# Patient Record
Sex: Male | Born: 1940 | Race: White | Hispanic: No | Smoking: Former smoker
Health system: Southern US, Community
[De-identification: ages and names within clinical notes are randomized; demographics above are authoritative.]

## PROBLEM LIST (undated history)

## (undated) ENCOUNTER — Emergency Department (HOSPITAL_COMMUNITY)

## (undated) DIAGNOSIS — E119 Type 2 diabetes mellitus without complications: Secondary | ICD-10-CM

## (undated) DIAGNOSIS — I219 Acute myocardial infarction, unspecified: Secondary | ICD-10-CM

## (undated) DIAGNOSIS — I214 Non-ST elevation (NSTEMI) myocardial infarction: Secondary | ICD-10-CM

## (undated) DIAGNOSIS — E7800 Pure hypercholesterolemia, unspecified: Secondary | ICD-10-CM

## (undated) DIAGNOSIS — I2510 Atherosclerotic heart disease of native coronary artery without angina pectoris: Secondary | ICD-10-CM

## (undated) DIAGNOSIS — Z950 Presence of cardiac pacemaker: Secondary | ICD-10-CM

## (undated) DIAGNOSIS — I10 Essential (primary) hypertension: Secondary | ICD-10-CM

## (undated) HISTORY — DX: Atherosclerotic heart disease of native coronary artery without angina pectoris: I25.10

## (undated) HISTORY — PX: COLONOSCOPY: SHX174

## (undated) HISTORY — DX: Non-ST elevation (NSTEMI) myocardial infarction: I21.4

## (undated) HISTORY — PX: CORONARY ANGIOPLASTY: SHX604

## (undated) HISTORY — PX: CORONARY ANGIOPLASTY WITH STENT PLACEMENT: SHX49

## (undated) HISTORY — PX: INSERT / REPLACE / REMOVE PACEMAKER: SUR710

---

## 2005-07-26 HISTORY — PX: CORONARY ARTERY BYPASS GRAFT: SHX141

## 2006-02-02 DIAGNOSIS — Z951 Presence of aortocoronary bypass graft: Secondary | ICD-10-CM | POA: Insufficient documentation

## 2009-11-20 DIAGNOSIS — Z950 Presence of cardiac pacemaker: Secondary | ICD-10-CM | POA: Insufficient documentation

## 2015-02-03 ENCOUNTER — Encounter (HOSPITAL_COMMUNITY): Payer: Self-pay

## 2015-02-03 ENCOUNTER — Inpatient Hospital Stay (HOSPITAL_COMMUNITY)
Admission: EM | Admit: 2015-02-03 | Discharge: 2015-02-06 | DRG: 251 | Disposition: A | Discharge status: Home / Self Care | Payer: Medicare Other | Attending: Internal Medicine | Admitting: Internal Medicine

## 2015-02-03 ENCOUNTER — Emergency Department (HOSPITAL_COMMUNITY): Payer: Medicare Other

## 2015-02-03 DIAGNOSIS — Z8679 Personal history of other diseases of the circulatory system: Secondary | ICD-10-CM

## 2015-02-03 DIAGNOSIS — E119 Type 2 diabetes mellitus without complications: Secondary | ICD-10-CM | POA: Diagnosis present

## 2015-02-03 DIAGNOSIS — I10 Essential (primary) hypertension: Secondary | ICD-10-CM | POA: Insufficient documentation

## 2015-02-03 DIAGNOSIS — Z955 Presence of coronary angioplasty implant and graft: Secondary | ICD-10-CM | POA: Diagnosis not present

## 2015-02-03 DIAGNOSIS — I480 Paroxysmal atrial fibrillation: Secondary | ICD-10-CM | POA: Diagnosis present

## 2015-02-03 DIAGNOSIS — Z9861 Coronary angioplasty status: Secondary | ICD-10-CM | POA: Diagnosis not present

## 2015-02-03 DIAGNOSIS — I495 Sick sinus syndrome: Secondary | ICD-10-CM

## 2015-02-03 DIAGNOSIS — I25810 Atherosclerosis of coronary artery bypass graft(s) without angina pectoris: Secondary | ICD-10-CM | POA: Diagnosis present

## 2015-02-03 DIAGNOSIS — I209 Angina pectoris, unspecified: Secondary | ICD-10-CM

## 2015-02-03 DIAGNOSIS — I2510 Atherosclerotic heart disease of native coronary artery without angina pectoris: Secondary | ICD-10-CM | POA: Diagnosis present

## 2015-02-03 DIAGNOSIS — E785 Hyperlipidemia, unspecified: Secondary | ICD-10-CM | POA: Diagnosis present

## 2015-02-03 DIAGNOSIS — Z950 Presence of cardiac pacemaker: Secondary | ICD-10-CM | POA: Diagnosis not present

## 2015-02-03 DIAGNOSIS — Z7902 Long term (current) use of antithrombotics/antiplatelets: Secondary | ICD-10-CM

## 2015-02-03 DIAGNOSIS — I214 Non-ST elevation (NSTEMI) myocardial infarction: Secondary | ICD-10-CM

## 2015-02-03 DIAGNOSIS — E782 Mixed hyperlipidemia: Secondary | ICD-10-CM | POA: Insufficient documentation

## 2015-02-03 DIAGNOSIS — Z87891 Personal history of nicotine dependence: Secondary | ICD-10-CM | POA: Diagnosis not present

## 2015-02-03 DIAGNOSIS — R079 Chest pain, unspecified: Secondary | ICD-10-CM | POA: Diagnosis present

## 2015-02-03 DIAGNOSIS — I2582 Chronic total occlusion of coronary artery: Secondary | ICD-10-CM | POA: Diagnosis present

## 2015-02-03 DIAGNOSIS — E118 Type 2 diabetes mellitus with unspecified complications: Secondary | ICD-10-CM | POA: Insufficient documentation

## 2015-02-03 DIAGNOSIS — Z7982 Long term (current) use of aspirin: Secondary | ICD-10-CM | POA: Diagnosis not present

## 2015-02-03 DIAGNOSIS — Z79899 Other long term (current) drug therapy: Secondary | ICD-10-CM

## 2015-02-03 HISTORY — DX: Essential (primary) hypertension: I10

## 2015-02-03 HISTORY — DX: Atherosclerotic heart disease of native coronary artery without angina pectoris: I25.10

## 2015-02-03 HISTORY — DX: Angina pectoris, unspecified: I20.9

## 2015-02-03 HISTORY — DX: Non-ST elevation (NSTEMI) myocardial infarction: I21.4

## 2015-02-03 LAB — GLUCOSE, CAPILLARY: Glucose-Capillary: 167 mg/dL — ABNORMAL HIGH (ref 65–99)

## 2015-02-03 LAB — I-STAT TROPONIN, ED: Troponin i, poc: 0.01 ng/mL (ref 0.00–0.08)

## 2015-02-03 LAB — BASIC METABOLIC PANEL
Anion gap: 12 (ref 5–15)
BUN: 21 mg/dL — ABNORMAL HIGH (ref 6–20)
CO2: 24 mmol/L (ref 22–32)
Calcium: 9.3 mg/dL (ref 8.9–10.3)
Chloride: 101 mmol/L (ref 101–111)
Creatinine, Ser: 1.14 mg/dL (ref 0.61–1.24)
GFR calc Af Amer: >60 mL/min (ref >60)
GFR calc non Af Amer: >60 mL/min (ref >60)
Glucose, Bld: 214 mg/dL — ABNORMAL HIGH (ref 65–99)
Potassium: 4.4 mmol/L (ref 3.5–5.1)
Sodium: 137 mmol/L (ref 135–145)

## 2015-02-03 LAB — CBC
HCT: 43.4 % (ref 39.0–52.0)
Hemoglobin: 15.3 g/dL (ref 13.0–17.0)
MCH: 32.8 pg (ref 26.0–34.0)
MCHC: 35.3 g/dL (ref 30.0–36.0)
MCV: 93.1 fL (ref 78.0–100.0)
Platelets: 123 10*3/uL — ABNORMAL LOW (ref 150–400)
RBC: 4.66 MIL/uL (ref 4.22–5.81)
RDW: 12.4 % (ref 11.5–15.5)
WBC: 5.3 10*3/uL (ref 4.0–10.5)

## 2015-02-03 LAB — PLATELET INHIBITION P2Y12: Platelet Function  P2Y12: 219 [PRU] (ref 194–418)

## 2015-02-03 LAB — MRSA PCR SCREENING: MRSA BY PCR: NEGATIVE

## 2015-02-03 LAB — TROPONIN I: Troponin I: 0.81 ng/mL (ref <0.031)

## 2015-02-03 MED ORDER — SODIUM CHLORIDE 0.9 % IV SOLN: 250.0000 mL | INTRAVENOUS | Status: DC | PRN

## 2015-02-03 MED ORDER — MORPHINE SULFATE 2 MG/ML IJ SOLN
1.0000 mg | INTRAMUSCULAR | Status: DC | PRN
Start: 2015-02-03 — End: 2015-02-06
  Administered 2015-02-03 – 2015-02-04 (×2): 2 mg via INTRAVENOUS
  Filled 2015-02-03 (×3): qty 1

## 2015-02-03 MED ORDER — CLOPIDOGREL BISULFATE 75 MG PO TABS
75.0000 mg | ORAL_TABLET | Freq: Every day | ORAL | Status: DC
  Administered 2015-02-03: 75 mg via ORAL
  Filled 2015-02-03 (×3): qty 1

## 2015-02-03 MED ORDER — ENALAPRIL MALEATE 2.5 MG PO TABS
2.5000 mg | ORAL_TABLET | Freq: Every day | ORAL | Status: DC
  Administered 2015-02-03 – 2015-02-05 (×2): 2.5 mg via ORAL
  Filled 2015-02-03 (×5): qty 1

## 2015-02-03 MED ORDER — SODIUM CHLORIDE 0.9 % IJ SOLN
3.0000 mL | Freq: Two times a day (BID) | INTRAMUSCULAR | Status: DC
  Administered 2015-02-03 – 2015-02-04 (×2): 3 mL via INTRAVENOUS

## 2015-02-03 MED ORDER — ENOXAPARIN SODIUM 40 MG/0.4ML ~~LOC~~ SOLN
40.0000 mg | SUBCUTANEOUS | Status: DC
  Filled 2015-02-03 (×2): qty 0.4

## 2015-02-03 MED ORDER — MORPHINE SULFATE 4 MG/ML IJ SOLN
4.0000 mg | Freq: Once | INTRAMUSCULAR | Status: AC
Start: 2015-02-03 — End: 2015-02-03
  Administered 2015-02-03: 4 mg via INTRAVENOUS
  Filled 2015-02-03: qty 1

## 2015-02-03 MED ORDER — ASPIRIN EC 325 MG PO TBEC
325.0000 mg | DELAYED_RELEASE_TABLET | Freq: Every day | ORAL | Status: DC
  Administered 2015-02-04: 325 mg via ORAL
  Filled 2015-02-03: qty 1

## 2015-02-03 MED ORDER — MORPHINE SULFATE 4 MG/ML IJ SOLN
4.0000 mg | Freq: Once | INTRAMUSCULAR | Status: AC
  Administered 2015-02-03: 4 mg via INTRAVENOUS
  Filled 2015-02-03: qty 1

## 2015-02-03 MED ORDER — INSULIN ASPART 100 UNIT/ML ~~LOC~~ SOLN
0.0000 [IU] | Freq: Three times a day (TID) | SUBCUTANEOUS | Status: DC
  Administered 2015-02-04: 3 [IU] via SUBCUTANEOUS
  Administered 2015-02-05 (×2): 2 [IU] via SUBCUTANEOUS

## 2015-02-03 MED ORDER — METOPROLOL TARTRATE 25 MG PO TABS
25.0000 mg | ORAL_TABLET | Freq: Three times a day (TID) | ORAL | Status: DC
  Administered 2015-02-03 – 2015-02-04 (×3): 25 mg via ORAL
  Filled 2015-02-03 (×5): qty 1

## 2015-02-03 MED ORDER — ASPIRIN 81 MG PO CHEW
324.0000 mg | CHEWABLE_TABLET | Freq: Once | ORAL | Status: AC
  Administered 2015-02-03: 324 mg via ORAL
  Filled 2015-02-03: qty 4

## 2015-02-03 MED ORDER — ENOXAPARIN SODIUM 100 MG/ML ~~LOC~~ SOLN
1.0000 mg/kg | Freq: Two times a day (BID) | SUBCUTANEOUS | Status: DC
  Administered 2015-02-03 – 2015-02-04 (×2): 90 mg via SUBCUTANEOUS
  Filled 2015-02-03 (×3): qty 1

## 2015-02-03 MED ORDER — SODIUM CHLORIDE 0.9 % WEIGHT BASED INFUSION: 1.0000 mL/kg/h | INTRAVENOUS | Status: DC

## 2015-02-03 MED ORDER — NITROGLYCERIN 0.4 MG SL SUBL
0.4000 mg | SUBLINGUAL_TABLET | SUBLINGUAL | Status: DC | PRN
  Administered 2015-02-03: 0.4 mg via SUBLINGUAL
  Filled 2015-02-03 (×2): qty 1

## 2015-02-03 MED ORDER — SODIUM CHLORIDE 0.9 % WEIGHT BASED INFUSION
3.0000 mL/kg/h | INTRAVENOUS | Status: DC
  Administered 2015-02-04: 3 mL/kg/h via INTRAVENOUS

## 2015-02-03 MED ORDER — SODIUM CHLORIDE 0.9 % IJ SOLN
3.0000 mL | INTRAMUSCULAR | Status: DC | PRN
Start: 2015-02-03 — End: 2015-02-04

## 2015-02-03 NOTE — H&P (Signed)
[facility] History and Physical  Nizar Cutler JOI:[id number] DOB: 1941/06/24 DOA: 02/03/2015  Referring physician: EDP PCP: No primary care provider on file.   Chief Complaint: chest pain  HPI: Jesse [name] is a 74 y.o. 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  HIS initial troponin negative and he was referred to medical service for admission.  Review of Systems:  Constitutional:  No weight loss, night sweats, Fevers, chills, fatigue.  HEENT:  No headaches, Difficulty swallowing,Tooth/dental problems,Sore throat,  No sneezing, itching, ear ache, nasal congestion, post nasal drip,  Cardio-vascular:  Chest pain earlier today , resolved spontaneously.  GI:  No heartburn, indigestion, abdominal pain, nausea, vomiting, diarrhea, change in bowel habits, loss of appetite  Resp:  No shortness of breath with exertion or at rest. No excess mucus, no productive cough, No non-productive cough, No coughing up of blood.No change in color of mucus.No wheezing.No chest wall deformity  Skin:  no rash or lesions.  GU:  no dysuria, change in color of urine, no urgency or frequency. No flank pain.  Musculoskeletal:  No joint pain or swelling. No decreased range of motion. No back pain.  Psych:  No change in mood or affect. No depression or anxiety. No memory loss.   Past Medical History  Diagnosis Date  . Hypertension   . Diabetes mellitus without complication    Past Surgical History  Procedure Laterality Date  . Triple bypass     . Coronary stent placement    . Pacemaker insertion     Social History:  reports that he has quit smoking. He does not have any smokeless tobacco history on file. He reports that he does not drink alcohol or use illicit drugs.  No Known Allergies  No family history on file.  No known family history.   Prior to  Admission medications   Medication Sig Start Date End Date Taking? Authorizing Provider  aspirin 325 MG EC tablet Take 325 mg by mouth daily.   Yes Historical Provider, MD  clopidogrel (PLAVIX) 75 MG tablet Take 75 mg by mouth at bedtime.   Yes Historical Provider, MD  enalapril (VASOTEC) 2.5 MG tablet Take 2.5 mg by mouth at bedtime.   Yes Historical Provider, MD  glimepiride (AMARYL) 2 MG tablet Take 2 mg by mouth 2 (two) times daily.   Yes Historical Provider, MD  metFORMIN (GLUCOPHAGE) 500 MG tablet Take 500 mg by mouth daily with breakfast.   Yes Historical Provider, MD  metoprolol tartrate (LOPRESSOR) 25 MG tablet Take 25 mg by mouth 3 (three) times daily.   Yes Historical Provider, MD  vitamin B-12 (CYANOCOBALAMIN) 1000 MCG tablet Take 1,000 mcg by mouth daily.   Yes Historical Provider, MD   Physical Exam: Filed Vitals:   02/03/15 1130 02/03/15 1230 02/03/15 1429 02/03/15 1458  BP:  108/64 102/68 122/70  Pulse:  64 64 68  Temp:   98.4 F (36.9 C) 97.8 F (36.6 C)  TempSrc:   Oral Oral  Resp:  21  18  Height: 6\' 2"  (1.88 m)     Weight: 90.719 kg (200 lb)     SpO2:  95% 100% 97%    Wt Readings from Last 3 Encounters:  02/03/15 90.719 kg (200 lb)    General:  Appears calm and comfortable Eyes: PERRL, normal lids, irises & conjunctiva ENT: grossly normal  hearing, lips & tongue Neck: no LAD, masses or thyromegaly Cardiovascular: RRR, no m/r/g. No LE edema. Telemetry: SR, no arrhythmias  Respiratory: CTA bilaterally, no w/r/r. Normal respiratory effort. Abdomen: soft, ntnd Skin: no rash or induration seen on limited exam Musculoskeletal: grossly normal tone BUE/BLE Psychiatric: grossly normal mood and affect, speech fluent and appropriate Neurologic: grossly non-focal.          Labs on Admission:  Basic Metabolic Panel:  Recent Labs Lab 02/03/15 1037  NA 137  K 4.4  CL 101  CO2 24  GLUCOSE 214*  BUN 21*  CREATININE 1.14  CALCIUM 9.3   Liver Function  Tests: No results for input(s): AST, ALT, ALKPHOS, BILITOT, PROT, ALBUMIN in the last 168 hours. No results for input(s): LIPASE, AMYLASE in the last 168 hours. No results for input(s): AMMONIA in the last 168 hours. CBC:  Recent Labs Lab 02/03/15 1037  WBC 5.3  HGB 15.3  HCT 43.4  MCV 93.1  PLT 123*   Cardiac Enzymes:  Recent Labs Lab 02/03/15 1615  TROPONINI 0.81*    BNP (last 3 results) No results for input(s): BNP in the last 8760 hours.  ProBNP (last 3 results) No results for input(s): PROBNP in the last 8760 hours.  CBG: No results for input(s): GLUCAP in the last 168 hours.  Radiological Exams on Admission: Dg Chest 2 View  02/03/2015   CLINICAL DATA:  Left-sided chest pain extending to the arm, shortness of breath, dizziness  EXAM: CHEST  2 VIEW  COMPARISON:  None.  FINDINGS: There is no focal parenchymal opacity. There is no pleural effusion or pneumothorax. The heart and mediastinal contours are unremarkable. There is evidence of prior CABG. There is a dual lead cardiac pacer.  The osseous structures are unremarkable.  IMPRESSION: No active cardiopulmonary disease.   Electronically Signed   By: Kathreen Devoid   On: 02/03/2015 11:23    EKG: reviewed , sinus with non specific t wave changes.   Assessment/Plan Active Problems:   Chest pain   NSTEMI: Transfer patient to [facility] stepdown.  Serial troponins, EKG in am, echocardiogram, . Cardiology consulted for possible cath in am.  Further recommendations as per cardiology.    Diabetes Mellitus: Start SSI.  HOLDING metformin and oral hypoglycemic meds.   Paroxysmal atrial fib, S/P PPM: Sinus.    Code Status: presumed full code.  DVT Prophylaxis: Family Communication: discussed with family at bedside.  Disposition Plan: transfer to [place] down.   Time spent: 55 min  [facility] Pager [phone]

## 2015-02-03 NOTE — ED Provider Notes (Signed)
CSN: [id number]     Arrival date & time 02/03/15  1002 History   First MD Initiated Contact with Patient 02/03/15 1039     Chief Complaint  Patient presents with  . Chest Pain     (Consider location/radiation/quality/duration/timing/severity/associated sxs/prior Treatment) HPI   74yM with CP. Onset this morning shortly after waking up. Pressure in the center of his chest and into L axilla. Associated with mild SOB. Hx of CAD. S/p CABG and stenting. Prior cardiac care has been in [place]. Has home there and in [place]. He reports he most recently had a stent placed just a few weeks ago. "One of my arteries was 99% blocked." Reports has been doing fairly will since this hospitalization up until these symptoms this morning. Currently still having some pain, but improved since onset.   Past Medical History  Diagnosis Date  . Hypertension   . Diabetes mellitus without complication    Past Surgical History  Procedure Laterality Date  . Triple bypass     . Coronary stent placement    . Pacemaker insertion     No family history on file. History  Substance Use Topics  . Smoking status: Former Research scientist (life sciences)  . Smokeless tobacco: Not on file  . Alcohol Use: No    Review of Systems  All systems reviewed and negative, other than as noted in HPI.   Allergies  Review of patient's allergies indicates no known allergies.  Home Medications   Prior to Admission medications   Medication Sig Start Date End Date Taking? Authorizing Provider  aspirin 325 MG EC tablet Take 325 mg by mouth daily.   Yes Historical Provider, MD  clopidogrel (PLAVIX) 75 MG tablet Take 75 mg by mouth at bedtime.   Yes Historical Provider, MD  enalapril (VASOTEC) 2.5 MG tablet Take 2.5 mg by mouth at bedtime.   Yes Historical Provider, MD  glimepiride (AMARYL) 2 MG tablet Take 2 mg by mouth 2 (two) times daily.   Yes Historical Provider, MD  metFORMIN (GLUCOPHAGE) 500 MG tablet Take 500 mg by mouth daily with breakfast.    Yes Historical Provider, MD  metoprolol tartrate (LOPRESSOR) 25 MG tablet Take 25 mg by mouth 3 (three) times daily.   Yes Historical Provider, MD  vitamin B-12 (CYANOCOBALAMIN) 1000 MCG tablet Take 1,000 mcg by mouth daily.   Yes Historical Provider, MD   BP 127/73 mmHg  Pulse 67  Temp(Src) 97.6 F (36.4 C) (Oral)  Resp 15  SpO2 99% Physical Exam  Constitutional: He appears well-developed and well-nourished. No distress.  HENT:  Head: Normocephalic and atraumatic.  Eyes: Conjunctivae are normal. Right eye exhibits no discharge. Left eye exhibits no discharge.  Neck: Neck supple.  Cardiovascular: Normal rate, regular rhythm and normal heart sounds.  Exam reveals no gallop and no friction rub.   No murmur heard. Pulmonary/Chest: Effort normal and breath sounds normal. No respiratory distress. He exhibits no tenderness.  Abdominal: Soft. He exhibits no distension. There is no tenderness.  Musculoskeletal: He exhibits no edema or tenderness.  Neurological: He is alert.  Skin: Skin is warm and dry.  Psychiatric: He has a normal mood and affect. His behavior is normal. Thought content normal.  Nursing note and vitals reviewed.   ED Course  Procedures (including critical care time) Labs Review Labs Reviewed  BASIC METABOLIC PANEL - Abnormal; Notable for the following:    Glucose, Bld 214 (*)    BUN 21 (*)    All other components within  normal limits  CBC - Abnormal; Notable for the following:    Platelets 123 (*)    All other components within normal limits  I-STAT TROPOININ, ED    Imaging Review Dg Chest 2 View  02/03/2015   CLINICAL DATA:  Left-sided chest pain extending to the arm, shortness of breath, dizziness  EXAM: CHEST  2 VIEW  COMPARISON:  None.  FINDINGS: There is no focal parenchymal opacity. There is no pleural effusion or pneumothorax. The heart and mediastinal contours are unremarkable. There is evidence of prior CABG. There is a dual lead cardiac pacer.  The  osseous structures are unremarkable.  IMPRESSION: No active cardiopulmonary disease.   Electronically Signed   By: Kathreen Devoid   On: 02/03/2015 11:23     EKG Interpretation   Date/Time:  Monday February 03 2015 10:20:22 EDT Ventricular Rate:  71 PR Interval:  182 QRS Duration: 152 QT Interval:  452 QTC Calculation: 491 R Axis:   -46 Text Interpretation:  Sinus rhythm Premature ventricular complexes No old  tracing to compare Confirmed by Daisia Slomski  MD, Karigan Cloninger ([id number]) on 02/03/2015  10:41:07 AM      MDM   Final diagnoses:  Chest pain, unspecified chest pain type    74yM with chest pain. Known CAD with several previous interventions. Pain resolved after a dose of nitro and morphine. Need to get records from [facility] in [place], [place]. Initial ED work-up fairly unremarkable, but with stent placement a few weeks ago and some typical symptoms, will admit for r/o.     Virgel Manifold, MD 02/08/15 [phone]

## 2015-02-03 NOTE — ED Notes (Signed)
Pt presents with c/o left side chest pain that radiates to his left arm. Pt reports that the pain started this morning, feels like pressure in nature. Pt has a cardiac hx of stent placement. Pt reporting some shortness of breath and dizziness with the pain as well.

## 2015-02-03 NOTE — Progress Notes (Addendum)
39 Return call from Attending who states she will enter order for admission status change [phone] CM paged Attending MD

## 2015-02-03 NOTE — Consult Note (Signed)
CARDIOLOGY CONSULT NOTE  Patient ID: Jesse [name] MRN: [id number] DOB/AGE: 1940/10/30 74 y.o.  Admit date: 02/03/2015 Referring Physician  Hosie Poisson, MD Primary Physician:  No primary care provider on file. Reason for Consultation  Chest pain  HPI: Jesse [name]  is a 74 y.o. male  With history of CAD and history of CABG in 2007, 3 and history of  PTCA post CABG, a total of 6 stents appears to have stent 4 to SVG to RCA, 2 stents in native vessels and latest stent was on 01/10/2015, [place] and underwent angioplasty to the right coronary artery vein graft due to ACS. Patient also states that he has 2 other stents in his native vessels. He states that all his grafts are patent including the stents placed previously. He is unable to give me exact details of the stent.  Patient also has history of sick sinus syndrome and has Medtronic permanent transvenous pacemaker implantation. He has well-controlled diabetes mellitus, hypertension and hyperlipidemia.  He presented with chest discomfort in the form of tightness this morning with radiation to the left axilla, waxing and waning, associated with diaphoresis. He also has noticed worsening shortness of breath and dyspnea on exertion over the past 6 weeks. Since being in the hospital, he continues to have mild chest discomfort but states that at this point it is completely relieved. His family members are at the bedside.  He denies symptoms of claudication, no history of TIA in the past, no recent weight changes. He is tolerating all his medications well and denies any symptoms of GI bleed.    Past Medical History  Diagnosis Date  . Hypertension   . Diabetes mellitus without complication      Past Surgical History  Procedure Laterality Date  . Triple bypass     . Coronary stent placement    . Pacemaker insertion       No family history on file.   Social History: History   Social History  . Marital Status: Married    Spouse  Name: N/A  . Number of Children: N/A  . Years of Education: N/A   Occupational History  . Not on file.   Social History Main Topics  . Smoking status: Former Research scientist (life sciences)  . Smokeless tobacco: Not on file  . Alcohol Use: No  . Drug Use: No  . Sexual Activity: Not on file   Other Topics Concern  . Not on file   Social History Narrative  . No narrative on file     Prescriptions prior to admission  Medication Sig Dispense Refill Last Dose  . aspirin 325 MG EC tablet Take 325 mg by mouth daily.   02/03/2015 at Unknown time  . clopidogrel (PLAVIX) 75 MG tablet Take 75 mg by mouth at bedtime.   02/02/2015 at Unknown time  . enalapril (VASOTEC) 2.5 MG tablet Take 2.5 mg by mouth at bedtime.   02/02/2015 at Unknown time  . glimepiride (AMARYL) 2 MG tablet Take 2 mg by mouth 2 (two) times daily.   02/03/2015 at 0700  . metFORMIN (GLUCOPHAGE) 500 MG tablet Take 500 mg by mouth daily with breakfast.   02/03/2015 at Unknown time  . metoprolol tartrate (LOPRESSOR) 25 MG tablet Take 25 mg by mouth 3 (three) times daily.   02/03/2015 at 0700  . vitamin B-12 (CYANOCOBALAMIN) 1000 MCG tablet Take 1,000 mcg by mouth daily.   02/03/2015 at Unknown time     ROS: General: no fevers/chills/night sweats Eyes: no blurry  vision, diplopia, or amaurosis ENT: no sore throat or hearing loss Resp: c/o dyspnea on exertion.  no cough, wheezing, or hemoptysis CV: no edema or palpitations GI: no abdominal pain, nausea, vomiting, diarrhea, or constipation GU: no dysuria, frequency, or hematuria Skin: no rash Neuro: no headache, numbness, tingling, or weakness of extremities Musculoskeletal: no joint pain or swelling Heme: no bleeding, DVT, or easy bruising Endo: no polydipsia or polyuria    Physical Exam: Blood pressure 122/70, pulse 68, temperature 97.8 F (36.6 C), temperature source Oral, resp. rate 18, height 6\' 2"  (1.88 m), weight 90.719 kg (200 lb), SpO2 97 %.   General appearance: alert, cooperative,  appears stated age and no distress Lungs: clear to auscultation bilaterally Chest wall: no tenderness  Cardiac exam: Distant heart sounds. There is a 2/6 long systolic murmur at the apex conducted to the axilla. Abdominal exam: There is no organomegaly, soft, bowel sounds heard in all 4 quadrants, no tenderness. Neurologic: Grossly normal. Alert and oriented 3. Extremity: Full range of movements, no edema. Vascular exam: Soft left carotid bruit. To 3+ femoral pulses without bruit, popliteal pulse 2+ bilateral, pedal +2+ bilateral.   Labs:   Lab Results  Component Value Date   WBC 5.3 02/03/2015   HGB 15.3 02/03/2015   HCT 43.4 02/03/2015   MCV 93.1 02/03/2015   PLT 123* 02/03/2015    Recent Labs Lab 02/03/15 1037  NA 137  K 4.4  CL 101  CO2 24  BUN 21*  CREATININE 1.14  CALCIUM 9.3  GLUCOSE 214*    Recent Labs  02/03/15 1615  TROPONINI 0.81*    Lab Results  Component Value Date   TROPONINI 0.81* 02/03/2015     TSH No results for input(s): TSH in the last 8760 hours.  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 PVC.  Echo: Pending   Radiology: Dg Chest 2 View  02/03/2015   CLINICAL DATA:  Left-sided chest pain extending to the arm, shortness of breath, dizziness  EXAM: CHEST  2 VIEW  COMPARISON:  None.  FINDINGS: There is no focal parenchymal opacity. There is no pleural effusion or pneumothorax. The heart and mediastinal contours are unremarkable. There is evidence of prior CABG. There is a dual lead cardiac pacer.  The osseous structures are unremarkable.  IMPRESSION: No active cardiopulmonary disease.   Electronically Signed   By: Kathreen Devoid   On: 02/03/2015 11:23    Scheduled Meds: . Derrill Memo ON 02/04/2015] aspirin  325 mg Oral Daily  . clopidogrel  75 mg Oral QHS  . enalapril  2.5 mg Oral QHS  . enoxaparin (LOVENOX) injection  40 mg Subcutaneous Q24H  . metoprolol tartrate  25 mg Oral  TID   Continuous Infusions:  PRN Meds:.morphine injection, nitroGLYCERIN  ASSESSMENT AND PLAN:  1. NSTEMI, elevated serum troponin by serial marker, chest pain symptoms very suggestive of unstable angina pectoris. 2.  CAD and history of CABG in 2007, 3 and history of  PTCA post CABG, a total of 6 stents appears to have stent 4 to SVG to RCA, 2 stents in native vessels and latest stent was on 01/10/2015, [place], [place]  3. Diabetes mellitus type 2 controlled 4. Hypertension 5. Hyperlipidemia 6. 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 7. 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  Recommendation: Patient's symptoms are very classic for recurrence of unstable angina. He will need repeat coronary angiography. I will  transfer the [facility] under stepdown unit, patient has on and off chest discomfort. We will continue to trend his cardiac markers. I'll obtain lipid profile testing in the morning. Follow-up on echocardiogram. Discussed risks, benefits and alternatives of angiogram including but not limited to <1% risk of death, stroke, MI, need for urgent surgical revascularization, renal failure, but not limited to thest. patient is willing to proceed. Start full dose Lovenox, check P2Y12 receptor response as patient is on chronic Plavix.  Adrian Prows, MD 02/03/2015, 7:11 PM [facility]. [place] Pager: [phone] Office: [phone] If no answer Cell [phone]

## 2015-02-03 NOTE — Progress Notes (Signed)
CRITICAL VALUE ALERT  Critical value received:  Troponin 0.81  Date of notification:  02/03/2015  Time of notification:  1700  Critical value read back:Yes.    Nurse who received alert: Alvester Chou RN  MD notified (1st page):  Yes   Time of first page:  1703  MD notified (2nd page):  Time of second page:  Responding MD:  Dr. Karleen Hampshire  Time MD responded:  [phone]

## 2015-02-03 NOTE — Progress Notes (Signed)
ANTICOAGULATION CONSULT NOTE - Initial Consult  Pharmacy Consult for lovenox Indication: chest pain/ACS  No Known Allergies  Patient Measurements: Height: 6\' 2"  (188 cm) Weight: 200 lb (90.719 kg) IBW/kg (Calculated) : 82.2 Heparin Dosing Weight: 90.7 kg  Vital Signs: Temp: 97.7 F (36.5 C) (07/11 2015) Temp Source: Oral (07/11 2015) BP: 114/62 mmHg (07/11 2015) Pulse Rate: 63 (07/11 2015)  Labs:  Recent Labs  02/03/15 1037 02/03/15 1615  HGB 15.3  --   HCT 43.4  --   PLT 123*  --   CREATININE 1.14  --   TROPONINI  --  0.81*    Estimated Creatinine Clearance: 65.1 mL/min (by C-G formula based on Cr of 1.14).   Medical History: Past Medical History  Diagnosis Date  . Hypertension   . Diabetes mellitus without complication     Medications:  Prescriptions prior to admission  Medication Sig Dispense Refill Last Dose  . aspirin 325 MG EC tablet Take 325 mg by mouth daily.   02/03/2015 at Unknown time  . clopidogrel (PLAVIX) 75 MG tablet Take 75 mg by mouth at bedtime.   02/02/2015 at Unknown time  . enalapril (VASOTEC) 2.5 MG tablet Take 2.5 mg by mouth at bedtime.   02/02/2015 at Unknown time  . glimepiride (AMARYL) 2 MG tablet Take 2 mg by mouth 2 (two) times daily.   02/03/2015 at 0700  . metFORMIN (GLUCOPHAGE) 500 MG tablet Take 500 mg by mouth daily with breakfast.   02/03/2015 at Unknown time  . metoprolol tartrate (LOPRESSOR) 25 MG tablet Take 25 mg by mouth 3 (three) times daily.   02/03/2015 at 0700  . vitamin B-12 (CYANOCOBALAMIN) 1000 MCG tablet Take 1,000 mcg by mouth daily.   02/03/2015 at Unknown time    Assessment: 74 yo man to start lovenox for ACS.  His baseline Kg is 15.3, PTLC 123.  Trop is 0.81, CrCl ~65 ml/min Goal of Therapy:  Anti-Xa level 0.6-1 units/ml 4hrs after LMWH dose given Monitor platelets by anticoagulation protocol: Yes   Plan:  Lovenox 90 mg sq q12 hours Check CBC q 3 days while on lovenox   Alexis Reber Poteet 02/03/2015,9:05  PM

## 2015-02-04 ENCOUNTER — Inpatient Hospital Stay (HOSPITAL_COMMUNITY): Payer: Medicare Other

## 2015-02-04 ENCOUNTER — Encounter (HOSPITAL_COMMUNITY)
Admission: EM | Disposition: A | Discharge status: Home / Self Care | Payer: Medicare Other | Source: Home / Self Care | Attending: Internal Medicine

## 2015-02-04 DIAGNOSIS — I214 Non-ST elevation (NSTEMI) myocardial infarction: Principal | ICD-10-CM

## 2015-02-04 HISTORY — PX: CARDIAC CATHETERIZATION: SHX172

## 2015-02-04 LAB — CBC WITH DIFFERENTIAL/PLATELET
BASOS ABS: 0 10*3/uL (ref 0.0–0.1)
BASOS PCT: 0 % (ref 0–1)
EOS ABS: 0 10*3/uL (ref 0.0–0.7)
EOS PCT: 0 % (ref 0–5)
HCT: 39.2 % (ref 39.0–52.0)
HEMOGLOBIN: 13.6 g/dL (ref 13.0–17.0)
LYMPHS PCT: 18 % (ref 12–46)
Lymphs Abs: 1.2 10*3/uL (ref 0.7–4.0)
MCH: 32.5 pg (ref 26.0–34.0)
MCHC: 34.7 g/dL (ref 30.0–36.0)
MCV: 93.6 fL (ref 78.0–100.0)
Monocytes Absolute: 1 10*3/uL (ref 0.1–1.0)
Monocytes Relative: 16 % — ABNORMAL HIGH (ref 3–12)
NEUTROS ABS: 4.1 10*3/uL (ref 1.7–7.7)
Neutrophils Relative %: 65 % (ref 43–77)
PLATELETS: 112 10*3/uL — AB (ref 150–400)
RBC: 4.19 MIL/uL — ABNORMAL LOW (ref 4.22–5.81)
RDW: 12.3 % (ref 11.5–15.5)
WBC: 6.2 10*3/uL (ref 4.0–10.5)

## 2015-02-04 LAB — TROPONIN I
TROPONIN I: 5.03 ng/mL — AB (ref <0.031)
TROPONIN I: 8.93 ng/mL — AB (ref <0.031)
Troponin I: 4.53 ng/mL (ref <0.031)

## 2015-02-04 LAB — COMPREHENSIVE METABOLIC PANEL
ALBUMIN: 3.5 g/dL (ref 3.5–5.0)
ALT: 28 U/L (ref 17–63)
AST: 50 U/L — AB (ref 15–41)
Alkaline Phosphatase: 54 U/L (ref 38–126)
Anion gap: 8 (ref 5–15)
BUN: 21 mg/dL — ABNORMAL HIGH (ref 6–20)
CALCIUM: 9 mg/dL (ref 8.9–10.3)
CHLORIDE: 106 mmol/L (ref 101–111)
CO2: 23 mmol/L (ref 22–32)
Creatinine, Ser: 1.02 mg/dL (ref 0.61–1.24)
GFR calc Af Amer: >60 mL/min (ref >60)
GFR calc non Af Amer: >60 mL/min (ref >60)
GLUCOSE: 169 mg/dL — AB (ref 65–99)
Potassium: 4.4 mmol/L (ref 3.5–5.1)
SODIUM: 137 mmol/L (ref 135–145)
TOTAL PROTEIN: 6.1 g/dL — AB (ref 6.5–8.1)
Total Bilirubin: 0.6 mg/dL (ref 0.3–1.2)

## 2015-02-04 LAB — PROTIME-INR
INR: 1.26 (ref 0.00–1.49)
PROTHROMBIN TIME: 16 s — AB (ref 11.6–15.2)

## 2015-02-04 LAB — MAGNESIUM: Magnesium: 2.3 mg/dL (ref 1.7–2.4)

## 2015-02-04 LAB — GLUCOSE, CAPILLARY
GLUCOSE-CAPILLARY: 163 mg/dL — AB (ref 65–99)
GLUCOSE-CAPILLARY: 115 mg/dL — AB (ref 65–99)
GLUCOSE-CAPILLARY: 205 mg/dL — AB (ref 65–99)
Glucose-Capillary: 131 mg/dL — ABNORMAL HIGH (ref 65–99)

## 2015-02-04 LAB — POCT ACTIVATED CLOTTING TIME: Activated Clotting Time: 564 seconds

## 2015-02-04 SURGERY — LEFT HEART CATH AND CORS/GRAFTS ANGIOGRAPHY

## 2015-02-04 MED ORDER — BIVALIRUDIN BOLUS VIA INFUSION - CUPID
INTRAVENOUS | Status: DC | PRN
  Administered 2015-02-04: 68.025 mg via INTRAVENOUS

## 2015-02-04 MED ORDER — FENTANYL CITRATE (PF) 100 MCG/2ML IJ SOLN
INTRAMUSCULAR | Status: AC
  Filled 2015-02-04: qty 2

## 2015-02-04 MED ORDER — HEPARIN (PORCINE) IN NACL 2-0.9 UNIT/ML-% IJ SOLN
INTRAMUSCULAR | Status: AC
  Filled 2015-02-04: qty 1000

## 2015-02-04 MED ORDER — SODIUM CHLORIDE 0.9 % IJ SOLN
3.0000 mL | Freq: Two times a day (BID) | INTRAMUSCULAR | Status: DC
  Administered 2015-02-04 – 2015-02-05 (×2): 3 mL via INTRAVENOUS

## 2015-02-04 MED ORDER — NITROGLYCERIN 1 MG/10 ML FOR IR/CATH LAB
INTRA_ARTERIAL | Status: DC | PRN
  Administered 2015-02-04: 16:00:00

## 2015-02-04 MED ORDER — SODIUM CHLORIDE 0.9 % IJ SOLN: 3.0000 mL | INTRAMUSCULAR | Status: DC | PRN

## 2015-02-04 MED ORDER — LIDOCAINE HCL (PF) 1 % IJ SOLN
INTRAMUSCULAR | Status: AC
  Filled 2015-02-04: qty 30

## 2015-02-04 MED ORDER — SODIUM CHLORIDE 0.9 % IV SOLN
INTRAVENOUS | Status: DC | PRN
  Administered 2015-02-04: 500 mL via INTRAVENOUS

## 2015-02-04 MED ORDER — IOHEXOL 350 MG/ML SOLN
INTRAVENOUS | Status: DC | PRN
  Administered 2015-02-04: 125 mL via INTRAVENOUS
  Administered 2015-02-04: 100 mL via INTRAVENOUS

## 2015-02-04 MED ORDER — ACETAMINOPHEN 325 MG PO TABS: 650.0000 mg | ORAL_TABLET | ORAL | Status: DC | PRN

## 2015-02-04 MED ORDER — BIVALIRUDIN 250 MG IV SOLR
INTRAVENOUS | Status: AC
  Filled 2015-02-04: qty 250

## 2015-02-04 MED ORDER — ENOXAPARIN SODIUM 40 MG/0.4ML ~~LOC~~ SOLN
40.0000 mg | SUBCUTANEOUS | Status: DC
  Administered 2015-02-05 – 2015-02-06 (×2): 40 mg via SUBCUTANEOUS
  Filled 2015-02-04 (×3): qty 0.4

## 2015-02-04 MED ORDER — SODIUM CHLORIDE 0.9 % IV SOLN
250.0000 mg | INTRAVENOUS | Status: DC | PRN
  Administered 2015-02-04: 1.75 mg/kg/h via INTRAVENOUS

## 2015-02-04 MED ORDER — RADIAL COCKTAIL (HEPARIN/VERAPAMIL/LIDOCAINE/NITRO)
Status: DC | PRN
  Administered 2015-02-04: 1 via INTRA_ARTERIAL

## 2015-02-04 MED ORDER — SODIUM CHLORIDE 0.9 % IV SOLN: 250.0000 mL | INTRAVENOUS | Status: DC | PRN

## 2015-02-04 MED ORDER — MIDAZOLAM HCL 2 MG/2ML IJ SOLN
INTRAMUSCULAR | Status: AC
  Filled 2015-02-04: qty 2

## 2015-02-04 MED ORDER — MIDAZOLAM HCL 2 MG/2ML IJ SOLN
INTRAMUSCULAR | Status: DC | PRN
  Administered 2015-02-04: 2 mg via INTRAVENOUS

## 2015-02-04 MED ORDER — SODIUM CHLORIDE 0.9 % WEIGHT BASED INFUSION
3.0000 mL/kg/h | INTRAVENOUS | Status: AC
  Administered 2015-02-04: 3 mL/kg/h via INTRAVENOUS

## 2015-02-04 MED ORDER — PERFLUTREN LIPID MICROSPHERE
1.0000 mL | INTRAVENOUS | Status: AC | PRN
  Administered 2015-02-04: 2 mL via INTRAVENOUS
  Filled 2015-02-04: qty 10

## 2015-02-04 MED ORDER — ALPRAZOLAM 0.5 MG PO TABS
1.0000 mg | ORAL_TABLET | Freq: Two times a day (BID) | ORAL | Status: DC | PRN
  Administered 2015-02-05 (×2): 1 mg via ORAL
  Filled 2015-02-04 (×2): qty 2

## 2015-02-04 MED ORDER — ONDANSETRON HCL 4 MG/2ML IJ SOLN: 4.0000 mg | Freq: Four times a day (QID) | INTRAMUSCULAR | Status: DC | PRN

## 2015-02-04 MED ORDER — ASPIRIN 81 MG PO CHEW
81.0000 mg | CHEWABLE_TABLET | Freq: Every day | ORAL | Status: DC
  Administered 2015-02-05 – 2015-02-06 (×2): 81 mg via ORAL
  Filled 2015-02-04 (×2): qty 1

## 2015-02-04 MED ORDER — SODIUM CHLORIDE 0.9 % IV SOLN
0.2500 mg/kg/h | INTRAVENOUS | Status: DC
  Filled 2015-02-04: qty 250

## 2015-02-04 MED ORDER — TICAGRELOR 90 MG PO TABS
ORAL_TABLET | ORAL | Status: AC
  Filled 2015-02-04: qty 2

## 2015-02-04 MED ORDER — METOPROLOL TARTRATE 12.5 MG HALF TABLET
12.5000 mg | ORAL_TABLET | Freq: Three times a day (TID) | ORAL | Status: DC
Start: 2015-02-04 — End: 2015-02-06
  Administered 2015-02-04 – 2015-02-06 (×3): 12.5 mg via ORAL
  Filled 2015-02-04 (×7): qty 1

## 2015-02-04 MED ORDER — VERAPAMIL HCL 2.5 MG/ML IV SOLN
INTRAVENOUS | Status: AC
  Filled 2015-02-04: qty 2

## 2015-02-04 MED ORDER — TICAGRELOR 90 MG PO TABS
90.0000 mg | ORAL_TABLET | Freq: Two times a day (BID) | ORAL | Status: DC
  Administered 2015-02-04 – 2015-02-06 (×4): 90 mg via ORAL
  Filled 2015-02-04 (×5): qty 1

## 2015-02-04 MED ORDER — NITROGLYCERIN 1 MG/10 ML FOR IR/CATH LAB
INTRA_ARTERIAL | Status: AC
  Filled 2015-02-04: qty 10

## 2015-02-04 MED ORDER — LIDOCAINE HCL (PF) 1 % IJ SOLN
INTRAMUSCULAR | Status: DC | PRN
  Administered 2015-02-04: 2 mL

## 2015-02-04 MED ORDER — BIVALIRUDIN BOLUS VIA INFUSION
0.1000 mg/kg | Freq: Once | INTRAVENOUS | Status: DC
  Filled 2015-02-04: qty 10

## 2015-02-04 MED ORDER — ATORVASTATIN CALCIUM 80 MG PO TABS
80.0000 mg | ORAL_TABLET | Freq: Every day | ORAL | Status: DC
  Administered 2015-02-04 – 2015-02-05 (×2): 80 mg via ORAL
  Filled 2015-02-04 (×3): qty 1

## 2015-02-04 MED ORDER — HEPARIN SODIUM (PORCINE) 1000 UNIT/ML IJ SOLN
INTRAMUSCULAR | Status: AC
  Filled 2015-02-04: qty 1

## 2015-02-04 MED ORDER — FENTANYL CITRATE (PF) 100 MCG/2ML IJ SOLN
INTRAMUSCULAR | Status: DC | PRN
  Administered 2015-02-04: 25 ug via INTRAVENOUS

## 2015-02-04 MED ORDER — TICAGRELOR 90 MG PO TABS
ORAL_TABLET | ORAL | Status: DC | PRN
  Administered 2015-02-04: 180 mg via ORAL

## 2015-02-04 SURGICAL SUPPLY — 21 items
BALLN EUPHORA RX 4.0X25 (BALLOONS) ×2
BALLN TREK RX 2.5X15 (BALLOONS) ×2
BALLOON EUPHORA RX 4.0X25 (BALLOONS) ×1 IMPLANT
BALLOON TREK RX 2.5X15 (BALLOONS) ×1 IMPLANT
CATH EXTRAC PRONTO LP 6F RND (CATHETERS) ×2 IMPLANT
CATH INFINITI 5 FR AR2 MOD (CATHETERS) ×2 IMPLANT
CATH INFINITI 5 FR RCB (CATHETERS) ×2 IMPLANT
CATH INFINITI 5FR AL1 (CATHETERS) ×2 IMPLANT
CATH OPTITORQUE TIG 4.0 5F (CATHETERS) ×2 IMPLANT
CATH SITESEER 5F MULTI A 2 (CATHETERS) ×2 IMPLANT
DEVICE RAD COMP TR BAND LRG (VASCULAR PRODUCTS) ×2 IMPLANT
GLIDESHEATH SLEND A-KIT 6F 20G (SHEATH) ×2 IMPLANT
GUIDE CATH RUNWAY 6FR HS (CATHETERS) ×2 IMPLANT
KIT ENCORE 26 ADVANTAGE (KITS) ×2 IMPLANT
KIT HEART LEFT (KITS) ×2 IMPLANT
PACK CARDIAC CATHETERIZATION (CUSTOM PROCEDURE TRAY) ×2 IMPLANT
TRANSDUCER W/STOPCOCK (MISCELLANEOUS) ×2 IMPLANT
TUBING CIL FLEX 10 FLL-RA (TUBING) ×2 IMPLANT
WIRE COUGAR XT STRL 190CM (WIRE) ×2 IMPLANT
WIRE HI TORQ VERSACORE J 260CM (WIRE) ×2 IMPLANT
WIRE SAFE-T 1.5MM-J .035X260CM (WIRE) ×2 IMPLANT

## 2015-02-04 NOTE — Interval H&P Note (Signed)
History and Physical Interval Note:  02/04/2015 2:26 PM  Jesse [name]  has presented today for surgery, with the diagnosis of CP  The various methods of treatment have been discussed with the patient and family. After consideration of risks, benefits and other options for treatment, the patient has consented to  Procedure(s): Left Heart Cath and Cors/Grafts Angiography (N/A) and possible PCI as a surgical intervention .  The patient's history has been reviewed, patient examined, no change in status, stable for surgery.  I have reviewed the patient's chart and labs.  Questions were answered to the patient's satisfaction.   Cath Lab Visit (complete for each Cath Lab visit)  Clinical Evaluation Leading to the Procedure:   ACS: Yes.    Non-ACS:    Anginal Classification: CCS IV  Anti-ischemic medical therapy: Maximal Therapy (2 or more classes of medications)  Non-Invasive Test Results: No non-invasive testing performed  Prior CABG: Previous CABG        Adrian Prows

## 2015-02-04 NOTE — Progress Notes (Signed)
  Echocardiogram 2D Echocardiogram has been performed.  Darlina Sicilian M 02/04/2015, 8:42 AM

## 2015-02-04 NOTE — Progress Notes (Signed)
[name] HBZ:[id number] DOB: [birth year] DOA: 02/03/2015 PCP: No primary care provider on file.   Subj: Jesse [name] is a 74 y.o. 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 HIS initial troponin negative and he was referred to medical service for admission.    Obj: 7/12 patient was taken to the cardiac catheterization lab unavailable for evaluation. Objective: VITAL SIGNS: Temp: 98.5 F (36.9 C) (07/12 1930) Temp Source: Oral (07/12 1930) BP: 95/53 mmHg (07/12 2000) Pulse Rate: 64 (07/12 2000) SPO2; FIO2:   Intake/Output Summary (Last 24 hours) at 02/04/15 2117 Last data filed at 02/04/15 2100  Gross per 24 hour  Intake 1813.7 ml  Output    900 ml  Net  913.7 ml     Exam: General: Not Evaluated Lungs: Not Evaluated Cardiovascular: Not Evaluated Abdomen:Not Evaluated Extremities: Not Evaluated   Procedure/Significant Events:    Culture NA  Antibiotics: NA   A/P ACS; patient was taken this morning to catheterization lab unavailable for evaluation. No charge         Care during the described time interval was provided by me .  I have reviewed this patient's available data, including medical history, events of note, physical examination, and all test results as part of my evaluation. I have personally reviewed and interpreted all radiology studies.

## 2015-02-04 NOTE — Progress Notes (Signed)
Utilization Review Completed.Jesse [name] T7/06/2015  

## 2015-02-04 NOTE — H&P (View-Only) (Signed)
CARDIOLOGY CONSULT NOTE  Patient ID: Jesse [name] MRN: [id number] DOB/AGE: 01-16-1941 74 y.o.  Admit date: 02/03/2015 Referring Physician  Hosie Poisson, MD Primary Physician:  No primary care provider on file. Reason for Consultation  Chest pain  HPI: Jesse [name]  is a 74 y.o. male  With history of CAD and history of CABG in 2007, 3 and history of  PTCA post CABG, a total of 6 stents appears to have stent 4 to SVG to RCA, 2 stents in native vessels and latest stent was on 01/10/2015, [place] and underwent angioplasty to the right coronary artery vein graft due to ACS. Patient also states that he has 2 other stents in his native vessels. He states that all his grafts are patent including the stents placed previously. He is unable to give me exact details of the stent.  Patient also has history of sick sinus syndrome and has Medtronic permanent transvenous pacemaker implantation. He has well-controlled diabetes mellitus, hypertension and hyperlipidemia.  He presented with chest discomfort in the form of tightness this morning with radiation to the left axilla, waxing and waning, associated with diaphoresis. He also has noticed worsening shortness of breath and dyspnea on exertion over the past 6 weeks. Since being in the hospital, he continues to have mild chest discomfort but states that at this point it is completely relieved. His family members are at the bedside.  He denies symptoms of claudication, no history of TIA in the past, no recent weight changes. He is tolerating all his medications well and denies any symptoms of GI bleed.    Past Medical History  Diagnosis Date  . Hypertension   . Diabetes mellitus without complication      Past Surgical History  Procedure Laterality Date  . Triple bypass     . Coronary stent placement    . Pacemaker insertion       No family history on file.   Social History: History   Social History  . Marital Status: Married    Spouse  Name: N/A  . Number of Children: N/A  . Years of Education: N/A   Occupational History  . Not on file.   Social History Main Topics  . Smoking status: Former Research scientist (life sciences)  . Smokeless tobacco: Not on file  . Alcohol Use: No  . Drug Use: No  . Sexual Activity: Not on file   Other Topics Concern  . Not on file   Social History Narrative  . No narrative on file     Prescriptions prior to admission  Medication Sig Dispense Refill Last Dose  . aspirin 325 MG EC tablet Take 325 mg by mouth daily.   02/03/2015 at Unknown time  . clopidogrel (PLAVIX) 75 MG tablet Take 75 mg by mouth at bedtime.   02/02/2015 at Unknown time  . enalapril (VASOTEC) 2.5 MG tablet Take 2.5 mg by mouth at bedtime.   02/02/2015 at Unknown time  . glimepiride (AMARYL) 2 MG tablet Take 2 mg by mouth 2 (two) times daily.   02/03/2015 at 0700  . metFORMIN (GLUCOPHAGE) 500 MG tablet Take 500 mg by mouth daily with breakfast.   02/03/2015 at Unknown time  . metoprolol tartrate (LOPRESSOR) 25 MG tablet Take 25 mg by mouth 3 (three) times daily.   02/03/2015 at 0700  . vitamin B-12 (CYANOCOBALAMIN) 1000 MCG tablet Take 1,000 mcg by mouth daily.   02/03/2015 at Unknown time     ROS: General: no fevers/chills/night sweats Eyes: no blurry  vision, diplopia, or amaurosis ENT: no sore throat or hearing loss Resp: c/o dyspnea on exertion.  no cough, wheezing, or hemoptysis CV: no edema or palpitations GI: no abdominal pain, nausea, vomiting, diarrhea, or constipation GU: no dysuria, frequency, or hematuria Skin: no rash Neuro: no headache, numbness, tingling, or weakness of extremities Musculoskeletal: no joint pain or swelling Heme: no bleeding, DVT, or easy bruising Endo: no polydipsia or polyuria    Physical Exam: Blood pressure 122/70, pulse 68, temperature 97.8 F (36.6 C), temperature source Oral, resp. rate 18, height 6\' 2"  (1.88 m), weight 90.719 kg (200 lb), SpO2 97 %.   General appearance: alert, cooperative,  appears stated age and no distress Lungs: clear to auscultation bilaterally Chest wall: no tenderness  Cardiac exam: Distant heart sounds. There is a 2/6 long systolic murmur at the apex conducted to the axilla. Abdominal exam: There is no organomegaly, soft, bowel sounds heard in all 4 quadrants, no tenderness. Neurologic: Grossly normal. Alert and oriented 3. Extremity: Full range of movements, no edema. Vascular exam: Soft left carotid bruit. To 3+ femoral pulses without bruit, popliteal pulse 2+ bilateral, pedal +2+ bilateral.   Labs:   Lab Results  Component Value Date   WBC 5.3 02/03/2015   HGB 15.3 02/03/2015   HCT 43.4 02/03/2015   MCV 93.1 02/03/2015   PLT 123* 02/03/2015    Recent Labs Lab 02/03/15 1037  NA 137  K 4.4  CL 101  CO2 24  BUN 21*  CREATININE 1.14  CALCIUM 9.3  GLUCOSE 214*    Recent Labs  02/03/15 1615  TROPONINI 0.81*    Lab Results  Component Value Date   TROPONINI 0.81* 02/03/2015     TSH No results for input(s): TSH in the last 8760 hours.  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 PVC.  Echo: Pending   Radiology: Dg Chest 2 View  02/03/2015   CLINICAL DATA:  Left-sided chest pain extending to the arm, shortness of breath, dizziness  EXAM: CHEST  2 VIEW  COMPARISON:  None.  FINDINGS: There is no focal parenchymal opacity. There is no pleural effusion or pneumothorax. The heart and mediastinal contours are unremarkable. There is evidence of prior CABG. There is a dual lead cardiac pacer.  The osseous structures are unremarkable.  IMPRESSION: No active cardiopulmonary disease.   Electronically Signed   By: Kathreen Devoid   On: 02/03/2015 11:23    Scheduled Meds: . Derrill Memo ON 02/04/2015] aspirin  325 mg Oral Daily  . clopidogrel  75 mg Oral QHS  . enalapril  2.5 mg Oral QHS  . enoxaparin (LOVENOX) injection  40 mg Subcutaneous Q24H  . metoprolol tartrate  25 mg Oral  TID   Continuous Infusions:  PRN Meds:.morphine injection, nitroGLYCERIN  ASSESSMENT AND PLAN:  1. NSTEMI, elevated serum troponin by serial marker, chest pain symptoms very suggestive of unstable angina pectoris. 2.  CAD and history of CABG in 2007, 3 and history of  PTCA post CABG, a total of 6 stents appears to have stent 4 to SVG to RCA, 2 stents in native vessels and latest stent was on 01/10/2015, [place], [place]  3. Diabetes mellitus type 2 controlled 4. Hypertension 5. Hyperlipidemia 6. 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 7. 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  Recommendation: Patient's symptoms are very classic for recurrence of unstable angina. He will need repeat coronary angiography. I will  transfer the patient to [facility] under stepdown unit, patient has on and off chest discomfort. We will continue to trend his cardiac markers. I'll obtain lipid profile testing in the morning. Follow-up on echocardiogram. Discussed risks, benefits and alternatives of angiogram including but not limited to <1% risk of death, stroke, MI, need for urgent surgical revascularization, renal failure, but not limited to thest. patient is willing to proceed. Start full dose Lovenox, check P2Y12 receptor response as patient is on chronic Plavix.  Adrian Prows, MD 02/03/2015, 7:11 PM [facility] [place] Pager: [phone] Office: [phone] If no answer Cell [phone]

## 2015-02-04 NOTE — Progress Notes (Signed)
Removed TR band at this time. No active bleeding. Dressed with a gauze and tegaderm per protocol.

## 2015-02-05 ENCOUNTER — Encounter (HOSPITAL_COMMUNITY): Payer: Self-pay | Admitting: Cardiology

## 2015-02-05 LAB — GLUCOSE, CAPILLARY
Glucose-Capillary: 137 mg/dL — ABNORMAL HIGH (ref 65–99)
Glucose-Capillary: 140 mg/dL — ABNORMAL HIGH (ref 65–99)
Glucose-Capillary: 121 mg/dL — ABNORMAL HIGH (ref 65–99)
Glucose-Capillary: 182 mg/dL — ABNORMAL HIGH (ref 65–99)

## 2015-02-05 LAB — CBC
HCT: 34.1 % — ABNORMAL LOW (ref 39.0–52.0)
Hemoglobin: 12.1 g/dL — ABNORMAL LOW (ref 13.0–17.0)
MCH: 33.1 pg (ref 26.0–34.0)
MCHC: 35.5 g/dL (ref 30.0–36.0)
MCV: 93.2 fL (ref 78.0–100.0)
Platelets: 114 10*3/uL — ABNORMAL LOW (ref 150–400)
RBC: 3.66 MIL/uL — ABNORMAL LOW (ref 4.22–5.81)
RDW: 12.3 % (ref 11.5–15.5)
WBC: 4.5 10*3/uL (ref 4.0–10.5)

## 2015-02-05 LAB — LIPID PANEL
CHOLESTEROL: 108 mg/dL (ref 0–200)
HDL: 21 mg/dL — ABNORMAL LOW (ref >40)
LDL Cholesterol: 42 mg/dL (ref 0–99)
TRIGLYCERIDES: 226 mg/dL — AB (ref <150)
Total CHOL/HDL Ratio: 5.1 RATIO
VLDL: 45 mg/dL — ABNORMAL HIGH (ref 0–40)

## 2015-02-05 LAB — BASIC METABOLIC PANEL
ANION GAP: 7 (ref 5–15)
BUN: 15 mg/dL (ref 6–20)
CALCIUM: 8.3 mg/dL — AB (ref 8.9–10.3)
CO2: 21 mmol/L — ABNORMAL LOW (ref 22–32)
CREATININE: 0.95 mg/dL (ref 0.61–1.24)
Chloride: 111 mmol/L (ref 101–111)
GFR calc Af Amer: >60 mL/min (ref >60)
GFR calc non Af Amer: >60 mL/min (ref >60)
GLUCOSE: 130 mg/dL — AB (ref 65–99)
Potassium: 3.7 mmol/L (ref 3.5–5.1)
Sodium: 139 mmol/L (ref 135–145)

## 2015-02-05 LAB — TROPONIN I: Troponin I: 4.79 ng/mL (ref <0.031)

## 2015-02-05 MED ORDER — INSULIN ASPART 100 UNIT/ML ~~LOC~~ SOLN
0.0000 [IU] | Freq: Three times a day (TID) | SUBCUTANEOUS | Status: DC
  Administered 2015-02-06: 1 [IU] via SUBCUTANEOUS

## 2015-02-05 MED ORDER — INSULIN ASPART 100 UNIT/ML ~~LOC~~ SOLN: 0.0000 [IU] | Freq: Every day | SUBCUTANEOUS | Status: DC

## 2015-02-05 MED FILL — Verapamil HCl IV Soln 2.5 MG/ML: INTRAVENOUS | Qty: 2 | Status: AC

## 2015-02-05 MED FILL — Heparin Sodium (Porcine) 2 Unit/ML in Sodium Chloride 0.9%: INTRAMUSCULAR | Qty: 1000 | Status: CN

## 2015-02-05 MED FILL — Heparin Sodium (Porcine) Inj 1000 Unit/ML: INTRAMUSCULAR | Qty: 10 | Status: AC

## 2015-02-05 NOTE — Progress Notes (Signed)
Subjective:  No further chest pain. Feels well. Had dyspnea with Brilinta.  Objective:  Vital Signs in the last 24 hours: Temp:  [97.5 F (36.4 C)-98.6 F (37 C)] 97.6 F (36.4 C) (07/13 0758) Pulse Rate:  [0-182] 67 (07/13 1021) Resp:  [0-25] 18 (07/13 0758) BP: (77-124)/(42-75) 88/42 mmHg (07/13 1021) SpO2:  [0 %-100 %] 99 % (07/13 0758)  Intake/Output from previous day: 07/12 0701 - 07/13 0700 In: 1723 [I.V.:1723] Out: 1250 [Urine:1250]  Physical Exam:  General appearance: alert, cooperative, appears stated age and no distress Lungs: clear to auscultation bilaterally Chest wall: no tenderness  Cardiac exam: Distant heart sounds. There is a 2/6 long systolic murmur at the apex conducted to the axilla. Abdominal exam: There is no organomegaly, soft, bowel sounds heard in all 4 quadrants, no tenderness. Neurologic: Grossly normal. Alert and oriented 3. Extremity: Full range of movements, no edema. Vascular exam: Soft left carotid bruit. To 3+ femoral pulses without bruit, popliteal pulse 2+ bilateral, pedal +2+ bilateral.  Left radial site has healed well.  Lab Results: BMP  Recent Labs  02/03/15 1037 02/04/15 0824 02/05/15 0228  NA 137 137 139  K 4.4 4.4 3.7  CL 101 106 111  CO2 24 23 21*  GLUCOSE 214* 169* 130*  BUN 21* 21* 15  CREATININE 1.14 1.02 0.95  CALCIUM 9.3 9.0 8.3*  GFRNONAA >60 >60 >60  GFRAA >60 >60 >60    CBC  Recent Labs Lab 02/04/15 0824 02/05/15 0228  WBC 6.2 4.5  RBC 4.19* 3.66*  HGB 13.6 12.1*  HCT 39.2 34.1*  PLT 112* 114*  MCV 93.6 93.2  MCH 32.5 33.1  MCHC 34.7 35.5  RDW 12.3 12.3  LYMPHSABS 1.2  --   MONOABS 1.0  --   EOSABS 0.0  --   BASOSABS 0.0  --     HEMOGLOBIN A1C No results found for: HGBA1C, MPG  Cardiac Panel (last 3 results)  Recent Labs  02/04/15 0824 02/04/15 1231 02/04/15 1919  TROPONINI 4.53* 5.03* 8.93*    BNP (last 3 results) No results for input(s): PROBNP in the last 8760  hours.  TSH No results for input(s): TSH in the last 8760 hours.   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 No ischemia. No significant change from 02/04/2015. RBBB previously noted is now more suggestive of posterior infarct. Echocardiogram 02/04/2015: Mild decrease in LV systolic function, ejection fraction 45%. Inferior and posterior severe hypokinesis to akinesis. Mild to moderate tricuspid regurgitation, mild pulmonary hypertension.  Scheduled Meds: . aspirin  81 mg Oral Daily  . atorvastatin  80 mg Oral q1800  . enalapril  2.5 mg Oral QHS  . enoxaparin (LOVENOX) injection  40 mg Subcutaneous Q24H  . insulin aspart  0-15 Units Subcutaneous TID WC  . metoprolol tartrate  12.5 mg Oral TID  . sodium chloride  3 mL Intravenous Q12H  . ticagrelor  90 mg Oral BID   Continuous Infusions:  PRN Meds:.sodium chloride, acetaminophen, ALPRAZolam, morphine injection, nitroGLYCERIN, ondansetron (ZOFRAN) IV, sodium chloride   Assessment/Plan:  1. NSTEMI, Inferior. 2. CAD S/P CABG 2007: 3 and history of PTCA post CABG, a total of 6 stents appears to have stent 4 to SVG to RCA, 2 stents in native vessels and  latest stent was on 01/10/2015, [place], [place] Now occluded  By angiogram 02/04/2015. Successful thrombectomy and balloon PTCA. SVG to OM occluded appears acute, but brisk flow through native circumflex. Ost Cx to Mid Cx lesion, 90% stenosed. Patient may need intervention to a very large circumflex coronary artery which has a high-grade proximal stenosis. 3. Hypertension, BP borderline low.  4. Hyperlipidemia 5. 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 6. Diabetes mellitus type 2 controlled  Rec: Continue present medical therapy, cardiac  rehabilitation both inpatient and outpatient, transferred to telemetry and discharge in the morning if he remains stable. By history, patient appears to have had ACS 5-6 days after stent implantation in which he had developed recurrence of chest pain after a dog fight, this was in [place]. Due to low blood pressure, we will hold off on the ACE inhibitors for now. Restart metformin tomorrow upon discharge.  Adrian Prows, M.D. 02/05/2015, 11:02 AM [facility] Pager: [phone] Office: [phone] If no answer: [phone]

## 2015-02-05 NOTE — Progress Notes (Signed)
[place] TEAM 1 - Stepdown/ICU TEAM Progress Note  Jesse [name] WER:[id number] DOB: [birth year] DOA: 02/03/2015 PCP: No primary care provider on file.  Admit HPI / Brief Narrative: 74 y.o. male with h/o CAD s/p CABG x3 2007 and PCI w/ 6 total stents since CABG the last of which was to the RCA ON 01/10/15, DM, SSS, and parox afib who presented with L sided chest pain radiating to the left arm pit, associated with nausea and palpitations. His initial troponin negative.   HPI/Subjective: Pt is resting comfortably.  He states his chest pain has resolved.  He denies n/v, abdom pain, sob, or HA.    Assessment/Plan:  Inferior NSTEMI  Successful thrombectomy and balloon PTCA 02/04/15 - other areas of CAD noted - ongoing care per Cardiology - cont medical tx as prescribed by Cards  HTN Not an active problem at this time, w/ BP marginal - follow trend w/o adjusting medications at this time   DM A1c pending - follow CBGs - on oral tx alone at home   HLD LDL 42 - cont usual tx   SSS S/p Pacer  Care per Cardiology   Code Status: FULL Family Communication: spoke w/ pt and wife at bedside Disposition Plan: expected d/c home in AM, therefore will not transfer pt (many empty beds in [place] at present)  Consultants: Cardiology - [place]  Procedures: 7/12 - Cardiac cath w/ PTCI  Antibiotics: none  DVT prophylaxis: SCDs  Objective: Blood pressure 95/64, pulse 64, temperature 98 F (36.7 C), temperature source Oral, resp. rate 18, height 6\' 2"  (1.88 m), weight 90.719 kg (200 lb), SpO2 99 %.  Intake/Output Summary (Last 24 hours) at 02/05/15 1514 Last data filed at 02/05/15 1300  Gross per 24 hour  Intake 1568.1 ml  Output   1250 ml  Net  318.1 ml   Exam: General: No acute respiratory distress Lungs: Clear to auscultation bilaterally without wheezes or crackles Cardiovascular: Regular rate and rhythm without murmur gallop or rub normal S1 and S2 Abdomen: Nontender, nondistended, soft,  bowel sounds positive, no rebound, no ascites, no appreciable mass Extremities: No significant cyanosis, clubbing, or edema bilateral lower extremities  Data Reviewed: Basic Metabolic Panel:  Recent Labs Lab 02/03/15 1037 02/04/15 0824 02/05/15 0228  NA 137 137 139  K 4.4 4.4 3.7  CL 101 106 111  CO2 24 23 21*  GLUCOSE 214* 169* 130*  BUN 21* 21* 15  CREATININE 1.14 1.02 0.95  CALCIUM 9.3 9.0 8.3*  MG  --  2.3  --     CBC:  Recent Labs Lab 02/03/15 1037 02/04/15 0824 02/05/15 0228  WBC 5.3 6.2 4.5  NEUTROABS  --  4.1  --   HGB 15.3 13.6 12.1*  HCT 43.4 39.2 34.1*  MCV 93.1 93.6 93.2  PLT 123* 112* 114*    Liver Function Tests:  Recent Labs Lab 02/04/15 0824  AST 50*  ALT 28  ALKPHOS 54  BILITOT 0.6  PROT 6.1*  ALBUMIN 3.5   Coags:  Recent Labs Lab 02/04/15 0230  INR 1.26   Cardiac Enzymes:  Recent Labs Lab 02/03/15 1615 02/04/15 0824 02/04/15 1231 02/04/15 1919 02/05/15 1119  TROPONINI 0.81* 4.53* 5.03* 8.93* 4.79*    CBG:  Recent Labs Lab 02/04/15 1147 02/04/15 1754 02/04/15 2112 02/05/15 0806 02/05/15 1322  GLUCAP 131* 115* 205* 137* 140*    Recent Results (from the past 240 hour(s))  MRSA PCR Screening     Status: None   Collection Time:  02/03/15  9:13 PM  Result Value Ref Range Status   MRSA by PCR NEGATIVE NEGATIVE Final    Comment:        The GeneXpert MRSA Assay (FDA approved for NASAL specimens only), is one component of a comprehensive MRSA colonization surveillance program. It is not intended to diagnose MRSA infection nor to guide or monitor treatment for MRSA infections.      Studies:   Recent x-ray studies have been reviewed in detail by the Attending Physician  Scheduled Meds:  Scheduled Meds: . aspirin  81 mg Oral Daily  . atorvastatin  80 mg Oral q1800  . enalapril  2.5 mg Oral QHS  . enoxaparin (LOVENOX) injection  40 mg Subcutaneous Q24H  . insulin aspart  0-15 Units Subcutaneous TID WC  .  metoprolol tartrate  12.5 mg Oral TID  . sodium chloride  3 mL Intravenous Q12H  . ticagrelor  90 mg Oral BID    Time spent on care of this patient: 25 mins   Tulip Meharg T , MD   [facility] Office  [phone] Pager - Text Page per Shea Evans as per below:  On-Call/Text Page:      [email or web address]      password [email or web address]  If 7PM-7AM, please contact night-coverage [email or web address] Password [email or web address] 02/05/2015, 3:14 PM   LOS: 2 days

## 2015-02-05 NOTE — Progress Notes (Signed)
CARDIAC REHAB PHASE I   PRE:  Rate/Rhythm: 65 SR    BP: sitting 102/60    SaO2:   MODE:  Ambulation: 350 ft   POST:  Rate/Rhythm: 103 pacing    BP: sitting 147/78     SaO2:   Tolerated well, no c/o. Feels good. Ed completed with pt and wife. Voiced understanding and gave Brilinta book. Interested in [place] but will need to pursue in [place] as he plans to return there to sell his house. Gave [facility] brochure in case he can do our program in future.  [phone]   Josephina Shih [place] CES, ACSM 02/05/2015 2:17 PM

## 2015-02-05 NOTE — Care Management Important Message (Signed)
Important Message  Patient Details  Name: Jesse [name] MRN: [id number] Date of Birth: 1940/08/07   Medicare Important Message Given:  Yes-second notification given    Nathen May 02/05/2015, 2:17 [place] Message  Patient Details  Name: Jesse [name] MRN: [id number] Date of Birth: 30-May-1941   Medicare Important Message Given:  Yes-second notification given    Nathen May 02/05/2015, 2:16 PM

## 2015-02-06 ENCOUNTER — Encounter (HOSPITAL_COMMUNITY): Payer: Self-pay | Admitting: Neurology

## 2015-02-06 DIAGNOSIS — E781 Pure hyperglyceridemia: Secondary | ICD-10-CM

## 2015-02-06 DIAGNOSIS — Z9861 Coronary angioplasty status: Secondary | ICD-10-CM

## 2015-02-06 DIAGNOSIS — E782 Mixed hyperlipidemia: Secondary | ICD-10-CM | POA: Insufficient documentation

## 2015-02-06 DIAGNOSIS — I10 Essential (primary) hypertension: Secondary | ICD-10-CM | POA: Insufficient documentation

## 2015-02-06 DIAGNOSIS — E785 Hyperlipidemia, unspecified: Secondary | ICD-10-CM

## 2015-02-06 DIAGNOSIS — E118 Type 2 diabetes mellitus with unspecified complications: Secondary | ICD-10-CM

## 2015-02-06 LAB — BASIC METABOLIC PANEL
Anion gap: 7 (ref 5–15)
BUN: 17 mg/dL (ref 6–20)
CALCIUM: 8.3 mg/dL — AB (ref 8.9–10.3)
CO2: 25 mmol/L (ref 22–32)
CREATININE: 1.04 mg/dL (ref 0.61–1.24)
Chloride: 104 mmol/L (ref 101–111)
GFR calc non Af Amer: >60 mL/min (ref >60)
GLUCOSE: 169 mg/dL — AB (ref 65–99)
Potassium: 4.1 mmol/L (ref 3.5–5.1)
SODIUM: 136 mmol/L (ref 135–145)

## 2015-02-06 LAB — GLUCOSE, CAPILLARY: GLUCOSE-CAPILLARY: 140 mg/dL — AB (ref 65–99)

## 2015-02-06 LAB — CBC
HCT: 37.1 % — ABNORMAL LOW (ref 39.0–52.0)
Hemoglobin: 13.2 g/dL (ref 13.0–17.0)
MCH: 33 pg (ref 26.0–34.0)
MCHC: 35.6 g/dL (ref 30.0–36.0)
MCV: 92.8 fL (ref 78.0–100.0)
Platelets: 131 10*3/uL — ABNORMAL LOW (ref 150–400)
RBC: 4 MIL/uL — ABNORMAL LOW (ref 4.22–5.81)
RDW: 12.2 % (ref 11.5–15.5)
WBC: 4.3 10*3/uL (ref 4.0–10.5)

## 2015-02-06 MED ORDER — ATORVASTATIN CALCIUM 80 MG PO TABS: 80.0000 mg | ORAL_TABLET | Freq: Every day | ORAL | Status: DC

## 2015-02-06 MED ORDER — TICAGRELOR 90 MG PO TABS
90.0000 mg | ORAL_TABLET | Freq: Two times a day (BID) | ORAL | Status: DC
Start: 2015-02-06 — End: 2017-08-05

## 2015-02-06 MED ORDER — ASPIRIN 81 MG PO CHEW: 81.0000 mg | CHEWABLE_TABLET | Freq: Every day | ORAL | Status: DC

## 2015-02-06 MED ORDER — METOPROLOL TARTRATE 25 MG PO TABS: 12.5000 mg | ORAL_TABLET | Freq: Three times a day (TID) | ORAL | Status: DC

## 2015-02-06 NOTE — Progress Notes (Signed)
Gave pt 30day free brilinta card. Pt states has medicare d plan to cover meds. Hopes for dc today.

## 2015-02-06 NOTE — Discharge Summary (Signed)
Physician Discharge Summary  Nussen Pullin FIE:[id number] DOB: 1940-11-04 DOA: 02/03/2015  PCP: No primary care provider on file.  Admit date: 02/03/2015 Discharge date: 02/06/2015  Time spent: 40 minutes  Recommendations for Outpatient Follow-up:  Inferior NSTEMI  -Successful thrombectomy and balloon PTCA 02/04/15 - other areas of CAD noted -Per Dr.Jay [facility] (cardiology), they will set up outpatient cardiac rehabilitation. Patient to follow-up in 10 days with Dr. Einar Gip.   Essential HTN -Continue enalapril 2.5 mg daily -Continue metoprolol 12.5 mg TID -Continue Brilinta 90 mg BID    DM type 2 with complication -Restart glyburide 2 mg  BID -Restart metformin 500 mg QAm  -Follow-up with PCP and obtain hemoglobin A1c -Patient to establish care with [facility], Within 7 days.     HLD/Hypertriglyceridemia -LDL 42  -cont Lipitor 80 mg daily    SSS S/p Pacer  -Care per Cardiology     Discharge Diagnoses:  Principal Problem:   NSTEMI (non-ST elevated myocardial infarction) Active Problems:   Chest pain   Hx of sick sinus syndrome   Post PTCA   Diabetes mellitus   Discharge Condition: Stable  Diet recommendation: Heart healthy/diabetic  Filed Weights   02/03/15 1130  Weight: 90.719 kg (200 lb)    History of present illness:  74 y.o. male with h/o CAD s/p CABG x3 2007 and PCI w/ 6 total stents since CABG the last of which was to the RCA ON 01/10/15, DM, SSS, and parox afib who presented with L sided chest pain radiating to the left arm pit, associated with nausea and palpitations. His initial troponin negative.     Procedures: 7/12 - Cardiac cath w/ PTCI; Balloon PTCA. SVG to OM occluded - Ost Cx to Mid Cx lesion, 90% stenosed. Patient may need intervention to a very large circumflex coronary artery which has a high-grade proximal stenosis.   Consultations: Dr.Jay [facility] (cardiology)   Antibiotics  NA   Discharge  Exam: Filed Vitals:   02/06/15 0000 02/06/15 0311 02/06/15 0751 02/06/15 0920  BP: 95/47 129/73 107/66 100/56  Pulse: 60 77 60 62  Temp: 97.4 F (36.3 C) 98 F (36.7 C) 97.3 F (36.3 C)   TempSrc: Oral Oral Oral   Resp: 16 18 18    Height:      Weight:      SpO2: 99% 99% 98%     General: A/O 4, NAD, Cardiovascular: Regular rhythm and rate, negative murmurs rubs or gallops, normal S1/S2 (distant heart sounds) Respiratory: Clear to auscultation bilateral  Discharge Instructions     Medication List    ASK your doctor about these medications        aspirin 325 MG EC tablet  Take 325 mg by mouth daily.     clopidogrel 75 MG tablet  Commonly known as:  PLAVIX  Take 75 mg by mouth at bedtime.     enalapril 2.5 MG tablet  Commonly known as:  VASOTEC  Take 2.5 mg by mouth at bedtime.     glimepiride 2 MG tablet  Commonly known as:  AMARYL  Take 2 mg by mouth 2 (two) times daily.     metFORMIN 500 MG tablet  Commonly known as:  GLUCOPHAGE  Take 500 mg by mouth daily with breakfast.     metoprolol tartrate 25 MG tablet  Commonly known as:  LOPRESSOR  Take 25 mg by mouth 3 (three) times daily.     vitamin B-12 1000 MCG tablet  Commonly known as:  CYANOCOBALAMIN  Take 1,000 mcg by mouth daily.       No Known Allergies    The results of significant diagnostics from this hospitalization (including imaging, microbiology, ancillary and laboratory) are listed below for reference.    Significant Diagnostic Studies: Dg Chest 2 View  02/03/2015   CLINICAL DATA:  Left-sided chest pain extending to the arm, shortness of breath, dizziness  EXAM: CHEST  2 VIEW  COMPARISON:  None.  FINDINGS: There is no focal parenchymal opacity. There is no pleural effusion or pneumothorax. The heart and mediastinal contours are unremarkable. There is evidence of prior CABG. There is a dual lead cardiac pacer.  The osseous structures are unremarkable.  IMPRESSION: No active cardiopulmonary  disease.   Electronically Signed   By: Kathreen Devoid   On: 02/03/2015 11:23    Microbiology: Recent Results (from the past 240 hour(s))  MRSA PCR Screening     Status: None   Collection Time: 02/03/15  9:13 PM  Result Value Ref Range Status   MRSA by PCR NEGATIVE NEGATIVE Final    Comment:        The GeneXpert MRSA Assay (FDA approved for NASAL specimens only), is one component of a comprehensive MRSA colonization surveillance program. It is not intended to diagnose MRSA infection nor to guide or monitor treatment for MRSA infections.      Labs: Basic Metabolic Panel:  Recent Labs Lab 02/03/15 1037 02/04/15 0824 02/05/15 0228 02/06/15 0250  NA 137 137 139 136  K 4.4 4.4 3.7 4.1  CL 101 106 111 104  CO2 24 23 21* 25  GLUCOSE 214* 169* 130* 169*  BUN 21* 21* 15 17  CREATININE 1.14 1.02 0.95 1.04  CALCIUM 9.3 9.0 8.3* 8.3*  MG  --  2.3  --   --    Liver Function Tests:  Recent Labs Lab 02/04/15 0824  AST 50*  ALT 28  ALKPHOS 54  BILITOT 0.6  PROT 6.1*  ALBUMIN 3.5   No results for input(s): LIPASE, AMYLASE in the last 168 hours. No results for input(s): AMMONIA in the last 168 hours. CBC:  Recent Labs Lab 02/03/15 1037 02/04/15 0824 02/05/15 0228 02/06/15 0739  WBC 5.3 6.2 4.5 PENDING  NEUTROABS  --  4.1  --   --   HGB 15.3 13.6 12.1* 13.2  HCT 43.4 39.2 34.1* 37.1*  MCV 93.1 93.6 93.2 92.8  PLT 123* 112* 114* 131*   Cardiac Enzymes:  Recent Labs Lab 02/03/15 1615 02/04/15 0824 02/04/15 1231 02/04/15 1919 02/05/15 1119  TROPONINI 0.81* 4.53* 5.03* 8.93* 4.79*   BNP: BNP (last 3 results) No results for input(s): BNP in the last 8760 hours.  ProBNP (last 3 results) No results for input(s): PROBNP in the last 8760 hours.  CBG:  Recent Labs Lab 02/05/15 0806 02/05/15 1322 02/05/15 1634 02/05/15 2119 02/06/15 0750  GLUCAP 137* 140* 121* 182* 140*       Signed:  Dia Crawford, MD [facility] [phone]  pager

## 2015-02-06 NOTE — Progress Notes (Signed)
Subjective:  No further chest pain. Feels well.  Patient ambulated in the hallway with cardiac rehabilitation without any limitations. Doing well this morning. Objective:  Vital Signs in the last 24 hours: Temp:  [97.3 F (36.3 C)-98 F (36.7 C)] 97.3 F (36.3 C) (07/14 0751) Pulse Rate:  [60-83] 62 (07/14 0920) Resp:  [16-18] 18 (07/14 0751) BP: (95-131)/(47-73) 100/56 mmHg (07/14 0920) SpO2:  [98 %-99 %] 98 % (07/14 0751)  Intake/Output from previous day: 07/13 0701 - 07/14 0700 In: 720 [P.O.:720] Out: 300 [Urine:300]  Physical Exam:  General appearance: alert, cooperative, appears stated age and no distress Lungs: clear to auscultation bilaterally Chest wall: no tenderness  Cardiac exam: Distant heart sounds. There is a 2/6 long systolic murmur at the apex conducted to the axilla. Abdominal exam: There is no organomegaly, soft, bowel sounds heard in all 4 quadrants, no tenderness. Neurologic: Grossly normal. Alert and oriented 3. Extremity: Full range of movements, no edema. Vascular exam: Soft left carotid bruit. To 3+ femoral pulses without bruit, popliteal pulse 2+ bilateral, pedal +2+ bilateral.  Left radial site has healed well.  Lab Results: BMP  Recent Labs  02/04/15 0824 02/05/15 0228 02/06/15 0250  NA 137 139 136  K 4.4 3.7 4.1  CL 106 111 104  CO2 23 21* 25  GLUCOSE 169* 130* 169*  BUN 21* 15 17  CREATININE 1.02 0.95 1.04  CALCIUM 9.0 8.3* 8.3*  GFRNONAA >60 >60 >60  GFRAA >60 >60 >60    CBC  Recent Labs Lab 02/04/15 0824  02/06/15 0739  WBC 6.2  < > 4.3  RBC 4.19*  < > 4.00*  HGB 13.6  < > 13.2  HCT 39.2  < > 37.1*  PLT 112*  < > 131*  MCV 93.6  < > 92.8  MCH 32.5  < > 33.0  MCHC 34.7  < > 35.6  RDW 12.3  < > 12.2  LYMPHSABS 1.2  --   --   MONOABS 1.0  --   --   EOSABS 0.0  --   --   BASOSABS 0.0  --   --   < > = values in this interval not displayed.  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 No ischemia. No significant change from 02/04/2015. RBBB previously noted is now more suggestive of posterior infarct. Echocardiogram 02/04/2015: Mild decrease in LV systolic function, ejection fraction 45%. Inferior and posterior severe hypokinesis to akinesis. Mild to moderate tricuspid regurgitation, mild pulmonary hypertension.  Scheduled Meds: . aspirin  81 mg Oral Daily  . atorvastatin  80 mg Oral q1800  . enalapril  2.5 mg Oral QHS  . enoxaparin (LOVENOX) injection  40 mg Subcutaneous Q24H  . insulin aspart  0-5 Units Subcutaneous QHS  . insulin aspart  0-9 Units Subcutaneous TID WC  . metoprolol tartrate  12.5 mg Oral TID  . ticagrelor  90 mg Oral BID   Continuous Infusions:  PRN Meds:.acetaminophen, ALPRAZolam, morphine injection, nitroGLYCERIN, ondansetron (ZOFRAN) IV   Assessment/Plan:  1. NSTEMI, Inferior. 2. CAD S/P CABG 2007: 3 and history of PTCA post CABG, a total of 6 stents appears to have stent 4 to SVG to RCA, 2 stents in native vessels and latest stent was on 01/10/2015, [place], [place] Now occluded  By angiogram 02/04/2015. Successful thrombectomy and balloon PTCA. SVG to OM occluded appears acute, but brisk flow through native circumflex. Ost Cx to Mid Cx lesion, 90% stenosed. Patient may need intervention to a very large circumflex coronary artery which has a high-grade proximal stenosis. 3. Hypertension, BP borderline low, but stable 4. Hyperlipidemia, mixed 5.  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 6. Diabetes mellitus type 2 controlled  Rec: Continue present medical therapy, cardiac rehabilitation  will be set up in the outpatient basis,  can be discharged home from cardiac standpoint. By history, patient appears to have had ACS 5-6 days after stent implantation in which he had developed recurrence of chest pain after a dog fight, this was in [place]. Due to low blood pressure, we will hold off on the ACE inhibitors for now. Restart metformin tomorrow upon discharge. Please note change in the statin medications and also antiplatelets regimen.  Adrian Prows, M.D. 02/06/2015, 11:23 AM [facility] Pager: [phone] Office: [phone] If no answer: [phone]

## 2015-09-22 DIAGNOSIS — Z125 Encounter for screening for malignant neoplasm of prostate: Secondary | ICD-10-CM | POA: Diagnosis not present

## 2015-09-22 DIAGNOSIS — I10 Essential (primary) hypertension: Secondary | ICD-10-CM | POA: Diagnosis not present

## 2015-09-22 DIAGNOSIS — I2510 Atherosclerotic heart disease of native coronary artery without angina pectoris: Secondary | ICD-10-CM | POA: Diagnosis not present

## 2015-09-22 DIAGNOSIS — E119 Type 2 diabetes mellitus without complications: Secondary | ICD-10-CM | POA: Diagnosis not present

## 2015-10-16 DIAGNOSIS — Z950 Presence of cardiac pacemaker: Secondary | ICD-10-CM | POA: Diagnosis not present

## 2015-10-16 DIAGNOSIS — I495 Sick sinus syndrome: Secondary | ICD-10-CM | POA: Diagnosis not present

## 2015-11-20 DIAGNOSIS — E782 Mixed hyperlipidemia: Secondary | ICD-10-CM | POA: Diagnosis not present

## 2015-11-20 DIAGNOSIS — E1169 Type 2 diabetes mellitus with other specified complication: Secondary | ICD-10-CM | POA: Diagnosis not present

## 2015-11-20 DIAGNOSIS — E089 Diabetes mellitus due to underlying condition without complications: Secondary | ICD-10-CM | POA: Diagnosis not present

## 2015-11-20 DIAGNOSIS — I25119 Atherosclerotic heart disease of native coronary artery with unspecified angina pectoris: Secondary | ICD-10-CM | POA: Diagnosis not present

## 2016-01-09 DIAGNOSIS — Z23 Encounter for immunization: Secondary | ICD-10-CM | POA: Diagnosis not present

## 2016-04-09 DIAGNOSIS — Z23 Encounter for immunization: Secondary | ICD-10-CM | POA: Diagnosis not present

## 2016-04-09 DIAGNOSIS — I2510 Atherosclerotic heart disease of native coronary artery without angina pectoris: Secondary | ICD-10-CM | POA: Diagnosis not present

## 2016-04-09 DIAGNOSIS — I10 Essential (primary) hypertension: Secondary | ICD-10-CM | POA: Diagnosis not present

## 2016-04-09 DIAGNOSIS — E119 Type 2 diabetes mellitus without complications: Secondary | ICD-10-CM | POA: Diagnosis not present

## 2016-04-09 DIAGNOSIS — N400 Enlarged prostate without lower urinary tract symptoms: Secondary | ICD-10-CM | POA: Diagnosis not present

## 2016-04-16 DIAGNOSIS — I495 Sick sinus syndrome: Secondary | ICD-10-CM | POA: Diagnosis not present

## 2016-04-16 DIAGNOSIS — Z950 Presence of cardiac pacemaker: Secondary | ICD-10-CM | POA: Diagnosis not present

## 2016-07-14 DIAGNOSIS — I495 Sick sinus syndrome: Secondary | ICD-10-CM | POA: Diagnosis not present

## 2016-07-14 DIAGNOSIS — Z950 Presence of cardiac pacemaker: Secondary | ICD-10-CM | POA: Diagnosis not present

## 2016-07-30 DIAGNOSIS — E1169 Type 2 diabetes mellitus with other specified complication: Secondary | ICD-10-CM | POA: Diagnosis not present

## 2016-07-30 DIAGNOSIS — E782 Mixed hyperlipidemia: Secondary | ICD-10-CM | POA: Diagnosis not present

## 2016-07-30 DIAGNOSIS — E089 Diabetes mellitus due to underlying condition without complications: Secondary | ICD-10-CM | POA: Diagnosis not present

## 2016-07-30 DIAGNOSIS — I25119 Atherosclerotic heart disease of native coronary artery with unspecified angina pectoris: Secondary | ICD-10-CM | POA: Diagnosis not present

## 2016-10-01 DIAGNOSIS — I10 Essential (primary) hypertension: Secondary | ICD-10-CM | POA: Diagnosis not present

## 2016-10-01 DIAGNOSIS — Z955 Presence of coronary angioplasty implant and graft: Secondary | ICD-10-CM | POA: Diagnosis not present

## 2016-10-01 DIAGNOSIS — Z951 Presence of aortocoronary bypass graft: Secondary | ICD-10-CM | POA: Diagnosis not present

## 2016-10-01 DIAGNOSIS — E0800 Diabetes mellitus due to underlying condition with hyperosmolarity without nonketotic hyperglycemic-hyperosmolar coma (NKHHC): Secondary | ICD-10-CM | POA: Diagnosis not present

## 2016-10-08 DIAGNOSIS — I495 Sick sinus syndrome: Secondary | ICD-10-CM | POA: Diagnosis not present

## 2016-10-08 DIAGNOSIS — Z950 Presence of cardiac pacemaker: Secondary | ICD-10-CM | POA: Diagnosis not present

## 2016-11-17 DIAGNOSIS — Z125 Encounter for screening for malignant neoplasm of prostate: Secondary | ICD-10-CM | POA: Diagnosis not present

## 2016-11-17 DIAGNOSIS — I252 Old myocardial infarction: Secondary | ICD-10-CM | POA: Diagnosis not present

## 2016-11-17 DIAGNOSIS — E782 Mixed hyperlipidemia: Secondary | ICD-10-CM | POA: Diagnosis not present

## 2016-11-17 DIAGNOSIS — E118 Type 2 diabetes mellitus with unspecified complications: Secondary | ICD-10-CM | POA: Diagnosis not present

## 2016-11-17 DIAGNOSIS — Z955 Presence of coronary angioplasty implant and graft: Secondary | ICD-10-CM | POA: Diagnosis not present

## 2016-11-30 DIAGNOSIS — H2513 Age-related nuclear cataract, bilateral: Secondary | ICD-10-CM | POA: Diagnosis not present

## 2016-11-30 DIAGNOSIS — H04123 Dry eye syndrome of bilateral lacrimal glands: Secondary | ICD-10-CM | POA: Diagnosis not present

## 2016-11-30 DIAGNOSIS — E119 Type 2 diabetes mellitus without complications: Secondary | ICD-10-CM | POA: Diagnosis not present

## 2016-11-30 DIAGNOSIS — H40013 Open angle with borderline findings, low risk, bilateral: Secondary | ICD-10-CM | POA: Diagnosis not present

## 2017-01-14 DIAGNOSIS — I495 Sick sinus syndrome: Secondary | ICD-10-CM | POA: Diagnosis not present

## 2017-01-14 DIAGNOSIS — Z950 Presence of cardiac pacemaker: Secondary | ICD-10-CM | POA: Diagnosis not present

## 2017-01-31 DIAGNOSIS — E1169 Type 2 diabetes mellitus with other specified complication: Secondary | ICD-10-CM | POA: Diagnosis not present

## 2017-01-31 DIAGNOSIS — I25119 Atherosclerotic heart disease of native coronary artery with unspecified angina pectoris: Secondary | ICD-10-CM | POA: Diagnosis not present

## 2017-02-11 DIAGNOSIS — E1169 Type 2 diabetes mellitus with other specified complication: Secondary | ICD-10-CM | POA: Diagnosis not present

## 2017-02-11 DIAGNOSIS — R0602 Shortness of breath: Secondary | ICD-10-CM | POA: Diagnosis not present

## 2017-02-11 DIAGNOSIS — I25119 Atherosclerotic heart disease of native coronary artery with unspecified angina pectoris: Secondary | ICD-10-CM | POA: Diagnosis not present

## 2017-02-11 DIAGNOSIS — E119 Type 2 diabetes mellitus without complications: Secondary | ICD-10-CM | POA: Diagnosis not present

## 2017-02-14 DIAGNOSIS — R0602 Shortness of breath: Secondary | ICD-10-CM | POA: Diagnosis not present

## 2017-02-14 DIAGNOSIS — E119 Type 2 diabetes mellitus without complications: Secondary | ICD-10-CM | POA: Diagnosis not present

## 2017-02-14 DIAGNOSIS — I10 Essential (primary) hypertension: Secondary | ICD-10-CM | POA: Diagnosis not present

## 2017-02-14 DIAGNOSIS — I2510 Atherosclerotic heart disease of native coronary artery without angina pectoris: Secondary | ICD-10-CM | POA: Diagnosis not present

## 2017-02-18 DIAGNOSIS — H04123 Dry eye syndrome of bilateral lacrimal glands: Secondary | ICD-10-CM | POA: Diagnosis not present

## 2017-02-18 DIAGNOSIS — H2513 Age-related nuclear cataract, bilateral: Secondary | ICD-10-CM | POA: Diagnosis not present

## 2017-02-18 DIAGNOSIS — H401132 Primary open-angle glaucoma, bilateral, moderate stage: Secondary | ICD-10-CM | POA: Diagnosis not present

## 2017-03-08 DIAGNOSIS — I25119 Atherosclerotic heart disease of native coronary artery with unspecified angina pectoris: Secondary | ICD-10-CM | POA: Diagnosis not present

## 2017-03-08 DIAGNOSIS — I10 Essential (primary) hypertension: Secondary | ICD-10-CM | POA: Diagnosis not present

## 2017-03-08 DIAGNOSIS — R0602 Shortness of breath: Secondary | ICD-10-CM | POA: Diagnosis not present

## 2017-03-21 DIAGNOSIS — B0229 Other postherpetic nervous system involvement: Secondary | ICD-10-CM | POA: Diagnosis not present

## 2017-03-21 DIAGNOSIS — Z955 Presence of coronary angioplasty implant and graft: Secondary | ICD-10-CM | POA: Diagnosis not present

## 2017-03-21 DIAGNOSIS — E118 Type 2 diabetes mellitus with unspecified complications: Secondary | ICD-10-CM | POA: Diagnosis not present

## 2017-03-25 DIAGNOSIS — Z950 Presence of cardiac pacemaker: Secondary | ICD-10-CM | POA: Diagnosis not present

## 2017-03-25 DIAGNOSIS — I25119 Atherosclerotic heart disease of native coronary artery with unspecified angina pectoris: Secondary | ICD-10-CM | POA: Diagnosis not present

## 2017-03-25 DIAGNOSIS — R0602 Shortness of breath: Secondary | ICD-10-CM | POA: Diagnosis not present

## 2017-04-15 DIAGNOSIS — H04123 Dry eye syndrome of bilateral lacrimal glands: Secondary | ICD-10-CM | POA: Diagnosis not present

## 2017-04-15 DIAGNOSIS — H2513 Age-related nuclear cataract, bilateral: Secondary | ICD-10-CM | POA: Diagnosis not present

## 2017-04-15 DIAGNOSIS — H401132 Primary open-angle glaucoma, bilateral, moderate stage: Secondary | ICD-10-CM | POA: Diagnosis not present

## 2017-05-02 DIAGNOSIS — Z23 Encounter for immunization: Secondary | ICD-10-CM | POA: Diagnosis not present

## 2017-05-27 DIAGNOSIS — H401132 Primary open-angle glaucoma, bilateral, moderate stage: Secondary | ICD-10-CM | POA: Diagnosis not present

## 2017-05-27 DIAGNOSIS — H2513 Age-related nuclear cataract, bilateral: Secondary | ICD-10-CM | POA: Diagnosis not present

## 2017-05-27 DIAGNOSIS — H04123 Dry eye syndrome of bilateral lacrimal glands: Secondary | ICD-10-CM | POA: Diagnosis not present

## 2017-07-27 DIAGNOSIS — R0602 Shortness of breath: Secondary | ICD-10-CM | POA: Diagnosis not present

## 2017-07-27 DIAGNOSIS — I495 Sick sinus syndrome: Secondary | ICD-10-CM | POA: Diagnosis not present

## 2017-07-27 DIAGNOSIS — I25119 Atherosclerotic heart disease of native coronary artery with unspecified angina pectoris: Secondary | ICD-10-CM | POA: Diagnosis not present

## 2017-07-27 DIAGNOSIS — Z950 Presence of cardiac pacemaker: Secondary | ICD-10-CM | POA: Diagnosis not present

## 2017-07-29 DIAGNOSIS — Z950 Presence of cardiac pacemaker: Secondary | ICD-10-CM | POA: Diagnosis not present

## 2017-07-29 DIAGNOSIS — I25119 Atherosclerotic heart disease of native coronary artery with unspecified angina pectoris: Secondary | ICD-10-CM | POA: Diagnosis not present

## 2017-07-29 DIAGNOSIS — I495 Sick sinus syndrome: Secondary | ICD-10-CM | POA: Diagnosis not present

## 2017-07-29 DIAGNOSIS — Z45018 Encounter for adjustment and management of other part of cardiac pacemaker: Secondary | ICD-10-CM | POA: Diagnosis not present

## 2017-08-04 NOTE — H&P (Signed)
OFFICE VISIT NOTES COPIED TO EPIC FOR DOCUMENTATION  . History of Present Illness Gwinda Maine FNP-C; 07/27/2017 3:48 PM) Patient words: Last OV 03/25/2017; Acute visit for sob and chest pain on exertion;.  The patient is a 77 year old male who presents for a Follow-up for Dyspnea.  Additional reasons for visit:  Follow-up for Coronary artery disease is described as the following: He has history of known coronary artery disease and CABG in 2007 in [place] with LIMA to LAD, SVG to OM which is occluded by angiography on 02/04/2015, thrombotic SVG to RCA with high-grade stenosis and recurrent restenosis in the veign graft for which she underwent balloon angioplasty after aspiration, felt that the vessel probably would occlude and has very small diffusely diseased RCA. Native large circumflex coronary artery has ostial what appears to be a high-grade stenosis. Due to non-culprit vessel disease, it was deferred for possible relook at a later date. But during f/u, he felt well and hence no further evaluation was performed. However, he now presents today for acute visit for exertional chest pain and dyspnea. He reports that 1 month ago he began having chest pain and dyspnea with walking that he previously tolerated well. He has been using nitroglycerin frequently that does resolve his chest pain. He has also noted elevated heart rate with exertion.  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 No PND or orthopnea. No leg edema.   Chest pain    Problem List/Past Medical Frances Furbish Wynetta Emery; 07/27/2017 3:13 PM) Controlled type 2 diabetes mellitus without complication, without long-term current use of insulin (E11.9)  History of coronary artery bypass graft x 3 (Z95.1)  CAD S/P CABG 2007: 3 and history of PTCA post CABG (h/o stenting to SVG to RCA x 4 and x2 to native vessel, D1 and RI 40% ISR). Coronary angiogram 02/04/2015. SVG to  OM occluded. Ost Cx to Mid Cx lesion, 90% stenosed. SVG to RCA occluded S/P Thrombectomy and balloon PTCA. Patient may need intervention to a very large circumflex coronary artery which has a high-grade proximal stenosis. Patent LIMA to LAD. 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 Inferior and posterior severe hypokinesis to akinesis. Mild to moderate tricuspid regurgitation, mild pulmonary hypertension. GERD without esophagitis (K21.9)  Shortness of breath on exertion (R06.02)  Echocardiogram 03/08/2017: 1. Left ventricle cavity is normal in size. Concentric hypertrophy of the left ventricle. Abnormal septal wall motion due to post-operative coronary artery bypass graft. Doppler evidence of grade I (impaired) diastolic dysfunction. Calculated EF 55%. 2. Trileaflet aortic valve with mild (Grade I) regurgitation. Moderate aortic valve leaflet calcification. 3. Structurally normal tricuspid valve with mild to moderate regurgitation. Pulmonary artery systolic pressure is estimated at 20-25 mm Hg. Labwork  02/01/2017: Cholesterol 127, triglycerides 127, HDL 33, LDL 69. TSH 1.89. CBC normal. Glucose 140, creatinine 1.19, potassium 4.4, CMP normal. 04/09/2016: Creatinine 1.1, Potassium 4.7, CMP normal. Cholesterol 136, HDL 33, LDL 63, Triglycerides 201. HgbA1c 6.6. H/H normal, macrocytic indicies, CBC normal. 09/21/2015: Potassium 4.9, Creatinine 1.00, CMP normal. CHolesterol 126, HDL 31, LDL 67, Triglycerides 140. TSH 2.130. CBC normal. 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 Coronary angiogram 02/04/2015. SVG to OM occluded. Ost Cx to Mid Cx lesion, 90% stenosed. SVG to RCA occluded S/P Thrombectomy and balloon PTCA. Severe diffuse disease native RCA and small. Patent LIMA to LAD. Cx amenable for PCI. Lexiscan myoview stress test 02/14/2017 1. The resting electrocardiogram demonstrated normal sinus rhythm and incomplete RBBB. Inferior and posterior infarct, old. Stress EKG is non-diagnostic for ischemia as it a pharmacologic stress using Lexiscan. Stress symptoms included dyspnea. Patient unable to complete stress test due to fatigue and switched to [place]. 2. Myocardial perfusion imaging is abnormal. There is a moderate area of infarction in the basal inferior, mid inferior and apical inferior myocardial wall(s). Gated SPECT imaging demonstrates akinesis of the basal inferior, mid inferior and apical inferior myocardial wall(s). The left ventricular ejection fraction was calculated or visually estimated to be 38%. This is an intermediate risk study, clinical correlation recommended. Cardiac pacemaker in situ (Z95.0) [2011]: sick sinus syndrome Medtronic pacemaker implantation in 2011 for bradycardia. [facility] in [place].  Allergies Frances Furbish Johnson; 2017/07/31 3:13 PM) Brilinta *HEMATOLOGICAL AGENTS - MISC.*  Difficulty breathing.  Family History Frances Furbish Johnson; 2017-07-31 3:13 PM) Mother  Deceased. at age 32, from [place]. Known MI in her 50's. Father  Deceased. at age 14, from Stroke. No prior Heart Issues Known Brother 1  Deceased  Social History Cheri Kearns; 07-31-2017 3:13 PM) Current tobacco use  Former smoker. Quit at age 81 Non Drinker/No Alcohol Use  Marital status  Widowed. Lives with Partner Living Situation  Lives with Partner Number of Children  0.  Past Surgical History Frances Furbish Wynetta Emery; 07/31/17 3:13 PM) Coronary Artery  Bypass, Three  CAD S/P CABG 2007: 3 and history of PTCA post CABG (h/o stenting to SVG to RCA x 4 and x2 to native vessel, D1 and RI 40% ISR). Coronary angiogram 02/04/2015. SVG to OM occluded. SVG to RCA occluded S/P Thrombectomy and balloon PTCA. Patent LIMA to LAD. Surgery to repair injury to right hand [1961]: Cardiac Pacemaker Insertion [10/2009]: Medtronic Pacemaker Implanted in [place]  Medication History Frances Furbish Johnson; 07-31-2017 3:17 PM) Nitrostat (0.4MG Tab Sublingual, place 1 tab sublingual Tab Sub Sublingual every 5 minutes as needed for chest pain., Taken starting 03/25/2017) Active. Losartan Potassium (25MG Tablet, 1 (one) Tablet Tablet Tablet Oral daily, Taken starting 08/02/2016) Active. Omeprazole (40MG Capsule DR, 1 (one) Capsule Capsule Capsul Oral once daily 30 minutes before meal as needed, Taken starting 07/30/2016) Active. Simvastatin (40MG Tablet, 1 (one) Tablet Tablet Tablet T Oral every evening after dinner, Taken starting 05/14/2015) Active. Metoprolol Tartrate (25MG Tablet, 1 Tablet Tablet Oral two times daily, Taken starting 05/14/2015) Active. Aspirin (81MG Tablet Chewable, 1 Oral daily) Active. Vitamin B12 (1 Oral daily) Specific strength unknown - Active. MetFORMIN HCl (500MG Tablet, 1 Oral daily) Active. Glimepiride (2MG Tablet, 1 Oral two times daily) Active. Clopidogrel Bisulfate (75MG Tablet, 1 Oral as needed) Active. Biotin (1000MCG Tablet, 1 Oral daily) Active. Fish Oil (1 Oral daily) Specific strength unknown - Active. Medications Reconciled (verbally with pt; no list or medication present)  Diagnostic Studies History Cheri Kearns; 07-31-17 3:20 PM) Coronary Angiogram [02/04/2015]: CAD S/P CABG 2007: 3 and history of PTCA post CABG (h/o stenting to SVG to RCA x 4 and x2 to native vessel, D1 and RI 40% ISR). Coronary angiogram  02/04/2015. SVG to OM occluded. Ost Cx to Mid Cx lesion, 90% stenosed. SVG to RCA occluded S/P  Thrombectomy and balloon PTCA. Patient may need intervention to a very large circumflex coronary artery which has a high-grade proximal stenosis. Patent LIMA to LAD. Treadmill stress test [2014]: Echocardiogram [03/08/2017]: 1. Left ventricle cavity is normal in size. Concentric hypertrophy of the left ventricle. Abnormal septal wall motion due to post-operative coronary artery bypass graft. Doppler evidence of grade I (impaired) diastolic dysfunction. Calculated EF 55%. 2. Trileaflet aortic valve with mild (Grade I) regurgitation. Moderate aortic valve leaflet calcification. 3. Structurally normal tricuspid valve with mild to moderate regurgitation. Pulmonary artery systolic pressure is estimated at 20-25 mm Hg. Colonoscopy [2015]: Within Normal Limits. Nuclear stress test [02/14/2017]: 1. The resting electrocardiogram demonstrated normal sinus rhythm and incomplete RBBB. Inferior and posterior infarct, old. Stress EKG is non-diagnostic for ischemia as it a pharmacologic stress using Lexiscan. Stress symptoms included dyspnea. Patient unable to complete stress test due to fatigue and switched to [place]. 2. Myocardial perfusion imaging is abnormal. There is a moderate area of infarction in the basal inferior, mid inferior and apical inferior myocardial wall(s). Gated SPECT imaging demonstrates akinesis of the basal inferior, mid inferior and apical inferior myocardial wall(s). The left ventricular ejection fraction was calculated or visually estimated to be 38%. This is an intermediate risk study, clinical correlation recommended.    Review of Systems Gwinda Maine FNP-C; 07/27/2017 3:55 PM) General Present- Feeling well. Not Present- Fatigue, Fever and Night Sweats. Skin Not Present- Itching and Rash. HEENT Not Present- Headache. Neck Not Present- Neck Pain. Respiratory Present- Difficulty Breathing on Exertion. Not Present- Wakes up from Sleep Wheezing or Short of Breath and  Wheezing. Cardiovascular Present- Chest Pain and Difficulty Breathing On Exertion. Not Present- Claudications, Fainting, Orthopnea and Swelling of Extremities. Gastrointestinal Not Present- Abdominal Pain, Constipation, Diarrhea, Nausea and Vomiting. 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 Not Present- Joint Swelling. Neurological Not Present- Headaches. Hematology Not Present- Blood Clots, Easy Bruising and Nose Bleed. All other systems negative  Vitals Frances Furbish Johnson; 07/27/2017 3:17 PM) 07/27/2017 3:14 PM Weight: 204.38 lb Height: 74in Body Surface Area: 2.19 m Body Mass Index: 26.24 kg/m  Pulse: 88 (Regular)  P.OX: 98% (Room air) BP: 138/82 (Sitting, Left Arm, Standard)       Physical Exam Gwinda Maine, FNP-C; 07/27/2017 3:48 PM) General Mental Status-Alert. General Appearance-Cooperative, Appears stated age, Not in acute distress. Orientation-Oriented X3. Build & Nutrition-Well built and Well nourished.  Head and Neck Thyroid Gland Characteristics - no palpable nodules, no palpable enlargement.  Chest and Lung Exam Chest and lung exam reveals -quiet, even and easy respiratory effort with no use of accessory muscles. Palpation Tender - No chest wall tenderness.  Cardiovascular Cardiovascular examination reveals -normal heart sounds, regular rate and rhythm with no murmurs and carotid auscultation reveals no bruits. Inspection Jugular vein - Right - No Distention.  Abdomen Palpation/Percussion Normal exam - Non Tender and No hepatosplenomegaly. Auscultation Normal exam - Bowel sounds normal.  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 Palpation - Edema - Bilateral - No edema. Femoral pulse - Bilateral - Normal. Popliteal pulse - Bilateral - Normal. Dorsalis pedis pulse - Bilateral - Normal. Posterior tibial pulse - Bilateral -  Feeble. Carotid arteries - Bilateral-No Carotid bruit.  Neurologic Motor-Grossly intact without any focal deficits.  Musculoskeletal Global Assessment Left Lower Extremity - normal range of motion without pain. Right Lower  Extremity - normal range of motion without pain.    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  Coronary angiogram 02/04/2015. SVG to OM occluded. Ost Cx to Mid Cx lesion, 90% stenosed. SVG to RCA occluded S/P Thrombectomy and balloon PTCA. Severe diffuse disease native RCA and small. Patent LIMA to LAD. Cx amenable for PCI.  Lexiscan myoview stress test 02/14/2017 1. The resting electrocardiogram demonstrated normal sinus rhythm and incomplete RBBB. Inferior and posterior infarct, old. Stress EKG is non-diagnostic for ischemia as it a pharmacologic stress using Lexiscan. Stress symptoms included dyspnea. Patient unable to complete stress test due to fatigue and switched to [place]. 2. Myocardial perfusion imaging is abnormal. There is a moderate area of infarction in the basal inferior, mid inferior and apical inferior myocardial wall(s). Gated SPECT imaging demonstrates akinesis of the basal inferior, mid inferior and apical inferior myocardial wall(s). The left ventricular ejection fraction was calculated or visually estimated to be 38%. This is an intermediate risk study, clinical correlation recommended. Impression: EKG 07/27/2016: Sinus rhythm at rate of 85 beats/min, left atrial enlargement, inferior infarct old. Right bundle branch block. LVH with repolarization abnormality. No significant change from EKG 07/30/2016.  EKG 02/11/2017: AV paced rhythm at rate of 60 bpm. No further analysis due to paced rhythm. Current  Plans Continued Nitrostat 0.4MG, place 1 tab sublingual Tab Sublingual every 5 minutes as needed for chest pain., #25, 25 days starting 07/27/2017, Ref. x3. Complete electrocardiogram (93000) Started Isosorbide Mononitrate ER 60MG, 1 (one) Tablet daily, #90, 90 days starting 07/27/2017, Ref. x3. Shortness of breath on exertion (R06.02) Story: Echocardiogram 03/08/2017: 1. Left ventricle cavity is normal in size. Concentric hypertrophy of the left ventricle. Abnormal septal wall motion due to post-operative coronary artery bypass graft. Doppler evidence of grade I (impaired) diastolic dysfunction. Calculated EF 55%. 2. Trileaflet aortic valve with mild (Grade I) regurgitation. Moderate aortic valve leaflet calcification. 3. Structurally normal tricuspid valve with mild to moderate regurgitation. Pulmonary artery systolic pressure is estimated at 20-25 mm Hg. Cardiac pacemaker in situ (Z95.0) Story: sick sinus syndrome Medtronic pacemaker implantation in 2011 for sick sinus syndrome. Impression: In Person Pacemaker Check 03/25/2017: AP>40%, Occasional PVC. No mode switches. Life 3.5 years. Normal function. Sick sinus syndrome (I49.5) Labwork Story: 02/01/2017: Cholesterol 127, triglycerides 127, HDL 33, LDL 69. TSH 1.89. CBC normal. Glucose 140, creatinine 1.19, potassium 4.4, CMP normal.  04/09/2016: Creatinine 1.1, Potassium 4.7, CMP normal. Cholesterol 136, HDL 33, LDL 63, Triglycerides 201. HgbA1c 6.6. H/H normal, macrocytic indicies, CBC normal.  09/21/2015: Potassium 4.9, Creatinine 1.00, CMP normal. CHolesterol 126, HDL 31, LDL 67, Triglycerides 140. TSH 2.130. CBC normal.  02/05/2015: Total cholesterol 108, triglycerides 226, HDL 21, LDL 42, serum glucose 169, creatinine 1.04, BMP otherwise normal, PLT 131, CBC otherwise normal  Note:- Recommendations:  Patient presents for acute visit today for decreased physical activity due to elevated heart rate and chest pain that is resolved with  nitroglycerin use. Angina symptoms appear to have progressed, would recommend proceeding with coronary angiogram for further evaluation. I've refilled nitroglycerin as well as started isosorbide mononitrate daily. Schedule for cardiac catheterization, and possible angioplasty. We discussed regarding risks, benefits, alternatives to this including stress testing, CTA and continued medical therapy. Patient wants to  proceed. Understands <1-2% risk of death, stroke, MI, urgent CABG, bleeding, infection, renal failure but not limited to these. We'll see him back after the procedure for further evaluation.  *I have discussed this case with Dr. Einar Gip and he personally examined the patient and participated in formulating the plan.*  CC: Dr. Deland Pretty. Addendum Note(Jagadeesh Carlynn Herald MD; 07/30/2017 6:31 AM) Labs 07/29/2016: Serum glucose 120 mg, BUN 24, creatinine 1.18, eGFR 59 mL, potassium 5.0. HB 10.8/HCT 40.3, platelets 161. Pro time normal.  Signed by Gwinda Maine, FNP-C (07/27/2017 3:56 PM)

## 2017-08-05 ENCOUNTER — Ambulatory Visit (HOSPITAL_COMMUNITY)
Admission: RE | Admit: 2017-08-05 | Discharge: 2017-08-05 | Disposition: A | Discharge status: Home / Self Care | Payer: Medicare Other | Source: Ambulatory Visit | Attending: Cardiology | Admitting: Cardiology

## 2017-08-05 ENCOUNTER — Ambulatory Visit (HOSPITAL_COMMUNITY)
Admission: RE | Disposition: A | Discharge status: Home / Self Care | Payer: Self-pay | Source: Ambulatory Visit | Attending: Cardiology

## 2017-08-05 ENCOUNTER — Encounter (HOSPITAL_COMMUNITY): Payer: Self-pay | Admitting: Cardiology

## 2017-08-05 DIAGNOSIS — I071 Rheumatic tricuspid insufficiency: Secondary | ICD-10-CM | POA: Diagnosis not present

## 2017-08-05 DIAGNOSIS — I25118 Atherosclerotic heart disease of native coronary artery with other forms of angina pectoris: Secondary | ICD-10-CM | POA: Insufficient documentation

## 2017-08-05 DIAGNOSIS — Z98811 Dental restoration status: Secondary | ICD-10-CM | POA: Diagnosis not present

## 2017-08-05 DIAGNOSIS — K219 Gastro-esophageal reflux disease without esophagitis: Secondary | ICD-10-CM | POA: Insufficient documentation

## 2017-08-05 DIAGNOSIS — I25110 Atherosclerotic heart disease of native coronary artery with unstable angina pectoris: Secondary | ICD-10-CM | POA: Diagnosis not present

## 2017-08-05 DIAGNOSIS — I10 Essential (primary) hypertension: Secondary | ICD-10-CM | POA: Diagnosis not present

## 2017-08-05 DIAGNOSIS — I495 Sick sinus syndrome: Secondary | ICD-10-CM | POA: Insufficient documentation

## 2017-08-05 DIAGNOSIS — Z950 Presence of cardiac pacemaker: Secondary | ICD-10-CM | POA: Insufficient documentation

## 2017-08-05 DIAGNOSIS — I2584 Coronary atherosclerosis due to calcified coronary lesion: Secondary | ICD-10-CM | POA: Diagnosis not present

## 2017-08-05 DIAGNOSIS — I252 Old myocardial infarction: Secondary | ICD-10-CM | POA: Diagnosis not present

## 2017-08-05 DIAGNOSIS — I272 Pulmonary hypertension, unspecified: Secondary | ICD-10-CM | POA: Insufficient documentation

## 2017-08-05 DIAGNOSIS — I25708 Atherosclerosis of coronary artery bypass graft(s), unspecified, with other forms of angina pectoris: Secondary | ICD-10-CM | POA: Diagnosis not present

## 2017-08-05 DIAGNOSIS — Z8249 Family history of ischemic heart disease and other diseases of the circulatory system: Secondary | ICD-10-CM | POA: Insufficient documentation

## 2017-08-05 DIAGNOSIS — Z7982 Long term (current) use of aspirin: Secondary | ICD-10-CM | POA: Diagnosis not present

## 2017-08-05 DIAGNOSIS — Z955 Presence of coronary angioplasty implant and graft: Secondary | ICD-10-CM | POA: Insufficient documentation

## 2017-08-05 DIAGNOSIS — E782 Mixed hyperlipidemia: Secondary | ICD-10-CM | POA: Diagnosis not present

## 2017-08-05 DIAGNOSIS — Z79899 Other long term (current) drug therapy: Secondary | ICD-10-CM | POA: Insufficient documentation

## 2017-08-05 DIAGNOSIS — E1169 Type 2 diabetes mellitus with other specified complication: Secondary | ICD-10-CM | POA: Diagnosis not present

## 2017-08-05 DIAGNOSIS — I2582 Chronic total occlusion of coronary artery: Secondary | ICD-10-CM | POA: Insufficient documentation

## 2017-08-05 DIAGNOSIS — I209 Angina pectoris, unspecified: Secondary | ICD-10-CM | POA: Diagnosis present

## 2017-08-05 DIAGNOSIS — E785 Hyperlipidemia, unspecified: Secondary | ICD-10-CM | POA: Diagnosis not present

## 2017-08-05 DIAGNOSIS — Z7984 Long term (current) use of oral hypoglycemic drugs: Secondary | ICD-10-CM | POA: Diagnosis not present

## 2017-08-05 DIAGNOSIS — Z87891 Personal history of nicotine dependence: Secondary | ICD-10-CM | POA: Insufficient documentation

## 2017-08-05 HISTORY — PX: LEFT HEART CATH AND CORS/GRAFTS ANGIOGRAPHY: CATH118250

## 2017-08-05 LAB — GLUCOSE, CAPILLARY
GLUCOSE-CAPILLARY: 104 mg/dL — AB (ref 65–99)
Glucose-Capillary: 112 mg/dL — ABNORMAL HIGH (ref 65–99)

## 2017-08-05 SURGERY — LEFT HEART CATH AND CORS/GRAFTS ANGIOGRAPHY
Anesthesia: LOCAL

## 2017-08-05 MED ORDER — SODIUM CHLORIDE 0.9% FLUSH: 3.0000 mL | Freq: Two times a day (BID) | INTRAVENOUS | Status: DC

## 2017-08-05 MED ORDER — METFORMIN HCL ER 500 MG PO TB24: 500.0000 mg | ORAL_TABLET | Freq: Every day | ORAL | 1 refills | Status: DC

## 2017-08-05 MED ORDER — SODIUM CHLORIDE 0.9 % WEIGHT BASED INFUSION
3.0000 mL/kg/h | INTRAVENOUS | Status: DC
  Administered 2017-08-05: 3 mL/kg/h via INTRAVENOUS

## 2017-08-05 MED ORDER — FENTANYL CITRATE (PF) 100 MCG/2ML IJ SOLN
INTRAMUSCULAR | Status: DC | PRN
  Administered 2017-08-05: 25 ug via INTRAVENOUS

## 2017-08-05 MED ORDER — SODIUM CHLORIDE 0.9 % WEIGHT BASED INFUSION: 1.0000 mL/kg/h | INTRAVENOUS | Status: DC

## 2017-08-05 MED ORDER — ASPIRIN 81 MG PO CHEW: 81.0000 mg | CHEWABLE_TABLET | ORAL | Status: DC

## 2017-08-05 MED ORDER — SODIUM CHLORIDE 0.9% FLUSH: 3.0000 mL | INTRAVENOUS | Status: DC | PRN

## 2017-08-05 MED ORDER — IOPAMIDOL (ISOVUE-370) INJECTION 76%
INTRAVENOUS | Status: DC | PRN
  Administered 2017-08-05: 70 mL

## 2017-08-05 MED ORDER — IOPAMIDOL (ISOVUE-370) INJECTION 76%
INTRAVENOUS | Status: AC
  Filled 2017-08-05: qty 100

## 2017-08-05 MED ORDER — LIDOCAINE HCL (PF) 1 % IJ SOLN
INTRAMUSCULAR | Status: DC | PRN
  Administered 2017-08-05: 15 mL

## 2017-08-05 MED ORDER — HEPARIN (PORCINE) IN NACL 2-0.9 UNIT/ML-% IJ SOLN
INTRAMUSCULAR | Status: DC | PRN
Start: 2017-08-05 — End: 2017-08-05
  Administered 2017-08-05: 08:00:00

## 2017-08-05 MED ORDER — MIDAZOLAM HCL 2 MG/2ML IJ SOLN
INTRAMUSCULAR | Status: DC | PRN
  Administered 2017-08-05: 1 mg via INTRAVENOUS

## 2017-08-05 MED ORDER — SODIUM CHLORIDE 0.9 % IV SOLN: 250.0000 mL | INTRAVENOUS | Status: DC | PRN

## 2017-08-05 MED ORDER — HEPARIN (PORCINE) IN NACL 2-0.9 UNIT/ML-% IJ SOLN
INTRAMUSCULAR | Status: AC
  Filled 2017-08-05: qty 1000

## 2017-08-05 MED ORDER — FENTANYL CITRATE (PF) 100 MCG/2ML IJ SOLN
INTRAMUSCULAR | Status: AC
  Filled 2017-08-05: qty 2

## 2017-08-05 MED ORDER — VERAPAMIL HCL 2.5 MG/ML IV SOLN
INTRAVENOUS | Status: AC
Start: 2017-08-05 — End: ?
  Filled 2017-08-05: qty 2

## 2017-08-05 MED ORDER — LIDOCAINE HCL (PF) 1 % IJ SOLN
INTRAMUSCULAR | Status: AC
Start: 2017-08-05 — End: ?
  Filled 2017-08-05: qty 30

## 2017-08-05 MED ORDER — HEPARIN SODIUM (PORCINE) 1000 UNIT/ML IJ SOLN
INTRAMUSCULAR | Status: AC
Start: 2017-08-05 — End: ?
  Filled 2017-08-05: qty 1

## 2017-08-05 MED ORDER — SODIUM CHLORIDE 0.9 % IV SOLN
250.0000 mL | INTRAVENOUS | Status: DC | PRN
Start: 2017-08-05 — End: 2017-08-05
  Administered 2017-08-05: 250 mL via INTRAVENOUS

## 2017-08-05 MED ORDER — MIDAZOLAM HCL 2 MG/2ML IJ SOLN
INTRAMUSCULAR | Status: AC
  Filled 2017-08-05: qty 2

## 2017-08-05 MED ORDER — NITROGLYCERIN 1 MG/10 ML FOR IR/CATH LAB
INTRA_ARTERIAL | Status: AC
  Filled 2017-08-05: qty 10

## 2017-08-05 SURGICAL SUPPLY — 11 items
CATH INFINITI 5FR JL4 (CATHETERS) ×2 IMPLANT
CATH INFINITI 5FR MPB2 (CATHETERS) ×2 IMPLANT
GLIDESHEATH SLEND A-KIT 6F 20G (SHEATH) ×2 IMPLANT
KIT HEART LEFT (KITS) ×2 IMPLANT
KIT MICROINTRODUCER STIFF 5F (SHEATH) ×2 IMPLANT
PACK CARDIAC CATHETERIZATION (CUSTOM PROCEDURE TRAY) ×2 IMPLANT
SHEATH PINNACLE 5F 10CM (SHEATH) ×4 IMPLANT
TRANSDUCER W/STOPCOCK (MISCELLANEOUS) ×2 IMPLANT
TUBING CIL FLEX 10 FLL-RA (TUBING) ×2 IMPLANT
WIRE EMERALD 3MM-J .035X150CM (WIRE) ×2 IMPLANT
WIRE HI TORQ VERSACORE-J 145CM (WIRE) ×2 IMPLANT

## 2017-08-05 NOTE — Progress Notes (Addendum)
Site area: RFA Site Prior to Removal:  Level 0 Pressure Applied For: 20 min Manual:   yes Patient Status During Pull:  stable Post Pull Site:  Level  Post Pull Instructions Given: yes  Post Pull Pulses Present: palpable Dressing Applied:  tegaderm Bedrest begins @ 0900 till 1300 Comments:

## 2017-08-05 NOTE — Interval H&P Note (Signed)
History and Physical Interval Note:  08/05/2017 7:42 AM  Jesse [name]  has presented today for surgery, with the diagnosis of angina, cad  The various methods of treatment have been discussed with the patient and family. After consideration of risks, benefits and other options for treatment, the patient has consented to  Procedure(s): LEFT HEART CATH AND CORS/GRAFTS ANGIOGRAPHY (N/A) and possible angioplasty as a surgical intervention .  The patient's history has been reviewed, patient examined, no change in status, stable for surgery.  I have reviewed the patient's chart and labs.  Questions were answered to the patient's satisfaction.   Symptom Status: Ischemic Symptoms Non-invasive Testing: Not done If no or indeterminate stress test, FFR/iFR results in all diseased vessels: Not done Diabetes Mellitus: Yes S/P CABG: Yes Antianginal therapy (number of long-acting drugs): >=2 Patient undergoing renal transplant: No Patient undergoing percutaneous valve procedure: No  LIMA-LAD patent and without significant stenoses Stenosis supplying 1 territory (bypass graft or native artery) other than anterior  PCI: Not rated  CABG: Not rated Stenoses supplying 2 territories (bypass graft or native artery, either 2 separate vessels or sequential graft supplying 2 territories) not including anterior territory  PCI: Not rated  CABG: Not rated  LIMA-LAD not patent Stenosis supplying 1 territory (bypass graft or native artery)-anterior (LAD) territory  PCI: Not rated  CABG: Not rated Stenoses supplying 2 territories (bypass graft or native artery, either 2 separate vessels or sequential graft supplying 2 territories)-LAD plus other territory  PCI: Not rated  CABG: Not rated Stenoses supplying 3 territories (bypass graft or native arteries, separate vessels, sequential grafts, or combination thereof)-LAD plus 2 other territories  PCI: Not rated  CABG: Not rated  Notes:  A indicates appropriate. M  indicates may be appropriate. R indicates rarely appropriate. Number in parentheses is median score for that indication. Reclassify indicates number of functionally diseased vessels should be decreased given negative FFR/iFR. Re-evaluate the scenario interpreting any FFR/iFR negative vessel as being not significantly stenosed.  Journal of the [employer] of Cardiology Mar 2017, 23391; DOI: 10.1016/j.[email or web address] [email or web address].full-text.pdf This App  2018 by the Society for Cardiovascular Angiography and Interventions    Adrian Prows

## 2017-08-05 NOTE — Discharge Instructions (Signed)

## 2017-08-08 MED FILL — Nitroglycerin IV Soln 100 MCG/ML in D5W: INTRA_ARTERIAL | Qty: 10 | Status: AC

## 2017-08-08 MED FILL — Verapamil HCl IV Soln 2.5 MG/ML: INTRAVENOUS | Qty: 2 | Status: AC

## 2017-08-15 DIAGNOSIS — Z950 Presence of cardiac pacemaker: Secondary | ICD-10-CM | POA: Diagnosis not present

## 2017-08-15 DIAGNOSIS — I495 Sick sinus syndrome: Secondary | ICD-10-CM | POA: Diagnosis not present

## 2017-08-15 DIAGNOSIS — R0602 Shortness of breath: Secondary | ICD-10-CM | POA: Diagnosis not present

## 2017-08-15 DIAGNOSIS — I25119 Atherosclerotic heart disease of native coronary artery with unspecified angina pectoris: Secondary | ICD-10-CM | POA: Diagnosis not present

## 2017-08-24 DIAGNOSIS — I25119 Atherosclerotic heart disease of native coronary artery with unspecified angina pectoris: Secondary | ICD-10-CM | POA: Diagnosis not present

## 2017-08-29 NOTE — H&P (Signed)
08/24/2017:  Glucose 218, creatinine 1.28, EGFR 54/62, potassium 4.8, BMP otherwise normal.  CBC normal.  INR 1.1, prothrombin time 11.2.

## 2017-08-29 NOTE — H&P (Signed)
Jesse [name] 08/15/2017 3:30 PM Location: [facility] Patient #: [id number] DOB: 05/14/1941 Single / Language: Jesse [name] / Race: White Male   History of Present Illness Jesse Page MD; 08/22/2017 8:51 PM) Patient words: Last OV 07/27/2017; 7-10 day f/u cath.  The patient is a 77 year old male who presents today for evaluation of dyspnea.  Additional reasons for visit:  Follow-up for Coronary artery disease is described as the following: He has history of known coronary artery disease and CABG in 2007 in [place] with LIMA to LAD, SVG to OM which is occluded by angiography on 02/04/2015, occluded SVG to RCA. Due to class 3-4 symptoms of angina, underwent coronary angiography on 08/06/17 and was found to have a short segment occluded large circumflex coronary artery. Discussed heavily calcified with high-grade stenosis with acute angulation followed by short segment CTO. Lesion felt to be extremely high risk and hence planned on elective revascularization. I wanted to see him in the office to further discuss this. His wife is present. He has been using nitroglycerin frequently that does resolve his chest pain. He has also noted elevated heart rate with exertion. He is requesting that I proceed with PCI as his symptoms are very much lifestyle limiting.  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 No PND or orthopnea. No leg edema.    Problem List/Past Medical [name]; 08/15/2017 3:29 PM) Controlled type 2 diabetes mellitus without complication, without long-term current use of insulin (E11.9)  History of coronary artery bypass graft x 3 (Z95.1)  CAD S/P CABG 2007: 3 and history of PTCA post CABG (h/o stenting to SVG to RCA x 4 and x2 to native vessel, D1 and RI 40% ISR). Coronary angiogram 02/04/2015. SVG to OM occluded. Ost Cx to Mid Cx lesion, 90% stenosed. SVG to RCA occluded S/P Thrombectomy  and balloon PTCA. Patient may need intervention to a very large circumflex coronary artery which has a high-grade proximal stenosis. Patent LIMA to LAD. 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 Inferior and posterior severe hypokinesis to akinesis. Mild to moderate tricuspid regurgitation, mild pulmonary hypertension. GERD without esophagitis (K21.9)  Shortness of breath on exertion (R06.02)  Echocardiogram 03/08/2017: 1. Left ventricle cavity is normal in size. Concentric hypertrophy of the left ventricle. Abnormal septal wall motion due to post-operative coronary artery bypass graft. Doppler evidence of grade I (impaired) diastolic dysfunction. Calculated EF 55%. 2. Trileaflet aortic valve with mild (Grade I) regurgitation. Moderate aortic valve leaflet calcification. 3. Structurally normal tricuspid valve with mild to moderate regurgitation. Pulmonary artery systolic pressure is estimated at 20-25 mm Hg. Labwork  Labs 07/29/2016: Serum glucose 120 mg, BUN 24, creatinine 1.18, eGFR 59 mL, potassium 5.0. HB 10.8/HCT 40.3, platelets 161. Pro time normal. 02/01/2017: Cholesterol 127, triglycerides 127, HDL 33, LDL 69. TSH 1.89. CBC normal. Glucose 140, creatinine 1.19, potassium 4.4, CMP normal. 04/09/2016: Creatinine 1.1, Potassium 4.7, CMP normal. Cholesterol 136, HDL 33, LDL 63, Triglycerides 201. HgbA1c 6.6. H/H normal, macrocytic indicies, CBC normal. 09/21/2015: Potassium 4.9, Creatinine 1.00, CMP normal. CHolesterol 126, HDL 31, LDL 67, Triglycerides 140. TSH 2.130. CBC normal. 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 Coronary angiogram 08/04/2017: SVG to OM occluded. Ost Cx to Mid Cx lesion, 90% stenosed followed by short CTO. SVG to RCA occluded. Severe diffuse disease native RCA and small and occluded in mid segment. Patent LIMA to LAD. Moderate diffuse coronary calcification. Lexiscan myoview stress test 02/14/2017 1. The resting electrocardiogram demonstrated normal sinus rhythm and incomplete RBBB. Inferior and posterior infarct, old. Stress EKG is non-diagnostic for ischemia as it a pharmacologic stress using Lexiscan. Stress symptoms included dyspnea. Patient unable to complete stress test due to fatigue and switched to [name]. 2. Myocardial perfusion imaging is abnormal. There is a moderate area of infarction in the basal inferior, mid inferior and apical inferior myocardial wall(s). Gated SPECT imaging demonstrates akinesis of the basal inferior, mid inferior and apical inferior myocardial wall(s). The left ventricular ejection fraction was calculated or visually estimated to be 38%. This is an intermediate risk study, clinical correlation recommended. Cardiac pacemaker in situ (Z95.0) [2011]: sick sinus syndrome Medtronic pacemaker implantation in 2011 for sick sinus syndrome.  Allergies [name]; 09-14-2017 3:29 PM) Brilinta *HEMATOLOGICAL AGENTS - MISC.*  Difficulty breathing.  Family History [name]; 09/14/2017 3:29 PM) Mother  Deceased. at age 57, from [place]. Known MI in her 29's. Father  Deceased. at age 39, from Stroke. No prior Heart Issues Known Brother 1  Deceased  Social History [name]; 09/14/17 3:29 PM) Current tobacco use  Former smoker. Quit at age 53 Non Drinker/No Alcohol Use  Marital status  Widowed. Lives  with Partner Living Situation  Lives with Partner Number of Children  0.  Past Surgical History [name]; 09-14-17 3:29 PM) Coronary Artery Bypass, Three  CAD S/P CABG 2007: 3 and history of PTCA post CABG (h/o stenting to SVG to RCA x 4 and x2 to native vessel, D1 and RI 40% ISR). Coronary angiogram 02/04/2015. SVG to OM occluded. SVG to RCA occluded S/P Thrombectomy and balloon PTCA. Patent LIMA to LAD. Surgery to repair injury to right hand [1961]: Cardiac Pacemaker Insertion [10/2009]: Medtronic Pacemaker Implanted in [place], [place]  Medication History Jesse Page, MD; 08/22/2017 8:53 PM) Nitrostat (0.4MG Tab Sublingual, place 1 tab sublingual Tab Sub Sublingual every 5 minutes as needed for chest pain., Taken starting 07/27/2017) Active. Isosorbide Mononitrate ER (60MG Tablet ER 24HR, 1 (one) Tablet Oral daily, Taken starting 07/27/2017) Active. Losartan Potassium (25MG Tablet, 1 (one) Tablet Tablet Tablet Oral daily, Taken starting 08/02/2016) Active. Omeprazole (40MG Capsule DR, 1 (one) Capsule Capsule Capsul Oral once daily 30 minutes before meal as needed, Taken starting 07/30/2016) Active. Simvastatin (40MG Tablet, 1 (one) Tablet Tablet Tablet T Oral every evening after dinner, Taken starting 05/14/2015) Active. Metoprolol Tartrate (25MG Tablet, 1 Tablet Tablet Tablet Oral two times daily, Taken starting 05/14/2015) Active. Aspirin (81MG Tablet Chewable, 1 Oral daily) Active. Vitamin B12 (1 Oral daily) Specific strength unknown - Active. MetFORMIN HCl (500MG Tablet, 1 Oral daily) Active. Glimepiride (2MG Tablet, 1 Oral two times daily) Active. Clopidogrel Bisulfate (75MG Tablet, 1 Oral daily) Active. Biotin (1000MCG Tablet, 1 Oral daily) Active. Fish Oil (1 Oral daily) Specific strength unknown - Active. Medications Reconciled (verbally with pt; no list or medication present)  Diagnostic Studies History Cheri Kearns; Sep 14, 2017 12:29  PM) Coronary Angiogram [08/04/2017]: SVG to OM occluded. Ost Cx to Mid Cx lesion, 90% stenosed followed by short CTO. SVG to RCA occluded. Severe diffuse disease native RCA and small and occluded in mid segment. Patent LIMA to LAD. Moderate diffuse coronary calcification. Lexiscan myoview stress test 02/14/2017 1. The resting electrocardiogram demonstrated normal sinus rhythm and incomplete RBBB. Inferior and posterior infarct, old. Stress EKG is non-diagnostic for ischemia as it a pharmacologic stress using Lexiscan. Stress symptoms included dyspnea. Patient unable to complete stress test due to fatigue and switched to [place]. 2. Myocardial perfusion imaging is abnormal. There is a moderate area of infarction in the basal inferior, mid inferior and apical inferior myocardial wall(s). Gated SPECT imaging demonstrates akinesis of the basal inferior, mid inferior and apical inferior myocardial wall(s). The left ventricular ejection fraction was calculated or visually estimated to be 38%. This is an intermediate risk study, clinical correlation recommended.    Review of Systems Jesse Page, MD; 08/22/2017 8:50 PM) General Present- Feeling well. Not Present- Fatigue, Fever and Night Sweats. Skin Not Present- Itching and Rash. HEENT Not Present- Headache. Neck Not Present- Neck Pain. Respiratory Present- Difficulty Breathing on Exertion. Not Present- Wakes up from Sleep Wheezing or Short of Breath and Wheezing. Cardiovascular Present- Chest Pain and Difficulty Breathing On Exertion. Not Present- Claudications, Fainting, Orthopnea and Swelling of Extremities. Gastrointestinal Not Present- Abdominal Pain, Constipation, Diarrhea, Nausea and Vomiting. 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 Not Present- Joint Swelling. Neurological Not Present- Headaches. Hematology Not Present- Blood Clots, Easy Bruising and Nose Bleed. All other systems negative  Vitals  [name]; 08/15/2017 3:34 PM) 08/15/2017 3:32 PM Weight: 200 lb Height: 74in Body Surface Area: 2.17 m Body Mass Index: 25.68 kg/m  Pulse: 84 (Regular)  P.OX: 100% (Room air) BP: 148/78 (Sitting, Left Arm, Standard)       Physical Exam Jesse Page, MD; 08/22/2017 8:50 PM) General Mental Status-Alert. General Appearance-Cooperative, Appears stated age, Not in acute distress. Orientation-Oriented X3. Build & Nutrition-Well built and Well nourished.  Head and Neck Thyroid Gland Characteristics - no palpable nodules, no palpable enlargement.  Chest and Lung Exam Chest and lung exam reveals -quiet, even and easy respiratory effort with no use of accessory muscles. Palpation Tender - No chest wall tenderness.  Cardiovascular Cardiovascular examination reveals -normal heart sounds, regular rate and rhythm with no murmurs and carotid auscultation reveals no bruits. Inspection Jugular vein - Right - No Distention.  Abdomen Palpation/Percussion Normal exam - Non Tender and No hepatosplenomegaly. Auscultation Normal exam - Bowel sounds normal.  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 Palpation - Edema - Bilateral - No edema. Femoral pulse - Bilateral - Normal. Popliteal pulse - Bilateral - Normal. Dorsalis pedis pulse - Bilateral - Normal. Posterior tibial pulse - Bilateral - Feeble. Carotid arteries - Bilateral-No Carotid bruit.  Neurologic Motor-Grossly intact without any focal deficits.  Musculoskeletal Global Assessment Left Lower Extremity - normal range of motion without pain. Right Lower Extremity - normal range of motion without pain.    Assessment & Plan Jesse Page MD; 08/22/2017 8:53 PM) Atherosclerosis of native coronary artery of native heart with angina pectoris (I25.119) Story: CAD S/P CABG 2007: 3 and history of PTCA post CABG (h/o  stenting to SVG to RCA x 4 and x2 to native vessel, D1 and RI 40% ISR).  Coronary angiogram 08/04/2017: SVG to OM occluded. Ost Cx to Mid Cx lesion, 90% stenosed followed  by short CTO. SVG to RCA occluded. Severe diffuse disease native RCA and small and occluded in mid segment. Patent LIMA to LAD. Moderate diffuse coronary calcification. Lexiscan myoview stress test 02/14/2017 1. The resting electrocardiogram demonstrated normal sinus rhythm and incomplete RBBB. Inferior and posterior infarct, old. Stress EKG is non-diagnostic for ischemia as it a pharmacologic stress using Lexiscan. Stress symptoms included dyspnea. Patient unable to complete stress test due to fatigue and switched to [place]. 2. Myocardial perfusion imaging is abnormal. There is a moderate area of infarction in the basal inferior, mid inferior and apical inferior myocardial wall(s). Gated SPECT imaging demonstrates akinesis of the basal inferior, mid inferior and apical inferior myocardial wall(s). The left ventricular ejection fraction was calculated or visually estimated to be 38%. This is an intermediate risk study, clinical correlation recommended. Impression: EKG 07/27/2016: Sinus rhythm at rate of 85 beats/min, left atrial enlargement, inferior infarct old. Right bundle branch block. LVH with repolarization abnormality. No significant change from EKG 07/30/2016.  EKG 02/11/2017: AV paced rhythm at rate of 60 bpm. No further analysis due to paced rhythm. Current Plans Started Ranexa 1000MG, 1 (one) Tablet two times daily, #60, 30 days starting 08/15/2017, Ref. x2. METABOLIC PANEL, BASIC (47829) CBC & PLATELETS (AUTO) (56213) PT (PROTHROMBIN TIME) (08657) Future Plans 08/24/2017: Echocardiography, transthoracic, real-time with image documentation (2D), includes M-mode recording, when performed, complete, with spectral Doppler echocardiography, and with color flow Doppler echocardiography (84696) - one time Shortness of breath on  exertion (R06.02) Story: Echocardiogram 03/08/2017: 1. Left ventricle cavity is normal in size. Concentric hypertrophy of the left ventricle. Abnormal septal wall motion due to post-operative coronary artery bypass graft. Doppler evidence of grade I (impaired) diastolic dysfunction. Calculated EF 55%. 2. Trileaflet aortic valve with mild (Grade I) regurgitation. Moderate aortic valve leaflet calcification. 3. Structurally normal tricuspid valve with mild to moderate regurgitation. Pulmonary artery systolic pressure is estimated at 20-25 mm Hg. Cardiac pacemaker in situ (Z95.0) Story: sick sinus syndrome Medtronic pacemaker implantation in 2011 for sick sinus syndrome. Impression: Scheduled Remote Pacemaker transmission 01/042019: Longevity > 3years. AP >30%, VP <1%. No mode switches. Normal pacemaker function.  Scheduled In Person Pacemaker Check 03/25/2017: AP>40%, Occasional PVC. No mode switches. Life 3.5 years. Normal function. Sick sinus syndrome (I49.5) Labwork Story: Labs 07/29/2016: Serum glucose 120 mg, BUN 24, creatinine 1.18, eGFR 59 mL, potassium 5.0. HB 10.8/HCT 40.3, platelets 161. Pro time normal.  02/01/2017: Cholesterol 127, triglycerides 127, HDL 33, LDL 69. TSH 1.89. CBC normal. Glucose 140, creatinine 1.19, potassium 4.4, CMP normal.  04/09/2016: Creatinine 1.1, Potassium 4.7, CMP normal. Cholesterol 136, HDL 33, LDL 63, Triglycerides 201. HgbA1c 6.6. H/H normal, macrocytic indicies, CBC normal.  09/21/2015: Potassium 4.9, Creatinine 1.00, CMP normal. CHolesterol 126, HDL 31, LDL 67, Triglycerides 140. TSH 2.130. CBC normal.  02/05/2015: Total cholesterol 108, triglycerides 226, HDL 21, LDL 42, serum glucose 169, creatinine 1.04, BMP otherwise normal, PLT 131, CBC otherwise normal  Note:- Recommendations:  I have reviewed the results of the recently performed cardiac catheterization again with the patient, his wife was present at the bedside. I discussed the coronary  anatomy of acute angulation and severe coronary calcification. Patient states that he has severe lifestyle limitation, and would want to attempt angioplasty. They're aware of the risks associated with this including risk of perforation, need for emergent CABG, bleeding and infection but not limited to this including less than 1% risk of death. I'll set him up for cardiac catheterization and see him back after the  procedure. Although has noticed exertional dyspnea, no clinical evidence of congestive heart failure. I would like to repeat echocardiogram prior to angiography to reevaluate his LV systolic function. His pacemaker is functioning appropriately. On appropriate and maximal medical therpy.  CC: Dr. Deland Pretty.    Signed by Jesse Page, MD (08/22/2017 8:54 PM)

## 2017-08-30 ENCOUNTER — Encounter (HOSPITAL_COMMUNITY): Payer: Self-pay | Admitting: General Practice

## 2017-08-30 ENCOUNTER — Ambulatory Visit (HOSPITAL_COMMUNITY)
Admission: RE | Admit: 2017-08-30 | Discharge: 2017-08-31 | Disposition: A | Discharge status: Home / Self Care | Payer: Medicare Other | Source: Ambulatory Visit | Attending: Cardiology | Admitting: Cardiology

## 2017-08-30 ENCOUNTER — Ambulatory Visit (HOSPITAL_COMMUNITY)
Admission: RE | Disposition: A | Discharge status: Home / Self Care | Payer: Self-pay | Source: Ambulatory Visit | Attending: Cardiology

## 2017-08-30 ENCOUNTER — Other Ambulatory Visit: Payer: Self-pay

## 2017-08-30 DIAGNOSIS — Z9861 Coronary angioplasty status: Secondary | ICD-10-CM | POA: Diagnosis present

## 2017-08-30 DIAGNOSIS — I209 Angina pectoris, unspecified: Secondary | ICD-10-CM | POA: Diagnosis present

## 2017-08-30 DIAGNOSIS — I2582 Chronic total occlusion of coronary artery: Secondary | ICD-10-CM | POA: Insufficient documentation

## 2017-08-30 DIAGNOSIS — E782 Mixed hyperlipidemia: Secondary | ICD-10-CM | POA: Diagnosis not present

## 2017-08-30 DIAGNOSIS — I10 Essential (primary) hypertension: Secondary | ICD-10-CM | POA: Insufficient documentation

## 2017-08-30 DIAGNOSIS — E119 Type 2 diabetes mellitus without complications: Secondary | ICD-10-CM | POA: Diagnosis not present

## 2017-08-30 DIAGNOSIS — Z950 Presence of cardiac pacemaker: Secondary | ICD-10-CM | POA: Insufficient documentation

## 2017-08-30 DIAGNOSIS — Z7902 Long term (current) use of antithrombotics/antiplatelets: Secondary | ICD-10-CM | POA: Diagnosis not present

## 2017-08-30 DIAGNOSIS — Z7984 Long term (current) use of oral hypoglycemic drugs: Secondary | ICD-10-CM | POA: Diagnosis not present

## 2017-08-30 DIAGNOSIS — Z87891 Personal history of nicotine dependence: Secondary | ICD-10-CM | POA: Diagnosis not present

## 2017-08-30 DIAGNOSIS — I25118 Atherosclerotic heart disease of native coronary artery with other forms of angina pectoris: Secondary | ICD-10-CM | POA: Insufficient documentation

## 2017-08-30 DIAGNOSIS — I2584 Coronary atherosclerosis due to calcified coronary lesion: Secondary | ICD-10-CM | POA: Insufficient documentation

## 2017-08-30 DIAGNOSIS — Z7982 Long term (current) use of aspirin: Secondary | ICD-10-CM | POA: Diagnosis not present

## 2017-08-30 DIAGNOSIS — E785 Hyperlipidemia, unspecified: Secondary | ICD-10-CM | POA: Diagnosis not present

## 2017-08-30 DIAGNOSIS — Z951 Presence of aortocoronary bypass graft: Secondary | ICD-10-CM | POA: Diagnosis not present

## 2017-08-30 HISTORY — PX: LEFT HEART CATH AND CORONARY ANGIOGRAPHY: CATH118249

## 2017-08-30 HISTORY — DX: Presence of cardiac pacemaker: Z95.0

## 2017-08-30 HISTORY — DX: Acute myocardial infarction, unspecified: I21.9

## 2017-08-30 HISTORY — PX: CORONARY CTO INTERVENTION: CATH118236

## 2017-08-30 HISTORY — DX: Pure hypercholesterolemia, unspecified: E78.00

## 2017-08-30 HISTORY — DX: Type 2 diabetes mellitus without complications: E11.9

## 2017-08-30 LAB — POCT ACTIVATED CLOTTING TIME
ACTIVATED CLOTTING TIME: 384 s
ACTIVATED CLOTTING TIME: 213 s
ACTIVATED CLOTTING TIME: 279 s
ACTIVATED CLOTTING TIME: 356 s
ACTIVATED CLOTTING TIME: 334 s
Activated Clotting Time: 312 seconds
Activated Clotting Time: 213 seconds
Activated Clotting Time: 274 seconds
Activated Clotting Time: 334 seconds

## 2017-08-30 LAB — GLUCOSE, CAPILLARY
Glucose-Capillary: 157 mg/dL — ABNORMAL HIGH (ref 65–99)
Glucose-Capillary: 156 mg/dL — ABNORMAL HIGH (ref 65–99)

## 2017-08-30 SURGERY — LEFT HEART CATH AND CORONARY ANGIOGRAPHY
Anesthesia: LOCAL

## 2017-08-30 MED ORDER — IOPAMIDOL (ISOVUE-370) INJECTION 76%
INTRAVENOUS | Status: AC
  Filled 2017-08-30: qty 50

## 2017-08-30 MED ORDER — TICAGRELOR 90 MG PO TABS
ORAL_TABLET | ORAL | Status: AC
  Filled 2017-08-30: qty 2

## 2017-08-30 MED ORDER — VERAPAMIL HCL 2.5 MG/ML IV SOLN
INTRAVENOUS | Status: AC
  Filled 2017-08-30: qty 2

## 2017-08-30 MED ORDER — ONDANSETRON HCL 4 MG/2ML IJ SOLN: 4.0000 mg | Freq: Four times a day (QID) | INTRAMUSCULAR | Status: DC | PRN

## 2017-08-30 MED ORDER — NITROGLYCERIN IN D5W 200-5 MCG/ML-% IV SOLN
INTRAVENOUS | Status: AC
  Filled 2017-08-30: qty 250

## 2017-08-30 MED ORDER — INSULIN ASPART 100 UNIT/ML ~~LOC~~ SOLN
0.0000 [IU] | Freq: Every day | SUBCUTANEOUS | Status: DC
Start: 2017-08-30 — End: 2017-08-31

## 2017-08-30 MED ORDER — SODIUM CHLORIDE 0.9 % WEIGHT BASED INFUSION
1.0000 mL/kg/h | INTRAVENOUS | Status: AC
  Administered 2017-08-30: 1 mL/kg/h via INTRAVENOUS

## 2017-08-30 MED ORDER — ASPIRIN 81 MG PO CHEW
81.0000 mg | CHEWABLE_TABLET | Freq: Every day | ORAL | Status: DC
  Administered 2017-08-31: 81 mg via ORAL
  Filled 2017-08-30: qty 1

## 2017-08-30 MED ORDER — HEPARIN SODIUM (PORCINE) 1000 UNIT/ML IJ SOLN
INTRAMUSCULAR | Status: AC
  Filled 2017-08-30: qty 1

## 2017-08-30 MED ORDER — NITROGLYCERIN 1 MG/10 ML FOR IR/CATH LAB
INTRA_ARTERIAL | Status: AC
  Filled 2017-08-30: qty 10

## 2017-08-30 MED ORDER — LIDOCAINE HCL (PF) 1 % IJ SOLN
INTRAMUSCULAR | Status: DC | PRN
  Administered 2017-08-30: 30 mL

## 2017-08-30 MED ORDER — TICAGRELOR 90 MG PO TABS
90.0000 mg | ORAL_TABLET | Freq: Two times a day (BID) | ORAL | Status: DC
  Administered 2017-08-31 (×2): 90 mg via ORAL
  Filled 2017-08-30 (×2): qty 1

## 2017-08-30 MED ORDER — CLOPIDOGREL BISULFATE 75 MG PO TABS
ORAL_TABLET | ORAL | Status: AC
  Administered 2017-08-30: 75 mg via ORAL
  Filled 2017-08-30: qty 1

## 2017-08-30 MED ORDER — SODIUM CHLORIDE 0.9% FLUSH: 3.0000 mL | Freq: Two times a day (BID) | INTRAVENOUS | Status: DC

## 2017-08-30 MED ORDER — SODIUM CHLORIDE 0.9 % IV SOLN
INTRAVENOUS | Status: DC
  Administered 2017-08-30: 10:00:00 via INTRAVENOUS
  Administered 2017-08-30: 500 mL via INTRAVENOUS

## 2017-08-30 MED ORDER — DORZOLAMIDE HCL-TIMOLOL MAL 2-0.5 % OP SOLN
1.0000 [drp] | Freq: Two times a day (BID) | OPHTHALMIC | Status: DC
Start: 2017-08-30 — End: 2017-08-31
  Administered 2017-08-30 – 2017-08-31 (×2): 1 [drp] via OPHTHALMIC
  Filled 2017-08-30: qty 10

## 2017-08-30 MED ORDER — LATANOPROST 0.005 % OP SOLN
1.0000 [drp] | Freq: Every day | OPHTHALMIC | Status: DC
Start: 2017-08-30 — End: 2017-08-31
  Administered 2017-08-30: 22:00:00 1 [drp] via OPHTHALMIC
  Filled 2017-08-30: qty 2.5

## 2017-08-30 MED ORDER — SODIUM CHLORIDE 0.9 % IV SOLN: 250.0000 mL | INTRAVENOUS | Status: DC | PRN

## 2017-08-30 MED ORDER — GLIMEPIRIDE 2 MG PO TABS
2.0000 mg | ORAL_TABLET | Freq: Two times a day (BID) | ORAL | Status: DC
  Administered 2017-08-31: 06:00:00 2 mg via ORAL
  Filled 2017-08-30: qty 1

## 2017-08-30 MED ORDER — SIMVASTATIN 20 MG PO TABS
40.0000 mg | ORAL_TABLET | Freq: Every day | ORAL | Status: DC
  Administered 2017-08-30 – 2017-08-31 (×2): 40 mg via ORAL
  Filled 2017-08-30 (×2): qty 2

## 2017-08-30 MED ORDER — IOHEXOL 350 MG/ML SOLN
INTRAVENOUS | Status: DC | PRN
  Administered 2017-08-30: 90 mL via INTRA_ARTERIAL

## 2017-08-30 MED ORDER — TICAGRELOR 90 MG PO TABS
ORAL_TABLET | ORAL | Status: DC | PRN
  Administered 2017-08-30: 180 mg via ORAL

## 2017-08-30 MED ORDER — LIDOCAINE HCL (PF) 1 % IJ SOLN
INTRAMUSCULAR | Status: AC
  Filled 2017-08-30: qty 30

## 2017-08-30 MED ORDER — MIDAZOLAM HCL 2 MG/2ML IJ SOLN
INTRAMUSCULAR | Status: AC
  Filled 2017-08-30: qty 2

## 2017-08-30 MED ORDER — INSULIN ASPART 100 UNIT/ML ~~LOC~~ SOLN
0.0000 [IU] | Freq: Three times a day (TID) | SUBCUTANEOUS | Status: DC
Start: 2017-08-31 — End: 2017-08-31

## 2017-08-30 MED ORDER — ASPIRIN 81 MG PO CHEW: 81.0000 mg | CHEWABLE_TABLET | ORAL | Status: DC

## 2017-08-30 MED ORDER — SODIUM CHLORIDE 0.9% FLUSH: 3.0000 mL | INTRAVENOUS | Status: DC | PRN

## 2017-08-30 MED ORDER — MIDAZOLAM HCL 2 MG/2ML IJ SOLN
INTRAMUSCULAR | Status: DC | PRN
  Administered 2017-08-30: 1 mg via INTRAVENOUS

## 2017-08-30 MED ORDER — CLOPIDOGREL BISULFATE 75 MG PO TABS
75.0000 mg | ORAL_TABLET | Freq: Once | ORAL | Status: AC
  Administered 2017-08-30: 75 mg via ORAL

## 2017-08-30 MED ORDER — ACETAMINOPHEN 325 MG PO TABS: 650.0000 mg | ORAL_TABLET | ORAL | Status: DC | PRN

## 2017-08-30 MED ORDER — LOSARTAN POTASSIUM 25 MG PO TABS
25.0000 mg | ORAL_TABLET | Freq: Every day | ORAL | Status: DC
  Administered 2017-08-31: 25 mg via ORAL
  Filled 2017-08-30: qty 1

## 2017-08-30 MED ORDER — ANGIOPLASTY BOOK
Freq: Once | Status: AC
  Administered 2017-08-30: 22:00:00 1
  Filled 2017-08-30: qty 1

## 2017-08-30 MED ORDER — HEPARIN (PORCINE) IN NACL 2-0.9 UNIT/ML-% IJ SOLN
INTRAMUSCULAR | Status: AC
  Filled 2017-08-30: qty 1000

## 2017-08-30 MED ORDER — HEPARIN (PORCINE) IN NACL 2-0.9 UNIT/ML-% IJ SOLN
INTRAMUSCULAR | Status: AC | PRN
  Administered 2017-08-30: 1000 mL

## 2017-08-30 MED ORDER — FENTANYL CITRATE (PF) 100 MCG/2ML IJ SOLN
INTRAMUSCULAR | Status: DC | PRN
  Administered 2017-08-30: 25 ug via INTRAVENOUS

## 2017-08-30 MED ORDER — FENTANYL CITRATE (PF) 100 MCG/2ML IJ SOLN
INTRAMUSCULAR | Status: AC
  Filled 2017-08-30: qty 2

## 2017-08-30 MED ORDER — IOPAMIDOL (ISOVUE-370) INJECTION 76%
INTRAVENOUS | Status: DC | PRN
  Administered 2017-08-30: 55 mL via INTRA_ARTERIAL

## 2017-08-30 MED ORDER — SODIUM CHLORIDE 0.9% FLUSH
3.0000 mL | Freq: Two times a day (BID) | INTRAVENOUS | Status: DC
  Administered 2017-08-31: 3 mL via INTRAVENOUS

## 2017-08-30 MED ORDER — HEPARIN SODIUM (PORCINE) 1000 UNIT/ML IJ SOLN
INTRAMUSCULAR | Status: DC | PRN
  Administered 2017-08-30: 3000 [IU] via INTRAVENOUS
  Administered 2017-08-30: 8000 [IU] via INTRAVENOUS
  Administered 2017-08-30: 3000 [IU] via INTRAVENOUS

## 2017-08-30 MED ORDER — NITROGLYCERIN 0.4 MG SL SUBL: 0.4000 mg | SUBLINGUAL_TABLET | SUBLINGUAL | Status: DC | PRN

## 2017-08-30 MED ORDER — METOPROLOL TARTRATE 12.5 MG HALF TABLET
25.0000 mg | ORAL_TABLET | Freq: Two times a day (BID) | ORAL | Status: DC
  Administered 2017-08-30 – 2017-08-31 (×2): 25 mg via ORAL
  Filled 2017-08-30 (×2): qty 2

## 2017-08-30 SURGICAL SUPPLY — 40 items
BALLN EMERGE MR 2.0X20 (BALLOONS) ×2
BALLN EMERGE MR 3.0X15 (BALLOONS) ×2
BALLN EMERGE MR PUSH 1.5X12 (BALLOONS) ×2
BALLN EMERGE MR PUSH 1.5X15 (BALLOONS) ×2
BALLN SPRINTER MX2 OTW 1.5X6 (BALLOONS) ×2
BALLN WOLVERINE 2.25X10 (BALLOONS) ×2
BALLOON EMERGE MR 2.0X20 (BALLOONS) ×1 IMPLANT
BALLOON EMERGE MR 3.0X15 (BALLOONS) ×1 IMPLANT
BALLOON EMERGE MR PUSH 1.5X12 (BALLOONS) ×1 IMPLANT
BALLOON EMERGE MR PUSH 1.5X15 (BALLOONS) ×1 IMPLANT
BALLOON SPRINTER MX2 OTW 1.5X6 (BALLOONS) ×1 IMPLANT
BALLOON WOLVERINE 2.25X10 (BALLOONS) ×1 IMPLANT
CATH LAUNCHER 6FR AL2 (CATHETERS) ×1 IMPLANT
CATH SUPERCROSS ANGLED 90 DEG (MICROCATHETER) ×2 IMPLANT
CATH TRAPPER 6-8F (CATHETERS) ×2 IMPLANT
CATH TURNPIKE 135CM (CATHETERS) ×2 IMPLANT
CATH TURNPIKE LP 150CM (CATHETERS) ×2 IMPLANT
CATH VISTA GUIDE 6FR XB3.5 (CATHETERS) ×2 IMPLANT
CATHETER LAUNCHER 6FR AL2 (CATHETERS) ×2
COVER PRB 48X5XTLSCP FOLD TPE (BAG) ×1 IMPLANT
COVER PROBE 5X48 (BAG) ×1
DEVICE CLOSURE MYNXGRIP 6/7F (Vascular Products) ×2 IMPLANT
ELECT DEFIB PAD ADLT CADENCE (PAD) ×2 IMPLANT
KIT ENCORE 26 ADVANTAGE (KITS) ×2 IMPLANT
KIT HEART LEFT (KITS) ×2 IMPLANT
KIT HEMO VALVE WATCHDOG (MISCELLANEOUS) ×2 IMPLANT
KIT MICROINTRODUCER STIFF 5F (SHEATH) ×2 IMPLANT
PACK CARDIAC CATHETERIZATION (CUSTOM PROCEDURE TRAY) ×2 IMPLANT
PINNACLE LONG 7F 25CM (SHEATH) ×2
SHEATH INTRO PINNACLE 7F 25CM (SHEATH) ×1 IMPLANT
SHEATH PINNACLE 6F 10CM (SHEATH) ×2 IMPLANT
TRANSDUCER W/STOPCOCK (MISCELLANEOUS) ×2 IMPLANT
TUBING ART PRESS 72  MALE/FEM (TUBING) ×1
TUBING ART PRESS 72 MALE/FEM (TUBING) ×1 IMPLANT
TUBING CIL FLEX 10 FLL-RA (TUBING) ×2 IMPLANT
WIRE ASAHI MIRACLEBROS-3 180CM (WIRE) ×2 IMPLANT
WIRE EMERALD 3MM-J .035X150CM (WIRE) ×2 IMPLANT
WIRE FIGHTER CROSSING 190CM (WIRE) ×2 IMPLANT
WIRE MARVEL STR TIP 190CM (WIRE) ×6 IMPLANT
WIRE SAMURAI STR TIP 190CM (WIRE) ×2 IMPLANT

## 2017-08-30 NOTE — Interval H&P Note (Signed)
History and Physical Interval Note:  08/30/2017 1:52 PM  Jesse [name]  has presented today for surgery, with the diagnosis of angina - cad  The various methods of treatment have been discussed with the patient and family. After consideration of risks, benefits and other options for treatment, the patient has consented to  Procedure(s): LEFT HEART CATH AND CORONARY ANGIOGRAPHY (N/A) angioplasty as a surgical intervention .  The patient's history has been reviewed, patient examined, no change in status, stable for surgery.  I have reviewed the patient's chart and labs.  Questions were answered to the patient's satisfaction.    Symptom Status: Ischemic Symptoms Non-invasive Testing: Not done If no or indeterminate stress test, FFR/iFR results in all diseased vessels: Not done Diabetes Mellitus: Yes S/P CABG: Yes Antianginal therapy (number of long-acting drugs): >=2 Patient undergoing renal transplant: No Patient undergoing percutaneous valve procedure: No  LIMA-LAD patent and without significant stenoses Stenosis supplying 1 territory (bypass graft or native artery) other than anterior  PCI: Not rated  CABG: Not rated Stenoses supplying 2 territories (bypass graft or native artery, either 2 separate vessels or sequential graft supplying 2 territories) not including anterior territory  PCI: Not rated  CABG: Not rated  LIMA-LAD not patent Stenosis supplying 1 territory (bypass graft or native artery)-anterior (LAD) territory  PCI: Not rated  CABG: Not rated Stenoses supplying 2 territories (bypass graft or native artery, either 2 separate vessels or sequential graft supplying 2 territories)-LAD plus other territory  PCI: Not rated  CABG: Not rated Stenoses supplying 3 territories (bypass graft or native arteries, separate vessels, sequential grafts, or combination thereof)-LAD plus 2 other territories  PCI: Not rated  CABG: Not rated  Notes:  A indicates appropriate. M indicates may  be appropriate. R indicates rarely appropriate. Number in parentheses is median score for that indication. Reclassify indicates number of functionally diseased vessels should be decreased given negative FFR/iFR. Re-evaluate the scenario interpreting any FFR/iFR negative vessel as being not significantly stenosed.  Journal of the [employer] of Cardiology Mar 2017, 23391; DOI: 10.1016/j.jacc.2017.02.001 [email or web address].[email or web address].full-text.pdf This App  2018 by the Society for Cardiovascular Angiography and Interventions  Adrian Prows

## 2017-08-31 ENCOUNTER — Encounter (HOSPITAL_COMMUNITY): Payer: Self-pay | Admitting: Cardiology

## 2017-08-31 DIAGNOSIS — E782 Mixed hyperlipidemia: Secondary | ICD-10-CM | POA: Diagnosis not present

## 2017-08-31 DIAGNOSIS — I25118 Atherosclerotic heart disease of native coronary artery with other forms of angina pectoris: Secondary | ICD-10-CM | POA: Diagnosis not present

## 2017-08-31 DIAGNOSIS — Z7984 Long term (current) use of oral hypoglycemic drugs: Secondary | ICD-10-CM | POA: Diagnosis not present

## 2017-08-31 DIAGNOSIS — Z7982 Long term (current) use of aspirin: Secondary | ICD-10-CM | POA: Diagnosis not present

## 2017-08-31 DIAGNOSIS — Z7902 Long term (current) use of antithrombotics/antiplatelets: Secondary | ICD-10-CM | POA: Diagnosis not present

## 2017-08-31 DIAGNOSIS — Z951 Presence of aortocoronary bypass graft: Secondary | ICD-10-CM | POA: Diagnosis not present

## 2017-08-31 DIAGNOSIS — I10 Essential (primary) hypertension: Secondary | ICD-10-CM | POA: Diagnosis not present

## 2017-08-31 DIAGNOSIS — E119 Type 2 diabetes mellitus without complications: Secondary | ICD-10-CM | POA: Diagnosis not present

## 2017-08-31 DIAGNOSIS — Z950 Presence of cardiac pacemaker: Secondary | ICD-10-CM | POA: Diagnosis not present

## 2017-08-31 DIAGNOSIS — I2582 Chronic total occlusion of coronary artery: Secondary | ICD-10-CM | POA: Diagnosis not present

## 2017-08-31 DIAGNOSIS — Z87891 Personal history of nicotine dependence: Secondary | ICD-10-CM | POA: Diagnosis not present

## 2017-08-31 DIAGNOSIS — I2584 Coronary atherosclerosis due to calcified coronary lesion: Secondary | ICD-10-CM | POA: Diagnosis not present

## 2017-08-31 LAB — BASIC METABOLIC PANEL
Anion gap: 10 (ref 5–15)
BUN: 18 mg/dL (ref 6–20)
CALCIUM: 8.6 mg/dL — AB (ref 8.9–10.3)
CO2: 19 mmol/L — ABNORMAL LOW (ref 22–32)
CREATININE: 1.31 mg/dL — AB (ref 0.61–1.24)
Chloride: 108 mmol/L (ref 101–111)
GFR, EST AFRICAN AMERICAN: 59 mL/min — AB (ref >60)
GFR, EST NON AFRICAN AMERICAN: 51 mL/min — AB (ref >60)
GLUCOSE: 166 mg/dL — AB (ref 65–99)
Potassium: 4.7 mmol/L (ref 3.5–5.1)
Sodium: 137 mmol/L (ref 135–145)

## 2017-08-31 LAB — CBC
HCT: 35.2 % — ABNORMAL LOW (ref 39.0–52.0)
HEMOGLOBIN: 12 g/dL — AB (ref 13.0–17.0)
MCH: 32.3 pg (ref 26.0–34.0)
MCHC: 34.1 g/dL (ref 30.0–36.0)
MCV: 94.9 fL (ref 78.0–100.0)
PLATELETS: 122 10*3/uL — AB (ref 150–400)
RBC: 3.71 MIL/uL — ABNORMAL LOW (ref 4.22–5.81)
RDW: 12.9 % (ref 11.5–15.5)
WBC: 6.2 10*3/uL (ref 4.0–10.5)

## 2017-08-31 LAB — GLUCOSE, CAPILLARY
Glucose-Capillary: 83 mg/dL (ref 65–99)
Glucose-Capillary: 154 mg/dL — ABNORMAL HIGH (ref 65–99)

## 2017-08-31 MED ORDER — TICAGRELOR 90 MG PO TABS: 90.0000 mg | ORAL_TABLET | Freq: Two times a day (BID) | ORAL | 3 refills | Status: DC

## 2017-08-31 NOTE — Progress Notes (Signed)
#  1.  S/W CHRISTINE @ [facility] # [phone] OPT- 2   BRILINTA  90 MG BID  COVER- YES  CO-PAY- $ 47.00  TIER- 3 DRUG  PRIOR APPROVAL- NO  DEDUCTIBLE : HAS BEEN MET   PREFERRED PHARMACY : [facility] AND [facility]

## 2017-08-31 NOTE — Progress Notes (Signed)
CARDIAC REHAB PHASE I   PRE:  Rate/Rhythm: 67 SR  BP:  Supine: 97/46  Sitting:   Standing:    SaO2:   MODE:  Ambulation: 600 ft   POST:  Rate/Rhythm: 97  BP:  Supine:   Sitting: 135/70  Standing:    SaO2: 98%RA [id number] Pt walked 600 ft with steady gait and tolerated well. No CP. Stressed importance of briiinta with stent. Reviewed NTG use, ex ed and CRP 2. Gave heart healthy and diabetic diets. Will refer to [place] program.   Graylon Good, RN BSN  08/31/2017 9:35 AM

## 2017-08-31 NOTE — Care Management Note (Addendum)
Case Management Note  Patient Details  Name: Issachar Broady MRN: [id number] Date of Birth: 1940-08-30  Subjective/Objective:  From home s/p coronary CTO intervention , will be on brilinta. NCM awaiting benefit check for brilinta.  Co pay is 47.00 per benefit check and his pharmacy [facility] has it in stock.                  Action/Plan: DC home today.   Expected Discharge Date:  08/31/17               Expected Discharge Plan:  Home/Self Care  In-House Referral:     Discharge planning Services  CM Consult, Medication Assistance  Post Acute Care Choice:    Choice offered to:     DME Arranged:    DME Agency:     HH Arranged:    HH Agency:     Status of Service:  Completed, signed off  If discussed at [employer] of Stay Meetings, dates discussed:    Additional Comments:  Zenon Mayo, RN 08/31/2017, 9:11 AM

## 2017-08-31 NOTE — Discharge Summary (Signed)
Physician Discharge Summary  Patient ID: Jesse [name] MRN: [id number] DOB/AGE: 27-Jun-1941 77 y.o.  Admit date: 08/30/2017 Discharge date: 08/31/2017  Admission Diagnoses:  Discharge Diagnoses:  Active Problems:   Angina pectoris ([place])   Post PTCA   Discharged Condition: stable  Hospital Course:  Patient underwent complex PTCA to proximal LCx. His plavix was switched to brilinta. Groin looks good this morning. Will consider starting xarelto 2/5 mg bid outpatient, given   Consults: None  Significant Diagnostic Studies: Labs Results for Jesse, [name] (MRN [id number]) as of 08/31/2017 09:03  Ref. Range 08/31/2017 44:03  BASIC METABOLIC PANEL Unknown Rpt (A)  Sodium Latest Ref Range: 135 - 145 mmol/L 137  Potassium Latest Ref Range: 3.5 - 5.1 mmol/L 4.7  Chloride Latest Ref Range: 101 - 111 mmol/L 108  CO2 Latest Ref Range: 22 - 32 mmol/L 19 (L)  Glucose Latest Ref Range: 65 - 99 mg/dL 166 (H)  BUN Latest Ref Range: 6 - 20 mg/dL 18  Creatinine Latest Ref Range: 0.61 - 1.24 mg/dL 1.31 (H)  Calcium Latest Ref Range: 8.9 - 10.3 mg/dL 8.6 (L)  Anion gap Latest Ref Range: 5 - 15  10  GFR, Est Non African American Latest Ref Range: >60 mL/min 51 (L)  GFR, Est African American Latest Ref Range: >60 mL/min 59 (L)    Treatments:  Procedure performed: PTCA and balloon angioplasty of CTO proximal and mid circumflex coronary artery. Extremely complex procedure. Prolonged procedure.  Severely calcified CTO, multiple devices used. Wires utilized included [employer], Fighter, [employer] 3, to cross the lesion. Balloons utilized included 1.5 x 12 mm emerge, 1.5 x 15 mm emerge, 2.0 x 20 mm emerge at 16 atmospheric pressure for 60 seconds 2. Lesion left with balloon angioplasty only with establishment of TIMI 3 flow improved from  from TIMI 0 flow.  Recommendation: I will start the patient on Brilinta and also aspirin. He will also benefit from being on Xarelto 2.5 mg twice a day for  at least a short period to reduce the risk of acute and subacute vessel thrombosis until healing occurs.  Patient needs f/u due to radiation exposure and approximately 1.5Gy exposure and will be watched for acute injury and also followed up closely in the office. 145 mL contrast utilized.      Discharge Exam: Blood pressure (!) 93/53, pulse 60, temperature 97.6 F (36.4 C), temperature source Oral, resp. rate 17, height 6\' 2"  (1.88 m), weight 90.7 kg (200 lb), SpO2 97 %.  General Mental Status-Alert. General Appearance-Cooperative, Appears stated age, Not in acute distress. Orientation-Oriented X3. Build & Nutrition-Well built and Well nourished.  Head and Neck Thyroid Gland Characteristics - no palpable nodules, no palpable enlargement.  Chest and Lung Exam Chest and lung exam reveals -quiet, even and easy respiratory effort with no use of accessory muscles. Palpation Tender - No chest wall tenderness.  Cardiovascular Cardiovascular examination reveals -normal heart sounds, regular rate and rhythm with no murmurs and carotid auscultation reveals no bruits. Inspection Jugular vein - Right - No Distention.  Abdomen Palpation/Percussion Normal exam - Non Tender and No hepatosplenomegaly. Auscultation Normal exam - Bowel sounds normal.  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 Palpation - Edema - Bilateral - No edema. Femoral pulse - Bilateral - Normal. Popliteal pulse - Bilateral - Normal. Dorsalis pedis pulse - Bilateral - Normal. Posterior tibial pulse - Bilateral - Feeble. Carotid arteries - Bilateral-No Carotid bruit.  Neurologic Motor-Grossly intact  without any focal deficits.  Musculoskeletal Global Assessment Left Lower Extremity - normal range of motion without pain. Right Lower Extremity - normal range of motion without pain.    Disposition: 01-Home or Self  Care  Discharge Instructions    Diet - low sodium heart healthy   Complete by:  As directed    Increase activity slowly   Complete by:  As directed      Allergies as of 08/31/2017   No Known Allergies     Medication List    STOP taking these medications   clopidogrel 75 MG tablet Commonly known as:  PLAVIX     TAKE these medications   aspirin 81 MG chewable tablet Chew 1 tablet (81 mg total) by mouth daily.   Biotin 5000 MCG Tabs Take 5,000 mcg by mouth daily.   dorzolamide-timolol 22.3-6.8 MG/ML ophthalmic solution Commonly known as:  COSOPT Place 1 drop into both eyes 2 (two) times daily.   FISH OIL PO Take 1 capsule by mouth daily.   glimepiride 2 MG tablet Commonly known as:  AMARYL Take 2 mg by mouth 2 (two) times daily.   isosorbide mononitrate 60 MG 24 hr tablet Commonly known as:  IMDUR Take 60 mg by mouth daily.   latanoprost 0.005 % ophthalmic solution Commonly known as:  XALATAN Place 1 drop into both eyes at bedtime.   losartan 25 MG tablet Commonly known as:  COZAAR Take 25 mg by mouth daily.   metFORMIN 500 MG 24 hr tablet Commonly known as:  GLUCOPHAGE-XR Take 1 tablet (500 mg total) by mouth daily.   metoprolol tartrate 25 MG tablet Commonly known as:  LOPRESSOR Take 0.5 tablets (12.5 mg total) by mouth 3 (three) times daily. What changed:    how much to take  when to take this   nitroGLYCERIN 0.4 MG SL tablet Commonly known as:  NITROSTAT Place 0.4 mg under the tongue every 5 (five) minutes as needed for chest pain.   simvastatin 40 MG tablet Commonly known as:  ZOCOR Take 40 mg by mouth daily.   ticagrelor 90 MG Tabs tablet Commonly known as:  BRILINTA Take 1 tablet (90 mg total) by mouth 2 (two) times daily.   vitamin B-12 1000 MCG tablet Commonly known as:  CYANOCOBALAMIN Take 1,000 mcg by mouth daily.        SignedNigel Mormon 08/31/2017, 9:01 AM   Nigel Mormon, MD [facility].  [place] Pager: [phone] Office: [phone] If no answer Cell [phone]

## 2017-09-01 ENCOUNTER — Telehealth (HOSPITAL_COMMUNITY): Payer: Self-pay

## 2017-09-01 MED FILL — Heparin Sodium (Porcine) 2 Unit/ML in Sodium Chloride 0.9%: INTRAMUSCULAR | Qty: 1000 | Status: AC

## 2017-09-01 MED FILL — Nitroglycerin IV Soln 100 MCG/ML in D5W: INTRA_ARTERIAL | Qty: 10 | Status: AC

## 2017-09-01 MED FILL — Verapamil HCl IV Soln 2.5 MG/ML: INTRAVENOUS | Qty: 4 | Status: AC

## 2017-09-01 MED FILL — Nitroglycerin IV Soln 200 MCG/ML in D5W: INTRAVENOUS | Qty: 250 | Status: AC

## 2017-09-01 NOTE — Telephone Encounter (Signed)
Patients insurance is active and benefits verified through Medicare Part A & B - No co-pay, deductible amount of $185.00/$185.00 has been met, no out of pocket, 20% co-insurance, and no pre-authorization is required. Passport/reference [id number]  Patients insurance is active and benefits verified through [place] - No co-pay, no deductible, no out of pocket, no co-insurance, and no pre-authorization is required. Passport/reference [id number]  Patient will be contacted and scheduled after review by the RN Navigator.

## 2017-09-02 ENCOUNTER — Emergency Department (HOSPITAL_COMMUNITY)
Admission: EM | Admit: 2017-09-02 | Discharge: 2017-09-02 | Disposition: A | Discharge status: Home / Self Care | Payer: Medicare Other | Attending: Emergency Medicine | Admitting: Emergency Medicine

## 2017-09-02 ENCOUNTER — Other Ambulatory Visit: Payer: Self-pay

## 2017-09-02 ENCOUNTER — Encounter (HOSPITAL_COMMUNITY): Payer: Self-pay

## 2017-09-02 ENCOUNTER — Emergency Department (HOSPITAL_COMMUNITY): Payer: Medicare Other

## 2017-09-02 DIAGNOSIS — R05 Cough: Secondary | ICD-10-CM | POA: Diagnosis not present

## 2017-09-02 DIAGNOSIS — Z7984 Long term (current) use of oral hypoglycemic drugs: Secondary | ICD-10-CM | POA: Insufficient documentation

## 2017-09-02 DIAGNOSIS — Z79899 Other long term (current) drug therapy: Secondary | ICD-10-CM | POA: Diagnosis not present

## 2017-09-02 DIAGNOSIS — R0602 Shortness of breath: Secondary | ICD-10-CM | POA: Insufficient documentation

## 2017-09-02 DIAGNOSIS — R072 Precordial pain: Secondary | ICD-10-CM | POA: Diagnosis not present

## 2017-09-02 DIAGNOSIS — Z950 Presence of cardiac pacemaker: Secondary | ICD-10-CM | POA: Diagnosis not present

## 2017-09-02 DIAGNOSIS — E119 Type 2 diabetes mellitus without complications: Secondary | ICD-10-CM | POA: Insufficient documentation

## 2017-09-02 DIAGNOSIS — I959 Hypotension, unspecified: Secondary | ICD-10-CM | POA: Diagnosis not present

## 2017-09-02 DIAGNOSIS — Z7982 Long term (current) use of aspirin: Secondary | ICD-10-CM | POA: Insufficient documentation

## 2017-09-02 DIAGNOSIS — Z87891 Personal history of nicotine dependence: Secondary | ICD-10-CM | POA: Insufficient documentation

## 2017-09-02 DIAGNOSIS — I2510 Atherosclerotic heart disease of native coronary artery without angina pectoris: Secondary | ICD-10-CM | POA: Diagnosis not present

## 2017-09-02 DIAGNOSIS — I10 Essential (primary) hypertension: Secondary | ICD-10-CM | POA: Insufficient documentation

## 2017-09-02 DIAGNOSIS — Z951 Presence of aortocoronary bypass graft: Secondary | ICD-10-CM | POA: Diagnosis not present

## 2017-09-02 DIAGNOSIS — I252 Old myocardial infarction: Secondary | ICD-10-CM | POA: Diagnosis not present

## 2017-09-02 DIAGNOSIS — Z955 Presence of coronary angioplasty implant and graft: Secondary | ICD-10-CM | POA: Insufficient documentation

## 2017-09-02 DIAGNOSIS — R079 Chest pain, unspecified: Secondary | ICD-10-CM | POA: Diagnosis not present

## 2017-09-02 DIAGNOSIS — I25119 Atherosclerotic heart disease of native coronary artery with unspecified angina pectoris: Secondary | ICD-10-CM | POA: Diagnosis not present

## 2017-09-02 LAB — CBC
HEMATOCRIT: 36.7 % — AB (ref 39.0–52.0)
HEMOGLOBIN: 12.7 g/dL — AB (ref 13.0–17.0)
MCH: 33 pg (ref 26.0–34.0)
MCHC: 34.6 g/dL (ref 30.0–36.0)
MCV: 95.3 fL (ref 78.0–100.0)
Platelets: 132 10*3/uL — ABNORMAL LOW (ref 150–400)
RBC: 3.85 MIL/uL — AB (ref 4.22–5.81)
RDW: 13.1 % (ref 11.5–15.5)
WBC: 4.4 10*3/uL (ref 4.0–10.5)

## 2017-09-02 LAB — I-STAT TROPONIN, ED
TROPONIN I, POC: 0.06 ng/mL (ref 0.00–0.08)
Troponin i, poc: 0.06 ng/mL (ref 0.00–0.08)

## 2017-09-02 LAB — BASIC METABOLIC PANEL
ANION GAP: 10 (ref 5–15)
BUN: 17 mg/dL (ref 6–20)
CALCIUM: 9.3 mg/dL (ref 8.9–10.3)
CHLORIDE: 106 mmol/L (ref 101–111)
CO2: 23 mmol/L (ref 22–32)
Creatinine, Ser: 1.3 mg/dL — ABNORMAL HIGH (ref 0.61–1.24)
GFR calc non Af Amer: 51 mL/min — ABNORMAL LOW (ref >60)
GFR, EST AFRICAN AMERICAN: 59 mL/min — AB (ref >60)
Glucose, Bld: 163 mg/dL — ABNORMAL HIGH (ref 65–99)
POTASSIUM: 4.5 mmol/L (ref 3.5–5.1)
Sodium: 139 mmol/L (ref 135–145)

## 2017-09-02 MED ORDER — SODIUM CHLORIDE 0.9 % IV BOLUS (SEPSIS)
500.0000 mL | Freq: Once | INTRAVENOUS | Status: AC
  Administered 2017-09-02: 500 mL via INTRAVENOUS

## 2017-09-02 NOTE — Discharge Instructions (Signed)
1.  Call Dr. Irven Shelling office to schedule a follow-up appointment.  Discontinue your Imdur and losartan as instructed by Dr. Einar Gip. 2.  Return to the emergency department if you have new or worsening symptoms.

## 2017-09-02 NOTE — ED Provider Notes (Signed)
[facility] EMERGENCY DEPARTMENT Provider Note   CSN: [id number] Arrival date & time: 09/02/17  1506     History   Chief Complaint Chief Complaint  Patient presents with  . Chest Pain    HPI Jesse [name] is a 77 y.o. male.  HPI Patient reports he had a cardiac catheterization 2\5\19 by Dr. Einar Gip.  He had cardiac stents placed.  He reports last night he awakened during the night with chest pressure and feeling short of breath.  He tried a nitroglycerin which did help the discomfort but his pressure got low.  He reports it was 25s over 2s.  He called Dr. Einar Gip this afternoon and at 230 took a second nitroglycerin per his instructions.  And was instructed to come to the emergency department for further evaluation.  Patient denies any chest pain at this time.  He is no longer short of breath.  He has not had lower extremity swelling or calf pain.  No fever.  He reports he had slight cough this morning. Past Medical History:  Diagnosis Date  . Coronary artery disease   . High cholesterol   . Hypertension   . Myocardial infarction [facility]) 2017/2018?  Marland Kitchen Presence of permanent cardiac pacemaker   . Type II diabetes mellitus [facility])     Patient Active Problem List   Diagnosis Date Noted  . Essential hypertension   . HLD (hyperlipidemia)   . Hypertriglyceridemia   . Type 2 diabetes mellitus with complication ([place])   . Angina pectoris ([place]) 02/03/2015  . Post PTCA 02/03/2015  . Diabetes mellitus ([place]) 02/03/2015  . NSTEMI (non-ST elevated myocardial infarction) ([place]) 02/03/2015  . Hx of sick sinus syndrome   . 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 CARDIAC CATHETERIZATION N/A 02/04/2015   Procedure: Left Heart Cath and Cors/Grafts Angiography;  Surgeon: Adrian Prows, MD;  Location: [place] CV LAB;  Service: Cardiovascular;  Laterality: N/A;  . CARDIAC CATHETERIZATION N/A 02/04/2015   Procedure: Coronary Stent Intervention;   Surgeon: Adrian Prows, MD;  Location: [place] CV LAB;  Service: Cardiovascular;  Laterality: N/A;  . CORONARY ANGIOPLASTY    . CORONARY ANGIOPLASTY WITH STENT PLACEMENT     "i've got 5-6 stents total" (08/30/2017)  . CORONARY ARTERY BYPASS GRAFT  2007   CABG X3  . CORONARY CTO INTERVENTION N/A 08/30/2017   Procedure: CORONARY CTO INTERVENTION;  Surgeon: Adrian Prows, MD;  Location: [place] CV LAB;  Service: Cardiovascular;  Laterality: N/A;  . INSERT / REPLACE / REMOVE PACEMAKER  ?2012   "I think it was placed in 2012"  . LEFT HEART CATH AND CORONARY ANGIOGRAPHY N/A 08/30/2017   Procedure: LEFT HEART CATH AND CORONARY ANGIOGRAPHY;  Surgeon: Adrian Prows, MD;  Location: [place] CV LAB;  Service: Cardiovascular;  Laterality: N/A;  . LEFT HEART CATH AND CORS/GRAFTS ANGIOGRAPHY N/A 08/05/2017   Procedure: LEFT HEART CATH AND CORS/GRAFTS ANGIOGRAPHY;  Surgeon: Adrian Prows, MD;  Location: [place] CV LAB;  Service: Cardiovascular;  Laterality: N/A;       Home Medications    Prior to Admission medications   Medication Sig Start Date End Date Taking? Authorizing Provider  aspirin 81 MG chewable tablet Chew 1 tablet (81 mg total) by mouth daily. 02/06/15   Allie Bossier, MD  Biotin 5000 MCG TABS Take 5,000 mcg by mouth daily.    [provider]  dorzolamide-timolol (COSOPT) 22.3-6.8 MG/ML ophthalmic solution Place  1 drop into both eyes 2 (two) times daily. 05/22/17   [provider]  glimepiride (AMARYL) 2 MG tablet Take 2 mg by mouth 2 (two) times daily.    [provider]  isosorbide mononitrate (IMDUR) 60 MG 24 hr tablet Take 60 mg by mouth daily. 07/27/17   [provider]  latanoprost (XALATAN) 0.005 % ophthalmic solution Place 1 drop into both eyes at bedtime. 06/04/17   [provider]  losartan (COZAAR) 25 MG tablet Take 25 mg by mouth daily.    [provider]  metFORMIN (GLUCOPHAGE-XR) 500 MG 24 hr tablet Take 1 tablet (500 mg total)  by mouth daily. 08/07/17   Adrian Prows, MD  metoprolol tartrate (LOPRESSOR) 25 MG tablet Take 0.5 tablets (12.5 mg total) by mouth 3 (three) times daily. Patient taking differently: Take 25 mg by mouth 2 (two) times daily.  02/06/15   Allie Bossier, MD  nitroGLYCERIN (NITROSTAT) 0.4 MG SL tablet Place 0.4 mg under the tongue every 5 (five) minutes as needed for chest pain.  07/27/17   [provider]  Omega-3 Fatty Acids (FISH OIL PO) Take 1 capsule by mouth daily.    [provider]  simvastatin (ZOCOR) 40 MG tablet Take 40 mg by mouth daily.    [provider]  ticagrelor (BRILINTA) 90 MG TABS tablet Take 1 tablet (90 mg total) by mouth 2 (two) times daily. 08/31/17   Patwardhan, Reynold Bowen, MD  vitamin B-12 (CYANOCOBALAMIN) 1000 MCG tablet Take 1,000 mcg by mouth daily.    [provider]    Family History History reviewed. No pertinent family history.  Social History Social History   Tobacco Use  . Smoking status: Former Smoker    Years: 3.00    Types: Cigarettes    Last attempt to quit: 1960    Years since quitting: 59.1  . Smokeless tobacco: Never Used  Substance Use Topics  . Alcohol use: No  . Drug use: No     Allergies   Patient has no known allergies.   Review of Systems Review of Systems 10 Systems reviewed and are negative for acute change except as noted in the HPI.   Physical Exam Updated Vital Signs BP (!) 107/51   Pulse 62   Resp 18   SpO2 98%   Physical Exam  Constitutional: He is oriented to person, place, and time. He appears well-developed and well-nourished.  HENT:  Head: Normocephalic and atraumatic.  Eyes: Conjunctivae are normal.  Neck: Neck supple.  Cardiovascular: Normal rate and regular rhythm.  No murmur heard. Pulmonary/Chest: Effort normal and breath sounds normal. No respiratory distress.  Abdominal: Soft. There is no tenderness.  Musculoskeletal: He exhibits no edema or tenderness.  Neurological: He  is alert and oriented to person, place, and time. No cranial nerve deficit. He exhibits normal muscle tone. Coordination normal.  Skin: Skin is warm and dry.  Psychiatric: He has a normal mood and affect.  Nursing note and vitals reviewed.    ED Treatments / Results  Labs (all labs ordered are listed, but only abnormal results are displayed) Labs Reviewed  BASIC METABOLIC PANEL - Abnormal; Notable for the following components:      Result Value   Glucose, Bld 163 (*)    Creatinine, Ser 1.30 (*)    GFR calc non Af Amer 51 (*)    GFR calc Af Amer 59 (*)    All other components within normal limits  CBC - Abnormal;  Notable for the following components:   RBC 3.85 (*)    Hemoglobin 12.7 (*)    HCT 36.7 (*)    Platelets 132 (*)    All other components within normal limits  I-STAT TROPONIN, ED  I-STAT TROPONIN, ED    EKG  EKG Interpretation  Date/Time:  Friday September 02 2017 15:12:17 EST Ventricular Rate:  71 PR Interval:  154 QRS Duration: 150 QT Interval:  458 QTC Calculation: 497 R Axis:   -42 Text Interpretation:  Normal sinus rhythm Left axis deviation Left ventricular hypertrophy with QRS widening Cannot rule out Septal infarct , age undetermined Possible Lateral infarct , age undetermined Inferior infarct , age undetermined Abnormal ECG V1 Twave inversion compaared to previous Confirmed by Charlesetta Shanks [phone]) on 09/02/2017 5:07:52 PM       Radiology Dg Chest 2 View  Result Date: 09/02/2017 CLINICAL DATA:  Chest pain and pressure EXAM: CHEST  2 VIEW COMPARISON:  02/03/2015 FINDINGS: Cardiac shadow is stable. Pacing device is again seen. Postoperative changes are noted. The lungs are clear bilaterally. Degenerative changes of the thoracic spine are noted. IMPRESSION: No active cardiopulmonary disease. Electronically Signed   By: Inez Catalina M.D.   On: 09/02/2017 16:13    Procedures Procedures (including critical care time)  Medications Ordered in ED Medications   sodium chloride 0.9 % bolus 500 mL (0 mLs Intravenous Stopped 09/02/17 1845)     Initial Impression / Assessment and Plan / ED Course  I have reviewed the triage vital signs and the nursing notes.  Pertinent labs & imaging results that were available during my care of the patient were reviewed by me and considered in my medical decision making (see chart for details).     Consult: Reviewed with Dr. Einar Gip.  Feels the patient is stable for discharge.  Advises to discontinue Imdur and losartan at this time.  Final Clinical Impressions(s) / ED Diagnoses   Final diagnoses:  Precordial pain  Hypotension, unspecified hypotension type  Coronary artery disease involving native heart, angina presence unspecified, unspecified vessel or lesion type   Patient presents as well and above with chest discomfort and shortness of breath during the night last night.  He had taken nitroglycerin.  He was experiencing low blood pressures through the day.  He is pain-free with no shortness of breath.  Cardiac enzymes negative.  Patient had stent placed 3 days ago.  Patient will follow up with Dr. Einar Gip.  The recommendation was to hold imdur and losartan. ED Discharge Orders    None       Charlesetta Shanks, MD 09/02/17 [phone]

## 2017-09-02 NOTE — ED Notes (Signed)
Attempted PIV x 2; no success. Second RN to attempt.

## 2017-09-02 NOTE — ED Notes (Signed)
Patient transported to X-ray 

## 2017-09-02 NOTE — ED Triage Notes (Signed)
Pt states he had cardiac cath on 08/30/17 and had stents placed. He states he was having chest pressure today, shortness of breath. Pt states he took a nitro, also reports hypotension. Vitals stable in triage.

## 2017-09-08 DIAGNOSIS — R0602 Shortness of breath: Secondary | ICD-10-CM | POA: Diagnosis not present

## 2017-09-08 DIAGNOSIS — Z950 Presence of cardiac pacemaker: Secondary | ICD-10-CM | POA: Diagnosis not present

## 2017-09-08 DIAGNOSIS — I25119 Atherosclerotic heart disease of native coronary artery with unspecified angina pectoris: Secondary | ICD-10-CM | POA: Diagnosis not present

## 2017-09-21 DIAGNOSIS — I2510 Atherosclerotic heart disease of native coronary artery without angina pectoris: Secondary | ICD-10-CM | POA: Diagnosis not present

## 2017-09-21 DIAGNOSIS — Z125 Encounter for screening for malignant neoplasm of prostate: Secondary | ICD-10-CM | POA: Diagnosis not present

## 2017-09-21 DIAGNOSIS — Z955 Presence of coronary angioplasty implant and graft: Secondary | ICD-10-CM | POA: Diagnosis not present

## 2017-09-21 DIAGNOSIS — I10 Essential (primary) hypertension: Secondary | ICD-10-CM | POA: Diagnosis not present

## 2017-09-21 DIAGNOSIS — E119 Type 2 diabetes mellitus without complications: Secondary | ICD-10-CM | POA: Diagnosis not present

## 2017-10-10 DIAGNOSIS — E1169 Type 2 diabetes mellitus with other specified complication: Secondary | ICD-10-CM | POA: Diagnosis not present

## 2017-10-10 DIAGNOSIS — I10 Essential (primary) hypertension: Secondary | ICD-10-CM | POA: Diagnosis not present

## 2017-10-10 DIAGNOSIS — R0602 Shortness of breath: Secondary | ICD-10-CM | POA: Diagnosis not present

## 2017-10-10 DIAGNOSIS — I25119 Atherosclerotic heart disease of native coronary artery with unspecified angina pectoris: Secondary | ICD-10-CM | POA: Diagnosis not present

## 2017-10-11 DIAGNOSIS — E1169 Type 2 diabetes mellitus with other specified complication: Secondary | ICD-10-CM | POA: Diagnosis not present

## 2017-10-11 DIAGNOSIS — I25119 Atherosclerotic heart disease of native coronary artery with unspecified angina pectoris: Secondary | ICD-10-CM | POA: Diagnosis not present

## 2017-10-17 NOTE — H&P (Signed)
OFFICE VISIT NOTES COPIED TO EPIC FOR DOCUMENTATION  . History of Present Illness [name] MD; 10/10/2017 10:22 AM) Patient words: Last OV 09/08/2017; 3 week f/u.  The patient is a 77 year old male who presents for a Follow-up for Dyspnea.  Additional reasons for visit:  Follow-up for Coronary artery disease is described as the following: Mr. Zeev Deakins is a Caucasian male with coronary artery disease and CABG in 2007 in [place] with LIMA to LAD, SVG to OM which is occluded by angiography on 02/04/2015, occluded SVG to RCA. He underwent complex Cx CTO PCI and balloon angioplasty on 08/30/2017. 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  He was seen in the emergency room 2 days following PCI for low blood pressure and dizziness, losartan has been on hold since then. He continues to have exertional dyspnea but he has not had any angina pectoris with exertion, he is also not been able to resume full activity due to dyspnea.  I seen him about 3 weeks ago and I had recommended that the repeat angiography as suspicion for restenosis and reocclusion of a very large unprotected circumflex was very high and this time to proceed with rotational atherectomy followed by stenting. He wanted to wait for 2 weeks as he had some work to be done in [place] and now presents for follow-up. No change in symptoms.   Problem List/Past Medical Frances Furbish Johnson; 10/10/2017 9:30 AM) Controlled type 2 diabetes mellitus without complication, without long-term current use of insulin (E11.9)  History of coronary artery bypass graft x 3 (Z95.1)  CAD S/P CABG 2007: 3 and history of PTCA post CABG (h/o stenting to SVG to RCA x 4 and x2 to native vessel, D1 and RI 40% ISR). Coronary angiogram 02/04/2015. SVG to OM occluded. Ost Cx to Mid Cx lesion, 90% stenosed. SVG to RCA  occluded S/P Thrombectomy and balloon PTCA. Patient may need intervention to a very large circumflex coronary artery which has a high-grade proximal stenosis. Patent LIMA to LAD. 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 Inferior and posterior severe hypokinesis to akinesis. Mild to moderate tricuspid regurgitation, mild pulmonary hypertension. GERD without esophagitis (K21.9)  Shortness of breath on exertion (R06.02)  Echocardiogram 08/24/2017: Left ventricle cavity is normal in size. Moderate concentric hypertrophy of the left ventricle. Visual EF is 45-50%. Abnormal septal wall motion due to post-operative coronary artery bypass graft. Doppler evidence of grade I (impaired) diastolic dysfunction, normal LAP. Mild (Grade I) aortic regurgitation. Mild (Grade I) mitral regurgitation. Inadequate tricuspid regurgitation jet to estimate pulmonary artery pressure. Normal right atrial pressure. Compared to prior study dated 98/26/4158, LV systolic function is mildly reduced. Labwork  08/24/2017: Glucose 218, creatinine 1.28, EGFR 54/62, potassium 4.8, BMP otherwise normal. CBC normal. INR 1.1, prothrombin time 11.2. Labs 07/29/2016: Serum glucose 120 mg, BUN 24, creatinine 1.18, eGFR 59 mL, potassium 5.0. HB 10.8/HCT 40.3, platelets 161. Pro time normal. 02/01/2017: Cholesterol 127, triglycerides 127, HDL 33, LDL 69. TSH 1.89. CBC normal. Glucose 140, creatinine 1.19, potassium 4.4, CMP normal. 04/09/2016: Creatinine 1.1, Potassium 4.7, CMP normal. Cholesterol 136, HDL 33, LDL 63, Triglycerides 201. HgbA1c 6.6. H/H normal, macrocytic indicies, CBC normal. 09/21/2015: Potassium 4.9, Creatinine 1.00, CMP normal. CHolesterol 126, HDL 31,  LDL 67, Triglycerides 140. TSH 2.130. CBC normal. 02/05/2015: Total  cholesterol 108, triglycerides 226, HDL 21, LDL 42, serum glucose 169, creatinine 1.04, BMP otherwise normal, PLT 131, CBC otherwise normal Sick sinus syndrome (I49.5)  Encounter for care of pacemaker (Z45.018)  Atherosclerosis of native coronary artery of native heart with angina pectoris (I25.119) [2007]: CAD S/P CABG 2007: 3 and history of PTCA post CABG (h/o stenting to SVG to RCA x 4 and x2 to native vessel, D1 and RI 40% ISR). Coronary angiogram 08/30/17: Successful CTO PTCA with balloon angioplasty only. 08/04/2017: SVG to RCA occluded. Severe diffuse disease native RCA and small and occluded in mid segment. SVG to OM occluded. Ost Cx to Mid Cx lesion, 90% stenosed followed by short CTO. Patent LIMA to LAD. Moderate diffuse coronary calcification. Lexiscan myoview stress test 02/14/2017 1. The resting electrocardiogram demonstrated normal sinus rhythm and incomplete RBBB. Inferior and posterior infarct, old. Stress EKG is non-diagnostic for ischemia as it a pharmacologic stress using Lexiscan. Stress symptoms included dyspnea. Patient unable to complete stress test due to fatigue and switched to [place]. 2. Myocardial perfusion imaging is abnormal. There is a moderate area of infarction in the basal inferior, mid inferior and apical inferior myocardial wall(s). Gated SPECT imaging demonstrates akinesis of the basal inferior, mid inferior and apical inferior myocardial wall(s). The left ventricular ejection fraction was calculated or visually estimated to be 38%. This is an intermediate risk study, clinical correlation recommended. Cardiac pacemaker in situ (Z95.0) [2011]: sick sinus syndrome Medtronic pacemaker implantation in 2011 for sick sinus syndrome.  Allergies Frances Furbish Johnson; Oct 21, 2017 9:30 AM) [name] *HEMATOLOGICAL AGENTS - MISC.*  Difficulty breathing.  Family History Frances Furbish Johnson; October 21, 2017 9:30 AM) Mother  Deceased. at age 71, from [place]. Known MI in her 21's. Father   Deceased. at age 23, from Stroke. No prior Heart Issues Known Brother 1  Deceased  Social History Cheri Kearns; 2017/10/21 9:30 AM) Current tobacco use  Former smoker. Quit at age 62 Non Drinker/No Alcohol Use  Marital status  Widowed. Lives with Partner Living Situation  Lives with Partner Number of Children  0.  Past Surgical History Frances Furbish Wynetta Emery; October 21, 2017 9:30 AM) Coronary Artery Bypass, Three  CAD S/P CABG 2007: 3 and history of PTCA post CABG (h/o stenting to SVG to RCA x 4 and x2 to native vessel, D1 and RI 40% ISR). Coronary angiogram 02/04/2015. SVG to OM occluded. SVG to RCA occluded S/P Thrombectomy and balloon PTCA. Patent LIMA to LAD. Surgery to repair injury to right hand [1961]: Cardiac Pacemaker Insertion [10/2009]: Medtronic Pacemaker Implanted in [place], [place]  Medication History Frances Furbish Wynetta Emery; 10-21-17 9:37 AM) Nitrostat (0.4MG Tab Sublingual, place 1 tab sublingual Tab Sub Sublingual every 5 minutes as needed for chest pain., Taken starting 07/27/2017) Active. Omeprazole (40MG Capsule DR, 1 (one) Capsule Capsule Capsul Oral as needed, Taken starting 07/30/2016) Active. Fish Oil (1 Oral daily) Specific strength unknown - Active. Dorzolamide HCl-Timolol Mal (22.3-6.8MG/ML Solution, Ophthalmic daily) Active. Latanoprost (0.005% Solution, Ophthalmic daily) Active. Zostrix HP (0.1% Cream, External prn) Active. Brilinta (90MG Tablet, 1 Oral two times daily) Active. Biotin (1000MCG Tablet, 1 Oral daily) Active. Vitamin B12 (1 Oral daily) Specific strength unknown - Active. Simvastatin (40MG Tablet, 1 (one) Tablet Tablet Tablet T Oral every evening after dinner, Taken starting 05/14/2015) Active. Xarelto (2.5MG Tablet, 1 Tablet Oral two times daily, Taken starting 09/08/2017) Active. Glimepiride (2MG Tablet, 1 Oral two times daily) Active. MetFORMIN HCl (500MG Tablet, 1 Oral daily) Active. Metoprolol Tartrate (  25MG Tablet, 1 Tablet Tablet  Tablet Tablet Oral two times daily, Taken starting 05/14/2015) Active. Losartan Potassium (25MG Tablet, 1 (one) Tablet Tablet Tablet Oral daily, Taken starting 08/02/2016) Active. (holding) Aspirin (81MG Tablet Chewable, 1 Oral daily) Active. Medications Reconciled (meds present)  Diagnostic Studies History Frances Furbish Johnson; Oct 24, 2017 9:30 AM) Coronary Angiogram  Coronary angiogram 08/30/17: Successful CTO PTCA with balloon angioplasty only. 08/04/2017: SVG to RCA occluded. Severe diffuse disease native RCA and small and occluded in mid segment. SVG to OM occluded. Ost Cx to Mid Cx lesion, 90% stenosed followed by short CTO. Patent LIMA to LAD. Moderate diffuse coronary calcification Echocardiogram  08/24/2017: Left ventricle cavity is normal in size. Moderate concentric hypertrophy of the left ventricle. Visual EF is 45-50%. Abnormal septal wall motion due to post-operative coronary artery bypass graft. Doppler evidence of grade I (impaired) diastolic dysfunction, normal LAP. Mild (Grade I) aortic regurgitation. Mild (Grade I) mitral regurgitation. Inadequate tricuspid regurgitation jet to estimate pulmonary artery pressure. Normal right atrial pressure. Compared to prior study dated 83/41/9622, LV systolic function is mildly reduced. Nuclear stress test [02/14/2017]: 1. The resting electrocardiogram demonstrated normal sinus rhythm and incomplete RBBB. Inferior and posterior infarct, old. Stress EKG is non-diagnostic for ischemia as it a pharmacologic stress using Lexiscan. Stress symptoms included dyspnea. Patient unable to complete stress test due to fatigue and switched to [place]. 2. Myocardial perfusion imaging is abnormal. There is a moderate area of infarction in the basal inferior, mid inferior and apical inferior myocardial wall(s). Gated SPECT imaging demonstrates akinesis of the basal inferior, mid inferior and apical inferior myocardial wall(s). The left ventricular ejection fraction  was calculated or visually estimated to be 38%. This is an intermediate risk study, clinical correlation recommended. Colonoscopy [2015]: Within Normal Limits.    Review of Systems [name], MD; 2017/10/24 10:23 AM) General Present- Feeling well. Not Present- Fatigue, Fever and Night Sweats. Skin Not Present- Itching and Rash. HEENT Not Present- Headache. Neck Not Present- Neck Pain. Respiratory Not Present- Wakes up from Sleep Wheezing or Short of Breath and Wheezing. Cardiovascular Present- Difficulty Breathing On Exertion (improved). Not Present- Chest Pain, Claudications, Fainting, Orthopnea and Swelling of Extremities. Gastrointestinal Not Present- Abdominal Pain, Constipation, Diarrhea, Nausea and Vomiting. 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 Not Present- Joint Swelling. Neurological Not Present- Headaches. Hematology Not Present- Blood Clots, Easy Bruising and Nose Bleed. All other systems negative  Vitals Frances Furbish Johnson; 10-24-2017 9:39 AM) 2017-10-24 9:31 AM Weight: 196.56 lb Height: 74in Body Surface Area: 2.16 m Body Mass Index: 25.24 kg/m  Pulse: 67 (Regular)  P.OX: 98% (Room air) BP: 140/80 (Sitting, Left Arm, Standard)       Physical Exam [name], MD; 10/24/2017 10:23 AM) General Mental Status-Alert. General Appearance-Cooperative, Appears stated age, Not in acute distress. Orientation-Oriented X3. Build & Nutrition-Well built and Well nourished.  Head and Neck Thyroid Gland Characteristics - no palpable nodules, no palpable enlargement.  Chest and Lung Exam Chest and lung exam reveals -quiet, even and easy respiratory effort with no use of accessory muscles. Palpation Tender - No chest wall tenderness.  Cardiovascular Cardiovascular examination reveals -normal heart sounds, regular rate and rhythm with no murmurs and carotid auscultation reveals no bruits. Inspection Jugular  vein - Right - No Distention.  Abdomen Palpation/Percussion Normal exam - Non Tender and No hepatosplenomegaly. Auscultation Normal exam - Bowel sounds normal.  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 Palpation - Edema - Bilateral -  No edema. Femoral pulse - Bilateral - Normal. Popliteal pulse - Bilateral - Normal. Dorsalis pedis pulse - Bilateral - Normal. Posterior tibial pulse - Bilateral - Feeble. Carotid arteries - Bilateral-No Carotid bruit.  Neurologic Motor-Grossly intact without any focal deficits.  Musculoskeletal Global Assessment Left Lower Extremity - normal range of motion without pain. Right Lower Extremity - normal range of motion without pain.    Assessment & Plan [name] MD; 10/10/2017 10:23 AM) Atherosclerosis of native coronary artery of native heart with angina pectoris (I25.119) Story: CAD S/P CABG 2007: 3 and history of PTCA post CABG (h/o stenting to SVG to RCA x 4 and x2 to native vessel, D1 and RI 40% ISR).  Coronary angiogram 08/30/17: Successful CTO PTCA with balloon angioplasty only. 08/04/2017: SVG to RCA occluded. Severe diffuse disease native RCA and small and occluded in mid segment. SVG to OM occluded. Ost Cx to Mid Cx lesion, 90% stenosed followed by short CTO. Patent LIMA to LAD. Moderate diffuse coronary calcification.  Lexiscan myoview stress test 02/14/2017 1. The resting electrocardiogram demonstrated normal sinus rhythm and incomplete RBBB. Inferior and posterior infarct, old. Stress EKG is non-diagnostic for ischemia as it a pharmacologic stress using Lexiscan. Stress symptoms included dyspnea. Patient unable to complete stress test due to fatigue and switched to [place]. 2. Myocardial perfusion imaging is abnormal. There is a moderate area of infarction in the basal inferior, mid inferior and apical inferior myocardial wall(s). Gated SPECT imaging  demonstrates akinesis of the basal inferior, mid inferior and apical inferior myocardial wall(s). The left ventricular ejection fraction was calculated or visually estimated to be 38%. This is an intermediate risk study, clinical correlation recommended. Impression: EKG 09/08/2017: Normal sinus rhythm at rate of 25 bpm, inferior infarct old, posterior infarct old. IVCD, LVH. No evidence of ischemia. No significant change from EKG 07/27/2016  EKG 02/11/2017: AV paced rhythm at rate of 60 bpm. No further analysis due to paced rhythm. Current Plans METABOLIC PANEL, BASIC (74259) CBC & PLATELETS (AUTO) (56387) PT (PROTHROMBIN TIME) (56433) Shortness of breath on exertion (R06.02) Story: Echocardiogram 08/24/2017: Left ventricle cavity is normal in size. Moderate concentric hypertrophy of the left ventricle. Visual EF is 45-50%. Abnormal septal wall motion due to post-operative coronary artery bypass graft. Doppler evidence of grade I (impaired) diastolic dysfunction, normal LAP. Mild (Grade I) aortic regurgitation. Mild (Grade I) mitral regurgitation. Inadequate tricuspid regurgitation jet to estimate pulmonary artery pressure. Normal right atrial pressure. Compared to prior study dated 29/51/8841, LV systolic function is mildly reduced. Benign essential hypertension (I10) Story: Echocardiogram 02/04/2015: Mild decrease in LV systolic function, ejection fraction 45%. Inferior and posterior severe hypokinesis to akinesis. Mild to moderate tricuspid regurgitation, mild pulmonary hypertension. Current Plans Changed Losartan Potassium 25MG, 1 (one) Tablet daily, #90, 10/10/2017, Ref. Christophe Louis Story: 08/24/2017: Glucose 218, creatinine 1.28, EGFR 54/62, potassium 4.8, BMP otherwise normal. CBC normal. INR 1.1, prothrombin time 11.2.  Labs 07/29/2016: Serum glucose 120 mg, BUN 24, creatinine 1.18, eGFR 59 mL, potassium 5.0. HB 10.8/HCT 40.3, platelets 161. Pro time normal.  02/01/2017: Cholesterol  127, triglycerides 127, HDL 33, LDL 69. TSH 1.89. CBC normal. Glucose 140, creatinine 1.19, potassium 4.4, CMP normal.  04/09/2016: Creatinine 1.1, Potassium 4.7, CMP normal. Cholesterol 136, HDL 33, LDL 63, Triglycerides 201. HgbA1c 6.6. H/H normal, macrocytic indicies, CBC normal.  09/21/2015: Potassium 4.9, Creatinine 1.00, CMP normal. CHolesterol 126, HDL 31, LDL 67, Triglycerides 140. TSH 2.130. CBC normal.  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 Teigan Sahli is a Caucasian male with coronary artery disease and CABG in 2007 in [place] with LIMA to LAD, SVG to OM which is occluded by angiography on 02/04/2015, occluded SVG to RCA. He underwent complex Cx CTO PCI and balloon angioplasty on 08/30/2017. 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  As he is still having shortness breath and his activity is slightly limited, I will set him up for repeat angiography and angioplasty. Fortunately he has not had any angina pectoris but he does have anginal equivalent. He also has not been exerting himself much I do not think that the PCI would last longer, he has extreme high risk for restenosis, schedule him for repeat procedure and consider rotational atherectomy followed by stenting. I will check lipid profile along with preprocedural labs.  It's a very important and a large artery and SVG to OM is occluded chronically. All questions answered. Wife is present at the bedside. Continue aspirin, and also Xarelto 2.5 mg b.i.d. He is on triple therapy due to high risk of subacute closure of heavily calcified CTO that he  underwent angioplasty only. Restart losartan as blood pressure is now stable.  CC: Dr. Deland Pretty.  10/13/2017: Glucose 121, creatinine 1.04, EGFR 69/80, potassium 4.5, BMP otherwise normal.  CBC normal.  INR 1.1, prothrombin time 11.3.  Cholesterol 125, triglycerides 173, HDL 29, LDL 61.  Signed by [name], MD (10/10/2017 10:24 AM)

## 2017-10-18 ENCOUNTER — Ambulatory Visit (HOSPITAL_COMMUNITY)
Admission: RE | Admit: 2017-10-18 | Discharge: 2017-10-18 | Disposition: A | Discharge status: Home / Self Care | Payer: Medicare Other | Source: Ambulatory Visit | Attending: Cardiology | Admitting: Cardiology

## 2017-10-18 ENCOUNTER — Ambulatory Visit (HOSPITAL_COMMUNITY)
Admission: RE | Disposition: A | Discharge status: Home / Self Care | Payer: Self-pay | Source: Ambulatory Visit | Attending: Cardiology

## 2017-10-18 DIAGNOSIS — Z7984 Long term (current) use of oral hypoglycemic drugs: Secondary | ICD-10-CM | POA: Diagnosis not present

## 2017-10-18 DIAGNOSIS — E782 Mixed hyperlipidemia: Secondary | ICD-10-CM | POA: Diagnosis not present

## 2017-10-18 DIAGNOSIS — Z87891 Personal history of nicotine dependence: Secondary | ICD-10-CM | POA: Insufficient documentation

## 2017-10-18 DIAGNOSIS — Z7982 Long term (current) use of aspirin: Secondary | ICD-10-CM | POA: Insufficient documentation

## 2017-10-18 DIAGNOSIS — I25708 Atherosclerosis of coronary artery bypass graft(s), unspecified, with other forms of angina pectoris: Secondary | ICD-10-CM | POA: Insufficient documentation

## 2017-10-18 DIAGNOSIS — Z79899 Other long term (current) drug therapy: Secondary | ICD-10-CM | POA: Insufficient documentation

## 2017-10-18 DIAGNOSIS — E119 Type 2 diabetes mellitus without complications: Secondary | ICD-10-CM | POA: Insufficient documentation

## 2017-10-18 DIAGNOSIS — Z7901 Long term (current) use of anticoagulants: Secondary | ICD-10-CM | POA: Diagnosis not present

## 2017-10-18 DIAGNOSIS — I25110 Atherosclerotic heart disease of native coronary artery with unstable angina pectoris: Secondary | ICD-10-CM | POA: Diagnosis not present

## 2017-10-18 DIAGNOSIS — Z950 Presence of cardiac pacemaker: Secondary | ICD-10-CM | POA: Insufficient documentation

## 2017-10-18 DIAGNOSIS — I209 Angina pectoris, unspecified: Secondary | ICD-10-CM | POA: Diagnosis present

## 2017-10-18 DIAGNOSIS — I2584 Coronary atherosclerosis due to calcified coronary lesion: Secondary | ICD-10-CM | POA: Insufficient documentation

## 2017-10-18 HISTORY — PX: CORONARY ATHERECTOMY: CATH118238

## 2017-10-18 HISTORY — PX: CORONARY ANGIOGRAPHY: CATH118303

## 2017-10-18 LAB — GLUCOSE, CAPILLARY
GLUCOSE-CAPILLARY: 144 mg/dL — AB (ref 65–99)
GLUCOSE-CAPILLARY: 116 mg/dL — AB (ref 65–99)

## 2017-10-18 LAB — POCT ACTIVATED CLOTTING TIME
Activated Clotting Time: 296 seconds
Activated Clotting Time: 268 seconds
Activated Clotting Time: 285 seconds

## 2017-10-18 SURGERY — CORONARY ANGIOGRAPHY (CATH LAB)
Anesthesia: LOCAL

## 2017-10-18 MED ORDER — IOPAMIDOL (ISOVUE-370) INJECTION 76%
INTRAVENOUS | Status: AC
  Filled 2017-10-18: qty 50

## 2017-10-18 MED ORDER — LIDOCAINE HCL (PF) 1 % IJ SOLN
INTRAMUSCULAR | Status: DC | PRN
  Administered 2017-10-18: 2 mL

## 2017-10-18 MED ORDER — ONDANSETRON HCL 4 MG/2ML IJ SOLN: 4.0000 mg | Freq: Four times a day (QID) | INTRAMUSCULAR | Status: DC | PRN

## 2017-10-18 MED ORDER — HEPARIN (PORCINE) IN NACL 2-0.9 UNIT/ML-% IJ SOLN
INTRAMUSCULAR | Status: AC | PRN
  Administered 2017-10-18 (×2): 500 mL

## 2017-10-18 MED ORDER — SODIUM CHLORIDE 0.9 % WEIGHT BASED INFUSION
3.0000 mL/kg/h | INTRAVENOUS | Status: AC
  Administered 2017-10-18: 3 mL/kg/h via INTRAVENOUS

## 2017-10-18 MED ORDER — FENTANYL CITRATE (PF) 100 MCG/2ML IJ SOLN
INTRAMUSCULAR | Status: AC
Start: 2017-10-18 — End: ?
  Filled 2017-10-18: qty 2

## 2017-10-18 MED ORDER — SODIUM CHLORIDE 0.9% FLUSH: 3.0000 mL | INTRAVENOUS | Status: DC | PRN

## 2017-10-18 MED ORDER — MIDAZOLAM HCL 2 MG/2ML IJ SOLN
INTRAMUSCULAR | Status: AC
  Filled 2017-10-18: qty 2

## 2017-10-18 MED ORDER — HYDRALAZINE HCL 20 MG/ML IJ SOLN: 5.0000 mg | INTRAMUSCULAR | Status: DC | PRN

## 2017-10-18 MED ORDER — HEPARIN SODIUM (PORCINE) 1000 UNIT/ML IJ SOLN
INTRAMUSCULAR | Status: DC | PRN
  Administered 2017-10-18: 2000 [IU] via INTRAVENOUS
  Administered 2017-10-18: 3000 [IU] via INTRAVENOUS
  Administered 2017-10-18: 6000 [IU] via INTRAVENOUS

## 2017-10-18 MED ORDER — NITROGLYCERIN 1 MG/10 ML FOR IR/CATH LAB
INTRA_ARTERIAL | Status: AC
  Filled 2017-10-18: qty 10

## 2017-10-18 MED ORDER — SODIUM CHLORIDE 0.9 % WEIGHT BASED INFUSION: 1.0000 mL/kg/h | INTRAVENOUS | Status: DC

## 2017-10-18 MED ORDER — VERAPAMIL HCL 2.5 MG/ML IV SOLN
INTRA_ARTERIAL | Status: DC | PRN
  Administered 2017-10-18: 11:00:00 via INTRA_ARTERIAL

## 2017-10-18 MED ORDER — SODIUM CHLORIDE 0.9% FLUSH: 3.0000 mL | Freq: Two times a day (BID) | INTRAVENOUS | Status: DC

## 2017-10-18 MED ORDER — FENTANYL CITRATE (PF) 100 MCG/2ML IJ SOLN
INTRAMUSCULAR | Status: DC | PRN
  Administered 2017-10-18: 25 ug via INTRAVENOUS

## 2017-10-18 MED ORDER — SODIUM CHLORIDE 0.9 % IV SOLN: INTRAVENOUS | Status: DC

## 2017-10-18 MED ORDER — ASPIRIN 81 MG PO CHEW: 81.0000 mg | CHEWABLE_TABLET | ORAL | Status: DC

## 2017-10-18 MED ORDER — HEPARIN SODIUM (PORCINE) 1000 UNIT/ML IJ SOLN
INTRAMUSCULAR | Status: AC
  Filled 2017-10-18: qty 1

## 2017-10-18 MED ORDER — VERAPAMIL HCL 2.5 MG/ML IV SOLN
INTRAVENOUS | Status: AC
  Filled 2017-10-18: qty 2

## 2017-10-18 MED ORDER — LIDOCAINE HCL 1 % IJ SOLN
INTRAMUSCULAR | Status: AC
  Filled 2017-10-18: qty 20

## 2017-10-18 MED ORDER — ACETAMINOPHEN 325 MG PO TABS: 650.0000 mg | ORAL_TABLET | ORAL | Status: DC | PRN

## 2017-10-18 MED ORDER — SODIUM CHLORIDE 0.9 % IV SOLN: 250.0000 mL | INTRAVENOUS | Status: DC | PRN

## 2017-10-18 MED ORDER — HEPARIN (PORCINE) IN NACL 2-0.9 UNIT/ML-% IJ SOLN
INTRAMUSCULAR | Status: AC
  Filled 2017-10-18: qty 1000

## 2017-10-18 MED ORDER — IOPAMIDOL (ISOVUE-370) INJECTION 76%
INTRAVENOUS | Status: DC | PRN
  Administered 2017-10-18: 100 mL via INTRA_ARTERIAL

## 2017-10-18 MED ORDER — MIDAZOLAM HCL 2 MG/2ML IJ SOLN
INTRAMUSCULAR | Status: DC | PRN
  Administered 2017-10-18 (×2): 1 mg via INTRAVENOUS

## 2017-10-18 SURGICAL SUPPLY — 27 items
ADDWIRE .014X145 (WIRE) ×2
BALLN SPRINTER MX OTW 1.5X12 (BALLOONS) ×2
BALLOON SPRINTER MX OTW 1.5X12 (BALLOONS) ×1 IMPLANT
BAND ZEPHYR COMPRESS 30 LONG (HEMOSTASIS) ×2 IMPLANT
BUR ROTAPRO CONNECT 1.25 (BURR) ×1 IMPLANT
BURR ROTAPRO CNCT ADVNCR 1.25 (BURR) ×1
BURR ROTAPRO CONNECT 1.25 (BURR) ×1
CATH INFINITI 5 FR AL2 (CATHETERS) ×2 IMPLANT
CATH LAUNCHER 6FR AL2 (CATHETERS) ×1 IMPLANT
CATH MICROGUIDE FINCRSS 150 CM (MICROCATHETER) ×1 IMPLANT
CATHETER LAUNCHER 6FR AL2 (CATHETERS) ×2
EXTENSION ADDWIRE .014X145 (WIRE) ×1 IMPLANT
GUIDEWIRE INQWIRE 1.5J.035X260 (WIRE) ×1 IMPLANT
INQWIRE 1.5J .035X260CM (WIRE) ×2
KIT ENCORE 26 ADVANTAGE (KITS) ×2 IMPLANT
KIT HEART LEFT (KITS) ×2 IMPLANT
KIT HEMO VALVE WATCHDOG (MISCELLANEOUS) ×2 IMPLANT
LUBRICANT ROTAGLIDE 20CC VIAL (MISCELLANEOUS) ×2 IMPLANT
MICROGUIDE FINECROSS 150 CM (MICROCATHETER) ×2
PACK CARDIAC CATHETERIZATION (CUSTOM PROCEDURE TRAY) ×2 IMPLANT
SHEATH RAIN RADIAL 21G 6FR (SHEATH) ×2 IMPLANT
TRANSDUCER W/STOPCOCK (MISCELLANEOUS) ×2 IMPLANT
TUBING CIL FLEX 10 FLL-RA (TUBING) ×2 IMPLANT
WIRE ASAHI PROWATER 300CM (WIRE) ×2 IMPLANT
WIRE EXTRA SUPPORT .009X325CM (WIRE) ×2 IMPLANT
WIRE HI TORQ WHISPER MS 190CM (WIRE) ×2 IMPLANT
WIRE MAILMAN 300CM (WIRE) ×2 IMPLANT

## 2017-10-18 NOTE — Progress Notes (Signed)
Your procedure required the use of prolonged amounts of x-ray. Radiation side-effects are unlikely but possible. Please have a family member inspect your chest and back area daily, for signs of redness or rash two weeks from today. Please call your Cardiologist and tell us whether if you have concerns about your findings.    Adrian Prows, MD 10/18/2017, 1:26 PM [facility]. [place] Pager: [phone] Office: [phone] If no answer: Cell:  [phone]

## 2017-10-18 NOTE — Discharge Instructions (Addendum)
NO METFORMIN/GLUCOPHAGE FOR 2 DAYS      Radial Site Care Refer to this sheet in the next few weeks. These instructions provide you with information about caring for yourself after your procedure. Your health care provider may also give you more specific instructions. Your treatment has been planned according to current medical practices, but problems sometimes occur. Call your health care provider if you have any problems or questions after your procedure. What can I expect after the procedure? After your procedure, it is typical to have the following:  Bruising at the radial site that usually fades within 1-2 weeks.  Blood collecting in the tissue (hematoma) that may be painful to the touch. It should usually decrease in size and tenderness within 1-2 weeks.  Follow these instructions at home:  Take medicines only as directed by your health care provider.  You may shower 24-48 hours after the procedure or as directed by your health care provider. Remove the bandage (dressing) and gently wash the site with plain soap and water. Pat the area dry with a clean towel. Do not rub the site, because this may cause bleeding.  Do not take baths, swim, or use a hot tub until your health care provider approves.  Check your insertion site every day for redness, swelling, or drainage.  Do not apply powder or lotion to the site.  Do not flex or bend the affected arm for 24 hours or as directed by your health care provider.  Do not push or pull heavy objects with the affected arm for 24 hours or as directed by your health care provider.  Do not lift over 10 lb (4.5 kg) for 5 days after your procedure or as directed by your health care provider.  Ask your health care provider when it is okay to: ? Return to work or school. ? Resume usual physical activities or sports. ? Resume sexual activity.  Do not drive home if you are discharged the same day as the procedure. Have someone else drive  you.  You may drive 24 hours after the procedure unless otherwise instructed by your health care provider.  Do not operate machinery or power tools for 24 hours after the procedure.  If your procedure was done as an outpatient procedure, which means that you went home the same day as your procedure, a responsible adult should be with you for the first 24 hours after you arrive home.  Keep all follow-up visits as directed by your health care provider. This is important. Contact a health care provider if:  You have a fever.  You have chills.  You have increased bleeding from the radial site. Hold pressure on the site. Get help right away if:  You have unusual pain at the radial site.  You have redness, warmth, or swelling at the radial site.  You have drainage (other than a small amount of blood on the dressing) from the radial site.  The radial site is bleeding, and the bleeding does not stop after 30 minutes of holding steady pressure on the site.  Your arm or hand becomes pale, cool, tingly, or numb. This information is not intended to replace advice given to you by your health care provider. Make sure you discuss any questions you have with your health care provider. Document Released: 08/14/2010 Document Revised: 12/18/2015 Document Reviewed: 01/28/2014 Elsevier Interactive Patient Education  [employer]. Your procedure required the use of prolonged amounts of x-ray. Radiation side-effects are unlikely but possible.  Please have a family member inspect your chest and back area daily, for signs of redness or rash two weeks from today. Please call your Cardiologist and tell us whether if you have concerns about your findings.

## 2017-10-18 NOTE — Interval H&P Note (Signed)
History and Physical Interval Note:  10/18/2017 10:31 AM  Jesse [name]  has presented today for surgery, with the diagnosis of cad  The various methods of treatment have been discussed with the patient and family. After consideration of risks, benefits and other options for treatment, the patient has consented to  Procedure(s): CORONARY STENT INTERVENTION (N/A) as a surgical intervention .  The patient's history has been reviewed, patient examined, no change in status, stable for surgery.  I have reviewed the patient's chart and labs.  Questions were answered to the patient's satisfaction.   Planned PCI   Adrian Prows

## 2017-10-19 ENCOUNTER — Encounter (HOSPITAL_COMMUNITY): Payer: Self-pay | Admitting: Cardiology

## 2017-10-19 MED FILL — Nitroglycerin IV Soln 100 MCG/ML in D5W: INTRA_ARTERIAL | Qty: 10 | Status: AC

## 2017-10-19 MED FILL — Verapamil HCl IV Soln 2.5 MG/ML: INTRAVENOUS | Qty: 2 | Status: AC

## 2017-10-19 MED FILL — Lidocaine HCl Local Inj 1%: INTRAMUSCULAR | Qty: 20 | Status: AC

## 2017-10-19 MED FILL — Heparin Sodium (Porcine) 2 Unit/ML in Sodium Chloride 0.9%: INTRAMUSCULAR | Qty: 1000 | Status: AC

## 2017-10-27 DIAGNOSIS — R0602 Shortness of breath: Secondary | ICD-10-CM | POA: Diagnosis not present

## 2017-10-27 DIAGNOSIS — I25119 Atherosclerotic heart disease of native coronary artery with unspecified angina pectoris: Secondary | ICD-10-CM | POA: Diagnosis not present

## 2017-10-27 DIAGNOSIS — I10 Essential (primary) hypertension: Secondary | ICD-10-CM | POA: Diagnosis not present

## 2017-10-27 NOTE — Progress Notes (Signed)
Patient had prolonged procedure and radiation exposure on 10/16/17. I saw him today in the office and he has no skin burns or any other complications. They are aware of the exposure. He will need repeat procedure. Christen Butter

## 2017-10-28 DIAGNOSIS — Z45018 Encounter for adjustment and management of other part of cardiac pacemaker: Secondary | ICD-10-CM | POA: Diagnosis not present

## 2017-10-28 DIAGNOSIS — Z950 Presence of cardiac pacemaker: Secondary | ICD-10-CM | POA: Diagnosis not present

## 2017-11-10 ENCOUNTER — Telehealth (HOSPITAL_COMMUNITY): Payer: Self-pay

## 2017-11-10 NOTE — Telephone Encounter (Signed)
Called Dr.Ganji's office - Patient was recathed on 10/18/17 with a f/u on 10/27/17. Patient will be going in for another cath on 11/29/17 with a f/u with Dr.Ganji on 5/17. Will place patient on Medical hold and will continue to follow until f/u.

## 2017-11-11 ENCOUNTER — Encounter (HOSPITAL_COMMUNITY): Payer: Self-pay | Admitting: Cardiology

## 2017-11-11 NOTE — Progress Notes (Signed)
Patient will be discharged home on Xarelto 2.5 mg p.o. b.i.d. that he has been taking due to severe diffuse coronary artery disease.  We'll hold off on cardiac rehabilitation in view of planned repeat PCI.

## 2017-11-16 DIAGNOSIS — E118 Type 2 diabetes mellitus with unspecified complications: Secondary | ICD-10-CM | POA: Diagnosis not present

## 2017-11-16 DIAGNOSIS — Z125 Encounter for screening for malignant neoplasm of prostate: Secondary | ICD-10-CM | POA: Diagnosis not present

## 2017-11-16 DIAGNOSIS — E782 Mixed hyperlipidemia: Secondary | ICD-10-CM | POA: Diagnosis not present

## 2017-11-23 DIAGNOSIS — N400 Enlarged prostate without lower urinary tract symptoms: Secondary | ICD-10-CM | POA: Diagnosis not present

## 2017-11-23 DIAGNOSIS — Z1810 Retained metal fragments, unspecified: Secondary | ICD-10-CM | POA: Diagnosis not present

## 2017-11-23 DIAGNOSIS — E118 Type 2 diabetes mellitus with unspecified complications: Secondary | ICD-10-CM | POA: Diagnosis not present

## 2017-11-23 DIAGNOSIS — I351 Nonrheumatic aortic (valve) insufficiency: Secondary | ICD-10-CM | POA: Diagnosis not present

## 2017-11-23 DIAGNOSIS — E782 Mixed hyperlipidemia: Secondary | ICD-10-CM | POA: Diagnosis not present

## 2017-11-23 DIAGNOSIS — D485 Neoplasm of uncertain behavior of skin: Secondary | ICD-10-CM | POA: Diagnosis not present

## 2017-11-23 DIAGNOSIS — Z0001 Encounter for general adult medical examination with abnormal findings: Secondary | ICD-10-CM | POA: Diagnosis not present

## 2017-11-23 DIAGNOSIS — I517 Cardiomegaly: Secondary | ICD-10-CM | POA: Diagnosis not present

## 2017-11-23 DIAGNOSIS — I340 Nonrheumatic mitral (valve) insufficiency: Secondary | ICD-10-CM | POA: Diagnosis not present

## 2017-11-23 DIAGNOSIS — B0229 Other postherpetic nervous system involvement: Secondary | ICD-10-CM | POA: Diagnosis not present

## 2017-11-23 DIAGNOSIS — Z1212 Encounter for screening for malignant neoplasm of rectum: Secondary | ICD-10-CM | POA: Diagnosis not present

## 2017-11-23 DIAGNOSIS — Z6826 Body mass index (BMI) 26.0-26.9, adult: Secondary | ICD-10-CM | POA: Diagnosis not present

## 2017-11-23 DIAGNOSIS — Z7982 Long term (current) use of aspirin: Secondary | ICD-10-CM | POA: Diagnosis not present

## 2017-11-24 DIAGNOSIS — H04123 Dry eye syndrome of bilateral lacrimal glands: Secondary | ICD-10-CM | POA: Diagnosis not present

## 2017-11-24 DIAGNOSIS — I25119 Atherosclerotic heart disease of native coronary artery with unspecified angina pectoris: Secondary | ICD-10-CM | POA: Diagnosis not present

## 2017-11-24 DIAGNOSIS — H2513 Age-related nuclear cataract, bilateral: Secondary | ICD-10-CM | POA: Diagnosis not present

## 2017-11-24 DIAGNOSIS — H401132 Primary open-angle glaucoma, bilateral, moderate stage: Secondary | ICD-10-CM | POA: Diagnosis not present

## 2017-11-28 NOTE — H&P (Signed)
Jesse [name] 10/27/2017 9:00 AM Location: [facility] Patient #: [id number] DOB: 05-08-1941 Single / Language: Jesse [name] / Race: White Male   History of Present Illness Jesse Page MD; 10/27/2017 10:01 AM) Patient words: last OV 10/10/17; 7-10 day fu for cath/pci  **no compaints**.  The patient is a 77 year old male who presents for a Follow-up for Dyspnea.  Additional reasons for visit:  Follow-up for Coronary artery disease is described as the following: [name] is a Caucasian male with coronary artery disease and CABG in 2007 in [place] with LIMA to LAD, SVG to OM which is occluded by angiography on 02/04/2015, occluded SVG to RCA. He underwent complex Cx CTO PCI and balloon angioplasty on 08/30/2017. 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 He underwent repeat attempt at angioplasty to the circumflex coronary artery on 10/16/17, it was unsuccessful.  He now presents for f/u and has not had any recurrence of angina and dyspnea has remained stable, although states he has not pushed himself much. No other complaints.   Problem List/Past Medical Jesse [name]; 10/27/2017 8:46 AM) Controlled type 2 diabetes mellitus without complication, without long-term current use of insulin (E11.9)  History of coronary artery bypass graft x 3 (Z95.1)  CAD S/P CABG 2007: 3 and history of PTCA post CABG (h/o stenting to SVG to RCA x 4 and x2 to native vessel, D1 and RI 40% ISR). Coronary angiogram 02/04/2015. SVG to OM occluded. Ost Cx to Mid Cx lesion, 90% stenosed. SVG to RCA occluded S/P Thrombectomy and balloon PTCA. Patient may need intervention to a very large circumflex coronary artery which has a high-grade proximal stenosis. Patent LIMA to LAD. Mixed hyperlipidemia due to type 2 diabetes mellitus (E11.69)  H/O non-ST elevation  myocardial infarction (NSTEMI) (I25.2)  Benign essential hypertension (I10)  Echocardiogram 02/04/2015: Mild decrease in LV systolic function, ejection fraction 45%. Inferior and posterior severe hypokinesis to akinesis. Mild to moderate tricuspid regurgitation, mild pulmonary hypertension. GERD without esophagitis (K21.9)  Shortness of breath on exertion (R06.02)  Echocardiogram 08/24/2017: Left ventricle cavity is normal in size. Moderate concentric hypertrophy of the left ventricle. Visual EF is 45-50%. Abnormal septal wall motion due to post-operative coronary artery bypass graft. Doppler evidence of grade I (impaired) diastolic dysfunction, normal LAP. Mild (Grade I) aortic regurgitation. Mild (Grade I) mitral regurgitation. Inadequate tricuspid regurgitation jet to estimate pulmonary artery pressure. Normal right atrial pressure. Compared to prior study dated 75/44/9201, LV systolic function is mildly reduced. Labwork  10/13/2017: Glucose 121, creatinine 1.04, EGFR 69/80, potassium 4.5, BMP otherwise normal. CBC normal. INR 1.1, prothrombin time 11.3. Cholesterol 125, triglycerides 173, HDL 29, LDL 61. 08/24/2017: Glucose 218, creatinine 1.28, EGFR 54/62, potassium 4.8, BMP otherwise normal. CBC normal. INR 1.1, prothrombin time 11.2. Labs 07/29/2016: Serum glucose 120 mg, BUN 24, creatinine 1.18, eGFR 59 mL, potassium 5.0. HB 10.8/HCT 40.3, platelets 161. Pro time normal. 02/01/2017: Cholesterol 127, triglycerides 127, HDL 33, LDL 69. TSH 1.89. CBC normal. Glucose 140, creatinine 1.19, potassium 4.4, CMP normal. 04/09/2016: Creatinine 1.1, Potassium 4.7, CMP normal. Cholesterol 136, HDL 33, LDL 63, Triglycerides 201. HgbA1c 6.6. H/H normal, macrocytic indicies, CBC normal. 09/21/2015: Potassium 4.9, Creatinine 1.00, CMP normal. CHolesterol 126, HDL 31, LDL 67, Triglycerides 140. TSH 2.130. CBC normal. 02/05/2015: Total cholesterol 108, triglycerides 226, HDL 21, LDL 42, serum glucose 169, creatinine 1.04,  BMP otherwise normal, PLT 131, CBC  otherwise normal Sick sinus syndrome (I49.5)  Encounter for care of pacemaker (Z45.018)  Atherosclerosis of native coronary artery of native heart with angina pectoris (I25.119) [2007]: CAD S/P CABG 2007: 3 and history of PTCA post CABG (h/o stenting to SVG to RCA x 4 and x2 to native vessel, D1 and RI 40% ISR). Coronary angiogram 10/18/2017: Unsuccessful attempt at PCI/rotational atherectomy. Cx patent with 80% stenosis. 08/30/17: Successful CTO PTCA with balloon angioplasty only. 08/04/2017: SVG to RCA occluded. Severe diffuse disease native RCA and small and occluded in mid segment. SVG to OM occluded. Ost Cx to Mid Cx lesion, 90% stenosed followed by short CTO. Patent LIMA to LAD. Moderate diffuse coronary calcification. Lexiscan myoview stress test 02/14/2017 1. The resting electrocardiogram demonstrated normal sinus rhythm and incomplete RBBB. Inferior and posterior infarct, old. Stress EKG is non-diagnostic for ischemia as it a pharmacologic stress using Lexiscan. Stress symptoms included dyspnea. Patient unable to complete stress test due to fatigue and switched to [place]. 2. Myocardial perfusion imaging is abnormal. There is a moderate area of infarction in the basal inferior, mid inferior and apical inferior myocardial wall(s). Gated SPECT imaging demonstrates akinesis of the basal inferior, mid inferior and apical inferior myocardial wall(s). The left ventricular ejection fraction was calculated or visually estimated to be 38%. This is an intermediate risk study, clinical correlation recommended. Cardiac pacemaker in situ (Z95.0) [2011]: sick sinus syndrome Medtronic pacemaker implantation in 2011 for sick sinus syndrome.  Allergies Jesse [name]; 11-07-17 8:46 AM) Jesse [name] *HEMATOLOGICAL AGENTS - MISC.*  Difficulty breathing.  Family History Jesse [name]; 11-07-2017 8:46 AM) Mother  Deceased. at age 97, from Jesse [name]. Known MI in her 90's. Father   Deceased. at age 65, from Stroke. No prior Heart Issues Known Brother 1  Deceased  Social History Jesse [name]; 2017-11-07 8:46 AM) Current tobacco use  Former smoker. Quit at age 27 Non Drinker/No Alcohol Use  Marital status  Widowed. Lives with Partner Living Situation  Lives with Partner Number of Children  0.  Past Surgical History Jesse [name]; 07-Nov-2017 8:46 AM) Coronary Artery Bypass, Three  CAD S/P CABG 2007: 3 and history of PTCA post CABG (h/o stenting to SVG to RCA x 4 and x2 to native vessel, D1 and RI 40% ISR). Coronary angiogram 02/04/2015. SVG to OM occluded. SVG to RCA occluded S/P Thrombectomy and balloon PTCA. Patent LIMA to LAD. Surgery to repair injury to right hand [1961]: Cardiac Pacemaker Insertion [10/2009]: Medtronic Pacemaker Implanted in [place]  Medication History Jesse [name]; 11-07-2017 8:51 AM) Losartan Potassium (25MG Tablet, 1 (one) Tablet Oral daily, Taken starting 10/10/2017) Active. Xarelto (2.5MG Tablet, 1 Tablet Tablet Oral two times daily, Taken starting 09/08/2017) Active. Nitrostat (0.4MG Tab Sublingual, place 1 tab sublingual Tab Sub Sublingual every 5 minutes as needed for chest pain., Taken starting 07/27/2017) Active. Omeprazole (40MG Capsule DR, 1 (one) Capsule Capsule Capsul Oral as needed, Taken starting 07/30/2016) Active. Simvastatin (40MG Tablet, 1 (one) Tablet Tablet Tablet T Oral every evening after dinner, Taken starting 05/14/2015) Active. Metoprolol Tartrate (25MG Tablet, 1 Tablet Tablet Tablet Tablet Oral two times daily, Taken starting 05/14/2015) Active. Aspirin (81MG Tablet Chewable, 1 Oral daily) Active. Vitamin B12 (1 Oral daily) Specific strength unknown - Active. MetFORMIN HCl (500MG Tablet, 1 Oral daily) Active. Glimepiride (2MG Tablet, 1 Oral two times daily) Active. Biotin (1000MCG Tablet, 1 Oral daily) Active. Fish Oil (1 Oral daily) Specific strength unknown - Active. Brilinta (90MG  Tablet, 1 Oral two times daily) Active. Dorzolamide HCl-Timolol  Mal (22.3-6.8MG/ML Solution, Ophthalmic daily) Active. Latanoprost (0.005% Solution, Ophthalmic daily) Active. Zostrix HP (0.1% Cream, External prn) Active. Medications Reconciled (meds present)  Diagnostic Studies History Jesse [name]; 2017-11-26 8:46 AM) Coronary Angiogram  Coronary angiogram 10/18/2017: Unsuccessful attempt at PCI/rotational atherectomy. Cx patent with 80% stenosis. 08/30/17: Successful CTO PTCA with balloon angioplasty only. 08/04/2017: SVG to RCA occluded. Severe diffuse disease native RCA and small and occluded in mid segment. SVG to OM occluded. Ost Cx to Mid Cx lesion, 90% stenosed followed by short CTO. Patent LIMA to LAD. Moderate diffuse coronary calcification.    Review of Systems Jesse Page MD; 11-26-2017 10:01 AM) General Present- Feeling well. Not Present- Fatigue, Fever and Night Sweats. Skin Not Present- Itching and Rash. HEENT Not Present- Headache. Neck Not Present- Neck Pain. Respiratory Not Present- Wakes up from Sleep Wheezing or Short of Breath and Wheezing. Cardiovascular Present- Difficulty Breathing On Exertion (improved). Not Present- Chest Pain, Claudications, Fainting, Orthopnea and Swelling of Extremities. Gastrointestinal Not Present- Abdominal Pain, Constipation, Diarrhea, Nausea and Vomiting. 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 Not Present- Joint Swelling. Neurological Not Present- Headaches. Hematology Not Present- Blood Clots, Easy Bruising and Nose Bleed. All other systems negative  Vitals Jesse [name]; 11/26/17 8:53 AM) 11-26-17 8:49 AM Weight: 205.25 lb Height: 74in Body Surface Area: 2.2 m Body Mass Index: 26.35 kg/m  Pulse: 70 (Regular)  P.OX: 99% (Room air) BP: 134/63 (Sitting, Right Arm, Standard)       Physical Exam Jesse Page, MD; Nov 26, 2017 10:3 AM) General Mental  Status-Alert. General Appearance-Cooperative, Appears stated age, Not in acute distress. Orientation-Oriented X3. Build & Nutrition-Well built and Well nourished.  Head and Neck Thyroid Gland Characteristics - no palpable nodules, no palpable enlargement.  Chest and Lung Exam Chest and lung exam reveals -quiet, even and easy respiratory effort with no use of accessory muscles. Palpation Tender - No chest wall tenderness.  Cardiovascular Cardiovascular examination reveals -normal heart sounds, regular rate and rhythm with no murmurs and carotid auscultation reveals no bruits. Inspection Jugular vein - Right - No Distention.  Abdomen Palpation/Percussion Normal exam - Non Tender and No hepatosplenomegaly. Auscultation Normal exam - Bowel sounds normal.  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 Palpation - Edema - Bilateral - No edema. Femoral pulse - Bilateral - Normal. Popliteal pulse - Bilateral - Normal. Dorsalis pedis pulse - Bilateral - Normal. Posterior tibial pulse - Bilateral - Feeble. Carotid arteries - Bilateral-No Carotid bruit.  Neurologic Motor-Grossly intact without any focal deficits.  Musculoskeletal Global Assessment Left Lower Extremity - normal range of motion without pain. Right Lower Extremity - normal range of motion without pain.    Assessment & Plan Jesse Page MD; 11-26-17 10:03 AM) Atherosclerosis of native coronary artery of native heart with angina pectoris (I25.119) Story: CAD S/P CABG 2007: 3 and history of PTCA post CABG (h/o stenting to SVG to RCA x 4 and x2 to native vessel, D1 and RI 40% ISR).  Coronary angiogram 10/18/2017: Unsuccessful attempt at PCI/rotational atherectomy. Cx patent with 80% stenosis. 08/30/17: Successful CTO PTCA with balloon angioplasty only. 08/04/2017: SVG to RCA occluded. Severe diffuse disease native RCA and small and  occluded in mid segment. SVG to OM occluded. Ost Cx to Mid Cx lesion, 90% stenosed followed by short CTO. Patent LIMA to LAD. Moderate diffuse coronary calcification.  Lexiscan myoview stress test 02/14/2017 1. The resting electrocardiogram demonstrated normal sinus rhythm and incomplete RBBB.  Inferior and posterior infarct, old. Stress EKG is non-diagnostic for ischemia as it a pharmacologic stress using Lexiscan. Stress symptoms included dyspnea. Patient unable to complete stress test due to fatigue and switched to [place]. 2. Myocardial perfusion imaging is abnormal. There is a moderate area of infarction in the basal inferior, mid inferior and apical inferior myocardial wall(s). Gated SPECT imaging demonstrates akinesis of the basal inferior, mid inferior and apical inferior myocardial wall(s). The left ventricular ejection fraction was calculated or visually estimated to be 38%. This is an intermediate risk study, clinical correlation recommended. Impression: EKG 10/27/2017: A paced rhythm at the rate of 69 bpm, left axis deviation, left anterior fascicular block. Right bundle branch block. Cannot exclude inferior infarct old, posterior infarct old. No evidence of ischemia. No significant change from EKG 09/08/2017, EKG 07/27/2016  EKG 02/11/2017: AV paced rhythm at rate of 60 bpm. No further analysis due to paced rhythm. Current Plans Complete electrocardiogram (93000) Future Plans 7/59/1638: METABOLIC PANEL, BASIC (46659) - one time 11/21/2017: CBC & PLATELETS (AUTO) (93570) - one time 11/21/2017: PT (PROTHROMBIN TIME) (17793) - one time Shortness of breath on exertion (R06.02) Story: Echocardiogram 08/24/2017: Left ventricle cavity is normal in size. Moderate concentric hypertrophy of the left ventricle. Visual EF is 45-50%. Abnormal septal wall motion due to post-operative coronary artery bypass graft. Doppler evidence of grade I (impaired) diastolic dysfunction, normal LAP. Mild (Grade I)  aortic regurgitation. Mild (Grade I) mitral regurgitation. Inadequate tricuspid regurgitation jet to estimate pulmonary artery pressure. Normal right atrial pressure. Compared to prior study dated 90/30/0923, LV systolic function is mildly reduced. Benign essential hypertension (I10) Labwork Story: 10/13/2017: Glucose 121, creatinine 1.04, EGFR 69/80, potassium 4.5, BMP otherwise normal. CBC normal. INR 1.1, prothrombin time 11.3. Cholesterol 125, triglycerides 173, HDL 29, LDL 61.  08/24/2017: Glucose 218, creatinine 1.28, EGFR 54/62, potassium 4.8, BMP otherwise normal. CBC normal. INR 1.1, prothrombin time 11.2.  Labs 07/29/2016: Serum glucose 120 mg, BUN 24, creatinine 1.18, eGFR 59 mL, potassium 5.0. HB 10.8/HCT 40.3, platelets 161. Pro time normal.  02/01/2017: Cholesterol 127, triglycerides 127, HDL 33, LDL 69. TSH 1.89. CBC normal. Glucose 140, creatinine 1.19, potassium 4.4, CMP normal.  04/09/2016: Creatinine 1.1, Potassium 4.7, CMP normal. Cholesterol 136, HDL 33, LDL 63, Triglycerides 201. HgbA1c 6.6. H/H normal, macrocytic indicies, CBC normal.  09/21/2015: Potassium 4.9, Creatinine 1.00, CMP normal. CHolesterol 126, HDL 31, LDL 67, Triglycerides 140. TSH 2.130. CBC normal.  02/05/2015: Total cholesterol 108, triglycerides 226, HDL 21, LDL 42, serum glucose 169, creatinine 1.04, BMP otherwise normal, PLT 131, CBC otherwise normal  Note:- Recommendations:  Mr. Orlen Leedy is a Caucasian male with coronary artery disease and CABG in 2007 in [place] with LIMA to LAD, SVG to OM which is occluded by angiography on 02/04/2015, occluded SVG to RCA. He underwent complex Cx CTO PCI and balloon angioplasty on 08/30/2017. 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  He underwent repeat attempt at angioplasty to the circumflex coronary artery on 10/16/17,  it was unsuccessful. However the CTS consultation was obtained, Dr. Gilford Raid did not feel that surgery is an option as it would be difficult to reach the circumflex coronary artery and he recommended that I reattempted angioplasty to the ramus and also circumflex coronary artery. I have had a lengthy discussion with the patient and his wife and they're willing to proceed.  Radiation exposure and radiation injury has  been explained to the patient and his wife. He has not had any skin Burns since his procedure. I'll see him back after angiography child performed after 3-4 weeks of recuperation. I will attempt CSI atherectomy. He has not pushed himself in doing activity due to dyspnea and angina.    Signed by Jesse Page, MD (10/27/2017 10:04 AM)

## 2017-11-29 ENCOUNTER — Ambulatory Visit (HOSPITAL_COMMUNITY)
Admission: RE | Admit: 2017-11-29 | Discharge: 2017-11-30 | Disposition: A | Discharge status: Home / Self Care | Payer: Medicare Other | Source: Ambulatory Visit | Attending: Cardiology | Admitting: Cardiology

## 2017-11-29 ENCOUNTER — Ambulatory Visit (HOSPITAL_COMMUNITY)
Admission: RE | Disposition: A | Discharge status: Home / Self Care | Payer: Self-pay | Source: Ambulatory Visit | Attending: Cardiology

## 2017-11-29 DIAGNOSIS — I25118 Atherosclerotic heart disease of native coronary artery with other forms of angina pectoris: Secondary | ICD-10-CM | POA: Diagnosis present

## 2017-11-29 DIAGNOSIS — I10 Essential (primary) hypertension: Secondary | ICD-10-CM | POA: Insufficient documentation

## 2017-11-29 DIAGNOSIS — Z955 Presence of coronary angioplasty implant and graft: Secondary | ICD-10-CM

## 2017-11-29 DIAGNOSIS — Z7982 Long term (current) use of aspirin: Secondary | ICD-10-CM | POA: Insufficient documentation

## 2017-11-29 DIAGNOSIS — I2510 Atherosclerotic heart disease of native coronary artery without angina pectoris: Secondary | ICD-10-CM | POA: Diagnosis not present

## 2017-11-29 DIAGNOSIS — Z87891 Personal history of nicotine dependence: Secondary | ICD-10-CM | POA: Insufficient documentation

## 2017-11-29 DIAGNOSIS — R0602 Shortness of breath: Secondary | ICD-10-CM | POA: Diagnosis not present

## 2017-11-29 DIAGNOSIS — E782 Mixed hyperlipidemia: Secondary | ICD-10-CM | POA: Insufficient documentation

## 2017-11-29 DIAGNOSIS — Z7901 Long term (current) use of anticoagulants: Secondary | ICD-10-CM | POA: Diagnosis not present

## 2017-11-29 DIAGNOSIS — I209 Angina pectoris, unspecified: Secondary | ICD-10-CM | POA: Diagnosis present

## 2017-11-29 DIAGNOSIS — Z9861 Coronary angioplasty status: Secondary | ICD-10-CM

## 2017-11-29 DIAGNOSIS — Z950 Presence of cardiac pacemaker: Secondary | ICD-10-CM | POA: Insufficient documentation

## 2017-11-29 DIAGNOSIS — Z7902 Long term (current) use of antithrombotics/antiplatelets: Secondary | ICD-10-CM | POA: Diagnosis not present

## 2017-11-29 DIAGNOSIS — Z7984 Long term (current) use of oral hypoglycemic drugs: Secondary | ICD-10-CM | POA: Diagnosis not present

## 2017-11-29 DIAGNOSIS — K219 Gastro-esophageal reflux disease without esophagitis: Secondary | ICD-10-CM | POA: Diagnosis not present

## 2017-11-29 DIAGNOSIS — E119 Type 2 diabetes mellitus without complications: Secondary | ICD-10-CM | POA: Diagnosis not present

## 2017-11-29 DIAGNOSIS — I252 Old myocardial infarction: Secondary | ICD-10-CM | POA: Diagnosis not present

## 2017-11-29 DIAGNOSIS — I25718 Atherosclerosis of autologous vein coronary artery bypass graft(s) with other forms of angina pectoris: Secondary | ICD-10-CM | POA: Diagnosis not present

## 2017-11-29 HISTORY — PX: CORONARY ATHERECTOMY: CATH118238

## 2017-11-29 HISTORY — PX: CORONARY STENT INTERVENTION: CATH118234

## 2017-11-29 LAB — GLUCOSE, CAPILLARY
GLUCOSE-CAPILLARY: 163 mg/dL — AB (ref 65–99)
Glucose-Capillary: 140 mg/dL — ABNORMAL HIGH (ref 65–99)
Glucose-Capillary: 92 mg/dL (ref 65–99)

## 2017-11-29 LAB — POCT ACTIVATED CLOTTING TIME
Activated Clotting Time: 318 s
Activated Clotting Time: 224 s
Activated Clotting Time: 268 s

## 2017-11-29 SURGERY — CORONARY ATHERECTOMY
Anesthesia: LOCAL

## 2017-11-29 MED ORDER — HEPARIN (PORCINE) IN NACL 1000-0.9 UT/500ML-% IV SOLN
INTRAVENOUS | Status: AC
  Filled 2017-11-29: qty 1000

## 2017-11-29 MED ORDER — INSULIN ASPART 100 UNIT/ML ~~LOC~~ SOLN: 0.0000 [IU] | Freq: Three times a day (TID) | SUBCUTANEOUS | Status: DC

## 2017-11-29 MED ORDER — HYDRALAZINE HCL 20 MG/ML IJ SOLN: 5.0000 mg | INTRAMUSCULAR | Status: AC | PRN

## 2017-11-29 MED ORDER — MIDAZOLAM HCL 2 MG/2ML IJ SOLN
INTRAMUSCULAR | Status: AC
  Filled 2017-11-29: qty 2

## 2017-11-29 MED ORDER — LABETALOL HCL 5 MG/ML IV SOLN: 10.0000 mg | INTRAVENOUS | Status: AC | PRN

## 2017-11-29 MED ORDER — HEPARIN SODIUM (PORCINE) 1000 UNIT/ML IJ SOLN
INTRAMUSCULAR | Status: AC
  Filled 2017-11-29: qty 1

## 2017-11-29 MED ORDER — SODIUM CHLORIDE 0.9 % IV SOLN: INTRAVENOUS | Status: AC

## 2017-11-29 MED ORDER — NITROGLYCERIN 0.4 MG SL SUBL: 0.4000 mg | SUBLINGUAL_TABLET | SUBLINGUAL | Status: DC | PRN

## 2017-11-29 MED ORDER — HEPARIN (PORCINE) IN NACL 2-0.9 UNITS/ML
INTRAMUSCULAR | Status: AC | PRN
  Administered 2017-11-29 (×2): 500 mL

## 2017-11-29 MED ORDER — ONDANSETRON HCL 4 MG/2ML IJ SOLN
4.0000 mg | Freq: Four times a day (QID) | INTRAMUSCULAR | Status: DC | PRN
Start: 2017-11-29 — End: 2017-11-30

## 2017-11-29 MED ORDER — SODIUM CHLORIDE 0.9% FLUSH: 3.0000 mL | Freq: Two times a day (BID) | INTRAVENOUS | Status: DC

## 2017-11-29 MED ORDER — HYDROMORPHONE HCL 1 MG/ML IJ SOLN
INTRAMUSCULAR | Status: AC
  Filled 2017-11-29: qty 0.5

## 2017-11-29 MED ORDER — SIMVASTATIN 40 MG PO TABS
40.0000 mg | ORAL_TABLET | Freq: Every day | ORAL | Status: DC
  Administered 2017-11-29 – 2017-11-30 (×2): 40 mg via ORAL
  Filled 2017-11-29: qty 1
  Filled 2017-11-29: qty 2
  Filled 2017-11-29: qty 1

## 2017-11-29 MED ORDER — LATANOPROST 0.005 % OP SOLN
1.0000 [drp] | Freq: Every day | OPHTHALMIC | Status: DC
  Administered 2017-11-29: 1 [drp] via OPHTHALMIC
  Filled 2017-11-29: qty 2.5

## 2017-11-29 MED ORDER — ACETAMINOPHEN 325 MG PO TABS: 650.0000 mg | ORAL_TABLET | ORAL | Status: DC | PRN

## 2017-11-29 MED ORDER — LIDOCAINE-EPINEPHRINE 1 %-1:100000 IJ SOLN
INTRAMUSCULAR | Status: DC | PRN
  Administered 2017-11-29: 5 mL

## 2017-11-29 MED ORDER — GLIMEPIRIDE 2 MG PO TABS
2.0000 mg | ORAL_TABLET | Freq: Two times a day (BID) | ORAL | Status: DC
  Administered 2017-11-30: 07:00:00 2 mg via ORAL
  Filled 2017-11-29 (×2): qty 1

## 2017-11-29 MED ORDER — DORZOLAMIDE HCL-TIMOLOL MAL 2-0.5 % OP SOLN
1.0000 [drp] | Freq: Two times a day (BID) | OPHTHALMIC | Status: DC
  Administered 2017-11-29 – 2017-11-30 (×2): 1 [drp] via OPHTHALMIC
  Filled 2017-11-29: qty 10

## 2017-11-29 MED ORDER — METOPROLOL TARTRATE 12.5 MG HALF TABLET
12.5000 mg | ORAL_TABLET | Freq: Three times a day (TID) | ORAL | Status: DC
  Administered 2017-11-29 – 2017-11-30 (×2): 12.5 mg via ORAL
  Filled 2017-11-29 (×2): qty 1

## 2017-11-29 MED ORDER — ASPIRIN 81 MG PO CHEW: 81.0000 mg | CHEWABLE_TABLET | ORAL | Status: DC

## 2017-11-29 MED ORDER — SODIUM CHLORIDE 0.9% FLUSH
3.0000 mL | Freq: Two times a day (BID) | INTRAVENOUS | Status: DC
  Administered 2017-11-29: 3 mL via INTRAVENOUS

## 2017-11-29 MED ORDER — VIPERSLIDE LUBRICANT OPTIME
TOPICAL | Status: DC | PRN
  Administered 2017-11-29: 16:00:00 via SURGICAL_CAVITY

## 2017-11-29 MED ORDER — SODIUM CHLORIDE 0.9 % IV SOLN
INTRAVENOUS | Status: DC
  Administered 2017-11-29 (×3): 250 mL via INTRAVENOUS
  Administered 2017-11-29: 13:00:00 via INTRAVENOUS

## 2017-11-29 MED ORDER — FENTANYL CITRATE (PF) 100 MCG/2ML IJ SOLN
INTRAMUSCULAR | Status: AC
  Filled 2017-11-29: qty 2

## 2017-11-29 MED ORDER — IOHEXOL 350 MG/ML SOLN
INTRAVENOUS | Status: DC | PRN
  Administered 2017-11-29: 155 mL via INTRA_ARTERIAL

## 2017-11-29 MED ORDER — ANGIOPLASTY BOOK
Freq: Once | Status: AC
  Administered 2017-11-29: 21:00:00 1
  Filled 2017-11-29: qty 1

## 2017-11-29 MED ORDER — ASPIRIN 81 MG PO CHEW
81.0000 mg | CHEWABLE_TABLET | Freq: Every day | ORAL | Status: DC
  Administered 2017-11-30: 11:00:00 81 mg via ORAL
  Filled 2017-11-29: qty 1

## 2017-11-29 MED ORDER — NITROGLYCERIN 1 MG/10 ML FOR IR/CATH LAB
INTRA_ARTERIAL | Status: DC | PRN
  Administered 2017-11-29 (×2): 100 ug via INTRACORONARY

## 2017-11-29 MED ORDER — VITAMIN B-12 1000 MCG PO TABS
1000.0000 ug | ORAL_TABLET | Freq: Every day | ORAL | Status: DC
  Administered 2017-11-30: 11:00:00 1000 ug via ORAL
  Filled 2017-11-29: qty 1

## 2017-11-29 MED ORDER — SODIUM CHLORIDE 0.9% FLUSH: 3.0000 mL | INTRAVENOUS | Status: DC | PRN

## 2017-11-29 MED ORDER — LIDOCAINE HCL (PF) 1 % IJ SOLN
INTRAMUSCULAR | Status: DC | PRN
  Administered 2017-11-29: 15 mL via SUBCUTANEOUS

## 2017-11-29 MED ORDER — SODIUM CHLORIDE 0.9 % IV SOLN: 250.0000 mL | INTRAVENOUS | Status: DC | PRN

## 2017-11-29 MED ORDER — NITROGLYCERIN 1 MG/10 ML FOR IR/CATH LAB
INTRA_ARTERIAL | Status: AC
  Filled 2017-11-29: qty 10

## 2017-11-29 MED ORDER — LIDOCAINE-EPINEPHRINE 1 %-1:100000 IJ SOLN
INTRAMUSCULAR | Status: AC
  Filled 2017-11-29: qty 1

## 2017-11-29 MED ORDER — BIOTIN 5000 MCG PO TABS: 5000.0000 ug | ORAL_TABLET | Freq: Every day | ORAL | Status: DC

## 2017-11-29 MED ORDER — LOSARTAN POTASSIUM 25 MG PO TABS
25.0000 mg | ORAL_TABLET | Freq: Every day | ORAL | Status: DC
  Administered 2017-11-30: 25 mg via ORAL
  Filled 2017-11-29: qty 1

## 2017-11-29 MED ORDER — TICAGRELOR 90 MG PO TABS
90.0000 mg | ORAL_TABLET | Freq: Two times a day (BID) | ORAL | Status: DC
  Administered 2017-11-29 – 2017-11-30 (×2): 90 mg via ORAL
  Filled 2017-11-29 (×2): qty 1

## 2017-11-29 MED ORDER — LIDOCAINE HCL (PF) 1 % IJ SOLN
INTRAMUSCULAR | Status: AC
  Filled 2017-11-29: qty 30

## 2017-11-29 MED ORDER — HYDROMORPHONE HCL 1 MG/ML IJ SOLN
INTRAMUSCULAR | Status: DC | PRN
  Administered 2017-11-29: 0.5 mg via INTRAVENOUS

## 2017-11-29 MED ORDER — MIDAZOLAM HCL 2 MG/2ML IJ SOLN
INTRAMUSCULAR | Status: DC | PRN
  Administered 2017-11-29: 2 mg via INTRAVENOUS

## 2017-11-29 MED ORDER — HEPARIN SODIUM (PORCINE) 1000 UNIT/ML IJ SOLN
INTRAMUSCULAR | Status: DC | PRN
  Administered 2017-11-29: 7000 [IU] via INTRAVENOUS
  Administered 2017-11-29: 3000 [IU] via INTRAVENOUS

## 2017-11-29 MED ORDER — IOPAMIDOL (ISOVUE-370) INJECTION 76%: INTRAVENOUS | Status: DC | PRN

## 2017-11-29 MED ORDER — ZOLPIDEM TARTRATE 5 MG PO TABS: 5.0000 mg | ORAL_TABLET | Freq: Every evening | ORAL | Status: DC | PRN

## 2017-11-29 SURGICAL SUPPLY — 26 items
ADDWIRE .014X145 (WIRE) ×2
BALLN SAPPHIRE 2.0X15 (BALLOONS) ×2
BALLN SAPPHIRE 2.5X12 (BALLOONS) ×2
BALLOON SAPPHIRE 2.0X15 (BALLOONS) ×1 IMPLANT
BALLOON SAPPHIRE 2.5X12 (BALLOONS) ×1 IMPLANT
CATH MACH 1 7FR CLS3.5 (CATHETERS) ×2 IMPLANT
CATH TELEPORT (CATHETERS) ×2 IMPLANT
CROWN DIAMONDBACK CLASSIC 1.25 (BURR) ×2 IMPLANT
DEVICE CLOSURE MYNXGRIP 6/7F (Vascular Products) ×2 IMPLANT
ELECT DEFIB PAD ADLT CADENCE (PAD) ×2 IMPLANT
EXTENSION ADDWIRE .014X145 (WIRE) ×1 IMPLANT
GUIDELINER 6F (CATHETERS) ×2 IMPLANT
KIT ENCORE 26 ADVANTAGE (KITS) ×2 IMPLANT
KIT HEART LEFT (KITS) ×2 IMPLANT
KIT HEMO VALVE WATCHDOG (MISCELLANEOUS) ×2 IMPLANT
KIT MICROPUNCTURE NIT STIFF (SHEATH) ×2 IMPLANT
LUBRICANT VIPERSLIDE CORONARY (MISCELLANEOUS) ×2 IMPLANT
PACK CARDIAC CATHETERIZATION (CUSTOM PROCEDURE TRAY) ×2 IMPLANT
SHEATH AVANTI 11CM 7FR (SHEATH) ×2 IMPLANT
STENT RESOLUTE ONYX 2.5X15 (Permanent Stent) ×2 IMPLANT
TRANSDUCER W/STOPCOCK (MISCELLANEOUS) ×2 IMPLANT
TUBING CIL FLEX 10 FLL-RA (TUBING) ×2 IMPLANT
WIRE EMERALD 3MM-J .035X150CM (WIRE) ×2 IMPLANT
WIRE HI TORQ WHISPER MS 190CM (WIRE) ×2 IMPLANT
WIRE MAILMAN 182CM (WIRE) ×2 IMPLANT
WIRE VIPER ADVANCE COR .012TIP (WIRE) ×4 IMPLANT

## 2017-11-29 NOTE — Interval H&P Note (Signed)
History and Physical Interval Note:  11/29/2017 3:29 PM  Jesse [name]  has presented today for surgery, with the diagnosis of CAD  The various methods of treatment have been discussed with the patient and family. After consideration of risks, benefits and other options for treatment, the patient has consented to  Procedure(s): CORONARY ATHERECTOMY (N/A) as a surgical intervention .  The patient's history has been reviewed, patient examined, no change in status, stable for surgery.  I have reviewed the patient's chart and labs.  Questions were answered to the patient's satisfaction.   Cath Lab Visit (complete for each Cath Lab visit)  Clinical Evaluation Leading to the Procedure:  See previous documentation  ACS: No.  Non-ACS:    Anginal Classification: CCS III  Anti-ischemic medical therapy: Maximal Therapy (2 or more classes of medications)  Non-Invasive Test Results: No non-invasive testing performed  Prior CABG: Previous CABG        Adrian Prows

## 2017-11-30 ENCOUNTER — Other Ambulatory Visit: Payer: Self-pay

## 2017-11-30 ENCOUNTER — Encounter (HOSPITAL_COMMUNITY): Payer: Self-pay | Admitting: Cardiology

## 2017-11-30 DIAGNOSIS — I252 Old myocardial infarction: Secondary | ICD-10-CM | POA: Diagnosis not present

## 2017-11-30 DIAGNOSIS — Z7901 Long term (current) use of anticoagulants: Secondary | ICD-10-CM | POA: Diagnosis not present

## 2017-11-30 DIAGNOSIS — I25718 Atherosclerosis of autologous vein coronary artery bypass graft(s) with other forms of angina pectoris: Secondary | ICD-10-CM | POA: Diagnosis not present

## 2017-11-30 DIAGNOSIS — Z7902 Long term (current) use of antithrombotics/antiplatelets: Secondary | ICD-10-CM | POA: Diagnosis not present

## 2017-11-30 DIAGNOSIS — E119 Type 2 diabetes mellitus without complications: Secondary | ICD-10-CM | POA: Diagnosis not present

## 2017-11-30 DIAGNOSIS — E782 Mixed hyperlipidemia: Secondary | ICD-10-CM | POA: Diagnosis not present

## 2017-11-30 DIAGNOSIS — Z950 Presence of cardiac pacemaker: Secondary | ICD-10-CM | POA: Diagnosis not present

## 2017-11-30 DIAGNOSIS — K219 Gastro-esophageal reflux disease without esophagitis: Secondary | ICD-10-CM | POA: Diagnosis not present

## 2017-11-30 DIAGNOSIS — Z7982 Long term (current) use of aspirin: Secondary | ICD-10-CM | POA: Diagnosis not present

## 2017-11-30 DIAGNOSIS — Z87891 Personal history of nicotine dependence: Secondary | ICD-10-CM | POA: Diagnosis not present

## 2017-11-30 DIAGNOSIS — Z7984 Long term (current) use of oral hypoglycemic drugs: Secondary | ICD-10-CM | POA: Diagnosis not present

## 2017-11-30 DIAGNOSIS — I10 Essential (primary) hypertension: Secondary | ICD-10-CM | POA: Diagnosis not present

## 2017-11-30 LAB — BASIC METABOLIC PANEL
Anion gap: 5 (ref 5–15)
BUN: 14 mg/dL (ref 6–20)
CHLORIDE: 110 mmol/L (ref 101–111)
CO2: 21 mmol/L — ABNORMAL LOW (ref 22–32)
Calcium: 8.1 mg/dL — ABNORMAL LOW (ref 8.9–10.3)
Creatinine, Ser: 1 mg/dL (ref 0.61–1.24)
Glucose, Bld: 182 mg/dL — ABNORMAL HIGH (ref 65–99)
Potassium: 4 mmol/L (ref 3.5–5.1)
SODIUM: 136 mmol/L (ref 135–145)

## 2017-11-30 LAB — CBC
HCT: 34.9 % — ABNORMAL LOW (ref 39.0–52.0)
Hemoglobin: 12.1 g/dL — ABNORMAL LOW (ref 13.0–17.0)
MCH: 33.1 pg (ref 26.0–34.0)
MCHC: 34.7 g/dL (ref 30.0–36.0)
MCV: 95.4 fL (ref 78.0–100.0)
Platelets: 123 10*3/uL — ABNORMAL LOW (ref 150–400)
RBC: 3.66 MIL/uL — AB (ref 4.22–5.81)
RDW: 13.1 % (ref 11.5–15.5)
WBC: 4.9 10*3/uL (ref 4.0–10.5)

## 2017-11-30 LAB — GLUCOSE, CAPILLARY: GLUCOSE-CAPILLARY: 131 mg/dL — AB (ref 65–99)

## 2017-11-30 MED FILL — Heparin Sod (Porcine)-NaCl IV Soln 1000 Unit/500ML-0.9%: INTRAVENOUS | Qty: 1000 | Status: AC

## 2017-11-30 MED FILL — Fentanyl Citrate Preservative Free (PF) Inj 100 MCG/2ML: INTRAMUSCULAR | Qty: 2 | Status: AC

## 2017-11-30 NOTE — Discharge Summary (Signed)
Physician Discharge Summary  Patient ID: Jesse [name] MRN: [id number] DOB/AGE: 78-01-1941 78 y.o.  Admit date: 11/29/2017 Discharge date: 11/30/2017  Admission Diagnoses:  Discharge Diagnoses:  Active Problems:   Angina pectoris (HCC)   Post PTCA   Discharged Condition: good  Hospital Course:   77 year old Caucasian male with hypertension, hyperlipidemia, type 2 DM, coronary artery disease status post CABG in 2007 LIMA to LAD with patent LIMA-LAD and occluded SVG-OM & SVG-RCA, underwent successful CSI atherectomy and PCI to LCx. He is doing well post procedure. Cath site looks good.   Consults: None  Significant Diagnostic Studies:  Results for NASIIR, MONTS (MRN [id number]) as of 11/30/2017 13:27  Ref. Range 11/30/2017 01:75  BASIC METABOLIC PANEL Unknown Rpt (A)  Sodium Latest Ref Range: 135 - 145 mmol/L 136  Potassium Latest Ref Range: 3.5 - 5.1 mmol/L 4.0  Chloride Latest Ref Range: 101 - 111 mmol/L 110  CO2 Latest Ref Range: 22 - 32 mmol/L 21 (L)  Glucose Latest Ref Range: 65 - 99 mg/dL 182 (H)  BUN Latest Ref Range: 6 - 20 mg/dL 14  Creatinine Latest Ref Range: 0.61 - 1.24 mg/dL 1.00  Calcium Latest Ref Range: 8.9 - 10.3 mg/dL 8.1 (L)  Anion gap Latest Ref Range: 5 - 15  5  GFR, Est Non African American Latest Ref Range: >60 mL/min >60  GFR, Est African American Latest Ref Range: >60 mL/min >60    Treatments: Coronary angioplasty: 11/29/2017: Successful  CSI atherectomy of the ostial and proximal circumflex coronary artery followed by stenting with 2.5 x 15 mm resolute Onyx DES.  Stenosis reduced from 99% to 0% with maintenance of TIMI-3 to TIMI-3 flow.  Recommendation: Patient will be continued on aspirin and Brilinta, I will discontinue Xarelto 2.5 mg that was started previously.  Discharge home in the morning.  155 mL contrast utilized.  Discharge Exam: Blood pressure 110/63, pulse 63, temperature 98.1 F (36.7 C), temperature source Oral, resp. rate 20, height 6\' 2"   (1.88 m), weight 91 kg (200 lb 9.9 oz), SpO2 98 %. Physical Exam  Constitutional: He is oriented to person, place, and time. He appears well-developed and well-nourished.  HENT:  Head: Normocephalic and atraumatic.  Eyes: Pupils are equal, round, and reactive to light. Conjunctivae are normal.  Neck: Normal range of motion. Neck supple. No JVD present.  Cardiovascular: Normal rate, regular rhythm and normal heart sounds.  No murmur heard. Rt groin cath site with no bleeding/hematoma  Pulmonary/Chest: Effort normal and breath sounds normal.  Abdominal: Soft.  Musculoskeletal: He exhibits no edema.  Lymphadenopathy:    He has no cervical adenopathy.  Neurological: He is alert and oriented to person, place, and time. No cranial nerve deficit.  Skin: Skin is warm and dry.  Psychiatric: He has a normal mood and affect.    Disposition: Discharge disposition: 01-Home or Self Care       Discharge Instructions    Amb Referral to Cardiac Rehabilitation   Complete by:  As directed    Diagnosis:   PTCA Coronary Stents     Diet - low sodium heart healthy   Complete by:  As directed    Increase activity slowly   Complete by:  As directed      Allergies as of 11/30/2017   No Known Allergies     Medication List    TAKE these medications   aspirin 81 MG chewable tablet Chew 1 tablet (81 mg total) by mouth daily.   Biotin 5000 MCG  Tabs Take 5,000 mcg by mouth daily.   dorzolamide-timolol 22.3-6.8 MG/ML ophthalmic solution Commonly known as:  COSOPT Place 1 drop into both eyes 2 (two) times daily.   FISH OIL PO Take 1 capsule by mouth daily.   glimepiride 2 MG tablet Commonly known as:  AMARYL Take 2 mg by mouth 2 (two) times daily.   latanoprost 0.005 % ophthalmic solution Commonly known as:  XALATAN Place 1 drop into both eyes at bedtime.   losartan 25 MG tablet Commonly known as:  COZAAR Take 25 mg by mouth daily.   metFORMIN 500 MG 24 hr tablet Commonly known  as:  GLUCOPHAGE-XR Take 1 tablet (500 mg total) by mouth daily. Notes to patient:  HOLD  FOR  48 HRS AFTER PROCEDURE   metoprolol tartrate 25 MG tablet Commonly known as:  LOPRESSOR Take 0.5 tablets (12.5 mg total) by mouth 3 (three) times daily. What changed:    how much to take  when to take this   nitroGLYCERIN 0.4 MG SL tablet Commonly known as:  NITROSTAT Place 0.4 mg under the tongue every 5 (five) minutes as needed for chest pain.   simvastatin 40 MG tablet Commonly known as:  ZOCOR Take 40 mg by mouth daily.   ticagrelor 90 MG Tabs tablet Commonly known as:  BRILINTA Take 1 tablet (90 mg total) by mouth 2 (two) times daily.   vitamin B-12 1000 MCG tablet Commonly known as:  CYANOCOBALAMIN Take 1,000 mcg by mouth daily.   XARELTO 2.5 MG Tabs tablet Generic drug:  rivaroxaban Take 2.5 mg by mouth 2 (two) times daily.        SignedNigel Mormon 11/30/2017, 1:26 PM   Dezmen Alcock Esther Hardy, MD [facility] Pager: [phone] Office: [phone] If no answer Cell [phone]

## 2017-11-30 NOTE — Progress Notes (Addendum)
CARDIAC REHAB PHASE I   PRE:  Rate/Rhythm: 33 SR  BP:  Sitting: 114/53      MODE:  Ambulation: 550 ft   POST:  Rate/Rhythm: 84 A Paced  BP:  Sitting: 168/57       0920-100  Pt ambulated 550 ft with steady gait.  No complaints during walk.  Education completed including stent information, NTG use, Brilinta, diet, and exercise.  Handouts given to patient.  Pt with good recall of heart healthy items and NTG use.  All questions answered and pt verbalized understanding.   Noel Christmas, RN 11/30/2017 9:56 AM

## 2017-11-30 NOTE — Care Management Note (Signed)
Case Management Note  Patient Details  Name: Jesse [name] MRN: [id number] Date of Birth: 1941/03/27  Subjective/Objective:    From home, s/p stent intervention, will be on brilinta, which he is already on.                  Action/Plan: DC home when ready.  Expected Discharge Date:  11/30/17               Expected Discharge Plan:  Home/Self Care  In-House Referral:     Discharge planning Services  CM Consult, Medication Assistance  Post Acute Care Choice:    Choice offered to:     DME Arranged:    DME Agency:     HH Arranged:    HH Agency:     Status of Service:  Completed, signed off  If discussed at [employer] of Stay Meetings, dates discussed:    Additional Comments:  Zenon Mayo, RN 11/30/2017, 9:46 AM

## 2017-12-02 ENCOUNTER — Telehealth (HOSPITAL_COMMUNITY): Payer: Self-pay

## 2017-12-02 NOTE — Telephone Encounter (Signed)
Received new referral for patient to participate in the Cardiac Rehab Program. Called patient to see if he is interested in the Cardiac Rehab Program - Patient stated he is not interested as he is doing his own exercise. Closed referral.

## 2017-12-09 DIAGNOSIS — I10 Essential (primary) hypertension: Secondary | ICD-10-CM | POA: Diagnosis not present

## 2017-12-09 DIAGNOSIS — R0602 Shortness of breath: Secondary | ICD-10-CM | POA: Diagnosis not present

## 2017-12-09 DIAGNOSIS — I2510 Atherosclerotic heart disease of native coronary artery without angina pectoris: Secondary | ICD-10-CM | POA: Diagnosis not present

## 2017-12-13 DIAGNOSIS — X32XXXA Exposure to sunlight, initial encounter: Secondary | ICD-10-CM | POA: Diagnosis not present

## 2017-12-13 DIAGNOSIS — L570 Actinic keratosis: Secondary | ICD-10-CM | POA: Diagnosis not present

## 2017-12-13 DIAGNOSIS — L821 Other seborrheic keratosis: Secondary | ICD-10-CM | POA: Diagnosis not present

## 2017-12-28 ENCOUNTER — Telehealth (HOSPITAL_COMMUNITY): Payer: Self-pay

## 2017-12-28 NOTE — Telephone Encounter (Signed)
Received new referral. Patient is now interested in the Cardiac Rehab Program. Scheduled orientation on 02/14/2018 at 7:30am. Patient will attend the 8:15am exc class. Mailed packet.

## 2018-01-24 DIAGNOSIS — R109 Unspecified abdominal pain: Secondary | ICD-10-CM | POA: Diagnosis not present

## 2018-02-06 ENCOUNTER — Telehealth (HOSPITAL_COMMUNITY): Payer: Self-pay

## 2018-02-06 NOTE — Telephone Encounter (Signed)
Cardiac Rehab Medication Review by a Pharmacist  Does the patient  feel that his/her medications are working for him/her?   Yes  Has the patient been experiencing any side effects to the medications prescribed?  Pt reports meds are working but gets SOB with Brilinta. No other SE reported  Does the patient measure his/her own blood pressure or blood glucose at home?  yes AM BG 110-120, BP 110/65  Does the patient have any problems obtaining medications due to transportation or finances?   no  Understanding of regimen: good Understanding of indications: good Potential of compliance: good    Pharmacist comments: Pt states Xarelto was discontinued after sent placement. I confirmed this information with the discharge summary on 11/29/17 admission. Overall pt reports he is doing well with is medications and has already notified MD about SOB with Brilinta. Pt understands medication regimen well.    Jesse [name] 02/06/2018 1:01 PM

## 2018-02-14 ENCOUNTER — Encounter (HOSPITAL_COMMUNITY)
Admission: RE | Admit: 2018-02-14 | Discharge: 2018-02-14 | Disposition: A | Discharge status: Home / Self Care | Payer: Medicare Other | Source: Ambulatory Visit | Attending: Cardiology | Admitting: Cardiology

## 2018-02-14 ENCOUNTER — Encounter (HOSPITAL_COMMUNITY): Payer: Self-pay

## 2018-02-14 VITALS — Ht 73.0 in | Wt 196.9 lb

## 2018-02-14 DIAGNOSIS — Z7984 Long term (current) use of oral hypoglycemic drugs: Secondary | ICD-10-CM | POA: Diagnosis not present

## 2018-02-14 DIAGNOSIS — E7800 Pure hypercholesterolemia, unspecified: Secondary | ICD-10-CM | POA: Diagnosis not present

## 2018-02-14 DIAGNOSIS — I2510 Atherosclerotic heart disease of native coronary artery without angina pectoris: Secondary | ICD-10-CM | POA: Diagnosis not present

## 2018-02-14 DIAGNOSIS — Z7901 Long term (current) use of anticoagulants: Secondary | ICD-10-CM | POA: Insufficient documentation

## 2018-02-14 DIAGNOSIS — Z950 Presence of cardiac pacemaker: Secondary | ICD-10-CM | POA: Insufficient documentation

## 2018-02-14 DIAGNOSIS — Z7982 Long term (current) use of aspirin: Secondary | ICD-10-CM | POA: Diagnosis not present

## 2018-02-14 DIAGNOSIS — I10 Essential (primary) hypertension: Secondary | ICD-10-CM | POA: Diagnosis not present

## 2018-02-14 DIAGNOSIS — Z955 Presence of coronary angioplasty implant and graft: Secondary | ICD-10-CM | POA: Insufficient documentation

## 2018-02-14 DIAGNOSIS — E119 Type 2 diabetes mellitus without complications: Secondary | ICD-10-CM | POA: Diagnosis not present

## 2018-02-14 DIAGNOSIS — I252 Old myocardial infarction: Secondary | ICD-10-CM | POA: Diagnosis not present

## 2018-02-14 DIAGNOSIS — Z79899 Other long term (current) drug therapy: Secondary | ICD-10-CM | POA: Insufficient documentation

## 2018-02-14 NOTE — Progress Notes (Signed)
Clent Damore 77 y.o. male DOB: April 18, 1941 MRN: [id number]      Nutrition Note  1. 11/29/17 Stented coronary artery    Past Medical History:  Diagnosis Date  . Coronary artery disease   . High cholesterol   . Hypertension   . Myocardial infarction [facility]) 2017/2018?  Marland Kitchen Presence of permanent cardiac pacemaker   . Type II diabetes mellitus ([place])    Meds reviewed.  Vit B 12, biotin, glimepride, metformin, lopressor noted  HT: Ht Readings from Last 1 Encounters:  11/29/17 6\' 2"  (1.88 m)    WT: Wt Readings from Last 5 Encounters:  11/30/17 200 lb 9.9 oz (91 kg)  10/18/17 200 lb (90.7 kg)  08/30/17 200 lb (90.7 kg)  08/05/17 200 lb (90.7 kg)  02/03/15 200 lb (90.7 kg)     There is no height or weight on file to calculate BMI.   Current tobacco use? no Labs:  Lipid Panel     Component Value Date/Time   CHOL 108 02/05/2015 1119   TRIG 226 (H) 02/05/2015 1119   HDL 21 (L) 02/05/2015 1119   CHOLHDL 5.1 02/05/2015 1119   VLDL 45 (H) 02/05/2015 1119   LDLCALC 42 02/05/2015 1119    No results found for: HGBA1C CBG (last 3)  No results for input(s): GLUCAP in the last 72 hours.  Nutrition Note Spoke with pt. Nutrition plan and goals reviewed with pt. Pt is following Step 1 of the Therapeutic Lifestyle Changes diet. Pt wants to lose maintain weight current weight. Pt is diabetic. Don't have recent A1c for patient. This Probation officer went over Diabetes Education test results. Pt checks CBG's 3x times a day. Fasting CBG's reportedly 120-135 mg/dL. Per discussion, pt does not use canned/convenience foods often. Pt rarely adds salt to food. Pt eats out 1x week and usually gets fried fish. Pt shared he usually eats a mix of higher fat cuts of meat and lean proteins, has ice cream once a week, has 2 small pieces of german dark chocolate after dinner. Pt expressed understanding of the information reviewed. Pt aware of nutrition education classes offered and will attend nutrition classes as  available.  Nutrition Diagnosis ? Food-and nutrition-related knowledge deficit related to lack of exposure to information as related to diagnosis of: ? CVD ? DM  Overweight  related to excessive energy intake as evidenced by a 25.76  Nutrition Intervention ? Pt's individual nutrition plan and goals reviewed with pt.  Nutrition Goal(s):   ? Pt to identify and limit food sources of saturated fat, trans fat, and sodium  Plan:  ? Pt to attend nutrition classes ? Nutrition I ? Nutrition II ? Portion Distortion  ? Diabetes Blitz ? Diabetes Q & Ae determined ? Will provide client-centered nutrition education as part of interdisciplinary care ? Monitor and evaluate progress toward nutrition goal with team.   Laurina Bustle, MS, RD, LDN 02/14/2018 10:41 AM

## 2018-02-14 NOTE — Progress Notes (Signed)
Cardiac Individual Treatment Plan  Patient Details  Name: Jesse [name] MRN: [id number] Date of Birth: [birth year] Referring Provider:   Flowsheet Row CARDIAC REHAB PHASE II ORIENTATION from 02/14/2018 in [place]  Referring Provider  Adrian Prows MD      Initial Encounter Date:  [place] PHASE II ORIENTATION from 02/14/2018 in [place]  Date  02/14/18      Visit Diagnosis: 11/29/17 Stented coronary artery  Patient's Home Medications on Admission:  Current Outpatient Medications:  .  aspirin 81 MG chewable tablet, Chew 1 tablet (81 mg total) by mouth daily., Disp: 30 tablet, Rfl: 0 .  Biotin 5000 MCG TABS, Take 5,000 mcg by mouth daily., Disp: , Rfl:  .  dorzolamide-timolol (COSOPT) 22.3-6.8 MG/ML ophthalmic solution, Place 1 drop into both eyes 2 (two) times daily., Disp: , Rfl: 10 .  glimepiride (AMARYL) 2 MG tablet, Take 2 mg by mouth 2 (two) times daily., Disp: , Rfl:  .  latanoprost (XALATAN) 0.005 % ophthalmic solution, Place 1 drop into both eyes at bedtime., Disp: , Rfl: 12 .  losartan (COZAAR) 25 MG tablet, Take 25 mg by mouth daily., Disp: , Rfl:  .  metFORMIN (GLUCOPHAGE-XR) 500 MG 24 hr tablet, Take 1 tablet (500 mg total) by mouth daily., Disp: , Rfl: 1 .  metoprolol tartrate (LOPRESSOR) 25 MG tablet, Take 0.5 tablets (12.5 mg total) by mouth 3 (three) times daily. (Patient taking differently: Take 25 mg by mouth 2 (two) times daily. ), Disp: 90 tablet, Rfl: 0 .  nitroGLYCERIN (NITROSTAT) 0.4 MG SL tablet, Place 0.4 mg under the tongue every 5 (five) minutes as needed for chest pain. , Disp: , Rfl: 3 .  Omega-3 Fatty Acids (FISH OIL PO), Take 1 capsule by mouth daily., Disp: , Rfl:  .  rivaroxaban (XARELTO) 2.5 MG TABS tablet, Take 2.5 mg by mouth 2 (two) times daily., Disp: , Rfl:  .  simvastatin (ZOCOR) 40 MG tablet, Take 40 mg by mouth daily., Disp: , Rfl:  .  ticagrelor (BRILINTA) 90 MG  TABS tablet, Take 1 tablet (90 mg total) by mouth 2 (two) times daily., Disp: 60 tablet, Rfl: 3 .  vitamin B-12 (CYANOCOBALAMIN) 1000 MCG tablet, Take 1,000 mcg by mouth daily., Disp: , Rfl:   Past Medical History: Past Medical History:  Diagnosis Date  . Coronary artery disease   . High cholesterol   . Hypertension   . 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  [place] Presence of permanent cardiac pacemaker   . Type II diabetes mellitus (HCC)     Tobacco Use: Social History   Tobacco Use  Smoking Status Former Smoker  . Years: 3.00  . Types: Cigarettes  . Last attempt to quit: 1960  . Years since quitting: 59.5  Smokeless Tobacco Never Used    Labs: Recent [employer] for ITP Cardiac and Pulmonary Rehab Latest Ref Rng & Units 02/05/2015   Cholestrol 0 - 200 mg/dL 108   LDLCALC 0 - 99 mg/dL 42   HDL >40 mg/dL 21(L)   Trlycerides <150 mg/dL 226(H)      Capillary Blood Glucose: Lab Results  Component Value Date   GLUCAP 131 (H) 11/30/2017   GLUCAP 163 (H) 11/29/2017   GLUCAP 92 11/29/2017   GLUCAP 140 (H) 11/29/2017   GLUCAP 116 (H) 10/18/2017     Exercise Target Goals: Date: 02/14/18  Exercise Program Goal: Individual exercise prescription set  using results from initial 6 min walk test and THRR while considering  patient's activity barriers and safety.   Exercise Prescription Goal: Initial exercise prescription builds to 30-45 minutes a day of aerobic activity, 2-3 days per week.  Home exercise guidelines will be given to patient during program as part of exercise prescription that the participant will acknowledge.  Activity Barriers & Risk Stratification: Activity Barriers & Cardiac Risk Stratification - 02/14/18 0808    Activity Barriers & Cardiac Risk Stratification          Activity Barriers  Deconditioning;Muscular Weakness;Shortness of Breath    Cardiac Risk Stratification  High           6 Minute Walk: 6 Minute Walk    6 Minute Walk     Row Name 02/14/18 1020 02/14/18 1050   Phase  Initial  no documentation   Distance  1441 feet  no documentation   Walk Time  6 minutes  no documentation   # of Rest Breaks  0  no documentation   MPH  2.7  no documentation   METS  2.8  no documentation   RPE  11  no documentation   VO2 Peak  9.8  no documentation   Symptoms  No  no documentation   Resting HR  82 bpm  no documentation   Resting BP  104/64  no documentation   Resting Oxygen Saturation   98 %  no documentation   Exercise Oxygen Saturation  during 6 min walk  97 %  no documentation   Max Ex. HR  96 bpm  no documentation   Max Ex. BP  129/70  120/70   2 Minute Post BP  118/60  no documentation          Oxygen Initial Assessment:   Oxygen Re-Evaluation:   Oxygen Discharge (Final Oxygen Re-Evaluation):   Initial Exercise Prescription: Initial Exercise Prescription - 02/14/18 1000    Date of Initial Exercise RX and Referring Provider          Date  02/14/18    Referring Provider  Adrian Prows MD        Treadmill          MPH  2.2    Grade  1    Minutes  10    METs  2.99        Bike          Level  0.5    Minutes  10    METs  2.05        NuStep          Level  3    SPM  80    Minutes  10    METs  2        Prescription Details          Frequency (times per week)  3    Duration  Progress to 30 minutes of continuous aerobic without signs/symptoms of physical distress        Intensity          THRR 40-80% of Max Heartrate  57-114    Ratings of Perceived Exertion  11-13    Perceived Dyspnea  0-4        Progression          Progression  Continue to progress workloads to maintain intensity without signs/symptoms of physical distress.        Resistance Training  Training Prescription  Yes    Weight  2lbs    Reps  10-15           Perform Capillary Blood Glucose checks as needed.  Exercise Prescription Changes:   Exercise Comments:   Exercise Goals and  Review: Exercise Goals    Exercise Goals    Row Name 02/14/18 0808   Increase Physical Activity  Yes   Intervention  Provide advice, education, support and counseling about physical activity/exercise needs.;Develop an individualized exercise prescription for aerobic and resistive training based on initial evaluation findings, risk stratification, comorbidities and participant's personal goals.   Expected Outcomes  Short Term: Attend rehab on a regular basis to increase amount of physical activity.;Long Term: Add in home exercise to make exercise part of routine and to increase amount of physical activity.;Long Term: Exercising regularly at least 3-5 days a week.   Increase Strength and Stamina  Yes   Intervention  Provide advice, education, support and counseling about physical activity/exercise needs.;Develop an individualized exercise prescription for aerobic and resistive training based on initial evaluation findings, risk stratification, comorbidities and participant's personal goals.   Expected Outcomes  Short Term: Increase workloads from initial exercise prescription for resistance, speed, and METs.;Short Term: Perform resistance training exercises routinely during rehab and add in resistance training at home;Long Term: Improve cardiorespiratory fitness, muscular endurance and strength as measured by increased METs and functional capacity (6MWT)   Able to understand and use rate of perceived exertion (RPE) scale  Yes   Intervention  Provide education and explanation on how to use RPE scale   Expected Outcomes  Short Term: Able to use RPE daily in rehab to express subjective intensity level;Long Term:  Able to use RPE to guide intensity level when exercising independently   Knowledge and understanding of Target Heart Rate Range (THRR)  Yes   Intervention  Provide education and explanation of THRR including how the numbers were predicted and where they are located for reference   Expected  Outcomes  Short Term: Able to state/look up THRR;Long Term: Able to use THRR to govern intensity when exercising independently;Short Term: Able to use daily as guideline for intensity in rehab   Able to check pulse independently  Yes   Intervention  Provide education and demonstration on how to check pulse in carotid and radial arteries.;Review the importance of being able to check your own pulse for safety during independent exercise   Expected Outcomes  Short Term: Able to explain why pulse checking is important during independent exercise;Long Term: Able to check pulse independently and accurately   Understanding of Exercise Prescription  Yes   Intervention  Provide education, explanation, and written materials on patient's individual exercise prescription   Expected Outcomes  Short Term: Able to explain program exercise prescription;Long Term: Able to explain home exercise prescription to exercise independently          Exercise Goals Re-Evaluation :    Discharge Exercise Prescription (Final Exercise Prescription Changes):   Nutrition:  Target Goals: Understanding of nutrition guidelines, daily intake of sodium 1500mg , cholesterol 200mg , calories 30% from fat and 7% or less from saturated fats, daily to have 5 or more servings of fruits and vegetables.  Biometrics: Pre Biometrics - 02/14/18 1048    Pre Biometrics          Height  6\' 1"  (1.854 m)    Weight  196 lb 13.9 oz (89.3 kg)    Waist Circumference  39 inches  Hip Circumference  40 inches    Waist to Hip Ratio  0.98 %    BMI (Calculated)  25.98    Triceps Skinfold  26 mm    % Body Fat  28.6 %    Grip Strength  37 kg    Flexibility  11 in    Single Leg Stand  0 seconds            Nutrition Therapy Plan and Nutrition Goals: Nutrition Therapy & Goals - 02/14/18 1047    Nutrition Therapy          Diet  consistent carbohydrate heart healthy        Personal Nutrition Goals          Nutrition Goal  Pt to  identify and limit food sources of saturated fat, trans fat, and sodium        Intervention Plan          Intervention  Prescribe, educate and counsel regarding individualized specific dietary modifications aiming towards targeted core components such as weight, hypertension, lipid management, diabetes, heart failure and other comorbidities.    Expected Outcomes  Short Term Goal: Understand basic principles of dietary content, such as calories, fat, sodium, cholesterol and nutrients.           Nutrition Assessments: Nutrition Assessments - 02/14/18 1047    MEDFICTS Scores          Pre Score  46           Nutrition Goals Re-Evaluation:   Nutrition Goals Re-Evaluation:   Nutrition Goals Discharge (Final Nutrition Goals Re-Evaluation):   Psychosocial: Target Goals: Acknowledge presence or absence of significant depression and/or stress, maximize coping skills, provide positive support system. Participant is able to verbalize types and ability to use techniques and skills needed for reducing stress and depression.  Initial Review & Psychosocial Screening:   Quality of Life Scores: Quality of Life - 02/14/18 1053    Quality of Life          Select  Quality of Life        Quality of Life Scores          Health/Function Pre  28.4 %    Socioeconomic Pre  30 %    Psych/Spiritual Pre  30 %    Family Pre  30 %    GLOBAL Pre  29.25 %          Scores of 19 and below usually indicate a poorer quality of life in these areas.  A difference of  2-3 points is a clinically meaningful difference.  A difference of 2-3 points in the total score of the Quality of Life Index has been associated with significant improvement in overall quality of life, self-image, physical symptoms, and general health in studies assessing change in quality of life.  PHQ-9: Recent Review Flowsheet Data    There is no flowsheet data to display.     Interpretation of Total Score  Total Score  Depression Severity:  1-4 = Minimal depression, 5-9 = Mild depression, 10-14 = Moderate depression, 15-19 = Moderately severe depression, 20-27 = Severe depression   Psychosocial Evaluation and Intervention:   Psychosocial Re-Evaluation:   Psychosocial Discharge (Final Psychosocial Re-Evaluation):   Vocational Rehabilitation: Provide vocational rehab assistance to qualifying candidates.   Vocational Rehab Evaluation & Intervention:   Education: Education Goals: Education classes will be provided on a weekly basis, covering required topics. Participant will state understanding/return demonstration of topics presented.  Learning Barriers/Preferences: Learning Barriers/Preferences - 02/14/18 7628    Learning Barriers/Preferences          Learning Barriers  Sight    Learning Preferences  Pictoral;Verbal Instruction;Video;Skilled Demonstration;Audio;Written Material           Education Topics: Count Your Pulse:  -Group instruction provided by verbal instruction, demonstration, patient participation and written materials to support subject.  Instructors address importance of being able to find your pulse and how to count your pulse when at home without a heart monitor.  Patients get hands on experience counting their pulse with staff help and individually.   Heart Attack, Angina, and Risk Factor Modification:  -Group instruction provided by verbal instruction, video, and written materials to support subject.  Instructors address signs and symptoms of angina and heart attacks.    Also discuss risk factors for heart disease and how to make changes to improve heart health risk factors.   Functional Fitness:  -Group instruction provided by verbal instruction, demonstration, patient participation, and written materials to support subject.  Instructors address safety measures for doing things around the house.  Discuss how to get up and down off the floor, how to pick things up  properly, how to safely get out of a chair without assistance, and balance training.   Meditation and Mindfulness:  -Group instruction provided by verbal instruction, patient participation, and written materials to support subject.  Instructor addresses importance of mindfulness and meditation practice to help reduce stress and improve awareness.  Instructor also leads participants through a meditation exercise.    Stretching for Flexibility and Mobility:  -Group instruction provided by verbal instruction, patient participation, and written materials to support subject.  Instructors lead participants through series of stretches that are designed to increase flexibility thus improving mobility.  These stretches are additional exercise for major muscle groups that are typically performed during regular warm up and cool down.   Hands Only CPR:  -Group verbal, video, and participation provides a basic overview of AHA guidelines for community CPR. Role-play of emergencies allow participants the opportunity to practice calling for help and chest compression technique with discussion of AED use.   Hypertension: -Group verbal and written instruction that provides a basic overview of hypertension including the most recent diagnostic guidelines, risk factor reduction with self-care instructions and medication management.    Nutrition I class: Heart Healthy Eating:  -Group instruction provided by PowerPoint slides, verbal discussion, and written materials to support subject matter. The instructor gives an explanation and review of the Therapeutic Lifestyle Changes diet recommendations, which includes a discussion on lipid goals, dietary fat, sodium, fiber, plant stanol/sterol esters, sugar, and the components of a well-balanced, healthy diet.   Nutrition II class: Lifestyle Skills:  -Group instruction provided by PowerPoint slides, verbal discussion, and written materials to support subject matter. The  instructor gives an explanation and review of label reading, grocery shopping for heart health, heart healthy recipe modifications, and ways to make healthier choices when eating out.   Diabetes Question & Answer:  -Group instruction provided by PowerPoint slides, verbal discussion, and written materials to support subject matter. The instructor gives an explanation and review of diabetes co-morbidities, pre- and post-prandial blood glucose goals, pre-exercise blood glucose goals, signs, symptoms, and treatment of hypoglycemia and hyperglycemia, and foot care basics.   Diabetes Blitz:  -Group instruction provided by PowerPoint slides, verbal discussion, and written materials to support subject matter. The instructor gives an explanation and review of the physiology behind type 1  and type 2 diabetes, diabetes medications and rational behind using different medications, pre- and post-prandial blood glucose recommendations and Hemoglobin A1c goals, diabetes diet, and exercise including blood glucose guidelines for exercising safely.    Portion Distortion:  -Group instruction provided by PowerPoint slides, verbal discussion, written materials, and food models to support subject matter. The instructor gives an explanation of serving size versus portion size, changes in portions sizes over the last 20 years, and what consists of a serving from each food group.   Stress Management:  -Group instruction provided by verbal instruction, video, and written materials to support subject matter.  Instructors review role of stress in heart disease and how to cope with stress positively.     Exercising on Your Own:  -Group instruction provided by verbal instruction, power point, and written materials to support subject.  Instructors discuss benefits of exercise, components of exercise, frequency and intensity of exercise, and end points for exercise.  Also discuss use of nitroglycerin and activating EMS.  Review  options of places to exercise outside of rehab.  Review guidelines for sex with heart disease.   Cardiac Drugs I:  -Group instruction provided by verbal instruction and written materials to support subject.  Instructor reviews cardiac drug classes: antiplatelets, anticoagulants, beta blockers, and statins.  Instructor discusses reasons, side effects, and lifestyle considerations for each drug class.   Cardiac Drugs II:  -Group instruction provided by verbal instruction and written materials to support subject.  Instructor reviews cardiac drug classes: angiotensin converting enzyme inhibitors (ACE-I), angiotensin II receptor blockers (ARBs), nitrates, and calcium channel blockers.  Instructor discusses reasons, side effects, and lifestyle considerations for each drug class.   Anatomy and Physiology of the Circulatory System:  Group verbal and written instruction and models provide basic cardiac anatomy and physiology, with the coronary electrical and arterial systems. Review of: AMI, Angina, Valve disease, Heart Failure, Peripheral Artery Disease, Cardiac Arrhythmia, Pacemakers, and the ICD.   Other Education:  -Group or individual verbal, written, or video instructions that support the educational goals of the cardiac rehab program.   Holiday Eating Survival Tips:  -Group instruction provided by PowerPoint slides, verbal discussion, and written materials to support subject matter. The instructor gives patients tips, tricks, and techniques to help them not only survive but enjoy the holidays despite the onslaught of food that accompanies the holidays.   Knowledge Questionnaire Score: Knowledge Questionnaire Score - 02/14/18 1019    Knowledge Questionnaire Score          Pre Score  23/24           Core Components/Risk Factors/Patient Goals at Admission: Personal Goals and Risk Factors at Admission - 02/14/18 1049    Core Components/Risk Factors/Patient Goals on Admission           Diabetes  Yes    Intervention  Provide education about signs/symptoms and action to take for hypo/hyperglycemia.;Provide education about proper nutrition, including hydration, and aerobic/resistive exercise prescription along with prescribed medications to achieve blood glucose in normal ranges: Fasting glucose 65-99 mg/dL    Expected Outcomes  Short Term: Participant verbalizes understanding of the signs/symptoms and immediate care of hyper/hypoglycemia, proper foot care and importance of medication, aerobic/resistive exercise and nutrition plan for blood glucose control.;Long Term: Attainment of HbA1C < 7%.    Hypertension  Yes    Intervention  Provide education on lifestyle modifcations including regular physical activity/exercise, weight management, moderate sodium restriction and increased consumption of fresh fruit, vegetables, and low fat dairy,  alcohol moderation, and smoking cessation.;Monitor prescription use compliance.    Expected Outcomes  Short Term: Continued assessment and intervention until BP is < 140/59mm HG in hypertensive participants. < 130/60mm HG in hypertensive participants with diabetes, heart failure or chronic kidney disease.;Long Term: Maintenance of blood pressure at goal levels.    Lipids  Yes    Intervention  Provide education and support for participant on nutrition & aerobic/resistive exercise along with prescribed medications to achieve LDL 70mg , HDL >40mg .    Expected Outcomes  Short Term: Participant states understanding of desired cholesterol values and is compliant with medications prescribed. Participant is following exercise prescription and nutrition guidelines.;Long Term: Cholesterol controlled with medications as prescribed, with individualized exercise RX and with personalized nutrition plan. Value goals: LDL < 70mg , HDL > 40 mg.           Core Components/Risk Factors/Patient Goals Review:    Core Components/Risk Factors/Patient Goals at Discharge (Final  Review):    ITP Comments: ITP Comments    Row Name 02/14/18 0807   ITP Comments  Dr. Fransico Him, Medical Director      Comments: Patient attended orientation from [phone] to [phone]  to review rules and guidelines for program. Completed 6 minute walk test, Intitial ITP, and exercise prescription.  VSS. Telemetry-sinus rhythm, first degree AVB, BBB,   Asymptomatic.  Andi Hence, RN, BSN Cardiac Pulmonary Rehab 02/14/18 11:49 AM

## 2018-02-20 ENCOUNTER — Encounter (HOSPITAL_COMMUNITY): Payer: Medicare Other

## 2018-02-20 ENCOUNTER — Encounter (HOSPITAL_COMMUNITY)
Admission: RE | Admit: 2018-02-20 | Discharge: 2018-02-20 | Disposition: A | Discharge status: Home / Self Care | Payer: Medicare Other | Source: Ambulatory Visit | Attending: Cardiology | Admitting: Cardiology

## 2018-02-20 DIAGNOSIS — R109 Unspecified abdominal pain: Secondary | ICD-10-CM | POA: Diagnosis not present

## 2018-02-20 DIAGNOSIS — Z955 Presence of coronary angioplasty implant and graft: Secondary | ICD-10-CM | POA: Diagnosis not present

## 2018-02-20 DIAGNOSIS — E7800 Pure hypercholesterolemia, unspecified: Secondary | ICD-10-CM | POA: Diagnosis not present

## 2018-02-20 DIAGNOSIS — E119 Type 2 diabetes mellitus without complications: Secondary | ICD-10-CM | POA: Diagnosis not present

## 2018-02-20 DIAGNOSIS — I10 Essential (primary) hypertension: Secondary | ICD-10-CM | POA: Diagnosis not present

## 2018-02-20 DIAGNOSIS — Z950 Presence of cardiac pacemaker: Secondary | ICD-10-CM | POA: Diagnosis not present

## 2018-02-20 DIAGNOSIS — I252 Old myocardial infarction: Secondary | ICD-10-CM | POA: Diagnosis not present

## 2018-02-20 LAB — GLUCOSE, CAPILLARY
GLUCOSE-CAPILLARY: 106 mg/dL — AB (ref 70–99)
Glucose-Capillary: 143 mg/dL — ABNORMAL HIGH (ref 70–99)

## 2018-02-21 NOTE — Progress Notes (Signed)
Pulmonary Individual Treatment Plan  Patient Details  Name: Jesse [name] MRN: [id number] Date of Birth: 1941/06/09 Referring Provider:   Flowsheet Row CARDIAC REHAB PHASE II ORIENTATION from 02/14/2018 in [place]  Referring Provider  Adrian Prows MD      Initial Encounter Date:  [place] PHASE II ORIENTATION from 02/14/2018 in [place]  Date  02/14/18      Visit Diagnosis: 11/29/17 Stented coronary artery  Patient's Home Medications on Admission:   Current Outpatient Medications:  .  aspirin 81 MG chewable tablet, Chew 1 tablet (81 mg total) by mouth daily., Disp: 30 tablet, Rfl: 0 .  Biotin 5000 MCG TABS, Take 5,000 mcg by mouth daily., Disp: , Rfl:  .  dorzolamide-timolol (COSOPT) 22.3-6.8 MG/ML ophthalmic solution, Place 1 drop into both eyes 2 (two) times daily., Disp: , Rfl: 10 .  glimepiride (AMARYL) 2 MG tablet, Take 2 mg by mouth 2 (two) times daily., Disp: , Rfl:  .  latanoprost (XALATAN) 0.005 % ophthalmic solution, Place 1 drop into both eyes at bedtime., Disp: , Rfl: 12 .  losartan (COZAAR) 25 MG tablet, Take 25 mg by mouth daily., Disp: , Rfl:  .  metFORMIN (GLUCOPHAGE-XR) 500 MG 24 hr tablet, Take 1 tablet (500 mg total) by mouth daily., Disp: , Rfl: 1 .  metoprolol tartrate (LOPRESSOR) 25 MG tablet, Take 0.5 tablets (12.5 mg total) by mouth 3 (three) times daily. (Patient taking differently: Take 25 mg by mouth 2 (two) times daily. ), Disp: 90 tablet, Rfl: 0 .  nitroGLYCERIN (NITROSTAT) 0.4 MG SL tablet, Place 0.4 mg under the tongue every 5 (five) minutes as needed for chest pain. , Disp: , Rfl: 3 .  Omega-3 Fatty Acids (FISH OIL PO), Take 1 capsule by mouth daily., Disp: , Rfl:  .  rivaroxaban (XARELTO) 2.5 MG TABS tablet, Take 2.5 mg by mouth 2 (two) times daily., Disp: , Rfl:  .  simvastatin (ZOCOR) 40 MG tablet, Take 40 mg by mouth daily., Disp: , Rfl:  .  ticagrelor (BRILINTA) 90  MG TABS tablet, Take 1 tablet (90 mg total) by mouth 2 (two) times daily., Disp: 60 tablet, Rfl: 3 .  vitamin B-12 (CYANOCOBALAMIN) 1000 MCG tablet, Take 1,000 mcg by mouth daily., Disp: , Rfl:   Past Medical History: Past Medical History:  Diagnosis Date  . Coronary artery disease   . High cholesterol   . Hypertension   . 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  [place] Kitchen Presence of permanent cardiac pacemaker   . Type II diabetes mellitus (HCC)     Tobacco Use: Social History   Tobacco Use  Smoking Status Former Smoker  . Years: 3.00  . Types: Cigarettes  . Last attempt to quit: 1960  . Years since quitting: 59.6  Smokeless Tobacco Never Used    Labs: Recent Chemical engineer    Labs for ITP Cardiac and Pulmonary Rehab Latest Ref Rng & Units 02/05/2015   Cholestrol 0 - 200 mg/dL 108   LDLCALC 0 - 99 mg/dL 42   HDL >40 mg/dL 21(L)   Trlycerides <150 mg/dL 226(H)      Capillary Blood Glucose: Lab Results  Component Value Date   GLUCAP 106 (H) 02/20/2018   GLUCAP 143 (H) 02/20/2018   GLUCAP 131 (H) 11/30/2017   GLUCAP 163 (H) 11/29/2017   GLUCAP 92 11/29/2017     Pulmonary Assessment Scores:   Pulmonary Function Assessment:   Exercise Target  Goals:    Exercise Program Goal: Individual exercise prescription set using results from initial 6 min walk test and THRR while considering  patient's activity barriers and safety.   Exercise Prescription Goal: Initial exercise prescription builds to 30-45 minutes a day of aerobic activity, 2-3 days per week.  Home exercise guidelines will be given to patient during program as part of exercise prescription that the participant will acknowledge.  Activity Barriers & Risk Stratification: Activity Barriers & Cardiac Risk Stratification - 02/14/18 0808    Activity Barriers & Cardiac Risk Stratification          Activity Barriers  Deconditioning;Muscular Weakness;Shortness of Breath    Cardiac Risk Stratification  High            6 Minute Walk: 6 Minute Walk    6 Minute Walk    Row Name 02/14/18 1020 02/14/18 1050   Phase  Initial  no documentation   Distance  1441 feet  no documentation   Walk Time  6 minutes  no documentation   # of Rest Breaks  0  no documentation   MPH  2.7  no documentation   METS  2.8  no documentation   RPE  11  no documentation   VO2 Peak  9.8  no documentation   Symptoms  No  no documentation   Resting HR  82 bpm  no documentation   Resting BP  104/64  no documentation   Resting Oxygen Saturation   98 %  no documentation   Exercise Oxygen Saturation  during 6 min walk  97 %  no documentation   Max Ex. HR  96 bpm  no documentation   Max Ex. BP  129/70  120/70   2 Minute Post BP  118/60  no documentation          Oxygen Initial Assessment:   Oxygen Re-Evaluation:   Oxygen Discharge (Final Oxygen Re-Evaluation):   Initial Exercise Prescription: Initial Exercise Prescription - 02/14/18 1000    Date of Initial Exercise RX and Referring Provider          Date  02/14/18    Referring Provider  Adrian Prows MD        Treadmill          MPH  2.2    Grade  1    Minutes  10    METs  2.99        Bike          Level  0.5    Minutes  10    METs  2.05        NuStep          Level  3    SPM  80    Minutes  10    METs  2        Prescription Details          Frequency (times per week)  3    Duration  Progress to 30 minutes of continuous aerobic without signs/symptoms of physical distress        Intensity          THRR 40-80% of Max Heartrate  57-114    Ratings of Perceived Exertion  11-13    Perceived Dyspnea  0-4        Progression          Progression  Continue to progress workloads to maintain intensity without signs/symptoms of physical distress.  Resistance Training          Training Prescription  Yes    Weight  2lbs    Reps  10-15           Perform Capillary Blood Glucose checks as needed.  Exercise Prescription  Changes:   Exercise Comments:   Exercise Goals and Review: Exercise Goals    Exercise Goals    Row Name 02/14/18 0808   Increase Physical Activity  Yes   Intervention  Provide advice, education, support and counseling about physical activity/exercise needs.;Develop an individualized exercise prescription for aerobic and resistive training based on initial evaluation findings, risk stratification, comorbidities and participant's personal goals.   Expected Outcomes  Short Term: Attend rehab on a regular basis to increase amount of physical activity.;Long Term: Add in home exercise to make exercise part of routine and to increase amount of physical activity.;Long Term: Exercising regularly at least 3-5 days a week.   Increase Strength and Stamina  Yes   Intervention  Provide advice, education, support and counseling about physical activity/exercise needs.;Develop an individualized exercise prescription for aerobic and resistive training based on initial evaluation findings, risk stratification, comorbidities and participant's personal goals.   Expected Outcomes  Short Term: Increase workloads from initial exercise prescription for resistance, speed, and METs.;Short Term: Perform resistance training exercises routinely during rehab and add in resistance training at home;Long Term: Improve cardiorespiratory fitness, muscular endurance and strength as measured by increased METs and functional capacity (6MWT)   Able to understand and use rate of perceived exertion (RPE) scale  Yes   Intervention  Provide education and explanation on how to use RPE scale   Expected Outcomes  Short Term: Able to use RPE daily in rehab to express subjective intensity level;Long Term:  Able to use RPE to guide intensity level when exercising independently   Knowledge and understanding of Target Heart Rate Range (THRR)  Yes   Intervention  Provide education and explanation of THRR including how the numbers were predicted and  where they are located for reference   Expected Outcomes  Short Term: Able to state/look up THRR;Long Term: Able to use THRR to govern intensity when exercising independently;Short Term: Able to use daily as guideline for intensity in rehab   Able to check pulse independently  Yes   Intervention  Provide education and demonstration on how to check pulse in carotid and radial arteries.;Review the importance of being able to check your own pulse for safety during independent exercise   Expected Outcomes  Short Term: Able to explain why pulse checking is important during independent exercise;Long Term: Able to check pulse independently and accurately   Understanding of Exercise Prescription  Yes   Intervention  Provide education, explanation, and written materials on patient's individual exercise prescription   Expected Outcomes  Short Term: Able to explain program exercise prescription;Long Term: Able to explain home exercise prescription to exercise independently          Exercise Goals Re-Evaluation :   Discharge Exercise Prescription (Final Exercise Prescription Changes):   Nutrition:  Target Goals: Understanding of nutrition guidelines, daily intake of sodium 1500mg , cholesterol 200mg , calories 30% from fat and 7% or less from saturated fats, daily to have 5 or more servings of fruits and vegetables.  Biometrics: Pre Biometrics - 02/14/18 1048    Pre Biometrics          Height  6\' 1"  (1.854 m)    Weight  196 lb 13.9 oz (89.3 kg)  Waist Circumference  39 inches    Hip Circumference  40 inches    Waist to Hip Ratio  0.98 %    BMI (Calculated)  25.98    Triceps Skinfold  26 mm    % Body Fat  28.6 %    Grip Strength  37 kg    Flexibility  11 in    Single Leg Stand  0 seconds            Nutrition Therapy Plan and Nutrition Goals: Nutrition Therapy & Goals - 02/14/18 1047    Nutrition Therapy          Diet  consistent carbohydrate heart healthy        Personal  Nutrition Goals          Nutrition Goal  Pt to identify and limit food sources of saturated fat, trans fat, and sodium        Intervention Plan          Intervention  Prescribe, educate and counsel regarding individualized specific dietary modifications aiming towards targeted core components such as weight, hypertension, lipid management, diabetes, heart failure and other comorbidities.    Expected Outcomes  Short Term Goal: Understand basic principles of dietary content, such as calories, fat, sodium, cholesterol and nutrients.           Nutrition Assessments: Nutrition Assessments - 02/14/18 1047    MEDFICTS Scores          Pre Score  46           Nutrition Goals Re-Evaluation:   Nutrition Goals Discharge (Final Nutrition Goals Re-Evaluation):   Psychosocial: Target Goals: Acknowledge presence or absence of significant depression and/or stress, maximize coping skills, provide positive support system. Participant is able to verbalize types and ability to use techniques and skills needed for reducing stress and depression.  Initial Review & Psychosocial Screening: Initial Psych Review & Screening - 02/21/18 1606    Initial Review          Current issues with  None Identified        Family Dynamics          Good Support System?  Yes Raffaela Martino        Barriers          Psychosocial barriers to participate in program  There are no identifiable barriers or psychosocial needs.        Screening Interventions          Interventions  Encouraged to exercise           Quality of Life Scores: Quality of Life - 02/14/18 1053    Quality of Life          Select  Quality of Life        Quality of Life Scores          Health/Function Pre  28.4 %    Socioeconomic Pre  30 %    Psych/Spiritual Pre  30 %    Family Pre  30 %    GLOBAL Pre  29.25 %          Scores of 19 and below usually indicate a poorer quality of life in these areas.  A difference of   2-3 points is a clinically meaningful difference.  A difference of 2-3 points in the total score of the Quality of Life Index has been associated with significant improvement in overall quality of life, self-image, physical symptoms, and general health in  studies assessing change in quality of life.   PHQ-9: Recent Review Flowsheet Data    Depression screen [facility] 2/9 02/21/2018   Decreased Interest 0   Down, Depressed, Hopeless 0   PHQ - 2 Score 0     Interpretation of Total Score  Total Score Depression Severity:  1-4 = Minimal depression, 5-9 = Mild depression, 10-14 = Moderate depression, 15-19 = Moderately severe depression, 20-27 = Severe depression   Psychosocial Evaluation and Intervention:   Psychosocial Re-Evaluation:   Psychosocial Discharge (Final Psychosocial Re-Evaluation):    Education: Education Goals: Education classes will be provided on a weekly basis, covering required topics. Participant will state understanding/return demonstration of topics presented.  Learning Barriers/Preferences: Learning Barriers/Preferences - 02/14/18 0254    Learning Barriers/Preferences          Learning Barriers  Sight    Learning Preferences  Pictoral;Verbal Instruction;Video;Skilled Demonstration;Audio;Written Material           Education Topics: How Lungs Work and Diseases: - Discuss the anatomy of the lungs and diseases that can affect the lungs, such as COPD.   Exercise: -Discuss the importance of exercise, FITT principles of exercise, normal and abnormal responses to exercise, and how to exercise safely.   Environmental Irritants: -Discuss types of environmental irritants and how to limit exposure to environmental irritants.   Meds/Inhalers and oxygen: - Discuss respiratory medications, definition of an inhaler and oxygen, and the proper way to use an inhaler and oxygen.   Energy Saving Techniques: - Discuss methods to conserve energy and decrease shortness of  breath when performing activities of daily living.    Bronchial Hygiene / Breathing Techniques: - Discuss breathing mechanics, pursed-lip breathing technique,  proper posture, effective ways to clear airways, and other functional breathing techniques   Cleaning Equipment: - Provides group verbal and written instruction about the health risks of elevated stress, cause of high stress, and healthy ways to reduce stress.   Nutrition I: Fats: - Discuss the types of cholesterol, what cholesterol does to the body, and how cholesterol levels can be controlled.   Nutrition II: Labels: -Discuss the different components of food labels and how to read food labels.   Respiratory Infections: - Discuss the signs and symptoms of respiratory infections, ways to prevent respiratory infections, and the importance of seeking medical treatment when having a respiratory infection.   Stress I: Signs and Symptoms: - Discuss the causes of stress, how stress may lead to anxiety and depression, and ways to limit stress.   Stress II: Relaxation: -Discuss relaxation techniques to limit stress.   Oxygen for Home/Travel: - Discuss how to prepare for travel when on oxygen and proper ways to transport and store oxygen to ensure safety.   Knowledge Questionnaire Score: Knowledge Questionnaire Score - 02/14/18 1019    Knowledge Questionnaire Score          Pre Score  23/24           Core Components/Risk Factors/Patient Goals at Admission: Personal Goals and Risk Factors at Admission - 02/14/18 1049    Core Components/Risk Factors/Patient Goals on Admission          Diabetes  Yes    Intervention  Provide education about signs/symptoms and action to take for hypo/hyperglycemia.;Provide education about proper nutrition, including hydration, and aerobic/resistive exercise prescription along with prescribed medications to achieve blood glucose in normal ranges: Fasting glucose 65-99 mg/dL    Expected  Outcomes  Short Term: Participant verbalizes understanding of the signs/symptoms and  immediate care of hyper/hypoglycemia, proper foot care and importance of medication, aerobic/resistive exercise and nutrition plan for blood glucose control.;Long Term: Attainment of HbA1C < 7%.    Hypertension  Yes    Intervention  Provide education on lifestyle modifcations including regular physical activity/exercise, weight management, moderate sodium restriction and increased consumption of fresh fruit, vegetables, and low fat dairy, alcohol moderation, and smoking cessation.;Monitor prescription use compliance.    Expected Outcomes  Short Term: Continued assessment and intervention until BP is < 140/15mm HG in hypertensive participants. < 130/13mm HG in hypertensive participants with diabetes, heart failure or chronic kidney disease.;Long Term: Maintenance of blood pressure at goal levels.    Lipids  Yes    Intervention  Provide education and support for participant on nutrition & aerobic/resistive exercise along with prescribed medications to achieve LDL 70mg , HDL >40mg .    Expected Outcomes  Short Term: Participant states understanding of desired cholesterol values and is compliant with medications prescribed. Participant is following exercise prescription and nutrition guidelines.;Long Term: Cholesterol controlled with medications as prescribed, with individualized exercise RX and with personalized nutrition plan. Value goals: LDL < 70mg , HDL > 40 mg.           Core Components/Risk Factors/Patient Goals Review:  Goals and Risk Factor Review    Core Components/Risk Factors/Patient Goals Review    Row Name 02/21/18 0807   Personal Goals Review  Diabetes;Hypertension;Lipids   Review  pt with multiple CAD RF demonstrates willingness to participate in CR program.  pt demonstrates excellent DM self management. pt checks CBG TID for good and verbalizes desire to maintain good ranges. pt personal goals are to  attend cardiac rehab and improve health   Expected Outcomes  pt will participate in CR exercise, nutrition and lifestyle modification to decrease overall RF.           Core Components/Risk Factors/Patient Goals at Discharge (Final Review):  Goals and Risk Factor Review - 02/21/18 0807    Core Components/Risk Factors/Patient Goals Review          Personal Goals Review  Diabetes;Hypertension;Lipids    Review  pt with multiple CAD RF demonstrates willingness to participate in CR program.  pt demonstrates excellent DM self management. pt checks CBG TID for good and verbalizes desire to maintain good ranges. pt personal goals are to attend cardiac rehab and improve health    Expected Outcomes  pt will participate in CR exercise, nutrition and lifestyle modification to decrease overall RF.            ITP Comments: ITP Comments    Row Name 02/14/18 [phone] 02/20/18 0804   ITP Comments  Dr. Fransico Him, Medical Director  pt started group exercise. pt tolerated light activity without difficulty. pt oriented to exercise equipment and safety routine.       Comments: Pt started cardiac rehab today.  Pt tolerated light exercise without difficulty. VSS, telemetry-sinus rhythm, asymptomatic.  Medication list reconciled. Pt denies barriers to medicaiton compliance.  PSYCHOSOCIAL ASSESSMENT:  PHQ-0. Pt exhibits positive coping skills, hopeful outlook with supportive family. No psychosocial needs identified at this time, no psychosocial interventions necessary.   Pt oriented to exercise equipment and routine.    Understanding verbalized.  Andi Hence, RN, BSN Cardiac Pulmonary Rehab

## 2018-02-22 ENCOUNTER — Encounter (HOSPITAL_COMMUNITY)
Admission: RE | Admit: 2018-02-22 | Discharge: 2018-02-22 | Disposition: A | Discharge status: Home / Self Care | Payer: Medicare Other | Source: Ambulatory Visit | Attending: Cardiology | Admitting: Cardiology

## 2018-02-22 ENCOUNTER — Encounter (HOSPITAL_COMMUNITY): Payer: Medicare Other

## 2018-02-22 VITALS — Ht 73.0 in | Wt 196.9 lb

## 2018-02-22 DIAGNOSIS — E119 Type 2 diabetes mellitus without complications: Secondary | ICD-10-CM | POA: Diagnosis not present

## 2018-02-22 DIAGNOSIS — Z955 Presence of coronary angioplasty implant and graft: Secondary | ICD-10-CM | POA: Diagnosis not present

## 2018-02-22 DIAGNOSIS — I10 Essential (primary) hypertension: Secondary | ICD-10-CM | POA: Diagnosis not present

## 2018-02-22 DIAGNOSIS — E7800 Pure hypercholesterolemia, unspecified: Secondary | ICD-10-CM | POA: Diagnosis not present

## 2018-02-22 DIAGNOSIS — I252 Old myocardial infarction: Secondary | ICD-10-CM | POA: Diagnosis not present

## 2018-02-22 DIAGNOSIS — Z950 Presence of cardiac pacemaker: Secondary | ICD-10-CM | POA: Diagnosis not present

## 2018-02-22 LAB — GLUCOSE, CAPILLARY: Glucose-Capillary: 148 mg/dL — ABNORMAL HIGH (ref 70–99)

## 2018-02-22 NOTE — Progress Notes (Signed)
Jesse [name] 77 y.o. male Nutrition Note Spoke with pt. Nutrition plan and goals reviewed with pt. Pt is following Step 1 of the Therapeutic Lifestyle Changes diet. Pt wants to maintain current weight. Pt is diabetic. Don't have recent A1c for patient. This Probation officer went over Diabetes Education test results. Pt checks CBG's 3x times a day. Fasting CBG's reportedly 120-135 mg/dL. Per discussion, pt does not use canned/convenience foods often. Pt rarely adds salt to food. Pt eats out 1x week and usually gets fried fish. Pt shared he usually eats a mix of higher fat cuts of meat and lean proteins, has ice cream once a week, has 2 small pieces of german dark chocolate after dinner. Discussed with patient incorporating more lean proteins, to help decrease intake of saturated fat. Additionally recommended alternative cooking methods for fish, to decrease saturated and trans fats consumed. Recommended replacing refined carbohydrates with complex carbohydrates. Additionally reviewed label reading with patient and distributed handout. Pt expressed understanding of the information reviewed. Pt aware of nutrition education classes offered and will attend nutrition classes as available.    No results found for: HGBA1C  Wt Readings from Last 3 Encounters:  02/22/18 196 lb 13.9 oz (89.3 kg)  02/14/18 196 lb 13.9 oz (89.3 kg)  11/30/17 200 lb 9.9 oz (91 kg)    Nutrition Diagnosis ? Food-and nutrition-related knowledge deficit related to lack of exposure to information as related to diagnosis of: ? CVD ? DM  ? Overweight related to excessive energy intake as evidenced by a BMI of Body mass index is 25.97 kg/m.  Nutrition Intervention ? Pt's individual nutrition plan reviewed with pt. ? Benefits of adopting Heart Healthy diet discussed when Med[employer]icts reviewed.    Goal(s)  ? Pt to identify and limit food sources of saturated fat, trans fat, and sodium  Plan:  Pt to attend nutrition classes ? Nutrition I ?  Nutrition II ? Portion Distortion  Will provide client-centered nutrition education as part of interdisciplinary care.   Monitor and evaluate progress toward nutrition goal with team.   Laurina Bustle, MS, RD, LDN 02/22/2018 9:41 AM

## 2018-02-24 ENCOUNTER — Encounter (HOSPITAL_COMMUNITY): Payer: Self-pay

## 2018-02-24 ENCOUNTER — Encounter (HOSPITAL_COMMUNITY): Payer: Medicare Other

## 2018-02-24 ENCOUNTER — Encounter (HOSPITAL_COMMUNITY)
Admission: RE | Admit: 2018-02-24 | Discharge: 2018-02-24 | Disposition: A | Discharge status: Home / Self Care | Payer: Medicare Other | Source: Ambulatory Visit | Attending: Cardiology | Admitting: Cardiology

## 2018-02-24 DIAGNOSIS — E119 Type 2 diabetes mellitus without complications: Secondary | ICD-10-CM | POA: Diagnosis not present

## 2018-02-24 DIAGNOSIS — Z79899 Other long term (current) drug therapy: Secondary | ICD-10-CM | POA: Diagnosis not present

## 2018-02-24 DIAGNOSIS — Z7982 Long term (current) use of aspirin: Secondary | ICD-10-CM | POA: Diagnosis not present

## 2018-02-24 DIAGNOSIS — I10 Essential (primary) hypertension: Secondary | ICD-10-CM | POA: Insufficient documentation

## 2018-02-24 DIAGNOSIS — Z950 Presence of cardiac pacemaker: Secondary | ICD-10-CM | POA: Insufficient documentation

## 2018-02-24 DIAGNOSIS — E7800 Pure hypercholesterolemia, unspecified: Secondary | ICD-10-CM | POA: Insufficient documentation

## 2018-02-24 DIAGNOSIS — Z7984 Long term (current) use of oral hypoglycemic drugs: Secondary | ICD-10-CM | POA: Diagnosis not present

## 2018-02-24 DIAGNOSIS — R0781 Pleurodynia: Secondary | ICD-10-CM | POA: Diagnosis not present

## 2018-02-24 DIAGNOSIS — I2510 Atherosclerotic heart disease of native coronary artery without angina pectoris: Secondary | ICD-10-CM | POA: Insufficient documentation

## 2018-02-24 DIAGNOSIS — Z7901 Long term (current) use of anticoagulants: Secondary | ICD-10-CM | POA: Diagnosis not present

## 2018-02-24 DIAGNOSIS — I252 Old myocardial infarction: Secondary | ICD-10-CM | POA: Diagnosis not present

## 2018-02-24 DIAGNOSIS — Z955 Presence of coronary angioplasty implant and graft: Secondary | ICD-10-CM | POA: Diagnosis not present

## 2018-02-24 LAB — GLUCOSE, CAPILLARY: Glucose-Capillary: 137 mg/dL — ABNORMAL HIGH (ref 70–99)

## 2018-02-27 ENCOUNTER — Encounter (HOSPITAL_COMMUNITY): Payer: Medicare Other

## 2018-02-27 ENCOUNTER — Encounter (HOSPITAL_COMMUNITY)
Admission: RE | Admit: 2018-02-27 | Discharge: 2018-02-27 | Disposition: A | Discharge status: Home / Self Care | Payer: Medicare Other | Source: Ambulatory Visit | Attending: Cardiology | Admitting: Cardiology

## 2018-02-27 DIAGNOSIS — R0602 Shortness of breath: Secondary | ICD-10-CM | POA: Diagnosis not present

## 2018-02-27 DIAGNOSIS — E119 Type 2 diabetes mellitus without complications: Secondary | ICD-10-CM | POA: Diagnosis not present

## 2018-02-27 DIAGNOSIS — Z955 Presence of coronary angioplasty implant and graft: Secondary | ICD-10-CM | POA: Diagnosis not present

## 2018-02-27 DIAGNOSIS — I252 Old myocardial infarction: Secondary | ICD-10-CM | POA: Diagnosis not present

## 2018-02-27 DIAGNOSIS — I10 Essential (primary) hypertension: Secondary | ICD-10-CM | POA: Diagnosis not present

## 2018-02-27 DIAGNOSIS — I2510 Atherosclerotic heart disease of native coronary artery without angina pectoris: Secondary | ICD-10-CM | POA: Diagnosis not present

## 2018-02-27 DIAGNOSIS — E7800 Pure hypercholesterolemia, unspecified: Secondary | ICD-10-CM | POA: Diagnosis not present

## 2018-02-27 DIAGNOSIS — Z950 Presence of cardiac pacemaker: Secondary | ICD-10-CM | POA: Diagnosis not present

## 2018-03-01 ENCOUNTER — Encounter (HOSPITAL_COMMUNITY)
Admission: RE | Admit: 2018-03-01 | Discharge: 2018-03-01 | Disposition: A | Discharge status: Home / Self Care | Payer: Medicare Other | Source: Ambulatory Visit | Attending: Cardiology | Admitting: Cardiology

## 2018-03-01 ENCOUNTER — Encounter (HOSPITAL_COMMUNITY): Payer: Medicare Other

## 2018-03-01 DIAGNOSIS — Z950 Presence of cardiac pacemaker: Secondary | ICD-10-CM | POA: Diagnosis not present

## 2018-03-01 DIAGNOSIS — I10 Essential (primary) hypertension: Secondary | ICD-10-CM | POA: Diagnosis not present

## 2018-03-01 DIAGNOSIS — E119 Type 2 diabetes mellitus without complications: Secondary | ICD-10-CM | POA: Diagnosis not present

## 2018-03-01 DIAGNOSIS — Z955 Presence of coronary angioplasty implant and graft: Secondary | ICD-10-CM | POA: Diagnosis not present

## 2018-03-01 DIAGNOSIS — I252 Old myocardial infarction: Secondary | ICD-10-CM | POA: Diagnosis not present

## 2018-03-01 DIAGNOSIS — E7800 Pure hypercholesterolemia, unspecified: Secondary | ICD-10-CM | POA: Diagnosis not present

## 2018-03-01 NOTE — Progress Notes (Signed)
Cardiac Individual Treatment Plan  Patient Details  Name: Jesse [name] MRN: [id number] Date of Birth: 08/02/1940 Referring Provider:   Flowsheet Row CARDIAC REHAB PHASE II ORIENTATION from 02/14/2018 in [place]  Referring Provider  Adrian Prows MD      Initial Encounter Date:  [place] PHASE II ORIENTATION from 02/14/2018 in [place]  Date  02/14/18      Visit Diagnosis: 11/29/17 Stented coronary artery  Patient's Home Medications on Admission:  Current Outpatient Medications:  .  aspirin 81 MG chewable tablet, Chew 1 tablet (81 mg total) by mouth daily., Disp: 30 tablet, Rfl: 0 .  Biotin 5000 MCG TABS, Take 5,000 mcg by mouth daily., Disp: , Rfl:  .  dorzolamide-timolol (COSOPT) 22.3-6.8 MG/ML ophthalmic solution, Place 1 drop into both eyes 2 (two) times daily., Disp: , Rfl: 10 .  glimepiride (AMARYL) 2 MG tablet, Take 2 mg by mouth 2 (two) times daily., Disp: , Rfl:  .  latanoprost (XALATAN) 0.005 % ophthalmic solution, Place 1 drop into both eyes at bedtime., Disp: , Rfl: 12 .  losartan (COZAAR) 25 MG tablet, Take 25 mg by mouth daily., Disp: , Rfl:  .  metFORMIN (GLUCOPHAGE-XR) 500 MG 24 hr tablet, Take 1 tablet (500 mg total) by mouth daily., Disp: , Rfl: 1 .  metoprolol tartrate (LOPRESSOR) 25 MG tablet, Take 0.5 tablets (12.5 mg total) by mouth 3 (three) times daily. (Patient taking differently: Take 25 mg by mouth 2 (two) times daily. ), Disp: 90 tablet, Rfl: 0 .  nitroGLYCERIN (NITROSTAT) 0.4 MG SL tablet, Place 0.4 mg under the tongue every 5 (five) minutes as needed for chest pain. , Disp: , Rfl: 3 .  Omega-3 Fatty Acids (FISH OIL PO), Take 1 capsule by mouth daily., Disp: , Rfl:  .  rivaroxaban (XARELTO) 2.5 MG TABS tablet, Take 2.5 mg by mouth 2 (two) times daily., Disp: , Rfl:  .  simvastatin (ZOCOR) 40 MG tablet, Take 40 mg by mouth daily., Disp: , Rfl:  .  ticagrelor (BRILINTA) 90 MG  TABS tablet, Take 1 tablet (90 mg total) by mouth 2 (two) times daily., Disp: 60 tablet, Rfl: 3 .  vitamin B-12 (CYANOCOBALAMIN) 1000 MCG tablet, Take 1,000 mcg by mouth daily., Disp: , Rfl:   Past Medical History: Past Medical History:  Diagnosis Date  . Coronary artery disease   . High cholesterol   . Hypertension   . 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  [place] Kitchen Presence of permanent cardiac pacemaker   . Type II diabetes mellitus (HCC)     Tobacco Use: Social History   Tobacco Use  Smoking Status Former Smoker  . Years: 3.00  . Types: Cigarettes  . Last attempt to quit: 1960  . Years since quitting: 59.6  Smokeless Tobacco Never Used    Labs: Recent Chemical engineer    Labs for ITP Cardiac and Pulmonary Rehab Latest Ref Rng & Units 02/05/2015   Cholestrol 0 - 200 mg/dL 108   LDLCALC 0 - 99 mg/dL 42   HDL >40 mg/dL 21(L)   Trlycerides <150 mg/dL 226(H)      Capillary Blood Glucose: Lab Results  Component Value Date   GLUCAP 137 (H) 02/24/2018   GLUCAP 148 (H) 02/22/2018   GLUCAP 106 (H) 02/20/2018   GLUCAP 143 (H) 02/20/2018   GLUCAP 131 (H) 11/30/2017     Exercise Target Goals:    Exercise Program Goal: Individual exercise prescription  set using results from initial 6 min walk test and THRR while considering  patient's activity barriers and safety.   Exercise Prescription Goal: Initial exercise prescription builds to 30-45 minutes a day of aerobic activity, 2-3 days per week.  Home exercise guidelines will be given to patient during program as part of exercise prescription that the participant will acknowledge.  Activity Barriers & Risk Stratification: Activity Barriers & Cardiac Risk Stratification - 02/14/18 0808    Activity Barriers & Cardiac Risk Stratification          Activity Barriers  Deconditioning;Muscular Weakness;Shortness of Breath    Cardiac Risk Stratification  High           6 Minute Walk: 6 Minute Walk    6 Minute Walk    Row  Name 02/14/18 1020 02/14/18 1050   Phase  Initial  no documentation   Distance  1441 feet  no documentation   Walk Time  6 minutes  no documentation   # of Rest Breaks  0  no documentation   MPH  2.7  no documentation   METS  2.8  no documentation   RPE  11  no documentation   VO2 Peak  9.8  no documentation   Symptoms  No  no documentation   Resting HR  82 bpm  no documentation   Resting BP  104/64  no documentation   Resting Oxygen Saturation   98 %  no documentation   Exercise Oxygen Saturation  during 6 min walk  97 %  no documentation   Max Ex. HR  96 bpm  no documentation   Max Ex. BP  129/70  120/70   2 Minute Post BP  118/60  no documentation          Oxygen Initial Assessment:   Oxygen Re-Evaluation:   Oxygen Discharge (Final Oxygen Re-Evaluation):   Initial Exercise Prescription: Initial Exercise Prescription - 02/14/18 1000    Date of Initial Exercise RX and Referring Provider          Date  02/14/18    Referring Provider  Adrian Prows MD        Treadmill          MPH  2.2    Grade  1    Minutes  10    METs  2.99        Bike          Level  0.5    Minutes  10    METs  2.05        NuStep          Level  3    SPM  80    Minutes  10    METs  2        Prescription Details          Frequency (times per week)  3    Duration  Progress to 30 minutes of continuous aerobic without signs/symptoms of physical distress        Intensity          THRR 40-80% of Max Heartrate  57-114    Ratings of Perceived Exertion  11-13    Perceived Dyspnea  0-4        Progression          Progression  Continue to progress workloads to maintain intensity without signs/symptoms of physical distress.        Resistance Training  Training Prescription  Yes    Weight  2lbs    Reps  10-15           Perform Capillary Blood Glucose checks as needed.  Exercise Prescription Changes: Exercise Prescription Changes    Response to Exercise    Row  Name 02/20/18 1101 02/24/18 1100   Blood Pressure (Admit)  122/60  110/70   Blood Pressure (Exercise)  148/80  158/84   Blood Pressure (Exit)  128/70  104/64   Heart Rate (Admit)  65 bpm  68 bpm   Heart Rate (Exercise)  119 bpm  116 bpm   Heart Rate (Exit)  78 bpm  83 bpm   Rating of Perceived Exertion (Exercise)  11  12   Symptoms  none  none   Comments  pt was oriented to exercise equipment today  no documentation   Duration  Continue with 30 min of aerobic exercise without signs/symptoms of physical distress.  Continue with 30 min of aerobic exercise without signs/symptoms of physical distress.   Intensity  THRR unchanged  THRR unchanged       Progression    Row Name 02/20/18 1101 02/24/18 1100   Progression  Continue to progress workloads to maintain intensity without signs/symptoms of physical distress.  Continue to progress workloads to maintain intensity without signs/symptoms of physical distress.   Average METs  2.4  2.9       Resistance Training    Row Name 02/20/18 1101 02/24/18 1100   Training Prescription  Yes  Yes   Weight  2lbs  2lbs   Reps  10-15  10-15   Time  10 Minutes  [place] Name 02/20/18 1101 02/24/18 1100   Level  0.5  0.5   Minutes  10  10   METs  2.06  2.06       NuStep    Row Name 02/20/18 1101 02/24/18 1100   Level  3  3   SPM  80  90   Minutes  10  10   METs  2.6  2.9       Track    Row Name 02/20/18 1101 02/24/18 1100   Laps  8  16   Minutes  10  10   METs  2.39  3.79          Exercise Comments: Exercise Comments    Row Name 02/20/18 1109   Exercise Comments  Pt completed first exercise session without signs and symptoms of physical distress. Rehab team will continue to monitor activity levels and progress as tolerated.       Exercise Goals and Review: Exercise Goals    Exercise Goals    Row Name 02/14/18 0808   Increase Physical Activity  Yes   Intervention  Provide advice, education, support and  counseling about physical activity/exercise needs.;Develop an individualized exercise prescription for aerobic and resistive training based on initial evaluation findings, risk stratification, comorbidities and participant's personal goals.   Expected Outcomes  Short Term: Attend rehab on a regular basis to increase amount of physical activity.;Long Term: Add in home exercise to make exercise part of routine and to increase amount of physical activity.;Long Term: Exercising regularly at least 3-5 days a week.   Increase Strength and Stamina  Yes   Intervention  Provide advice, education, support and counseling about physical activity/exercise needs.;Develop an individualized exercise prescription for aerobic and resistive training based on  initial evaluation findings, risk stratification, comorbidities and participant's personal goals.   Expected Outcomes  Short Term: Increase workloads from initial exercise prescription for resistance, speed, and METs.;Short Term: Perform resistance training exercises routinely during rehab and add in resistance training at home;Long Term: Improve cardiorespiratory fitness, muscular endurance and strength as measured by increased METs and functional capacity (6MWT)   Able to understand and use rate of perceived exertion (RPE) scale  Yes   Intervention  Provide education and explanation on how to use RPE scale   Expected Outcomes  Short Term: Able to use RPE daily in rehab to express subjective intensity level;Long Term:  Able to use RPE to guide intensity level when exercising independently   Knowledge and understanding of Target Heart Rate Range (THRR)  Yes   Intervention  Provide education and explanation of THRR including how the numbers were predicted and where they are located for reference   Expected Outcomes  Short Term: Able to state/look up THRR;Long Term: Able to use THRR to govern intensity when exercising independently;Short Term: Able to use daily as  guideline for intensity in rehab   Able to check pulse independently  Yes   Intervention  Provide education and demonstration on how to check pulse in carotid and radial arteries.;Review the importance of being able to check your own pulse for safety during independent exercise   Expected Outcomes  Short Term: Able to explain why pulse checking is important during independent exercise;Long Term: Able to check pulse independently and accurately   Understanding of Exercise Prescription  Yes   Intervention  Provide education, explanation, and written materials on patient's individual exercise prescription   Expected Outcomes  Short Term: Able to explain program exercise prescription;Long Term: Able to explain home exercise prescription to exercise independently          Exercise Goals Re-Evaluation : Exercise Goals Re-Evaluation    Exercise Goal Re-Evaluation    Row Name 02/24/18 1107   Exercise Goals Review  Increase Physical Activity;Increase Strength and Stamina;Understanding of Exercise Prescription;Able to understand and use rate of perceived exertion (RPE) scale   Comments  Pt is able to exercise for 30 minutes without signs/symptoms of physical distress and has a great understanding of exercise program and RPE scale.    Expected Outcomes  Pt will improve in cardiorespiratory fitness and overall functional capacity.           Discharge Exercise Prescription (Final Exercise Prescription Changes): Exercise Prescription Changes - 02/24/18 1100    Response to Exercise          Blood Pressure (Admit)  110/70    Blood Pressure (Exercise)  158/84    Blood Pressure (Exit)  104/64    Heart Rate (Admit)  68 bpm    Heart Rate (Exercise)  116 bpm    Heart Rate (Exit)  83 bpm    Rating of Perceived Exertion (Exercise)  12    Symptoms  none    Duration  Continue with 30 min of aerobic exercise without signs/symptoms of physical distress.    Intensity  THRR unchanged        Progression           Progression  Continue to progress workloads to maintain intensity without signs/symptoms of physical distress.    Average METs  2.9        Resistance Training          Training Prescription  Yes    Weight  2lbs  Reps  10-15    Time  10 Minutes        Bike          Level  0.5    Minutes  10    METs  2.06        NuStep          Level  3    SPM  90    Minutes  10    METs  2.9        Track          Laps  16    Minutes  10    METs  3.79           Nutrition:  Target Goals: Understanding of nutrition guidelines, daily intake of sodium 1500mg , cholesterol 200mg , calories 30% from fat and 7% or less from saturated fats, daily to have 5 or more servings of fruits and vegetables.  Biometrics: Pre Biometrics - 02/14/18 1048    Pre Biometrics          Height  6\' 1"  (1.854 m)    Weight  196 lb 13.9 oz (89.3 kg)    Waist Circumference  39 inches    Hip Circumference  40 inches    Waist to Hip Ratio  0.98 %    BMI (Calculated)  25.98    Triceps Skinfold  26 mm    % Body Fat  28.6 %    Grip Strength  37 kg    Flexibility  11 in    Single Leg Stand  0 seconds            Nutrition Therapy Plan and Nutrition Goals: Nutrition Therapy & Goals - 02/14/18 1047    Nutrition Therapy          Diet  consistent carbohydrate heart healthy        Personal Nutrition Goals          Nutrition Goal  Pt to identify and limit food sources of saturated fat, trans fat, and sodium        Intervention Plan          Intervention  Prescribe, educate and counsel regarding individualized specific dietary modifications aiming towards targeted core components such as weight, hypertension, lipid management, diabetes, heart failure and other comorbidities.    Expected Outcomes  Short Term Goal: Understand basic principles of dietary content, such as calories, fat, sodium, cholesterol and nutrients.           Nutrition Assessments: Nutrition Assessments - 02/14/18 1047     MEDFICTS Scores          Pre Score  46           Nutrition Goals Re-Evaluation:   Nutrition Goals Re-Evaluation:   Nutrition Goals Discharge (Final Nutrition Goals Re-Evaluation):   Psychosocial: Target Goals: Acknowledge presence or absence of significant depression and/or stress, maximize coping skills, provide positive support system. Participant is able to verbalize types and ability to use techniques and skills needed for reducing stress and depression.  Initial Review & Psychosocial Screening: Initial Psych Review & Screening - 02/21/18 1606    Initial Review          Current issues with  None Identified        Family Dynamics          Good Support System?  Yes Raffaela Martino        Barriers          Psychosocial  barriers to participate in program  There are no identifiable barriers or psychosocial needs.        Screening Interventions          Interventions  Encouraged to exercise           Quality of Life Scores: Quality of Life - 02/14/18 1053    Quality of Life          Select  Quality of Life        Quality of Life Scores          Health/Function Pre  28.4 %    Socioeconomic Pre  30 %    Psych/Spiritual Pre  30 %    Family Pre  30 %    GLOBAL Pre  29.25 %          Scores of 19 and below usually indicate a poorer quality of life in these areas.  A difference of  2-3 points is a clinically meaningful difference.  A difference of 2-3 points in the total score of the Quality of Life Index has been associated with significant improvement in overall quality of life, self-image, physical symptoms, and general health in studies assessing change in quality of life.  PHQ-9: Recent Review Flowsheet Data    Depression screen [facility] 2/9 02/21/2018   Decreased Interest 0   Down, Depressed, Hopeless 0   PHQ - 2 Score 0     Interpretation of Total Score  Total Score Depression Severity:  1-4 = Minimal depression, 5-9 = Mild depression, 10-14 =  Moderate depression, 15-19 = Moderately severe depression, 20-27 = Severe depression   Psychosocial Evaluation and Intervention: Psychosocial Evaluation - 02/21/18 1610    Psychosocial Evaluation & Interventions          Interventions  Encouraged to exercise with the program and follow exercise prescription    Comments  no psychosocial needs identified, no interventions necessary. pt enjoys watching TV, gardening, walking and using his computer.      Expected Outcomes  pt will exhibit positive outlook with good coping skills.     Continue Psychosocial Services   No Follow up required           Psychosocial Re-Evaluation: Psychosocial Re-Evaluation    Psychosocial Re-Evaluation    [place] Name 02/24/18 1221   Current issues with  None Identified   Comments  no psychosocial needs identified, no interventions necessary    Expected Outcomes  pt will exhibit positive outlook with good coping skills.    Interventions  Encouraged to attend Cardiac Rehabilitation for the exercise   Continue Psychosocial Services   No Follow up required          Psychosocial Discharge (Final Psychosocial Re-Evaluation): Psychosocial Re-Evaluation - 02/24/18 1221    Psychosocial Re-Evaluation          Current issues with  None Identified    Comments  no psychosocial needs identified, no interventions necessary     Expected Outcomes  pt will exhibit positive outlook with good coping skills.     Interventions  Encouraged to attend Cardiac Rehabilitation for the exercise    Continue Psychosocial Services   No Follow up required           Vocational Rehabilitation: Provide vocational rehab assistance to qualifying candidates.   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 Participant will state understanding/return demonstration of topics presented.  Learning Barriers/Preferences: Learning Barriers/Preferences - 02/14/18 7510    Learning Barriers/Preferences          Learning Barriers  Sight    Learning Preferences  Pictoral;Verbal Instruction;Video;Skilled Demonstration;Audio;Written Material           Education Topics: Count Your Pulse:  -Group instruction provided by verbal instruction, demonstration, patient participation and written materials to support subject.  Instructors address importance of being able to find your pulse and how to count your pulse when at home without a heart monitor.  Patients get hands on experience counting their pulse with staff help and individually.   Heart Attack, Angina, and Risk Factor Modification:  -Group instruction provided by verbal instruction, video, and written materials to support subject.  Instructors address signs and symptoms of angina and heart attacks.    Also discuss risk factors for heart disease and how to make changes to improve heart health risk factors. Flowsheet Row CARDIAC REHAB PHASE II EXERCISE from 03/01/2018 in [place]  Date  03/01/18  Educator  RN  Instruction Review Code  2- Demonstrated Understanding      Functional Fitness:  -Group instruction provided by verbal instruction, demonstration, patient participation, and written materials to support subject.  Instructors address safety measures for doing things around the house.  Discuss how to get up and down off the floor, how to pick things up properly, how to safely get out of a chair without assistance, and balance training.   Meditation and Mindfulness:  -Group instruction provided by verbal instruction, patient participation, and written materials to support subject.  Instructor addresses importance of mindfulness and  meditation practice to help reduce stress and improve awareness.  Instructor also leads participants through a meditation exercise.    Stretching for Flexibility and Mobility:  -Group instruction provided by verbal instruction, patient participation, and written materials to support subject.  Instructors lead participants through series of stretches that are designed to increase flexibility thus improving mobility.  These stretches are additional exercise for major muscle groups that are typically performed during regular warm up and cool down.   Hands Only CPR:  -Group verbal, video, and participation provides a basic overview of AHA guidelines for community CPR. Role-play of emergencies allow participants the opportunity to practice calling for help and chest compression technique with discussion of AED use.   Hypertension: -Group verbal and written instruction that provides a basic overview of hypertension including the most recent diagnostic guidelines, risk factor reduction with self-care instructions and medication management.    Nutrition I class: Heart Healthy Eating:  -Group instruction provided by PowerPoint slides, verbal discussion, and written materials to support subject matter. The instructor gives an explanation and review of the Therapeutic Lifestyle Changes diet recommendations, which includes a discussion on lipid goals, dietary fat, sodium, fiber, plant stanol/sterol esters, sugar, and the components of a well-balanced, healthy diet.   Nutrition II class: Lifestyle Skills:  -Group instruction provided by PowerPoint slides, verbal discussion, and written materials to support subject matter. The instructor gives an explanation and review of label reading, grocery shopping for heart health, heart healthy recipe modifications, and ways to make healthier choices when eating out.   Diabetes Question & Answer:  -Group instruction provided  by PowerPoint slides, verbal discussion,  and written materials to support subject matter. The instructor gives an explanation and review of diabetes co-morbidities, pre- and post-prandial blood glucose goals, pre-exercise blood glucose goals, signs, symptoms, and treatment of hypoglycemia and hyperglycemia, and foot care basics.   Diabetes Blitz:  -Group instruction provided by PowerPoint slides, verbal discussion, and written materials to support subject matter. The instructor gives an explanation and review of the physiology behind type 1 and type 2 diabetes, diabetes medications and rational behind using different medications, pre- and post-prandial blood glucose recommendations and Hemoglobin A1c goals, diabetes diet, and exercise including blood glucose guidelines for exercising safely.    Portion Distortion:  -Group instruction provided by PowerPoint slides, verbal discussion, written materials, and food models to support subject matter. The instructor gives an explanation of serving size versus portion size, changes in portions sizes over the last 20 years, and what consists of a serving from each food group.   Stress Management:  -Group instruction provided by verbal instruction, video, and written materials to support subject matter.  Instructors review role of stress in heart disease and how to cope with stress positively.     Exercising on Your Own:  -Group instruction provided by verbal instruction, power point, and written materials to support subject.  Instructors discuss benefits of exercise, components of exercise, frequency and intensity of exercise, and end points for exercise.  Also discuss use of nitroglycerin and activating EMS.  Review options of places to exercise outside of rehab.  Review guidelines for sex with heart disease.   Cardiac Drugs I:  -Group instruction provided by verbal instruction and written materials to support subject.  Instructor reviews cardiac drug classes: antiplatelets, anticoagulants, beta  blockers, and statins.  Instructor discusses reasons, side effects, and lifestyle considerations for each drug class.   Cardiac Drugs II:  -Group instruction provided by verbal instruction and written materials to support subject.  Instructor reviews cardiac drug classes: angiotensin converting enzyme inhibitors (ACE-I), angiotensin II receptor blockers (ARBs), nitrates, and calcium channel blockers.  Instructor discusses reasons, side effects, and lifestyle considerations for each drug class.   Anatomy and Physiology of the Circulatory System:  Group verbal and written instruction and models provide basic cardiac anatomy and physiology, with the coronary electrical and arterial systems. Review of: AMI, Angina, Valve disease, Heart Failure, Peripheral Artery Disease, Cardiac Arrhythmia, Pacemakers, and the ICD. Flowsheet Row CARDIAC REHAB PHASE II EXERCISE from 03/01/2018 in [place]  Date  02/22/18  Educator  RN  Instruction Review Code  2- Demonstrated Understanding      Other Education:  -Group or individual verbal, written, or video instructions that support the educational goals of the cardiac rehab program.   Holiday Eating Survival Tips:  -Group instruction provided by PowerPoint slides, verbal discussion, and written materials to support subject matter. The instructor gives patients tips, tricks, and techniques to help them not only survive but enjoy the holidays despite the onslaught of food that accompanies the holidays.   Knowledge Questionnaire Score: Knowledge Questionnaire Score - 02/14/18 1019    Knowledge Questionnaire Score          Pre Score  23/24           Core Components/Risk Factors/Patient Goals at Admission: Personal Goals and Risk Factors at Admission - 02/14/18 1049    Core Components/Risk Factors/Patient Goals on Admission          Diabetes  Yes    Intervention  Provide education about signs/symptoms and action to take  for hypo/hyperglycemia.;Provide education about proper nutrition, including hydration, and aerobic/resistive exercise prescription along with prescribed medications to achieve blood glucose in normal ranges: Fasting glucose 65-99 mg/dL    Expected Outcomes  Short Term: Participant verbalizes understanding of the signs/symptoms and immediate care of hyper/hypoglycemia, proper foot care and importance of medication, aerobic/resistive exercise and nutrition plan for blood glucose control.;Long Term: Attainment of HbA1C < 7%.    Hypertension  Yes    Intervention  Provide education on lifestyle modifcations including regular physical activity/exercise, weight management, moderate sodium restriction and increased consumption of fresh fruit, vegetables, and low fat dairy, alcohol moderation, and smoking cessation.;Monitor prescription use compliance.    Expected Outcomes  Short Term: Continued assessment and intervention until BP is < 140/11mm HG in hypertensive participants. < 130/52mm HG in hypertensive participants with diabetes, heart failure or chronic kidney disease.;Long Term: Maintenance of blood pressure at goal levels.    Lipids  Yes    Intervention  Provide education and support for participant on nutrition & aerobic/resistive exercise along with prescribed medications to achieve LDL 70mg , HDL >40mg .    Expected Outcomes  Short Term: Participant states understanding of desired cholesterol values and is compliant with medications prescribed. Participant is following exercise prescription and nutrition guidelines.;Long Term: Cholesterol controlled with medications as prescribed, with individualized exercise RX and with personalized nutrition plan. Value goals: LDL < 70mg , HDL > 40 mg.           Core Components/Risk Factors/Patient Goals Review:  Goals and Risk Factor Review    Core Components/Risk Factors/Patient Goals Review    Row Name 02/21/18 0807 02/24/18 1219   Personal Goals Review   Diabetes;Hypertension;Lipids  Diabetes;Hypertension;Lipids   Review  pt with multiple CAD RF demonstrates willingness to participate in CR program.  pt demonstrates excellent DM self management. pt checks CBG TID for good and verbalizes desire to maintain good ranges. pt personal goals are to attend cardiac rehab and improve health  pt with multiple CAD RF demonstrates willingness to participate in CR program.  pt demonstrates excellent DM self management with good exercise CBG  trends.  pt personal goals are to attend cardiac rehab and improve health   Expected Outcomes  pt will participate in CR exercise, nutrition and lifestyle modification to decrease overall RF.   pt will participate in CR exercise, nutrition and lifestyle modification to decrease overall RF.           Core Components/Risk Factors/Patient Goals at Discharge (Final Review):  Goals and Risk Factor Review - 02/24/18 1219    Core Components/Risk Factors/Patient Goals Review          Personal Goals Review  Diabetes;Hypertension;Lipids    Review  pt with multiple CAD RF demonstrates willingness to participate in CR program.  pt demonstrates excellent DM self management with good exercise CBG  trends.  pt personal goals are to attend cardiac rehab and improve health    Expected Outcomes  pt will participate in CR exercise, nutrition and lifestyle modification to decrease overall RF.            ITP Comments: ITP Comments    Row Name 02/14/18 [phone] 02/20/18 0804 02/24/18 1219   ITP Comments  Dr. Fransico Him, Medical Director  pt started group exercise. pt tolerated light activity without difficulty. pt oriented to exercise equipment and safety routine.   30 day ITP review. pt demonstrates eagerness to participate in CR program.  Comments:

## 2018-03-03 ENCOUNTER — Encounter (HOSPITAL_COMMUNITY)
Admission: RE | Admit: 2018-03-03 | Discharge: 2018-03-03 | Disposition: A | Discharge status: Home / Self Care | Payer: Medicare Other | Source: Ambulatory Visit | Attending: Cardiology | Admitting: Cardiology

## 2018-03-03 ENCOUNTER — Encounter (HOSPITAL_COMMUNITY): Payer: Medicare Other

## 2018-03-03 DIAGNOSIS — I10 Essential (primary) hypertension: Secondary | ICD-10-CM | POA: Diagnosis not present

## 2018-03-03 DIAGNOSIS — E7800 Pure hypercholesterolemia, unspecified: Secondary | ICD-10-CM | POA: Diagnosis not present

## 2018-03-03 DIAGNOSIS — Z950 Presence of cardiac pacemaker: Secondary | ICD-10-CM | POA: Diagnosis not present

## 2018-03-03 DIAGNOSIS — Z955 Presence of coronary angioplasty implant and graft: Secondary | ICD-10-CM | POA: Diagnosis not present

## 2018-03-03 DIAGNOSIS — E119 Type 2 diabetes mellitus without complications: Secondary | ICD-10-CM | POA: Diagnosis not present

## 2018-03-03 DIAGNOSIS — I252 Old myocardial infarction: Secondary | ICD-10-CM | POA: Diagnosis not present

## 2018-03-06 ENCOUNTER — Encounter (HOSPITAL_COMMUNITY): Payer: Medicare Other

## 2018-03-06 ENCOUNTER — Encounter (HOSPITAL_COMMUNITY)
Admission: RE | Admit: 2018-03-06 | Discharge: 2018-03-06 | Disposition: A | Discharge status: Home / Self Care | Payer: Medicare Other | Source: Ambulatory Visit | Attending: Cardiology | Admitting: Cardiology

## 2018-03-06 DIAGNOSIS — I10 Essential (primary) hypertension: Secondary | ICD-10-CM | POA: Diagnosis not present

## 2018-03-06 DIAGNOSIS — E119 Type 2 diabetes mellitus without complications: Secondary | ICD-10-CM | POA: Diagnosis not present

## 2018-03-06 DIAGNOSIS — Z955 Presence of coronary angioplasty implant and graft: Secondary | ICD-10-CM

## 2018-03-06 DIAGNOSIS — I252 Old myocardial infarction: Secondary | ICD-10-CM | POA: Diagnosis not present

## 2018-03-06 DIAGNOSIS — Z950 Presence of cardiac pacemaker: Secondary | ICD-10-CM | POA: Diagnosis not present

## 2018-03-06 DIAGNOSIS — E7800 Pure hypercholesterolemia, unspecified: Secondary | ICD-10-CM | POA: Diagnosis not present

## 2018-03-07 NOTE — Progress Notes (Signed)
Reviewed home exercise with pt today.  Pt plans to walk for exercise.  Reviewed THR, pulse, RPE, sign and symptoms, NTG use, and when to call 911 or MD.  Also discussed weather considerations and indoor options.  Pt voiced understanding.

## 2018-03-08 ENCOUNTER — Encounter (HOSPITAL_COMMUNITY): Payer: Medicare Other

## 2018-03-08 ENCOUNTER — Encounter (HOSPITAL_COMMUNITY)
Admission: RE | Admit: 2018-03-08 | Discharge: 2018-03-08 | Disposition: A | Discharge status: Home / Self Care | Payer: Medicare Other | Source: Ambulatory Visit | Attending: Cardiology | Admitting: Cardiology

## 2018-03-08 DIAGNOSIS — I252 Old myocardial infarction: Secondary | ICD-10-CM | POA: Diagnosis not present

## 2018-03-08 DIAGNOSIS — I10 Essential (primary) hypertension: Secondary | ICD-10-CM | POA: Diagnosis not present

## 2018-03-08 DIAGNOSIS — Z950 Presence of cardiac pacemaker: Secondary | ICD-10-CM | POA: Diagnosis not present

## 2018-03-08 DIAGNOSIS — Z955 Presence of coronary angioplasty implant and graft: Secondary | ICD-10-CM

## 2018-03-08 DIAGNOSIS — E7800 Pure hypercholesterolemia, unspecified: Secondary | ICD-10-CM | POA: Diagnosis not present

## 2018-03-08 DIAGNOSIS — E119 Type 2 diabetes mellitus without complications: Secondary | ICD-10-CM | POA: Diagnosis not present

## 2018-03-10 ENCOUNTER — Encounter (HOSPITAL_COMMUNITY)
Admission: RE | Admit: 2018-03-10 | Discharge: 2018-03-10 | Disposition: A | Discharge status: Home / Self Care | Payer: Medicare Other | Source: Ambulatory Visit | Attending: Cardiology | Admitting: Cardiology

## 2018-03-10 ENCOUNTER — Encounter (HOSPITAL_COMMUNITY): Payer: Medicare Other

## 2018-03-10 DIAGNOSIS — I252 Old myocardial infarction: Secondary | ICD-10-CM | POA: Diagnosis not present

## 2018-03-10 DIAGNOSIS — Z950 Presence of cardiac pacemaker: Secondary | ICD-10-CM | POA: Diagnosis not present

## 2018-03-10 DIAGNOSIS — Z955 Presence of coronary angioplasty implant and graft: Secondary | ICD-10-CM

## 2018-03-10 DIAGNOSIS — E7800 Pure hypercholesterolemia, unspecified: Secondary | ICD-10-CM | POA: Diagnosis not present

## 2018-03-10 DIAGNOSIS — E119 Type 2 diabetes mellitus without complications: Secondary | ICD-10-CM | POA: Diagnosis not present

## 2018-03-10 DIAGNOSIS — I10 Essential (primary) hypertension: Secondary | ICD-10-CM | POA: Diagnosis not present

## 2018-03-13 ENCOUNTER — Encounter (HOSPITAL_COMMUNITY): Payer: Medicare Other

## 2018-03-15 ENCOUNTER — Encounter (HOSPITAL_COMMUNITY): Payer: Medicare Other

## 2018-03-17 ENCOUNTER — Encounter (HOSPITAL_COMMUNITY): Payer: Medicare Other

## 2018-03-20 ENCOUNTER — Encounter (HOSPITAL_COMMUNITY): Payer: Medicare Other

## 2018-03-22 ENCOUNTER — Encounter (HOSPITAL_COMMUNITY): Payer: Medicare Other

## 2018-03-24 ENCOUNTER — Encounter (HOSPITAL_COMMUNITY): Payer: Medicare Other

## 2018-03-29 ENCOUNTER — Encounter (HOSPITAL_COMMUNITY): Payer: Medicare Other

## 2018-03-30 NOTE — Progress Notes (Signed)
Cardiac Individual Treatment Plan  Patient Details  Name: Jesse [name] MRN: [id number] Date of Birth: Sep 05, 1940 Referring Provider:   Flowsheet Row CARDIAC REHAB PHASE II ORIENTATION from 02/14/2018 in [place]  Referring Provider  Adrian Prows MD      Initial Encounter Date:  [place] PHASE II ORIENTATION from 02/14/2018 in [place]  Date  02/14/18      Visit Diagnosis: 11/29/17 Stented coronary artery  Patient's Home Medications on Admission:  Current Outpatient Medications:  .  aspirin 81 MG chewable tablet, Chew 1 tablet (81 mg total) by mouth daily., Disp: 30 tablet, Rfl: 0 .  Biotin 5000 MCG TABS, Take 5,000 mcg by mouth daily., Disp: , Rfl:  .  dorzolamide-timolol (COSOPT) 22.3-6.8 MG/ML ophthalmic solution, Place 1 drop into both eyes 2 (two) times daily., Disp: , Rfl: 10 .  glimepiride (AMARYL) 2 MG tablet, Take 2 mg by mouth 2 (two) times daily., Disp: , Rfl:  .  latanoprost (XALATAN) 0.005 % ophthalmic solution, Place 1 drop into both eyes at bedtime., Disp: , Rfl: 12 .  losartan (COZAAR) 25 MG tablet, Take 25 mg by mouth daily., Disp: , Rfl:  .  metFORMIN (GLUCOPHAGE-XR) 500 MG 24 hr tablet, Take 1 tablet (500 mg total) by mouth daily., Disp: , Rfl: 1 .  metoprolol tartrate (LOPRESSOR) 25 MG tablet, Take 0.5 tablets (12.5 mg total) by mouth 3 (three) times daily. (Patient taking differently: Take 25 mg by mouth 2 (two) times daily. ), Disp: 90 tablet, Rfl: 0 .  nitroGLYCERIN (NITROSTAT) 0.4 MG SL tablet, Place 0.4 mg under the tongue every 5 (five) minutes as needed for chest pain. , Disp: , Rfl: 3 .  Omega-3 Fatty Acids (FISH OIL PO), Take 1 capsule by mouth daily., Disp: , Rfl:  .  rivaroxaban (XARELTO) 2.5 MG TABS tablet, Take 2.5 mg by mouth 2 (two) times daily., Disp: , Rfl:  .  simvastatin (ZOCOR) 40 MG tablet, Take 40 mg by mouth daily., Disp: , Rfl:  .  ticagrelor (BRILINTA) 90 MG  TABS tablet, Take 1 tablet (90 mg total) by mouth 2 (two) times daily., Disp: 60 tablet, Rfl: 3 .  vitamin B-12 (CYANOCOBALAMIN) 1000 MCG tablet, Take 1,000 mcg by mouth daily., Disp: , Rfl:   Past Medical History: Past Medical History:  Diagnosis Date  . Coronary artery disease   . High cholesterol   . Hypertension   . 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  [place] Kitchen Presence of permanent cardiac pacemaker   . Type II diabetes mellitus (HCC)     Tobacco Use: Social History   Tobacco Use  Smoking Status Former Smoker  . Years: 3.00  . Types: Cigarettes  . Last attempt to quit: 1960  . Years since quitting: 59.7  Smokeless Tobacco Never Used    Labs: Recent Chemical engineer    Labs for ITP Cardiac and Pulmonary Rehab Latest Ref Rng & Units 02/05/2015   Cholestrol 0 - 200 mg/dL 108   LDLCALC 0 - 99 mg/dL 42   HDL >40 mg/dL 21(L)   Trlycerides <150 mg/dL 226(H)      Capillary Blood Glucose: Lab Results  Component Value Date   GLUCAP 137 (H) 02/24/2018   GLUCAP 148 (H) 02/22/2018   GLUCAP 106 (H) 02/20/2018   GLUCAP 143 (H) 02/20/2018   GLUCAP 131 (H) 11/30/2017     Exercise Target Goals: Exercise Program Goal: Individual exercise prescription set using results  from initial 6 min walk test and THRR while considering  patient's activity barriers and safety.   Exercise Prescription Goal: Initial exercise prescription builds to 30-45 minutes a day of aerobic activity, 2-3 days per week.  Home exercise guidelines will be given to patient during program as part of exercise prescription that the participant will acknowledge.  Activity Barriers & Risk Stratification: Activity Barriers & Cardiac Risk Stratification - 02/14/18 0808    Activity Barriers & Cardiac Risk Stratification          Activity Barriers  Deconditioning;Muscular Weakness;Shortness of Breath    Cardiac Risk Stratification  High           6 Minute Walk: 6 Minute Walk    6 Minute Walk    Row Name  02/14/18 1020 02/14/18 1050   Phase  Initial  no documentation   Distance  1441 feet  no documentation   Walk Time  6 minutes  no documentation   # of Rest Breaks  0  no documentation   MPH  2.7  no documentation   METS  2.8  no documentation   RPE  11  no documentation   VO2 Peak  9.8  no documentation   Symptoms  No  no documentation   Resting HR  82 bpm  no documentation   Resting BP  104/64  no documentation   Resting Oxygen Saturation   98 %  no documentation   Exercise Oxygen Saturation  during 6 min walk  97 %  no documentation   Max Ex. HR  96 bpm  no documentation   Max Ex. BP  129/70  120/70   2 Minute Post BP  118/60  no documentation          Oxygen Initial Assessment:   Oxygen Re-Evaluation:   Oxygen Discharge (Final Oxygen Re-Evaluation):   Initial Exercise Prescription: Initial Exercise Prescription - 02/14/18 1000    Date of Initial Exercise RX and Referring Provider          Date  02/14/18    Referring Provider  Adrian Prows MD        Treadmill          MPH  2.2    Grade  1    Minutes  10    METs  2.99        Bike          Level  0.5    Minutes  10    METs  2.05        NuStep          Level  3    SPM  80    Minutes  10    METs  2        Prescription Details          Frequency (times per week)  3    Duration  Progress to 30 minutes of continuous aerobic without signs/symptoms of physical distress        Intensity          THRR 40-80% of Max Heartrate  57-114    Ratings of Perceived Exertion  11-13    Perceived Dyspnea  0-4        Progression          Progression  Continue to progress workloads to maintain intensity without signs/symptoms of physical distress.        Art gallery manager  Prescription  Yes    Weight  2lbs    Reps  10-15           Perform Capillary Blood Glucose checks as needed.  Exercise Prescription Changes: Exercise Prescription Changes    Response to Exercise    Row Name  02/20/18 1101 02/24/18 1100 03/08/18 1100   Blood Pressure (Admit)  122/60  110/70  114/64   Blood Pressure (Exercise)  148/80  158/84  142/78   Blood Pressure (Exit)  128/70  104/64  100/66   Heart Rate (Admit)  65 bpm  68 bpm  98 bpm   Heart Rate (Exercise)  119 bpm  116 bpm  113 bpm   Heart Rate (Exit)  78 bpm  83 bpm  93 bpm   Rating of Perceived Exertion (Exercise)  11  12  12    Symptoms  none  none  none   Comments  pt was oriented to exercise equipment today  no documentation  no documentation   Duration  Continue with 30 min of aerobic exercise without signs/symptoms of physical distress.  Continue with 30 min of aerobic exercise without signs/symptoms of physical distress.  Continue with 30 min of aerobic exercise without signs/symptoms of physical distress.   Intensity  THRR unchanged  THRR unchanged  THRR unchanged       Progression    Row Name 02/20/18 1101 02/24/18 1100 03/08/18 1100   Progression  Continue to progress workloads to maintain intensity without signs/symptoms of physical distress.  Continue to progress workloads to maintain intensity without signs/symptoms of physical distress.  Continue to progress workloads to maintain intensity without signs/symptoms of physical distress.   Average METs  2.4  2.9  3.8       Resistance Training    Row Name 02/20/18 1101 02/24/18 1100 03/08/18 1100   Training Prescription  Yes  Yes  No relaxation day   Weight  2lbs  2lbs  no documentation   Reps  10-15  10-15  no documentation   Time  10 Minutes  [place] Name 02/20/18 1101 02/24/18 1100 03/08/18 1100   Level  0.5  0.5  1.4   Minutes  10  10  10    METs  2.06  2.06  3.94       NuStep    Row Name 02/20/18 1101 02/24/18 1100 03/08/18 1100   Level  3  3  4    SPM  80  90  90   Minutes  10  10  10    METs  2.6  2.9  3.8       Track    Row Name 02/20/18 1101 02/24/18 1100 03/08/18 1100   Laps  8  16  16    Minutes  10  10  10    METs   2.39  3.79  3.79       Home Exercise Plan    [place] Name 02/20/18 1101 02/24/18 1100 03/08/18 1100   Plans to continue exercise at  no documentation  no documentation  Home (comment) walking   Frequency  no documentation  no documentation  Add 2 additional days to program exercise sessions.   Initial Home Exercises Provided  no documentation  no documentation  02/27/18          Exercise Comments: Exercise Comments    Row Name 02/20/18 1109   Exercise Comments  Pt completed first  exercise session without signs and symptoms of physical distress. Rehab team will continue to monitor activity levels and progress as tolerated.       Exercise Goals and Review: Exercise Goals    Exercise Goals    Row Name 02/14/18 0808   Increase Physical Activity  Yes   Intervention  Provide advice, education, support and counseling about physical activity/exercise needs.;Develop an individualized exercise prescription for aerobic and resistive training based on initial evaluation findings, risk stratification, comorbidities and participant's personal goals.   Expected Outcomes  Short Term: Attend rehab on a regular basis to increase amount of physical activity.;Long Term: Add in home exercise to make exercise part of routine and to increase amount of physical activity.;Long Term: Exercising regularly at least 3-5 days a week.   Increase Strength and Stamina  Yes   Intervention  Provide advice, education, support and counseling about physical activity/exercise needs.;Develop an individualized exercise prescription for aerobic and resistive training based on initial evaluation findings, risk stratification, comorbidities and participant's personal goals.   Expected Outcomes  Short Term: Increase workloads from initial exercise prescription for resistance, speed, and METs.;Short Term: Perform resistance training exercises routinely during rehab and add in resistance training at home;Long Term: Improve  cardiorespiratory fitness, muscular endurance and strength as measured by increased METs and functional capacity (6MWT)   Able to understand and use rate of perceived exertion (RPE) scale  Yes   Intervention  Provide education and explanation on how to use RPE scale   Expected Outcomes  Short Term: Able to use RPE daily in rehab to express subjective intensity level;Long Term:  Able to use RPE to guide intensity level when exercising independently   Knowledge and understanding of Target Heart Rate Range (THRR)  Yes   Intervention  Provide education and explanation of THRR including how the numbers were predicted and where they are located for reference   Expected Outcomes  Short Term: Able to state/look up THRR;Long Term: Able to use THRR to govern intensity when exercising independently;Short Term: Able to use daily as guideline for intensity in rehab   Able to check pulse independently  Yes   Intervention  Provide education and demonstration on how to check pulse in carotid and radial arteries.;Review the importance of being able to check your own pulse for safety during independent exercise   Expected Outcomes  Short Term: Able to explain why pulse checking is important during independent exercise;Long Term: Able to check pulse independently and accurately   Understanding of Exercise Prescription  Yes   Intervention  Provide education, explanation, and written materials on patient's individual exercise prescription   Expected Outcomes  Short Term: Able to explain program exercise prescription;Long Term: Able to explain home exercise prescription to exercise independently          Exercise Goals Re-Evaluation : Exercise Goals Re-Evaluation    Exercise Goal Re-Evaluation    Row Name 02/24/18 1107   Exercise Goals Review  Increase Physical Activity;Increase Strength and Stamina;Understanding of Exercise Prescription;Able to understand and use rate of perceived exertion (RPE) scale   Comments   Pt is able to exercise for 30 minutes without signs/symptoms of physical distress and has a great understanding of exercise program and RPE scale.    Expected Outcomes  Pt will improve in cardiorespiratory fitness and overall functional capacity.          Discharge Exercise Prescription (Final Exercise Prescription Changes): Exercise Prescription Changes - 03/08/18 1100    Response to Exercise  Blood Pressure (Admit)  114/64    Blood Pressure (Exercise)  142/78    Blood Pressure (Exit)  100/66    Heart Rate (Admit)  98 bpm    Heart Rate (Exercise)  113 bpm    Heart Rate (Exit)  93 bpm    Rating of Perceived Exertion (Exercise)  12    Symptoms  none    Duration  Continue with 30 min of aerobic exercise without signs/symptoms of physical distress.    Intensity  THRR unchanged        Progression          Progression  Continue to progress workloads to maintain intensity without signs/symptoms of physical distress.    Average METs  3.8        Resistance Training          Training Prescription  No   relaxation day   Time  10 Minutes        Bike          Level  1.4    Minutes  10    METs  3.94        NuStep          Level  4    SPM  90    Minutes  10    METs  3.8        Track          Laps  16    Minutes  10    METs  3.79        Home Exercise Plan          Plans to continue exercise at  Home (comment)   walking   Frequency  Add 2 additional days to program exercise sessions.    Initial Home Exercises Provided  02/27/18           Nutrition:  Target Goals: Understanding of nutrition guidelines, daily intake of sodium 1500mg , cholesterol 200mg , calories 30% from fat and 7% or less from saturated fats, daily to have 5 or more servings of fruits and vegetables.  Biometrics: Pre Biometrics - 02/14/18 1048    Pre Biometrics          Height  6\' 1"  (1.854 m)    Weight  89.3 kg    Waist Circumference  39 inches    Hip Circumference  40 inches     Waist to Hip Ratio  0.98 %    BMI (Calculated)  25.98    Triceps Skinfold  26 mm    % Body Fat  28.6 %    Grip Strength  37 kg    Flexibility  11 in    Single Leg Stand  0 seconds            Nutrition Therapy Plan and Nutrition Goals: Nutrition Therapy & Goals - 02/14/18 1047    Nutrition Therapy          Diet  consistent carbohydrate heart healthy        Personal Nutrition Goals          Nutrition Goal  Pt to identify and limit food sources of saturated fat, trans fat, and sodium        Intervention Plan          Intervention  Prescribe, educate and counsel regarding individualized specific dietary modifications aiming towards targeted core components such as weight, hypertension, lipid management, diabetes, heart failure and other comorbidities.    Expected Outcomes  Short  Term Goal: Understand basic principles of dietary content, such as calories, fat, sodium, cholesterol and nutrients.           Nutrition Assessments: Nutrition Assessments - 02/14/18 1047    MEDFICTS Scores          Pre Score  46           Nutrition Goals Re-Evaluation:   Nutrition Goals Re-Evaluation:   Nutrition Goals Discharge (Final Nutrition Goals Re-Evaluation):   Psychosocial: Target Goals: Acknowledge presence or absence of significant depression and/or stress, maximize coping skills, provide positive support system. Participant is able to verbalize types and ability to use techniques and skills needed for reducing stress and depression.  Initial Review & Psychosocial Screening: Initial Psych Review & Screening - 02/21/18 1606    Initial Review          Current issues with  None Identified        Family Dynamics          Good Support System?  Yes   Raffaela Martino       Barriers          Psychosocial barriers to participate in program  There are no identifiable barriers or psychosocial needs.        Screening Interventions          Interventions  Encouraged  to exercise           Quality of Life Scores: Quality of Life - 02/14/18 1053    Quality of Life          Select  Quality of Life        Quality of Life Scores          Health/Function Pre  28.4 %    Socioeconomic Pre  30 %    Psych/Spiritual Pre  30 %    Family Pre  30 %    GLOBAL Pre  29.25 %          Scores of 19 and below usually indicate a poorer quality of life in these areas.  A difference of  2-3 points is a clinically meaningful difference.  A difference of 2-3 points in the total score of the Quality of Life Index has been associated with significant improvement in overall quality of life, self-image, physical symptoms, and general health in studies assessing change in quality of life.  PHQ-9: Recent Review Flowsheet Data    Depression screen [facility] 2/9 02/21/2018   Decreased Interest 0   Down, Depressed, Hopeless 0   PHQ - 2 Score 0     Interpretation of Total Score  Total Score Depression Severity:  1-4 = Minimal depression, 5-9 = Mild depression, 10-14 = Moderate depression, 15-19 = Moderately severe depression, 20-27 = Severe depression   Psychosocial Evaluation and Intervention: Psychosocial Evaluation - 02/21/18 1610    Psychosocial Evaluation & Interventions          Interventions  Encouraged to exercise with the program and follow exercise prescription    Comments  no psychosocial needs identified, no interventions necessary. pt enjoys watching TV, gardening, walking and using his computer.      Expected Outcomes  pt will exhibit positive outlook with good coping skills.     Continue Psychosocial Services   No Follow up required           Psychosocial Re-Evaluation: Psychosocial Re-Evaluation    Psychosocial Re-Evaluation    [place] Name 02/24/18 1221   Current issues with  None Identified  Comments  no psychosocial needs identified, no interventions necessary    Expected Outcomes  pt will exhibit positive outlook with good coping skills.     Interventions  Encouraged to attend Cardiac Rehabilitation for the exercise   Continue Psychosocial Services   No Follow up required          Psychosocial Discharge (Final Psychosocial Re-Evaluation): Psychosocial Re-Evaluation - 02/24/18 1221    Psychosocial Re-Evaluation          Current issues with  None Identified    Comments  no psychosocial needs identified, no interventions necessary     Expected Outcomes  pt will exhibit positive outlook with good coping skills.     Interventions  Encouraged to attend Cardiac Rehabilitation for the exercise    Continue Psychosocial Services   No Follow up required           Vocational Rehabilitation: Provide vocational rehab assistance to qualifying candidates.   Vocational Rehab Evaluation & Intervention: Vocational Rehab - 02/21/18 1606    Initial Vocational Rehab Evaluation & Intervention          Assessment shows need for Vocational Rehabilitation  No   retired Government social research officer          Education: Education Goals: Education classes will be provided on a weekly basis, covering required topics. Participant will state understanding/return demonstration of topics presented.  Learning Barriers/Preferences: Learning Barriers/Preferences - 02/14/18 9417    Learning Barriers/Preferences          Learning Barriers  Sight    Learning Preferences  Pictoral;Verbal Instruction;Video;Skilled Demonstration;Audio;Written Material           Education Topics: Count Your Pulse:  -Group instruction provided by verbal instruction, demonstration, patient participation and written materials to support subject.  Instructors address importance of being able to find your pulse and how to count your pulse when at home without a heart monitor.  Patients get hands on experience counting their pulse with staff help and individually. Flowsheet Row CARDIAC REHAB PHASE II EXERCISE from 03/10/2018 in [place]  Date   03/03/18  Educator  RN  Instruction Review Code  2- Demonstrated Understanding      Heart Attack, Angina, and Risk Factor Modification:  -Group instruction provided by verbal instruction, video, and written materials to support subject.  Instructors address signs and symptoms of angina and heart attacks.    Also discuss risk factors for heart disease and how to make changes to improve heart health risk factors. Flowsheet Row CARDIAC REHAB PHASE II EXERCISE from 03/10/2018 in [place]  Date  03/01/18  Educator  RN  Instruction Review Code  2- Demonstrated Understanding      Functional Fitness:  -Group instruction provided by verbal instruction, demonstration, patient participation, and written materials to support subject.  Instructors address safety measures for doing things around the house.  Discuss how to get up and down off the floor, how to pick things up properly, how to safely get out of a chair without assistance, and balance training. Flowsheet Row CARDIAC REHAB PHASE II EXERCISE from 03/10/2018 in [place]  Date  03/10/18  Instruction Review Code  2- Demonstrated Understanding      Meditation and Mindfulness:  -Group instruction provided by verbal instruction, patient participation, and written materials to support subject.  Instructor addresses importance of mindfulness and meditation practice to help reduce stress and improve awareness.  Instructor also leads  participants through a meditation exercise.    Stretching for Flexibility and Mobility:  -Group instruction provided by verbal instruction, patient participation, and written materials to support subject.  Instructors lead participants through series of stretches that are designed to increase flexibility thus improving mobility.  These stretches are additional exercise for major muscle groups that are typically performed during regular warm up and cool  down.   Hands Only CPR:  -Group verbal, video, and participation provides a basic overview of AHA guidelines for community CPR. Role-play of emergencies allow participants the opportunity to practice calling for help and chest compression technique with discussion of AED use.   Hypertension: -Group verbal and written instruction that provides a basic overview of hypertension including the most recent diagnostic guidelines, risk factor reduction with self-care instructions and medication management.    Nutrition I class: Heart Healthy Eating:  -Group instruction provided by PowerPoint slides, verbal discussion, and written materials to support subject matter. The instructor gives an explanation and review of the Therapeutic Lifestyle Changes diet recommendations, which includes a discussion on lipid goals, dietary fat, sodium, fiber, plant stanol/sterol esters, sugar, and the components of a well-balanced, healthy diet.   Nutrition II class: Lifestyle Skills:  -Group instruction provided by PowerPoint slides, verbal discussion, and written materials to support subject matter. The instructor gives an explanation and review of label reading, grocery shopping for heart health, heart healthy recipe modifications, and ways to make healthier choices when eating out.   Diabetes Question & Answer:  -Group instruction provided by PowerPoint slides, verbal discussion, and written materials to support subject matter. The instructor gives an explanation and review of diabetes co-morbidities, pre- and post-prandial blood glucose goals, pre-exercise blood glucose goals, signs, symptoms, and treatment of hypoglycemia and hyperglycemia, and foot care basics.   Diabetes Blitz:  -Group instruction provided by PowerPoint slides, verbal discussion, and written materials to support subject matter. The instructor gives an explanation and review of the physiology behind type 1 and type 2 diabetes, diabetes  medications and rational behind using different medications, pre- and post-prandial blood glucose recommendations and Hemoglobin A1c goals, diabetes diet, and exercise including blood glucose guidelines for exercising safely.    Portion Distortion:  -Group instruction provided by PowerPoint slides, verbal discussion, written materials, and food models to support subject matter. The instructor gives an explanation of serving size versus portion size, changes in portions sizes over the last 20 years, and what consists of a serving from each food group.   Stress Management:  -Group instruction provided by verbal instruction, video, and written materials to support subject matter.  Instructors review role of stress in heart disease and how to cope with stress positively.     Exercising on Your Own:  -Group instruction provided by verbal instruction, power point, and written materials to support subject.  Instructors discuss benefits of exercise, components of exercise, frequency and intensity of exercise, and end points for exercise.  Also discuss use of nitroglycerin and activating EMS.  Review options of places to exercise outside of rehab.  Review guidelines for sex with heart disease.   Cardiac Drugs I:  -Group instruction provided by verbal instruction and written materials to support subject.  Instructor reviews cardiac drug classes: antiplatelets, anticoagulants, beta blockers, and statins.  Instructor discusses reasons, side effects, and lifestyle considerations for each drug class.   Cardiac Drugs II:  -Group instruction provided by verbal instruction and written materials to support subject.  Instructor reviews cardiac drug classes: angiotensin converting enzyme  inhibitors (ACE-I), angiotensin II receptor blockers (ARBs), nitrates, and calcium channel blockers.  Instructor discusses reasons, side effects, and lifestyle considerations for each drug class. Flowsheet Row CARDIAC REHAB PHASE  II EXERCISE from 03/10/2018 in [place]  Date  03/08/18  Instruction Review Code  2- Demonstrated Understanding      Anatomy and Physiology of the Circulatory System:  Group verbal and written instruction and models provide basic cardiac anatomy and physiology, with the coronary electrical and arterial systems. Review of: AMI, Angina, Valve disease, Heart Failure, Peripheral Artery Disease, Cardiac Arrhythmia, Pacemakers, and the ICD. Flowsheet Row CARDIAC REHAB PHASE II EXERCISE from 03/10/2018 in [place]  Date  02/22/18  Educator  RN  Instruction Review Code  2- Demonstrated Understanding      Other Education:  -Group or individual verbal, written, or video instructions that support the educational goals of the cardiac rehab program.   Holiday Eating Survival Tips:  -Group instruction provided by PowerPoint slides, verbal discussion, and written materials to support subject matter. The instructor gives patients tips, tricks, and techniques to help them not only survive but enjoy the holidays despite the onslaught of food that accompanies the holidays.   Knowledge Questionnaire Score: Knowledge Questionnaire Score - 02/14/18 1019    Knowledge Questionnaire Score          Pre Score  23/24           Core Components/Risk Factors/Patient Goals at Admission: Personal Goals and Risk Factors at Admission - 02/14/18 1049    Core Components/Risk Factors/Patient Goals on Admission          Diabetes  Yes    Intervention  Provide education about signs/symptoms and action to take for hypo/hyperglycemia.;Provide education about proper nutrition, including hydration, and aerobic/resistive exercise prescription along with prescribed medications to achieve blood glucose in normal ranges: Fasting glucose 65-99 mg/dL    Expected Outcomes  Short Term: Participant verbalizes understanding of the signs/symptoms and immediate care of  hyper/hypoglycemia, proper foot care and importance of medication, aerobic/resistive exercise and nutrition plan for blood glucose control.;Long Term: Attainment of HbA1C < 7%.    Hypertension  Yes    Intervention  Provide education on lifestyle modifcations including regular physical activity/exercise, weight management, moderate sodium restriction and increased consumption of fresh fruit, vegetables, and low fat dairy, alcohol moderation, and smoking cessation.;Monitor prescription use compliance.    Expected Outcomes  Short Term: Continued assessment and intervention until BP is < 140/21mm HG in hypertensive participants. < 130/78mm HG in hypertensive participants with diabetes, heart failure or chronic kidney disease.;Long Term: Maintenance of blood pressure at goal levels.    Lipids  Yes    Intervention  Provide education and support for participant on nutrition & aerobic/resistive exercise along with prescribed medications to achieve LDL 70mg , HDL >40mg .    Expected Outcomes  Short Term: Participant states understanding of desired cholesterol values and is compliant with medications prescribed. Participant is following exercise prescription and nutrition guidelines.;Long Term: Cholesterol controlled with medications as prescribed, with individualized exercise RX and with personalized nutrition plan. Value goals: LDL < 70mg , HDL > 40 mg.           Core Components/Risk Factors/Patient Goals Review:  Goals and Risk Factor Review    Core Components/Risk Factors/Patient Goals Review    Row Name 02/21/18 0807 02/24/18 1219   Personal Goals Review  Diabetes;Hypertension;Lipids  Diabetes;Hypertension;Lipids   Review  pt with multiple CAD RF demonstrates  willingness to participate in CR program.  pt demonstrates excellent DM self management. pt checks CBG TID for good and verbalizes desire to maintain good ranges. pt personal goals are to attend cardiac rehab and improve health  pt with multiple CAD  RF demonstrates willingness to participate in CR program.  pt demonstrates excellent DM self management with good exercise CBG  trends.  pt personal goals are to attend cardiac rehab and improve health   Expected Outcomes  pt will participate in CR exercise, nutrition and lifestyle modification to decrease overall RF.   pt will participate in CR exercise, nutrition and lifestyle modification to decrease overall RF.           Core Components/Risk Factors/Patient Goals at Discharge (Final Review):  Goals and Risk Factor Review - 02/24/18 1219    Core Components/Risk Factors/Patient Goals Review          Personal Goals Review  Diabetes;Hypertension;Lipids    Review  pt with multiple CAD RF demonstrates willingness to participate in CR program.  pt demonstrates excellent DM self management with good exercise CBG  trends.  pt personal goals are to attend cardiac rehab and improve health    Expected Outcomes  pt will participate in CR exercise, nutrition and lifestyle modification to decrease overall RF.            ITP Comments: ITP Comments    Row Name 02/14/18 0807 02/20/18 0804 02/24/18 1219 03/30/18 1713   ITP Comments  Dr. Fransico Him, Medical Director  pt started group exercise. pt tolerated light activity without difficulty. pt oriented to exercise equipment and safety routine.   30 day ITP review. pt demonstrates eagerness to participate in CR program.   30 day ITP review. pt demonstrates eagerness to participate in CR program. pt recent absences due to scheduled travel      Comments:

## 2018-03-31 ENCOUNTER — Encounter (HOSPITAL_COMMUNITY): Payer: Medicare Other

## 2018-04-03 ENCOUNTER — Encounter (HOSPITAL_COMMUNITY): Payer: Medicare Other

## 2018-04-05 ENCOUNTER — Encounter (HOSPITAL_COMMUNITY): Payer: Medicare Other

## 2018-04-07 ENCOUNTER — Encounter (HOSPITAL_COMMUNITY): Payer: Medicare Other

## 2018-04-10 ENCOUNTER — Encounter (HOSPITAL_COMMUNITY): Payer: Medicare Other

## 2018-04-10 ENCOUNTER — Telehealth (HOSPITAL_COMMUNITY): Payer: Self-pay | Admitting: Cardiac Rehabilitation

## 2018-04-10 NOTE — Telephone Encounter (Signed)
pc to assess reason for continued absence from cardiac rehab. [place] been [place]. Pt has returned home, however will need to be away again in a few weeks. Pt plans to return to rehab on next scheduled appt.  Pt would like to continue CR program.   Andi Hence, RN, BSN  Cardiac Pulmonary Rehab

## 2018-04-12 ENCOUNTER — Encounter (HOSPITAL_COMMUNITY)
Admission: RE | Admit: 2018-04-12 | Discharge: 2018-04-12 | Disposition: A | Discharge status: Home / Self Care | Payer: Medicare Other | Source: Ambulatory Visit | Attending: Cardiology | Admitting: Cardiology

## 2018-04-12 ENCOUNTER — Encounter (HOSPITAL_COMMUNITY): Payer: Medicare Other

## 2018-04-12 DIAGNOSIS — Z955 Presence of coronary angioplasty implant and graft: Secondary | ICD-10-CM | POA: Diagnosis not present

## 2018-04-12 DIAGNOSIS — I2510 Atherosclerotic heart disease of native coronary artery without angina pectoris: Secondary | ICD-10-CM | POA: Insufficient documentation

## 2018-04-12 DIAGNOSIS — Z950 Presence of cardiac pacemaker: Secondary | ICD-10-CM | POA: Insufficient documentation

## 2018-04-12 DIAGNOSIS — Z79899 Other long term (current) drug therapy: Secondary | ICD-10-CM | POA: Insufficient documentation

## 2018-04-12 DIAGNOSIS — Z7984 Long term (current) use of oral hypoglycemic drugs: Secondary | ICD-10-CM | POA: Diagnosis not present

## 2018-04-12 DIAGNOSIS — I10 Essential (primary) hypertension: Secondary | ICD-10-CM | POA: Insufficient documentation

## 2018-04-12 DIAGNOSIS — Z7901 Long term (current) use of anticoagulants: Secondary | ICD-10-CM | POA: Insufficient documentation

## 2018-04-12 DIAGNOSIS — Z7982 Long term (current) use of aspirin: Secondary | ICD-10-CM | POA: Insufficient documentation

## 2018-04-12 DIAGNOSIS — E7800 Pure hypercholesterolemia, unspecified: Secondary | ICD-10-CM | POA: Diagnosis not present

## 2018-04-12 DIAGNOSIS — I252 Old myocardial infarction: Secondary | ICD-10-CM | POA: Diagnosis not present

## 2018-04-12 DIAGNOSIS — E119 Type 2 diabetes mellitus without complications: Secondary | ICD-10-CM | POA: Insufficient documentation

## 2018-04-14 ENCOUNTER — Encounter (HOSPITAL_COMMUNITY)
Admission: RE | Admit: 2018-04-14 | Discharge: 2018-04-14 | Disposition: A | Discharge status: Home / Self Care | Payer: Medicare Other | Source: Ambulatory Visit | Attending: Cardiology | Admitting: Cardiology

## 2018-04-14 ENCOUNTER — Encounter (HOSPITAL_COMMUNITY): Payer: Medicare Other

## 2018-04-14 DIAGNOSIS — Z950 Presence of cardiac pacemaker: Secondary | ICD-10-CM | POA: Diagnosis not present

## 2018-04-14 DIAGNOSIS — E7800 Pure hypercholesterolemia, unspecified: Secondary | ICD-10-CM | POA: Diagnosis not present

## 2018-04-14 DIAGNOSIS — I252 Old myocardial infarction: Secondary | ICD-10-CM | POA: Diagnosis not present

## 2018-04-14 DIAGNOSIS — Z955 Presence of coronary angioplasty implant and graft: Secondary | ICD-10-CM | POA: Diagnosis not present

## 2018-04-14 DIAGNOSIS — E119 Type 2 diabetes mellitus without complications: Secondary | ICD-10-CM | POA: Diagnosis not present

## 2018-04-14 DIAGNOSIS — I10 Essential (primary) hypertension: Secondary | ICD-10-CM | POA: Diagnosis not present

## 2018-04-17 ENCOUNTER — Encounter (HOSPITAL_COMMUNITY): Payer: Medicare Other

## 2018-04-19 ENCOUNTER — Encounter (HOSPITAL_COMMUNITY): Payer: Medicare Other

## 2018-04-21 ENCOUNTER — Encounter (HOSPITAL_COMMUNITY): Payer: Medicare Other

## 2018-04-24 ENCOUNTER — Encounter (HOSPITAL_COMMUNITY): Payer: Medicare Other

## 2018-04-24 ENCOUNTER — Encounter (HOSPITAL_COMMUNITY)
Admission: RE | Admit: 2018-04-24 | Discharge: 2018-04-24 | Disposition: A | Discharge status: Home / Self Care | Payer: Medicare Other | Source: Ambulatory Visit | Attending: Cardiology | Admitting: Cardiology

## 2018-04-24 DIAGNOSIS — I252 Old myocardial infarction: Secondary | ICD-10-CM | POA: Diagnosis not present

## 2018-04-24 DIAGNOSIS — I10 Essential (primary) hypertension: Secondary | ICD-10-CM | POA: Diagnosis not present

## 2018-04-24 DIAGNOSIS — Z955 Presence of coronary angioplasty implant and graft: Secondary | ICD-10-CM | POA: Diagnosis not present

## 2018-04-24 DIAGNOSIS — E7800 Pure hypercholesterolemia, unspecified: Secondary | ICD-10-CM | POA: Diagnosis not present

## 2018-04-24 DIAGNOSIS — Z950 Presence of cardiac pacemaker: Secondary | ICD-10-CM | POA: Diagnosis not present

## 2018-04-24 DIAGNOSIS — E119 Type 2 diabetes mellitus without complications: Secondary | ICD-10-CM | POA: Diagnosis not present

## 2018-04-26 ENCOUNTER — Encounter (HOSPITAL_COMMUNITY)
Admission: RE | Admit: 2018-04-26 | Discharge: 2018-04-26 | Disposition: A | Discharge status: Home / Self Care | Payer: Medicare Other | Source: Ambulatory Visit | Attending: Cardiology | Admitting: Cardiology

## 2018-04-26 ENCOUNTER — Encounter (HOSPITAL_COMMUNITY): Payer: Medicare Other

## 2018-04-26 DIAGNOSIS — Z955 Presence of coronary angioplasty implant and graft: Secondary | ICD-10-CM | POA: Insufficient documentation

## 2018-04-26 DIAGNOSIS — E119 Type 2 diabetes mellitus without complications: Secondary | ICD-10-CM | POA: Insufficient documentation

## 2018-04-26 DIAGNOSIS — Z7982 Long term (current) use of aspirin: Secondary | ICD-10-CM | POA: Insufficient documentation

## 2018-04-26 DIAGNOSIS — Z79899 Other long term (current) drug therapy: Secondary | ICD-10-CM | POA: Insufficient documentation

## 2018-04-26 DIAGNOSIS — Z7901 Long term (current) use of anticoagulants: Secondary | ICD-10-CM | POA: Diagnosis not present

## 2018-04-26 DIAGNOSIS — I252 Old myocardial infarction: Secondary | ICD-10-CM | POA: Diagnosis not present

## 2018-04-26 DIAGNOSIS — I2510 Atherosclerotic heart disease of native coronary artery without angina pectoris: Secondary | ICD-10-CM | POA: Insufficient documentation

## 2018-04-26 DIAGNOSIS — E7800 Pure hypercholesterolemia, unspecified: Secondary | ICD-10-CM | POA: Insufficient documentation

## 2018-04-26 DIAGNOSIS — I10 Essential (primary) hypertension: Secondary | ICD-10-CM | POA: Diagnosis not present

## 2018-04-26 DIAGNOSIS — Z7984 Long term (current) use of oral hypoglycemic drugs: Secondary | ICD-10-CM | POA: Diagnosis not present

## 2018-04-26 DIAGNOSIS — Z950 Presence of cardiac pacemaker: Secondary | ICD-10-CM | POA: Diagnosis not present

## 2018-04-26 LAB — GLUCOSE, CAPILLARY: GLUCOSE-CAPILLARY: 119 mg/dL — AB (ref 70–99)

## 2018-04-27 NOTE — Progress Notes (Signed)
Cardiac Individual Treatment Plan  Patient Details  Name: Jesse [name] MRN: [id number] Date of Birth: [birth year] Referring Provider:   Flowsheet Row CARDIAC REHAB PHASE II ORIENTATION from 02/14/2018 in [place]  Referring Provider  Adrian Prows MD      Initial Encounter Date:  [place] PHASE II ORIENTATION from 02/14/2018 in [place]  Date  02/14/18      Visit Diagnosis: 11/29/17 Stented coronary artery  Patient's Home Medications on Admission:  Current Outpatient Medications:  .  aspirin 81 MG chewable tablet, Chew 1 tablet (81 mg total) by mouth daily., Disp: 30 tablet, Rfl: 0 .  Biotin 5000 MCG TABS, Take 5,000 mcg by mouth daily., Disp: , Rfl:  .  dorzolamide-timolol (COSOPT) 22.3-6.8 MG/ML ophthalmic solution, Place 1 drop into both eyes 2 (two) times daily., Disp: , Rfl: 10 .  glimepiride (AMARYL) 2 MG tablet, Take 2 mg by mouth 2 (two) times daily., Disp: , Rfl:  .  latanoprost (XALATAN) 0.005 % ophthalmic solution, Place 1 drop into both eyes at bedtime., Disp: , Rfl: 12 .  losartan (COZAAR) 25 MG tablet, Take 25 mg by mouth daily., Disp: , Rfl:  .  metFORMIN (GLUCOPHAGE-XR) 500 MG 24 hr tablet, Take 1 tablet (500 mg total) by mouth daily., Disp: , Rfl: 1 .  metoprolol tartrate (LOPRESSOR) 25 MG tablet, Take 0.5 tablets (12.5 mg total) by mouth 3 (three) times daily. (Patient taking differently: Take 25 mg by mouth 2 (two) times daily. ), Disp: 90 tablet, Rfl: 0 .  nitroGLYCERIN (NITROSTAT) 0.4 MG SL tablet, Place 0.4 mg under the tongue every 5 (five) minutes as needed for chest pain. , Disp: , Rfl: 3 .  Omega-3 Fatty Acids (FISH OIL PO), Take 1 capsule by mouth daily., Disp: , Rfl:  .  rivaroxaban (XARELTO) 2.5 MG TABS tablet, Take 2.5 mg by mouth 2 (two) times daily., Disp: , Rfl:  .  simvastatin (ZOCOR) 40 MG tablet, Take 40 mg by mouth daily., Disp: , Rfl:  .  ticagrelor (BRILINTA) 90 MG  TABS tablet, Take 1 tablet (90 mg total) by mouth 2 (two) times daily., Disp: 60 tablet, Rfl: 3 .  vitamin B-12 (CYANOCOBALAMIN) 1000 MCG tablet, Take 1,000 mcg by mouth daily., Disp: , Rfl:   Past Medical History: Past Medical History:  Diagnosis Date  . Coronary artery disease   . High cholesterol   . Hypertension   . 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  [place] Presence of permanent cardiac pacemaker   . Type II diabetes mellitus (HCC)     Tobacco Use: Social History   Tobacco Use  Smoking Status Former Smoker  . Years: 3.00  . Types: Cigarettes  . Last attempt to quit: 1960  . Years since quitting: 59.7  Smokeless Tobacco Never Used    Labs: Recent Chemical engineer    Labs for ITP Cardiac and Pulmonary Rehab Latest Ref Rng & Units 02/05/2015   Cholestrol 0 - 200 mg/dL 108   LDLCALC 0 - 99 mg/dL 42   HDL >40 mg/dL 21(L)   Trlycerides <150 mg/dL 226(H)      Capillary Blood Glucose: Lab Results  Component Value Date   GLUCAP 119 (H) 04/26/2018   GLUCAP 137 (H) 02/24/2018   GLUCAP 148 (H) 02/22/2018   GLUCAP 106 (H) 02/20/2018   GLUCAP 143 (H) 02/20/2018     Exercise Target Goals: Exercise Program Goal: Individual exercise prescription set using results  from initial 6 min walk test and THRR while considering  patient's activity barriers and safety.   Exercise Prescription Goal: Initial exercise prescription builds to 30-45 minutes a day of aerobic activity, 2-3 days per week.  Home exercise guidelines will be given to patient during program as part of exercise prescription that the participant will acknowledge.  Activity Barriers & Risk Stratification: Activity Barriers & Cardiac Risk Stratification - 02/14/18 0808    Activity Barriers & Cardiac Risk Stratification          Activity Barriers  Deconditioning;Muscular Weakness;Shortness of Breath    Cardiac Risk Stratification  High           6 Minute Walk: 6 Minute Walk    6 Minute Walk    Row Name  02/14/18 1020 02/14/18 1050   Phase  Initial  no documentation   Distance  1441 feet  no documentation   Walk Time  6 minutes  no documentation   # of Rest Breaks  0  no documentation   MPH  2.7  no documentation   METS  2.8  no documentation   RPE  11  no documentation   VO2 Peak  9.8  no documentation   Symptoms  No  no documentation   Resting HR  82 bpm  no documentation   Resting BP  104/64  no documentation   Resting Oxygen Saturation   98 %  no documentation   Exercise Oxygen Saturation  during 6 min walk  97 %  no documentation   Max Ex. HR  96 bpm  no documentation   Max Ex. BP  129/70  120/70   2 Minute Post BP  118/60  no documentation          Oxygen Initial Assessment:   Oxygen Re-Evaluation:   Oxygen Discharge (Final Oxygen Re-Evaluation):   Initial Exercise Prescription: Initial Exercise Prescription - 02/14/18 1000    Date of Initial Exercise RX and Referring Provider          Date  02/14/18    Referring Provider  Adrian Prows MD        Treadmill          MPH  2.2    Grade  1    Minutes  10    METs  2.99        Bike          Level  0.5    Minutes  10    METs  2.05        NuStep          Level  3    SPM  80    Minutes  10    METs  2        Prescription Details          Frequency (times per week)  3    Duration  Progress to 30 minutes of continuous aerobic without signs/symptoms of physical distress        Intensity          THRR 40-80% of Max Heartrate  57-114    Ratings of Perceived Exertion  11-13    Perceived Dyspnea  0-4        Progression          Progression  Continue to progress workloads to maintain intensity without signs/symptoms of physical distress.        Art gallery manager  Prescription  Yes    Weight  2lbs    Reps  10-15           Perform Capillary Blood Glucose checks as needed.  Exercise Prescription Changes: Exercise Prescription Changes    Response to Exercise    Row Name  02/20/18 1101 02/24/18 1100 03/08/18 1100 04/12/18 1600 04/26/18 1432   Blood Pressure (Admit)  122/60  110/70  114/64  120/70  148/78   Blood Pressure (Exercise)  148/80  158/84  142/78  144/80  158/82   Blood Pressure (Exit)  128/70  104/64  100/66  118/68  120/80   Heart Rate (Admit)  65 bpm  68 bpm  98 bpm  81 bpm  70 bpm   Heart Rate (Exercise)  119 bpm  116 bpm  113 bpm  112 bpm  131 bpm   Heart Rate (Exit)  78 bpm  83 bpm  93 bpm  75 bpm  79 bpm   Rating of Perceived Exertion (Exercise)  11  12  12  12  11    Perceived Dyspnea (Exercise)  no documentation  no documentation  no documentation  0  0   Symptoms  none  none  none  None  None   Comments  pt was oriented to exercise equipment today  no documentation  no documentation  Pt's first day back in a month  no documentation   Duration  Continue with 30 min of aerobic exercise without signs/symptoms of physical distress.  Continue with 30 min of aerobic exercise without signs/symptoms of physical distress.  Continue with 30 min of aerobic exercise without signs/symptoms of physical distress.  Continue with 30 min of aerobic exercise without signs/symptoms of physical distress.  Continue with 30 min of aerobic exercise without signs/symptoms of physical distress.   Intensity  THRR unchanged  THRR unchanged  THRR unchanged  THRR unchanged  THRR unchanged       Progression    Row Name 02/20/18 1101 02/24/18 1100 03/08/18 1100 04/12/18 1600 04/26/18 1432   Progression  Continue to progress workloads to maintain intensity without signs/symptoms of physical distress.  Continue to progress workloads to maintain intensity without signs/symptoms of physical distress.  Continue to progress workloads to maintain intensity without signs/symptoms of physical distress.  Continue to progress workloads to maintain intensity without signs/symptoms of physical distress.  Continue to progress workloads to maintain intensity without signs/symptoms of physical  distress.   Average METs  2.4  2.9  3.8  3.44  3.58       Resistance Training    Row Name 02/20/18 1101 02/24/18 1100 03/08/18 1100 04/12/18 1600 04/26/18 1432   Training Prescription  Yes  Yes  No relaxation day  No  No   Weight  2lbs  2lbs  no documentation  no documentation  no documentation   Reps  10-15  10-15  no documentation  no documentation  no documentation   Time  10 Minutes  10 Minutes  10 Minutes  no documentation  no documentation       Interval Training    Row Name 02/20/18 1101 02/24/18 1100 03/08/18 1100 04/12/18 1600 04/26/18 1432   Interval Training  no documentation  no documentation  no documentation  no documentation  No       Bike    Row Name 02/20/18 1101 02/24/18 1100 03/08/18 1100 04/12/18 1600 04/26/18 1432   Level  0.5  0.5  1.4  1.4  1.4  Minutes  10  10  10  10  10    METs  2.06  2.06  3.94  3.94  3.94       NuStep    Row Name 02/20/18 1101 02/24/18 1100 03/08/18 1100 04/12/18 1600 04/26/18 1432   Level  3  3  4  4  4    SPM  80  90  90  95  100   Minutes  10  10  10  10  10    METs  2.6  2.9  3.8  4  4.4       Track    Row Name 02/20/18 1101 02/24/18 1100 03/08/18 1100 04/12/18 1600 04/26/18 1432   Laps  8  16  16  16  16    Minutes  10  10  10  10  10    METs  2.39  3.79  3.79  3.79  3.79       Home Exercise Plan    [place] Name 02/20/18 1101 02/24/18 1100 03/08/18 1100 04/12/18 1600 04/26/18 1432   Plans to continue exercise at  no documentation  no documentation  Home (comment) walking  Home (comment) walking  Home (comment) Walking   Frequency  no documentation  no documentation  Add 2 additional days to program exercise sessions.  Add 2 additional days to program exercise sessions.  Add 2 additional days to program exercise sessions.   Initial Home Exercises Provided  no documentation  no documentation  02/27/18  02/27/18  02/27/18          Exercise Comments: Exercise Comments    Row Name 02/20/18 1109 04/12/18 1636   Exercise Comments   Pt completed first exercise session without signs and symptoms of physical distress. Rehab team will continue to monitor activity levels and progress as tolerated.   Pt's first day of exercise in a month. [place]. Will continue to monitor pt's attendance and response to workloads.       Exercise Goals and Review: Exercise Goals    Exercise Goals    Row Name 02/14/18 0808   Increase Physical Activity  Yes   Intervention  Provide advice, education, support and counseling about physical activity/exercise needs.;Develop an individualized exercise prescription for aerobic and resistive training based on initial evaluation findings, risk stratification, comorbidities and participant's personal goals.   Expected Outcomes  Short Term: Attend rehab on a regular basis to increase amount of physical activity.;Long Term: Add in home exercise to make exercise part of routine and to increase amount of physical activity.;Long Term: Exercising regularly at least 3-5 days a week.   Increase Strength and Stamina  Yes   Intervention  Provide advice, education, support and counseling about physical activity/exercise needs.;Develop an individualized exercise prescription for aerobic and resistive training based on initial evaluation findings, risk stratification, comorbidities and participant's personal goals.   Expected Outcomes  Short Term: Increase workloads from initial exercise prescription for resistance, speed, and METs.;Short Term: Perform resistance training exercises routinely during rehab and add in resistance training at home;Long Term: Improve cardiorespiratory fitness, muscular endurance and strength as measured by increased METs and functional capacity (6MWT)   Able to understand and use rate of perceived exertion (RPE) scale  Yes   Intervention  Provide education and explanation on how to use RPE scale   Expected Outcomes  Short Term: Able to use RPE daily in rehab to express subjective  intensity level;Long Term:  Able to use RPE  to guide intensity level when exercising independently   Knowledge and understanding of Target Heart Rate Range (THRR)  Yes   Intervention  Provide education and explanation of THRR including how the numbers were predicted and where they are located for reference   Expected Outcomes  Short Term: Able to state/look up THRR;Long Term: Able to use THRR to govern intensity when exercising independently;Short Term: Able to use daily as guideline for intensity in rehab   Able to check pulse independently  Yes   Intervention  Provide education and demonstration on how to check pulse in carotid and radial arteries.;Review the importance of being able to check your own pulse for safety during independent exercise   Expected Outcomes  Short Term: Able to explain why pulse checking is important during independent exercise;Long Term: Able to check pulse independently and accurately   Understanding of Exercise Prescription  Yes   Intervention  Provide education, explanation, and written materials on patient's individual exercise prescription   Expected Outcomes  Short Term: Able to explain program exercise prescription;Long Term: Able to explain home exercise prescription to exercise independently          Exercise Goals Re-Evaluation : Exercise Goals Re-Evaluation    Exercise Goal Re-Evaluation    Row Name 02/24/18 1107 04/27/18 1434   Exercise Goals Review  Increase Physical Activity;Increase Strength and Stamina;Understanding of Exercise Prescription;Able to understand and use rate of perceived exertion (RPE) scale  Increase Physical Activity;Understanding of Exercise Prescription   Comments  Pt is able to exercise for 30 minutes without signs/symptoms of physical distress and has a great understanding of exercise program and RPE scale.   Pt is able to exercise for 30 minutes with no problems. Will continue to encourage good attendance. Will follow up with pt  regarding home exercise and consistently.    Expected Outcomes  Pt will improve in cardiorespiratory fitness and overall functional capacity.  Pt will continue to walk for exercise 2-3 days for 30 minutes. Pt will continue to improve cardiorespiratoty fitness. Will continue to monitor and progress pt.           Discharge Exercise Prescription (Final Exercise Prescription Changes): Exercise Prescription Changes - 04/26/18 1432    Response to Exercise          Blood Pressure (Admit)  148/78    Blood Pressure (Exercise)  158/82    Blood Pressure (Exit)  120/80    Heart Rate (Admit)  70 bpm    Heart Rate (Exercise)  131 bpm    Heart Rate (Exit)  79 bpm    Rating of Perceived Exertion (Exercise)  11    Perceived Dyspnea (Exercise)  0    Symptoms  None    Duration  Continue with 30 min of aerobic exercise without signs/symptoms of physical distress.    Intensity  THRR unchanged        Progression          Progression  Continue to progress workloads to maintain intensity without signs/symptoms of physical distress.    Average METs  3.58        Resistance Training          Training Prescription  No        Interval Training          Interval Training  No        Bike          Level  1.4    Minutes  10  METs  3.94        NuStep          Level  4    SPM  100    Minutes  10    METs  4.4        Track          Laps  16    Minutes  10    METs  3.79        Home Exercise Plan          Plans to continue exercise at  Home (comment)   Walking   Frequency  Add 2 additional days to program exercise sessions.    Initial Home Exercises Provided  02/27/18           Nutrition:  Target Goals: Understanding of nutrition guidelines, daily intake of sodium 1500mg , cholesterol 200mg , calories 30% from fat and 7% or less from saturated fats, daily to have 5 or more servings of fruits and vegetables.  Biometrics: Pre Biometrics - 02/14/18 1048    Pre Biometrics           Height  6\' 1"  (1.854 m)    Weight  89.3 kg    Waist Circumference  39 inches    Hip Circumference  40 inches    Waist to Hip Ratio  0.98 %    BMI (Calculated)  25.98    Triceps Skinfold  26 mm    % Body Fat  28.6 %    Grip Strength  37 kg    Flexibility  11 in    Single Leg Stand  0 seconds            Nutrition Therapy Plan and Nutrition Goals: Nutrition Therapy & Goals - 02/14/18 1047    Nutrition Therapy          Diet  consistent carbohydrate heart healthy        Personal Nutrition Goals          Nutrition Goal  Pt to identify and limit food sources of saturated fat, trans fat, and sodium        Intervention Plan          Intervention  Prescribe, educate and counsel regarding individualized specific dietary modifications aiming towards targeted core components such as weight, hypertension, lipid management, diabetes, heart failure and other comorbidities.    Expected Outcomes  Short Term Goal: Understand basic principles of dietary content, such as calories, fat, sodium, cholesterol and nutrients.           Nutrition Assessments: Nutrition Assessments - 02/14/18 1047    MEDFICTS Scores          Pre Score  46           Nutrition Goals Re-Evaluation:   Nutrition Goals Re-Evaluation:   Nutrition Goals Discharge (Final Nutrition Goals Re-Evaluation):   Psychosocial: Target Goals: Acknowledge presence or absence of significant depression and/or stress, maximize coping skills, provide positive support system. Participant is able to verbalize types and ability to use techniques and skills needed for reducing stress and depression.  Initial Review & Psychosocial Screening: Initial Psych Review & Screening - 02/21/18 1606    Initial Review          Current issues with  None Identified        Family Dynamics          Good Support System?  Yes   Raffaela Martino       Barriers  Psychosocial barriers to participate in program  There are no  identifiable barriers or psychosocial needs.        Screening Interventions          Interventions  Encouraged to exercise           Quality of Life Scores: Quality of Life - 02/14/18 1053    Quality of Life          Select  Quality of Life        Quality of Life Scores          Health/Function Pre  28.4 %    Socioeconomic Pre  30 %    Psych/Spiritual Pre  30 %    Family Pre  30 %    GLOBAL Pre  29.25 %          Scores of 19 and below usually indicate a poorer quality of life in these areas.  A difference of  2-3 points is a clinically meaningful difference.  A difference of 2-3 points in the total score of the Quality of Life Index has been associated with significant improvement in overall quality of life, self-image, physical symptoms, and general health in studies assessing change in quality of life.  PHQ-9: Recent Review Flowsheet Data    Depression screen [facility] 2/9 02/21/2018   Decreased Interest 0   Down, Depressed, Hopeless 0   PHQ - 2 Score 0     Interpretation of Total Score  Total Score Depression Severity:  1-4 = Minimal depression, 5-9 = Mild depression, 10-14 = Moderate depression, 15-19 = Moderately severe depression, 20-27 = Severe depression   Psychosocial Evaluation and Intervention: Psychosocial Evaluation - 02/21/18 1610    Psychosocial Evaluation & Interventions          Interventions  Encouraged to exercise with the program and follow exercise prescription    Comments  no psychosocial needs identified, no interventions necessary. pt enjoys watching TV, gardening, walking and using his computer.      Expected Outcomes  pt will exhibit positive outlook with good coping skills.     Continue Psychosocial Services   No Follow up required           Psychosocial Re-Evaluation: Psychosocial Re-Evaluation    Psychosocial Re-Evaluation    [place] Name 02/24/18 1221 04/27/18 1510   Current issues with  None Identified  None Identified   Comments  no  psychosocial needs identified, no interventions necessary   pt currently moving from [place], otherwise no psychosocial needs identified, no interventions necessary   Expected Outcomes  pt will exhibit positive outlook with good coping skills.   pt will exhibit positive outlook with good coping skills.    Interventions  Encouraged to attend Cardiac Rehabilitation for the exercise  Encouraged to attend Cardiac Rehabilitation for the exercise   Continue Psychosocial Services   No Follow up required  No Follow up required          Psychosocial Discharge (Final Psychosocial Re-Evaluation): Psychosocial Re-Evaluation - 04/27/18 1510    Psychosocial Re-Evaluation          Current issues with  None Identified    Comments  pt currently moving from [place], otherwise no psychosocial needs identified, no interventions necessary    Expected Outcomes  pt will exhibit positive outlook with good coping skills.     Interventions  Encouraged to attend Cardiac Rehabilitation for the exercise    Continue Psychosocial Services   No Follow up required  Vocational Rehabilitation: Provide vocational rehab assistance to qualifying candidates.   Vocational Rehab Evaluation & Intervention: Vocational Rehab - 02/21/18 1606    Initial Vocational Rehab Evaluation & Intervention          Assessment shows need for Vocational Rehabilitation  No   retired Government social research officer          Education: Education Goals: Education classes will be provided on a weekly basis, covering required topics. Participant will state understanding/return demonstration of topics presented.  Learning Barriers/Preferences: Learning Barriers/Preferences - 02/14/18 8921    Learning Barriers/Preferences          Learning Barriers  Sight    Learning Preferences  Pictoral;Verbal Instruction;Video;Skilled Demonstration;Audio;Written Material           Education Topics: Count Your Pulse:  -Group instruction provided by  verbal instruction, demonstration, patient participation and written materials to support subject.  Instructors address importance of being able to find your pulse and how to count your pulse when at home without a heart monitor.  Patients get hands on experience counting their pulse with staff help and individually. Flowsheet Row CARDIAC REHAB PHASE II EXERCISE from 04/26/2018 in [place]  Date  03/03/18  Educator  RN  Instruction Review Code  2- Demonstrated Understanding      Heart Attack, Angina, and Risk Factor Modification:  -Group instruction provided by verbal instruction, video, and written materials to support subject.  Instructors address signs and symptoms of angina and heart attacks.    Also discuss risk factors for heart disease and how to make changes to improve heart health risk factors. Flowsheet Row CARDIAC REHAB PHASE II EXERCISE from 04/26/2018 in [place]  Date  04/26/18  Educator  RN  Instruction Review Code  2- Demonstrated Understanding      Functional Fitness:  -Group instruction provided by verbal instruction, demonstration, patient participation, and written materials to support subject.  Instructors address safety measures for doing things around the house.  Discuss how to get up and down off the floor, how to pick things up properly, how to safely get out of a chair without assistance, and balance training. Flowsheet Row CARDIAC REHAB PHASE II EXERCISE from 04/26/2018 in [place]  Date  04/14/18  Educator  EP  Instruction Review Code  2- Demonstrated Understanding      Meditation and Mindfulness:  -Group instruction provided by verbal instruction, patient participation, and written materials to support subject.  Instructor addresses importance of mindfulness and meditation practice to help reduce stress and improve awareness.  Instructor also leads participants  through a meditation exercise.    Stretching for Flexibility and Mobility:  -Group instruction provided by verbal instruction, patient participation, and written materials to support subject.  Instructors lead participants through series of stretches that are designed to increase flexibility thus improving mobility.  These stretches are additional exercise for major muscle groups that are typically performed during regular warm up and cool down.   Hands Only CPR:  -Group verbal, video, and participation provides a basic overview of AHA guidelines for community CPR. Role-play of emergencies allow participants the opportunity to practice calling for help and chest compression technique with discussion of AED use.   Hypertension: -Group verbal and written instruction that provides a basic overview of hypertension including the most recent diagnostic guidelines, risk factor reduction with self-care instructions and medication management.    Nutrition I class: Heart Healthy Eating:  -  Group instruction provided by PowerPoint slides, verbal discussion, and written materials to support subject matter. The instructor gives an explanation and review of the Therapeutic Lifestyle Changes diet recommendations, which includes a discussion on lipid goals, dietary fat, sodium, fiber, plant stanol/sterol esters, sugar, and the components of a well-balanced, healthy diet.   Nutrition II class: Lifestyle Skills:  -Group instruction provided by PowerPoint slides, verbal discussion, and written materials to support subject matter. The instructor gives an explanation and review of label reading, grocery shopping for heart health, heart healthy recipe modifications, and ways to make healthier choices when eating out.   Diabetes Question & Answer:  -Group instruction provided by PowerPoint slides, verbal discussion, and written materials to support subject matter. The instructor gives an explanation and review of  diabetes co-morbidities, pre- and post-prandial blood glucose goals, pre-exercise blood glucose goals, signs, symptoms, and treatment of hypoglycemia and hyperglycemia, and foot care basics.   Diabetes Blitz:  -Group instruction provided by PowerPoint slides, verbal discussion, and written materials to support subject matter. The instructor gives an explanation and review of the physiology behind type 1 and type 2 diabetes, diabetes medications and rational behind using different medications, pre- and post-prandial blood glucose recommendations and Hemoglobin A1c goals, diabetes diet, and exercise including blood glucose guidelines for exercising safely.    Portion Distortion:  -Group instruction provided by PowerPoint slides, verbal discussion, written materials, and food models to support subject matter. The instructor gives an explanation of serving size versus portion size, changes in portions sizes over the last 20 years, and what consists of a serving from each food group.   Stress Management:  -Group instruction provided by verbal instruction, video, and written materials to support subject matter.  Instructors review role of stress in heart disease and how to cope with stress positively.     Exercising on Your Own:  -Group instruction provided by verbal instruction, power point, and written materials to support subject.  Instructors discuss benefits of exercise, components of exercise, frequency and intensity of exercise, and end points for exercise.  Also discuss use of nitroglycerin and activating EMS.  Review options of places to exercise outside of rehab.  Review guidelines for sex with heart disease.   Cardiac Drugs I:  -Group instruction provided by verbal instruction and written materials to support subject.  Instructor reviews cardiac drug classes: antiplatelets, anticoagulants, beta blockers, and statins.  Instructor discusses reasons, side effects, and lifestyle considerations  for each drug class.   Cardiac Drugs II:  -Group instruction provided by verbal instruction and written materials to support subject.  Instructor reviews cardiac drug classes: angiotensin converting enzyme inhibitors (ACE-I), angiotensin II receptor blockers (ARBs), nitrates, and calcium channel blockers.  Instructor discusses reasons, side effects, and lifestyle considerations for each drug class. Flowsheet Row CARDIAC REHAB PHASE II EXERCISE from 04/26/2018 in [facility]  Date  03/08/18  Instruction Review Code  2- Demonstrated Understanding      Anatomy and Physiology of the Circulatory System:  Group verbal and written instruction and models provide basic cardiac anatomy and physiology, with the coronary electrical and arterial systems. Review of: AMI, Angina, Valve disease, Heart Failure, Peripheral Artery Disease, Cardiac Arrhythmia, Pacemakers, and the ICD. Flowsheet Row CARDIAC REHAB PHASE II EXERCISE from 04/26/2018 in [place]  Date  02/22/18  Educator  RN  Instruction Review Code  2- Demonstrated Understanding      Other Education:  -Group or individual verbal, written,  or video instructions that support the educational goals of the cardiac rehab program.   Holiday Eating Survival Tips:  -Group instruction provided by PowerPoint slides, verbal discussion, and written materials to support subject matter. The instructor gives patients tips, tricks, and techniques to help them not only survive but enjoy the holidays despite the onslaught of food that accompanies the holidays.   Knowledge Questionnaire Score: Knowledge Questionnaire Score - 02/14/18 1019    Knowledge Questionnaire Score          Pre Score  23/24           Core Components/Risk Factors/Patient Goals at Admission: Personal Goals and Risk Factors at Admission - 02/14/18 1049    Core Components/Risk Factors/Patient Goals on Admission           Diabetes  Yes    Intervention  Provide education about signs/symptoms and action to take for hypo/hyperglycemia.;Provide education about proper nutrition, including hydration, and aerobic/resistive exercise prescription along with prescribed medications to achieve blood glucose in normal ranges: Fasting glucose 65-99 mg/dL    Expected Outcomes  Short Term: Participant verbalizes understanding of the signs/symptoms and immediate care of hyper/hypoglycemia, proper foot care and importance of medication, aerobic/resistive exercise and nutrition plan for blood glucose control.;Long Term: Attainment of HbA1C < 7%.    Hypertension  Yes    Intervention  Provide education on lifestyle modifcations including regular physical activity/exercise, weight management, moderate sodium restriction and increased consumption of fresh fruit, vegetables, and low fat dairy, alcohol moderation, and smoking cessation.;Monitor prescription use compliance.    Expected Outcomes  Short Term: Continued assessment and intervention until BP is < 140/2mm HG in hypertensive participants. < 130/31mm HG in hypertensive participants with diabetes, heart failure or chronic kidney disease.;Long Term: Maintenance of blood pressure at goal levels.    Lipids  Yes    Intervention  Provide education and support for participant on nutrition & aerobic/resistive exercise along with prescribed medications to achieve LDL 70mg , HDL >40mg .    Expected Outcomes  Short Term: Participant states understanding of desired cholesterol values and is compliant with medications prescribed. Participant is following exercise prescription and nutrition guidelines.;Long Term: Cholesterol controlled with medications as prescribed, with individualized exercise RX and with personalized nutrition plan. Value goals: LDL < 70mg , HDL > 40 mg.           Core Components/Risk Factors/Patient Goals Review:  Goals and Risk Factor Review    Core Components/Risk  Factors/Patient Goals Review    Row Name 02/21/18 0807 02/24/18 1219 04/27/18 1511   Personal Goals Review  Diabetes;Hypertension;Lipids  Diabetes;Hypertension;Lipids  Diabetes;Hypertension;Lipids   Review  pt with multiple CAD RF demonstrates willingness to participate in CR program.  pt demonstrates excellent DM self management. pt checks CBG TID for good and verbalizes desire to maintain good ranges. pt personal goals are to attend cardiac rehab and improve health  pt with multiple CAD RF demonstrates willingness to participate in CR program.  pt demonstrates excellent DM self management with good exercise CBG  trends.  pt personal goals are to attend cardiac rehab and improve health  pt with multiple CAD RF demonstrates willingness to participate in CR program.  pt demonstrates excellent DM self management with good exercise CBG  trends.  pt personal goals are to attend cardiac rehab and improve health. pt plans to have consistent attendance now that his moving has finalized.    Expected Outcomes  pt will participate in CR exercise, nutrition and lifestyle modification to decrease overall  RF.   pt will participate in CR exercise, nutrition and lifestyle modification to decrease overall RF.   pt will participate in CR exercise, nutrition and lifestyle modification to decrease overall RF.           Core Components/Risk Factors/Patient Goals at Discharge (Final Review):  Goals and Risk Factor Review - 04/27/18 1511    Core Components/Risk Factors/Patient Goals Review          Personal Goals Review  Diabetes;Hypertension;Lipids    Review  pt with multiple CAD RF demonstrates willingness to participate in CR program.  pt demonstrates excellent DM self management with good exercise CBG  trends.  pt personal goals are to attend cardiac rehab and improve health. pt plans to have consistent attendance now that his moving has finalized.     Expected Outcomes  pt will participate in CR exercise, nutrition  and lifestyle modification to decrease overall RF.            ITP Comments: ITP Comments    Row Name 02/14/18 0807 02/20/18 0804 02/24/18 1219 03/30/18 1713 04/27/18 1510   ITP Comments  Dr. Fransico Him, Medical Director  pt started group exercise. pt tolerated light activity without difficulty. pt oriented to exercise equipment and safety routine.   30 day ITP review. pt demonstrates eagerness to participate in CR program.   30 day ITP review. pt demonstrates eagerness to participate in CR program. pt recent absences due to scheduled travel  30 day ITP review. pt demonstrates eagerness to participate in CR program. pt recent absences due to scheduled travel      Comments:

## 2018-04-28 ENCOUNTER — Encounter (HOSPITAL_COMMUNITY)
Admission: RE | Admit: 2018-04-28 | Discharge: 2018-04-28 | Disposition: A | Discharge status: Home / Self Care | Payer: Medicare Other | Source: Ambulatory Visit | Attending: Cardiology | Admitting: Cardiology

## 2018-04-28 ENCOUNTER — Encounter (HOSPITAL_COMMUNITY): Payer: Medicare Other

## 2018-04-28 DIAGNOSIS — Z950 Presence of cardiac pacemaker: Secondary | ICD-10-CM | POA: Diagnosis not present

## 2018-04-28 DIAGNOSIS — E119 Type 2 diabetes mellitus without complications: Secondary | ICD-10-CM | POA: Diagnosis not present

## 2018-04-28 DIAGNOSIS — E7800 Pure hypercholesterolemia, unspecified: Secondary | ICD-10-CM | POA: Diagnosis not present

## 2018-04-28 DIAGNOSIS — I252 Old myocardial infarction: Secondary | ICD-10-CM | POA: Diagnosis not present

## 2018-04-28 DIAGNOSIS — I10 Essential (primary) hypertension: Secondary | ICD-10-CM | POA: Diagnosis not present

## 2018-04-28 DIAGNOSIS — Z955 Presence of coronary angioplasty implant and graft: Secondary | ICD-10-CM

## 2018-05-01 ENCOUNTER — Encounter (HOSPITAL_COMMUNITY): Payer: Medicare Other

## 2018-05-01 ENCOUNTER — Encounter (HOSPITAL_COMMUNITY)
Admission: RE | Admit: 2018-05-01 | Discharge: 2018-05-01 | Disposition: A | Discharge status: Home / Self Care | Payer: Medicare Other | Source: Ambulatory Visit | Attending: Cardiology | Admitting: Cardiology

## 2018-05-01 DIAGNOSIS — Z955 Presence of coronary angioplasty implant and graft: Secondary | ICD-10-CM | POA: Diagnosis not present

## 2018-05-01 DIAGNOSIS — I252 Old myocardial infarction: Secondary | ICD-10-CM | POA: Diagnosis not present

## 2018-05-01 DIAGNOSIS — E7800 Pure hypercholesterolemia, unspecified: Secondary | ICD-10-CM | POA: Diagnosis not present

## 2018-05-01 DIAGNOSIS — E119 Type 2 diabetes mellitus without complications: Secondary | ICD-10-CM | POA: Diagnosis not present

## 2018-05-01 DIAGNOSIS — Z950 Presence of cardiac pacemaker: Secondary | ICD-10-CM | POA: Diagnosis not present

## 2018-05-01 DIAGNOSIS — I10 Essential (primary) hypertension: Secondary | ICD-10-CM | POA: Diagnosis not present

## 2018-05-03 ENCOUNTER — Encounter (HOSPITAL_COMMUNITY)
Admission: RE | Admit: 2018-05-03 | Discharge: 2018-05-03 | Disposition: A | Discharge status: Home / Self Care | Payer: Medicare Other | Source: Ambulatory Visit | Attending: Cardiology | Admitting: Cardiology

## 2018-05-03 ENCOUNTER — Encounter (HOSPITAL_COMMUNITY): Payer: Medicare Other

## 2018-05-03 DIAGNOSIS — Z950 Presence of cardiac pacemaker: Secondary | ICD-10-CM | POA: Diagnosis not present

## 2018-05-03 DIAGNOSIS — E7800 Pure hypercholesterolemia, unspecified: Secondary | ICD-10-CM | POA: Diagnosis not present

## 2018-05-03 DIAGNOSIS — Z955 Presence of coronary angioplasty implant and graft: Secondary | ICD-10-CM | POA: Diagnosis not present

## 2018-05-03 DIAGNOSIS — I252 Old myocardial infarction: Secondary | ICD-10-CM | POA: Diagnosis not present

## 2018-05-03 DIAGNOSIS — I10 Essential (primary) hypertension: Secondary | ICD-10-CM | POA: Diagnosis not present

## 2018-05-03 DIAGNOSIS — E119 Type 2 diabetes mellitus without complications: Secondary | ICD-10-CM | POA: Diagnosis not present

## 2018-05-05 ENCOUNTER — Encounter (HOSPITAL_COMMUNITY)
Admission: RE | Admit: 2018-05-05 | Discharge: 2018-05-05 | Disposition: A | Discharge status: Home / Self Care | Payer: Medicare Other | Source: Ambulatory Visit | Attending: Cardiology | Admitting: Cardiology

## 2018-05-05 ENCOUNTER — Encounter (HOSPITAL_COMMUNITY): Payer: Medicare Other

## 2018-05-05 DIAGNOSIS — Z955 Presence of coronary angioplasty implant and graft: Secondary | ICD-10-CM | POA: Diagnosis not present

## 2018-05-05 DIAGNOSIS — Z950 Presence of cardiac pacemaker: Secondary | ICD-10-CM | POA: Diagnosis not present

## 2018-05-05 DIAGNOSIS — I252 Old myocardial infarction: Secondary | ICD-10-CM | POA: Diagnosis not present

## 2018-05-05 DIAGNOSIS — E7800 Pure hypercholesterolemia, unspecified: Secondary | ICD-10-CM | POA: Diagnosis not present

## 2018-05-05 DIAGNOSIS — I10 Essential (primary) hypertension: Secondary | ICD-10-CM | POA: Diagnosis not present

## 2018-05-05 DIAGNOSIS — E119 Type 2 diabetes mellitus without complications: Secondary | ICD-10-CM | POA: Diagnosis not present

## 2018-05-08 ENCOUNTER — Encounter (HOSPITAL_COMMUNITY)
Admission: RE | Admit: 2018-05-08 | Discharge: 2018-05-08 | Disposition: A | Discharge status: Home / Self Care | Payer: Medicare Other | Source: Ambulatory Visit | Attending: Cardiology | Admitting: Cardiology

## 2018-05-08 ENCOUNTER — Encounter (HOSPITAL_COMMUNITY): Payer: Medicare Other

## 2018-05-08 DIAGNOSIS — E119 Type 2 diabetes mellitus without complications: Secondary | ICD-10-CM | POA: Diagnosis not present

## 2018-05-08 DIAGNOSIS — I10 Essential (primary) hypertension: Secondary | ICD-10-CM | POA: Diagnosis not present

## 2018-05-08 DIAGNOSIS — I252 Old myocardial infarction: Secondary | ICD-10-CM | POA: Diagnosis not present

## 2018-05-08 DIAGNOSIS — E7800 Pure hypercholesterolemia, unspecified: Secondary | ICD-10-CM | POA: Diagnosis not present

## 2018-05-08 DIAGNOSIS — Z955 Presence of coronary angioplasty implant and graft: Secondary | ICD-10-CM

## 2018-05-08 DIAGNOSIS — Z950 Presence of cardiac pacemaker: Secondary | ICD-10-CM | POA: Diagnosis not present

## 2018-05-10 ENCOUNTER — Encounter (HOSPITAL_COMMUNITY): Payer: Medicare Other

## 2018-05-10 ENCOUNTER — Encounter (HOSPITAL_COMMUNITY)
Admission: RE | Admit: 2018-05-10 | Discharge: 2018-05-10 | Disposition: A | Discharge status: Home / Self Care | Payer: Medicare Other | Source: Ambulatory Visit | Attending: Cardiology | Admitting: Cardiology

## 2018-05-10 DIAGNOSIS — E119 Type 2 diabetes mellitus without complications: Secondary | ICD-10-CM | POA: Diagnosis not present

## 2018-05-10 DIAGNOSIS — Z955 Presence of coronary angioplasty implant and graft: Secondary | ICD-10-CM | POA: Diagnosis not present

## 2018-05-10 DIAGNOSIS — E7800 Pure hypercholesterolemia, unspecified: Secondary | ICD-10-CM | POA: Diagnosis not present

## 2018-05-10 DIAGNOSIS — I10 Essential (primary) hypertension: Secondary | ICD-10-CM | POA: Diagnosis not present

## 2018-05-10 DIAGNOSIS — Z950 Presence of cardiac pacemaker: Secondary | ICD-10-CM | POA: Diagnosis not present

## 2018-05-10 DIAGNOSIS — I252 Old myocardial infarction: Secondary | ICD-10-CM | POA: Diagnosis not present

## 2018-05-12 ENCOUNTER — Encounter (HOSPITAL_COMMUNITY)
Admission: RE | Admit: 2018-05-12 | Discharge: 2018-05-12 | Disposition: A | Discharge status: Home / Self Care | Payer: Medicare Other | Source: Ambulatory Visit | Attending: Cardiology | Admitting: Cardiology

## 2018-05-12 ENCOUNTER — Encounter (HOSPITAL_COMMUNITY): Payer: Medicare Other

## 2018-05-12 DIAGNOSIS — Z950 Presence of cardiac pacemaker: Secondary | ICD-10-CM | POA: Diagnosis not present

## 2018-05-12 DIAGNOSIS — E7800 Pure hypercholesterolemia, unspecified: Secondary | ICD-10-CM | POA: Diagnosis not present

## 2018-05-12 DIAGNOSIS — I10 Essential (primary) hypertension: Secondary | ICD-10-CM | POA: Diagnosis not present

## 2018-05-12 DIAGNOSIS — I252 Old myocardial infarction: Secondary | ICD-10-CM | POA: Diagnosis not present

## 2018-05-12 DIAGNOSIS — Z955 Presence of coronary angioplasty implant and graft: Secondary | ICD-10-CM

## 2018-05-12 DIAGNOSIS — E119 Type 2 diabetes mellitus without complications: Secondary | ICD-10-CM | POA: Diagnosis not present

## 2018-05-15 ENCOUNTER — Encounter (HOSPITAL_COMMUNITY)
Admission: RE | Admit: 2018-05-15 | Discharge: 2018-05-15 | Disposition: A | Discharge status: Home / Self Care | Payer: Medicare Other | Source: Ambulatory Visit | Attending: Cardiology | Admitting: Cardiology

## 2018-05-15 ENCOUNTER — Encounter (HOSPITAL_COMMUNITY): Payer: Medicare Other

## 2018-05-15 DIAGNOSIS — I10 Essential (primary) hypertension: Secondary | ICD-10-CM | POA: Diagnosis not present

## 2018-05-15 DIAGNOSIS — Z955 Presence of coronary angioplasty implant and graft: Secondary | ICD-10-CM

## 2018-05-15 DIAGNOSIS — E7800 Pure hypercholesterolemia, unspecified: Secondary | ICD-10-CM | POA: Diagnosis not present

## 2018-05-15 DIAGNOSIS — I252 Old myocardial infarction: Secondary | ICD-10-CM | POA: Diagnosis not present

## 2018-05-15 DIAGNOSIS — E119 Type 2 diabetes mellitus without complications: Secondary | ICD-10-CM | POA: Diagnosis not present

## 2018-05-15 DIAGNOSIS — Z950 Presence of cardiac pacemaker: Secondary | ICD-10-CM | POA: Diagnosis not present

## 2018-05-17 ENCOUNTER — Encounter (HOSPITAL_COMMUNITY)
Admission: RE | Admit: 2018-05-17 | Discharge: 2018-05-17 | Disposition: A | Discharge status: Home / Self Care | Payer: Medicare Other | Source: Ambulatory Visit | Attending: Cardiology | Admitting: Cardiology

## 2018-05-17 ENCOUNTER — Encounter (HOSPITAL_COMMUNITY): Payer: Medicare Other

## 2018-05-17 VITALS — Ht 73.0 in | Wt 199.5 lb

## 2018-05-17 DIAGNOSIS — I252 Old myocardial infarction: Secondary | ICD-10-CM | POA: Diagnosis not present

## 2018-05-17 DIAGNOSIS — Z950 Presence of cardiac pacemaker: Secondary | ICD-10-CM | POA: Diagnosis not present

## 2018-05-17 DIAGNOSIS — Z955 Presence of coronary angioplasty implant and graft: Secondary | ICD-10-CM | POA: Diagnosis not present

## 2018-05-17 DIAGNOSIS — E119 Type 2 diabetes mellitus without complications: Secondary | ICD-10-CM | POA: Diagnosis not present

## 2018-05-17 DIAGNOSIS — E7800 Pure hypercholesterolemia, unspecified: Secondary | ICD-10-CM | POA: Diagnosis not present

## 2018-05-17 DIAGNOSIS — I10 Essential (primary) hypertension: Secondary | ICD-10-CM | POA: Diagnosis not present

## 2018-05-19 ENCOUNTER — Encounter (HOSPITAL_COMMUNITY): Payer: Medicare Other

## 2018-05-19 ENCOUNTER — Encounter (HOSPITAL_COMMUNITY)
Admission: RE | Admit: 2018-05-19 | Discharge: 2018-05-19 | Disposition: A | Discharge status: Home / Self Care | Payer: Medicare Other | Source: Ambulatory Visit | Attending: Cardiology | Admitting: Cardiology

## 2018-05-19 DIAGNOSIS — E119 Type 2 diabetes mellitus without complications: Secondary | ICD-10-CM | POA: Diagnosis not present

## 2018-05-19 DIAGNOSIS — I10 Essential (primary) hypertension: Secondary | ICD-10-CM | POA: Diagnosis not present

## 2018-05-19 DIAGNOSIS — E7800 Pure hypercholesterolemia, unspecified: Secondary | ICD-10-CM | POA: Diagnosis not present

## 2018-05-19 DIAGNOSIS — Z955 Presence of coronary angioplasty implant and graft: Secondary | ICD-10-CM

## 2018-05-19 DIAGNOSIS — Z950 Presence of cardiac pacemaker: Secondary | ICD-10-CM | POA: Diagnosis not present

## 2018-05-19 DIAGNOSIS — I252 Old myocardial infarction: Secondary | ICD-10-CM | POA: Diagnosis not present

## 2018-05-22 ENCOUNTER — Encounter (HOSPITAL_COMMUNITY)
Admission: RE | Admit: 2018-05-22 | Discharge: 2018-05-22 | Disposition: A | Discharge status: Home / Self Care | Payer: Medicare Other | Source: Ambulatory Visit | Attending: Cardiology | Admitting: Cardiology

## 2018-05-22 ENCOUNTER — Encounter (HOSPITAL_COMMUNITY): Payer: Medicare Other

## 2018-05-22 DIAGNOSIS — I10 Essential (primary) hypertension: Secondary | ICD-10-CM | POA: Diagnosis not present

## 2018-05-22 DIAGNOSIS — Z955 Presence of coronary angioplasty implant and graft: Secondary | ICD-10-CM | POA: Diagnosis not present

## 2018-05-22 DIAGNOSIS — E119 Type 2 diabetes mellitus without complications: Secondary | ICD-10-CM | POA: Diagnosis not present

## 2018-05-22 DIAGNOSIS — Z950 Presence of cardiac pacemaker: Secondary | ICD-10-CM | POA: Diagnosis not present

## 2018-05-22 DIAGNOSIS — I252 Old myocardial infarction: Secondary | ICD-10-CM | POA: Diagnosis not present

## 2018-05-22 DIAGNOSIS — E7800 Pure hypercholesterolemia, unspecified: Secondary | ICD-10-CM | POA: Diagnosis not present

## 2018-05-23 DIAGNOSIS — Z950 Presence of cardiac pacemaker: Secondary | ICD-10-CM | POA: Diagnosis not present

## 2018-05-23 DIAGNOSIS — Z45018 Encounter for adjustment and management of other part of cardiac pacemaker: Secondary | ICD-10-CM | POA: Diagnosis not present

## 2018-05-23 DIAGNOSIS — I495 Sick sinus syndrome: Secondary | ICD-10-CM | POA: Diagnosis not present

## 2018-05-24 ENCOUNTER — Encounter (HOSPITAL_COMMUNITY): Payer: Medicare Other

## 2018-05-24 ENCOUNTER — Encounter (HOSPITAL_COMMUNITY)
Admission: RE | Admit: 2018-05-24 | Discharge: 2018-05-24 | Disposition: A | Discharge status: Home / Self Care | Payer: Medicare Other | Source: Ambulatory Visit | Attending: Cardiology | Admitting: Cardiology

## 2018-05-24 ENCOUNTER — Encounter (HOSPITAL_COMMUNITY): Payer: Self-pay

## 2018-05-24 DIAGNOSIS — Z955 Presence of coronary angioplasty implant and graft: Secondary | ICD-10-CM

## 2018-05-24 DIAGNOSIS — I10 Essential (primary) hypertension: Secondary | ICD-10-CM | POA: Diagnosis not present

## 2018-05-24 DIAGNOSIS — Z950 Presence of cardiac pacemaker: Secondary | ICD-10-CM | POA: Diagnosis not present

## 2018-05-24 DIAGNOSIS — E7800 Pure hypercholesterolemia, unspecified: Secondary | ICD-10-CM | POA: Diagnosis not present

## 2018-05-24 DIAGNOSIS — I252 Old myocardial infarction: Secondary | ICD-10-CM | POA: Diagnosis not present

## 2018-05-24 DIAGNOSIS — E119 Type 2 diabetes mellitus without complications: Secondary | ICD-10-CM | POA: Diagnosis not present

## 2018-05-31 DIAGNOSIS — E118 Type 2 diabetes mellitus with unspecified complications: Secondary | ICD-10-CM | POA: Diagnosis not present

## 2018-05-31 DIAGNOSIS — Z23 Encounter for immunization: Secondary | ICD-10-CM | POA: Diagnosis not present

## 2018-05-31 DIAGNOSIS — I517 Cardiomegaly: Secondary | ICD-10-CM | POA: Diagnosis not present

## 2018-08-30 ENCOUNTER — Ambulatory Visit (INDEPENDENT_AMBULATORY_CARE_PROVIDER_SITE_OTHER): Payer: Medicare Other | Admitting: Cardiology

## 2018-08-30 ENCOUNTER — Encounter: Payer: Self-pay | Admitting: Cardiology

## 2018-08-30 VITALS — BP 129/68 | HR 68 | Ht 74.0 in | Wt 201.5 lb

## 2018-08-30 DIAGNOSIS — E119 Type 2 diabetes mellitus without complications: Secondary | ICD-10-CM

## 2018-08-30 DIAGNOSIS — Z951 Presence of aortocoronary bypass graft: Secondary | ICD-10-CM | POA: Diagnosis not present

## 2018-08-30 DIAGNOSIS — I2510 Atherosclerotic heart disease of native coronary artery without angina pectoris: Secondary | ICD-10-CM

## 2018-08-30 DIAGNOSIS — I209 Angina pectoris, unspecified: Secondary | ICD-10-CM

## 2018-08-30 DIAGNOSIS — I25118 Atherosclerotic heart disease of native coronary artery with other forms of angina pectoris: Secondary | ICD-10-CM | POA: Diagnosis not present

## 2018-08-30 DIAGNOSIS — Z45018 Encounter for adjustment and management of other part of cardiac pacemaker: Secondary | ICD-10-CM | POA: Diagnosis not present

## 2018-08-30 DIAGNOSIS — E118 Type 2 diabetes mellitus with unspecified complications: Secondary | ICD-10-CM | POA: Diagnosis not present

## 2018-08-30 DIAGNOSIS — E782 Mixed hyperlipidemia: Secondary | ICD-10-CM

## 2018-08-30 DIAGNOSIS — Z8679 Personal history of other diseases of the circulatory system: Secondary | ICD-10-CM

## 2018-08-30 DIAGNOSIS — Z950 Presence of cardiac pacemaker: Secondary | ICD-10-CM | POA: Diagnosis not present

## 2018-08-30 DIAGNOSIS — I10 Essential (primary) hypertension: Secondary | ICD-10-CM

## 2018-08-30 HISTORY — DX: Atherosclerotic heart disease of native coronary artery without angina pectoris: I25.10

## 2018-08-30 NOTE — Progress Notes (Signed)
Subjective:   _0  ID: Jesse [name], male    DOB: 17-Jun-1941, 78 y.o.   MRN: [id number]  CC: CAD and pacemaker follow up:  HPI:  Jesse [name] is a Caucasian male with coronary artery disease and CABG in 2007 in [place] with LIMA to LAD, SVG to OM which is occluded by angiography on 02/04/2015, occluded SVG to RCA. He underwent complex successful arthrectomy and PCI to Cx on 11/29/2017. His past medical history also includes sick sinus syndrome status post Medtronic pacemaker implantation 2011 for marked bradycardia and sick sinus syndrome, diabetes mellitus, hypertension and hyperlipidemia.  Patient presents here for 6 month office visit and follow-up, presently doing well and essentially remains asymptomatic.  He has not had any recurrence of angina pectoris since angioplasty.  Or office visit and follow-up of CAD and also pacemaker check.    Past Medical History:  Diagnosis Date  . CAD (coronary artery disease), native coronary artery 08/30/2018   2007 [place], [place]: LIMA to LAD, Occluded SVG to RCA and OM by angiogram S/P  PCI/orbital atherectomy S/P 2.5x15 Resoluute to prox Cx on 11/29/2017.  Marland Kitchen Coronary artery disease   . High cholesterol   . Hypertension   . Myocardial infarction [facility]) 2017/2018?  . NSTEMI (non-ST elevated myocardial infarction) ([place]) 02/03/2015  . Presence of permanent cardiac pacemaker   . Type II diabetes mellitus ([place])     Past Surgical History:  Procedure Laterality Date  . CARDIAC CATHETERIZATION N/A 02/04/2015   Procedure: Left Heart Cath and Cors/Grafts Angiography;  Surgeon: Adrian Prows, MD;  Location: [facility];  Service: Cardiovascular;  Laterality: N/A;  . CARDIAC CATHETERIZATION N/A 02/04/2015   Procedure: Coronary Stent Intervention;  Surgeon: Adrian Prows, MD;  Location: [facility];  Service: Cardiovascular;  Laterality: N/A;  . CORONARY ANGIOGRAPHY N/A 10/18/2017   Procedure: CORONARY ANGIOGRAPHY;  Surgeon: Adrian Prows, MD;   Location: [facility];  Service: Cardiovascular;  Laterality: N/A;  . CORONARY ANGIOPLASTY    . CORONARY ANGIOPLASTY WITH STENT PLACEMENT     "i've got 5-6 stents total" (08/30/2017)  . CORONARY ARTERY BYPASS GRAFT  2007   CABG X3  . CORONARY ATHERECTOMY N/A 10/18/2017   Procedure: CORONARY ATHERECTOMY;  Surgeon: Adrian Prows, MD;  Location: [facility];  Service: Cardiovascular;  Laterality: N/A;  . CORONARY ATHERECTOMY N/A 11/29/2017   Procedure: CORONARY ATHERECTOMY;  Surgeon: Adrian Prows, MD;  Location: [facility];  Service: Cardiovascular;  Laterality: N/A;  . CORONARY CTO INTERVENTION N/A 08/30/2017   Procedure: CORONARY CTO INTERVENTION;  Surgeon: Adrian Prows, MD;  Location: [facility];  Service: Cardiovascular;  Laterality: N/A;  . CORONARY STENT INTERVENTION N/A 11/29/2017   Procedure: CORONARY STENT INTERVENTION;  Surgeon: Adrian Prows, MD;  Location: [facility];  Service: Cardiovascular;  Laterality: N/A;  . INSERT / REPLACE / REMOVE PACEMAKER  ?2012   "I think it was placed in 2012"  . LEFT HEART CATH AND CORONARY ANGIOGRAPHY N/A 08/30/2017   Procedure: LEFT HEART CATH AND CORONARY ANGIOGRAPHY;  Surgeon: Adrian Prows, MD;  Location: [facility];  Service: Cardiovascular;  Laterality: N/A;  . LEFT HEART CATH AND CORS/GRAFTS ANGIOGRAPHY N/A 08/05/2017   Procedure: LEFT HEART CATH AND CORS/GRAFTS ANGIOGRAPHY;  Surgeon: Adrian Prows, MD;  Location: [facility];  Service: Cardiovascular;  Laterality: N/A;    Social History   Socioeconomic History  . Marital status: Widowed    Spouse  name: Not on file  . Number of children: 0  . Years of education: Not on file  . Highest education level: Not on file  Occupational History  . Not on file  Social Needs  . Financial resource strain: Not on file  . Food insecurity:    Worry: Not on file    Inability: Not on file  . Transportation needs:    Medical: Not on file    Non-medical: Not on file  Tobacco  Use  . Smoking status: Former Smoker    Years: 3.00    Types: Cigarettes    Last attempt to quit: 1960    Years since quitting: 60.1  . Smokeless tobacco: Never Used  Substance and Sexual Activity  . Alcohol use: No  . Drug use: No  . Sexual activity: Yes  Lifestyle  . Physical activity:    Days per week: Not on file    Minutes per session: Not on file  . Stress: Not on file  Relationships  . Social connections:    Talks on phone: Not on file    Gets together: Not on file    Attends religious service: Not on file    Active member of club or organization: Not on file    Attends meetings of clubs or organizations: Not on file    Relationship status: Not on file  . Intimate partner violence:    Fear of current or ex partner: Not on file    Emotionally abused: Not on file    Physically abused: Not on file    Forced sexual activity: Not on file  Other Topics Concern  . Not on file  Social History Narrative  . Not on file    Current Outpatient Medications on File Prior to Visit  Medication Sig Dispense Refill  . aspirin 81 MG chewable tablet Chew 1 tablet (81 mg total) by mouth daily. 30 tablet 0  . Biotin 5000 MCG TABS Take 5,000 mcg by mouth daily.    . dorzolamide-timolol (COSOPT) 22.3-6.8 MG/ML ophthalmic solution Place 1 drop into both eyes 2 (two) times daily.  10  . glimepiride (AMARYL) 2 MG tablet Take 2 mg by mouth 2 (two) times daily.    Marland Kitchen latanoprost (XALATAN) 0.005 % ophthalmic solution Place 1 drop into both eyes at bedtime.  12  . losartan (COZAAR) 25 MG tablet Take 25 mg by mouth daily.    . metFORMIN (GLUCOPHAGE-XR) 500 MG 24 hr tablet Take 1 tablet (500 mg total) by mouth daily.  1  . metoprolol tartrate (LOPRESSOR) 25 MG tablet Take 0.5 tablets (12.5 mg total) by mouth 3 (three) times daily. (Patient taking differently: Take 25 mg by mouth 2 (two) times daily. ) 90 tablet 0  . nitroGLYCERIN (NITROSTAT) 0.4 MG SL tablet Place 0.4 mg under the tongue every 5  (five) minutes as needed for chest pain.   3  . Omega-3 Fatty Acids (FISH OIL PO) Take 1 capsule by mouth daily.    . rivaroxaban (XARELTO) 2.5 MG TABS tablet Take 2.5 mg by mouth 2 (two) times daily.    . simvastatin (ZOCOR) 40 MG tablet Take 40 mg by mouth daily.    . ticagrelor (BRILINTA) 90 MG TABS tablet Take 1 tablet (90 mg total) by mouth 2 (two) times daily. 60 tablet 3  . vitamin B-12 (CYANOCOBALAMIN) 1000 MCG tablet Take 1,000 mcg by mouth daily.     No current facility-administered medications on file prior to visit.  Review of Systems  Constitutional: Negative for malaise/fatigue and weight loss.  Respiratory: Negative for cough, hemoptysis and shortness of breath.   Cardiovascular: Negative for chest pain, palpitations, claudication and leg swelling.  Gastrointestinal: Negative for abdominal pain, blood in stool, constipation, heartburn and vomiting.  Genitourinary: Negative for dysuria.  Musculoskeletal: Negative for joint pain and myalgias.       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 Negative for dizziness and headaches.  Endo/Heme/Allergies: Does not bruise/bleed easily.  Psychiatric/Behavioral: Negative for depression. The patient is not nervous/anxious.   All other systems reviewed and are negative.      Objective:  There were no vitals taken for this visit.  Physical Exam  Constitutional: He appears well-developed and well-nourished. No distress.  HENT:  Head: Atraumatic.  Eyes: Conjunctivae are normal.  Neck: Neck supple. No JVD present. No thyromegaly present.  Cardiovascular: Normal rate, regular rhythm and normal heart sounds. Exam reveals no gallop.  No murmur heard. Pulses:      Carotid pulses are 3+ on the right side and 3+ on the left side.      Radial pulses are 2+ on the right side and 2+ on the left side.       Femoral pulses are 2+ on the right side and  2+ on the left side.      Popliteal pulses are 2+ on the right side and 2+ on the left side.       Dorsalis pedis pulses are 2+ on the right side and 2+ on the left side.       Posterior tibial pulses are 1+ on the right side and 1+ on the left side.  Pulmonary/Chest: Effort normal and breath sounds normal.  Abdominal: Soft. Bowel sounds are normal.  Musculoskeletal: Normal range of motion.        General: No edema.  Neurological: He is alert.  Skin: Skin is warm and dry.  Psychiatric: He has a normal mood and affect.    Coronary Angiogram 11/29/2017:  (CABG 2007 [place], [place]): LIMA to LAD, Occluded SVG to RCA and OM by angiogram S/P  PCI/orbital atherectomy S/P 2.5x15 Resoluute to prox Cx on 11/29/2017.  Echocardiogram  08/24/2017: Left ventricle cavity is normal in size. Moderate concentric hypertrophy of the left ventricle. Visual EF is 45-50%. Abnormal septal wall motion due to post-operative coronary artery bypass graft. Doppler evidence of grade I (impaired) diastolic dysfunction, normal LAP. Mild (Grade I) aortic regurgitation. Mild (Grade I) mitral regurgitation. Inadequate tricuspid regurgitation jet to estimate pulmonary artery pressure. Normal right atrial pressure. Compared to prior study dated 25/36/6440, LV systolic function is mildly reduced.  Nuclear stress test [02/14/2017]: 1. The resting electrocardiogram demonstrated normal sinus rhythm and incomplete RBBB. Inferior and posterior infarct, old. Stress EKG is non-diagnostic for ischemia as it a pharmacologic stress using Lexiscan. Stress symptoms included dyspnea. Patient unable to complete stress test due to fatigue and switched to [place]. 2. Myocardial perfusion imaging is abnormal. There is a moderate area of infarction in the basal inferior, mid inferior and apical inferior myocardial wall(s). Gated SPECT imaging demonstrates akinesis of the basal inferior, mid inferior and apical inferior myocardial wall(s). The  left ventricular ejection fraction was calculated or visually estimated to be 38%. This is an intermediate risk study, clinical correlation recommended.  Assessment & Recommendations:   1. Coronary artery disease of native artery of native heart with stable angina  pectoris [facility]) 2007 [place], [place]: LIMA to LAD, Occluded SVG to RCA and OM by angiogram S/P  PCI/orbital atherectomy S/P 2.5x15 Resoluute to prox Cx on 11/29/2017. EKG 08/30/2018: A paced rhythm with first-degree AV block at the rate of 68 bpm, left axis deviation, left anterior fascicular block.  Right bundle branch block.  No evidence of ischemia.   EKG 12/09/2017: NSR at 70 bpm, left axis deviation, left anterior fascicular block.  Right bundle branch block.  Cannot exclude inferior infarct old, posterior infarct old.  No evidence of ischemia. No significant change from EKG 10/27/2017 previously A paced.  2. S/P CABG x 3 Only LIMA to LAD patent by angiogram 2019  3. Angina pectoris ([place]) No recurrence last 6 months. Not used S/L NTG   4. Presence of permanent cardiac pacemaker Sick sinus syndrome  Medtronic adapta [id number] pacemaker implantation in 11/20/2009 for sick sinus syndrome in [place].  Scheduled in office pacemaker check 08/30/2018: Normal thresholds.  Underlying sinus rhythm around 50-55 bpm, AP >42%, VP <1%, no AMS episodes.  Normal thresholds, normal pacemaker function.  Longevity 2.5 years.  Continue remote monitoring.  Scheduled Remote Pacemaker transmission 05/23/2018: No high rate episodes. Normal lead trends. Battery longevity is 1 - 64yr. RA pacing is 39.2 %, RV pacing is 0.1 %.  5. Mixed hyperlipidemia: Had labs drawn by Dr. PShelia Mediatoday 11/25/2017: Glucose 168, creatinine 1.15, EGFR 61/70, potassium 4.9, BMP normal.  CBC normal.  INR 1.2, prothrombin time 12.7.  10/13/2017: Glucose 121, creatinine 1.04, EGFR 69/80, potassium 4.5, BMP otherwise normal.  CBC normal.  INR 1.1, prothrombin time 11.3.  Cholesterol 125,  triglycerides 173, HDL 29, LDL 61.  6. Type 2 diabetes mellitus without complication, without long-term current use of insulin Stable and being followed by Dr. PShelia Media 7. Essential hypertension Controlled  Recommendations:Plan he is presently doing well and essentially remains asymptomatic as dictated above.  No changes in the medications were done today.  I will see him back in 6 months for follow-up.  He'll bring me copies of her labs. EKG does not reveal any new changes.   JAdrian Prows MD, FSouth Sunflower County Hospital2/11/2018, 7:26 AM [facility]. PRiverview EstatesPager: [phone] Office: [phone] no answer Cell [phone]

## 2018-08-31 DIAGNOSIS — Z23 Encounter for immunization: Secondary | ICD-10-CM | POA: Diagnosis not present

## 2018-08-31 DIAGNOSIS — E782 Mixed hyperlipidemia: Secondary | ICD-10-CM | POA: Diagnosis not present

## 2018-08-31 DIAGNOSIS — E118 Type 2 diabetes mellitus with unspecified complications: Secondary | ICD-10-CM | POA: Diagnosis not present

## 2018-09-01 ENCOUNTER — Other Ambulatory Visit: Payer: Self-pay | Admitting: Cardiology

## 2018-09-02 ENCOUNTER — Emergency Department (HOSPITAL_COMMUNITY): Payer: Medicare Other

## 2018-09-02 ENCOUNTER — Observation Stay (HOSPITAL_COMMUNITY)
Admission: EM | Admit: 2018-09-02 | Discharge: 2018-09-04 | Disposition: A | Discharge status: Home / Self Care | Payer: Medicare Other | Attending: Internal Medicine | Admitting: Internal Medicine

## 2018-09-02 ENCOUNTER — Encounter (HOSPITAL_COMMUNITY): Payer: Self-pay | Admitting: Emergency Medicine

## 2018-09-02 DIAGNOSIS — R6883 Chills (without fever): Secondary | ICD-10-CM | POA: Insufficient documentation

## 2018-09-02 DIAGNOSIS — Z950 Presence of cardiac pacemaker: Secondary | ICD-10-CM | POA: Diagnosis not present

## 2018-09-02 DIAGNOSIS — R531 Weakness: Secondary | ICD-10-CM | POA: Diagnosis present

## 2018-09-02 DIAGNOSIS — I252 Old myocardial infarction: Secondary | ICD-10-CM | POA: Insufficient documentation

## 2018-09-02 DIAGNOSIS — Z7982 Long term (current) use of aspirin: Secondary | ICD-10-CM | POA: Diagnosis not present

## 2018-09-02 DIAGNOSIS — E782 Mixed hyperlipidemia: Secondary | ICD-10-CM | POA: Diagnosis present

## 2018-09-02 DIAGNOSIS — R05 Cough: Secondary | ICD-10-CM | POA: Insufficient documentation

## 2018-09-02 DIAGNOSIS — R079 Chest pain, unspecified: Secondary | ICD-10-CM | POA: Diagnosis not present

## 2018-09-02 DIAGNOSIS — Z955 Presence of coronary angioplasty implant and graft: Secondary | ICD-10-CM | POA: Diagnosis not present

## 2018-09-02 DIAGNOSIS — I2510 Atherosclerotic heart disease of native coronary artery without angina pectoris: Secondary | ICD-10-CM | POA: Diagnosis present

## 2018-09-02 DIAGNOSIS — E860 Dehydration: Principal | ICD-10-CM | POA: Insufficient documentation

## 2018-09-02 DIAGNOSIS — I959 Hypotension, unspecified: Secondary | ICD-10-CM | POA: Diagnosis not present

## 2018-09-02 DIAGNOSIS — Z79899 Other long term (current) drug therapy: Secondary | ICD-10-CM | POA: Insufficient documentation

## 2018-09-02 DIAGNOSIS — R778 Other specified abnormalities of plasma proteins: Secondary | ICD-10-CM | POA: Diagnosis present

## 2018-09-02 DIAGNOSIS — I495 Sick sinus syndrome: Secondary | ICD-10-CM

## 2018-09-02 DIAGNOSIS — N179 Acute kidney failure, unspecified: Secondary | ICD-10-CM | POA: Diagnosis not present

## 2018-09-02 DIAGNOSIS — Z8679 Personal history of other diseases of the circulatory system: Secondary | ICD-10-CM

## 2018-09-02 DIAGNOSIS — D696 Thrombocytopenia, unspecified: Secondary | ICD-10-CM | POA: Diagnosis not present

## 2018-09-02 DIAGNOSIS — R6889 Other general symptoms and signs: Secondary | ICD-10-CM | POA: Diagnosis present

## 2018-09-02 DIAGNOSIS — R5381 Other malaise: Secondary | ICD-10-CM | POA: Insufficient documentation

## 2018-09-02 DIAGNOSIS — Z87891 Personal history of nicotine dependence: Secondary | ICD-10-CM | POA: Insufficient documentation

## 2018-09-02 DIAGNOSIS — R7989 Other specified abnormal findings of blood chemistry: Secondary | ICD-10-CM | POA: Diagnosis not present

## 2018-09-02 DIAGNOSIS — I10 Essential (primary) hypertension: Secondary | ICD-10-CM | POA: Diagnosis not present

## 2018-09-02 DIAGNOSIS — I4510 Unspecified right bundle-branch block: Secondary | ICD-10-CM | POA: Diagnosis not present

## 2018-09-02 DIAGNOSIS — D7289 Other specified disorders of white blood cells: Secondary | ICD-10-CM | POA: Diagnosis not present

## 2018-09-02 DIAGNOSIS — R112 Nausea with vomiting, unspecified: Secondary | ICD-10-CM | POA: Diagnosis not present

## 2018-09-02 DIAGNOSIS — R0789 Other chest pain: Secondary | ICD-10-CM | POA: Diagnosis not present

## 2018-09-02 DIAGNOSIS — D649 Anemia, unspecified: Secondary | ICD-10-CM | POA: Diagnosis not present

## 2018-09-02 DIAGNOSIS — I25119 Atherosclerotic heart disease of native coronary artery with unspecified angina pectoris: Secondary | ICD-10-CM | POA: Insufficient documentation

## 2018-09-02 DIAGNOSIS — Z7902 Long term (current) use of antithrombotics/antiplatelets: Secondary | ICD-10-CM | POA: Diagnosis not present

## 2018-09-02 DIAGNOSIS — Z951 Presence of aortocoronary bypass graft: Secondary | ICD-10-CM | POA: Diagnosis not present

## 2018-09-02 DIAGNOSIS — E119 Type 2 diabetes mellitus without complications: Secondary | ICD-10-CM

## 2018-09-02 LAB — COMPREHENSIVE METABOLIC PANEL
ALT: 17 U/L (ref 0–44)
AST: 19 U/L (ref 15–41)
Albumin: 3.4 g/dL — ABNORMAL LOW (ref 3.5–5.0)
Alkaline Phosphatase: 52 U/L (ref 38–126)
Anion gap: 11 (ref 5–15)
BUN: 20 mg/dL (ref 8–23)
CO2: 19 mmol/L — ABNORMAL LOW (ref 22–32)
Calcium: 8.6 mg/dL — ABNORMAL LOW (ref 8.9–10.3)
Chloride: 105 mmol/L (ref 98–111)
Creatinine, Ser: 1.33 mg/dL — ABNORMAL HIGH (ref 0.61–1.24)
GFR calc Af Amer: 59 mL/min — ABNORMAL LOW (ref >60)
GFR, EST NON AFRICAN AMERICAN: 51 mL/min — AB (ref >60)
Glucose, Bld: 241 mg/dL — ABNORMAL HIGH (ref 70–99)
Potassium: 4 mmol/L (ref 3.5–5.1)
Sodium: 135 mmol/L (ref 135–145)
Total Bilirubin: 1.1 mg/dL (ref 0.3–1.2)
Total Protein: 5.9 g/dL — ABNORMAL LOW (ref 6.5–8.1)

## 2018-09-02 LAB — TROPONIN I
TROPONIN I: 0.06 ng/mL — AB (ref <0.03)
TROPONIN I: 0.07 ng/mL — AB (ref <0.03)
Troponin I: 0.08 ng/mL (ref <0.03)
Troponin I: 0.05 ng/mL (ref <0.03)
Troponin I: 0.05 ng/mL (ref <0.03)

## 2018-09-02 LAB — URINALYSIS, ROUTINE W REFLEX MICROSCOPIC
Bilirubin Urine: NEGATIVE
GLUCOSE, UA: 50 mg/dL — AB
Hgb urine dipstick: NEGATIVE
Ketones, ur: 5 mg/dL — AB
Leukocytes, UA: NEGATIVE
Nitrite: NEGATIVE
PH: 5 (ref 5.0–8.0)
Protein, ur: NEGATIVE mg/dL
Specific Gravity, Urine: 1.017 (ref 1.005–1.030)

## 2018-09-02 LAB — CBC WITH DIFFERENTIAL/PLATELET
Abs Immature Granulocytes: 0.09 10*3/uL — ABNORMAL HIGH (ref 0.00–0.07)
Abs Immature Granulocytes: 0.04 10*3/uL (ref 0.00–0.07)
Basophils Absolute: 0 10*3/uL (ref 0.0–0.1)
Basophils Absolute: 0 10*3/uL (ref 0.0–0.1)
Basophils Relative: 0 %
Basophils Relative: 0 %
Eosinophils Absolute: 0 10*3/uL (ref 0.0–0.5)
Eosinophils Absolute: 0 10*3/uL (ref 0.0–0.5)
Eosinophils Relative: 0 %
Eosinophils Relative: 0 %
HCT: 38.2 % — ABNORMAL LOW (ref 39.0–52.0)
HEMATOCRIT: 36.3 % — AB (ref 39.0–52.0)
Hemoglobin: 12.9 g/dL — ABNORMAL LOW (ref 13.0–17.0)
Hemoglobin: 12.4 g/dL — ABNORMAL LOW (ref 13.0–17.0)
Immature Granulocytes: 1 %
Immature Granulocytes: 1 %
LYMPHS ABS: 0.5 10*3/uL — AB (ref 0.7–4.0)
LYMPHS PCT: 6 %
Lymphocytes Relative: 3 %
Lymphs Abs: 0.3 10*3/uL — ABNORMAL LOW (ref 0.7–4.0)
MCH: 31.9 pg (ref 26.0–34.0)
MCH: 32.8 pg (ref 26.0–34.0)
MCHC: 33.8 g/dL (ref 30.0–36.0)
MCHC: 34.2 g/dL (ref 30.0–36.0)
MCV: 94.6 fL (ref 80.0–100.0)
MCV: 96 fL (ref 80.0–100.0)
Monocytes Absolute: 0.8 10*3/uL (ref 0.1–1.0)
Monocytes Absolute: 0.3 10*3/uL (ref 0.1–1.0)
Monocytes Relative: 7 %
Monocytes Relative: 4 %
Neutro Abs: 9.6 10*3/uL — ABNORMAL HIGH (ref 1.7–7.7)
Neutro Abs: 6.4 10*3/uL (ref 1.7–7.7)
Neutrophils Relative %: 89 %
Neutrophils Relative %: 89 %
Platelets: 123 10*3/uL — ABNORMAL LOW (ref 150–400)
Platelets: UNDETERMINED 10*3/uL (ref 150–400)
RBC: 4.04 MIL/uL — ABNORMAL LOW (ref 4.22–5.81)
RBC: 3.78 MIL/uL — ABNORMAL LOW (ref 4.22–5.81)
RDW: 11.9 % (ref 11.5–15.5)
RDW: 12.1 % (ref 11.5–15.5)
WBC Morphology: INCREASED
WBC: 10.9 10*3/uL — AB (ref 4.0–10.5)
WBC: 7.2 10*3/uL (ref 4.0–10.5)
nRBC: 0 % (ref 0.0–0.2)
nRBC: 0 % (ref 0.0–0.2)

## 2018-09-02 LAB — LACTIC ACID, PLASMA
Lactic Acid, Venous: 1.5 mmol/L (ref 0.5–1.9)
Lactic Acid, Venous: 1.4 mmol/L (ref 0.5–1.9)

## 2018-09-02 LAB — GLUCOSE, CAPILLARY
Glucose-Capillary: 125 mg/dL — ABNORMAL HIGH (ref 70–99)
Glucose-Capillary: 115 mg/dL — ABNORMAL HIGH (ref 70–99)
Glucose-Capillary: 115 mg/dL — ABNORMAL HIGH (ref 70–99)

## 2018-09-02 LAB — BRAIN NATRIURETIC PEPTIDE: B NATRIURETIC PEPTIDE 5: 67.9 pg/mL (ref 0.0–100.0)

## 2018-09-02 LAB — INFLUENZA PANEL BY PCR (TYPE A & B)
Influenza A By PCR: NEGATIVE
Influenza B By PCR: NEGATIVE

## 2018-09-02 MED ORDER — ACETAMINOPHEN 325 MG PO TABS: 650.0000 mg | ORAL_TABLET | Freq: Four times a day (QID) | ORAL | Status: DC | PRN

## 2018-09-02 MED ORDER — CLOPIDOGREL BISULFATE 75 MG PO TABS
75.0000 mg | ORAL_TABLET | Freq: Every day | ORAL | Status: DC
  Administered 2018-09-02 – 2018-09-04 (×3): 75 mg via ORAL
  Filled 2018-09-02 (×3): qty 1

## 2018-09-02 MED ORDER — ACETAMINOPHEN 650 MG RE SUPP: 650.0000 mg | Freq: Four times a day (QID) | RECTAL | Status: DC | PRN

## 2018-09-02 MED ORDER — ENOXAPARIN SODIUM 40 MG/0.4ML ~~LOC~~ SOLN
40.0000 mg | SUBCUTANEOUS | Status: DC
  Administered 2018-09-02 – 2018-09-04 (×3): 40 mg via SUBCUTANEOUS
  Filled 2018-09-02 (×3): qty 0.4

## 2018-09-02 MED ORDER — INSULIN ASPART 100 UNIT/ML ~~LOC~~ SOLN
0.0000 [IU] | Freq: Three times a day (TID) | SUBCUTANEOUS | Status: DC
  Administered 2018-09-02 – 2018-09-03 (×3): 1 [IU] via SUBCUTANEOUS

## 2018-09-02 MED ORDER — SODIUM CHLORIDE 0.9 % IV BOLUS
500.0000 mL | Freq: Once | INTRAVENOUS | Status: AC
  Administered 2018-09-02: 500 mL via INTRAVENOUS

## 2018-09-02 MED ORDER — SODIUM CHLORIDE 0.9 % IV SOLN
INTRAVENOUS | Status: DC
  Administered 2018-09-02 – 2018-09-03 (×4): via INTRAVENOUS

## 2018-09-02 MED ORDER — INSULIN ASPART 100 UNIT/ML ~~LOC~~ SOLN: 0.0000 [IU] | Freq: Every day | SUBCUTANEOUS | Status: DC

## 2018-09-02 MED ORDER — SIMVASTATIN 20 MG PO TABS
40.0000 mg | ORAL_TABLET | Freq: Every day | ORAL | Status: DC
  Administered 2018-09-02 – 2018-09-04 (×3): 40 mg via ORAL
  Filled 2018-09-02 (×4): qty 2

## 2018-09-02 MED ORDER — LATANOPROST 0.005 % OP SOLN
1.0000 [drp] | Freq: Every day | OPHTHALMIC | Status: DC
  Administered 2018-09-02 – 2018-09-03 (×2): 1 [drp] via OPHTHALMIC
  Filled 2018-09-02: qty 2.5

## 2018-09-02 MED ORDER — VITAMIN B-12 1000 MCG PO TABS
1000.0000 ug | ORAL_TABLET | Freq: Every day | ORAL | Status: DC
  Administered 2018-09-02 – 2018-09-04 (×3): 1000 ug via ORAL
  Filled 2018-09-02 (×3): qty 1

## 2018-09-02 MED ORDER — DORZOLAMIDE HCL-TIMOLOL MAL 2-0.5 % OP SOLN
1.0000 [drp] | Freq: Two times a day (BID) | OPHTHALMIC | Status: DC
  Administered 2018-09-02 – 2018-09-04 (×4): 1 [drp] via OPHTHALMIC
  Filled 2018-09-02: qty 10

## 2018-09-02 MED ORDER — ASPIRIN 81 MG PO CHEW
81.0000 mg | CHEWABLE_TABLET | Freq: Every day | ORAL | Status: DC
  Administered 2018-09-02 – 2018-09-04 (×3): 81 mg via ORAL
  Filled 2018-09-02 (×3): qty 1

## 2018-09-02 MED ORDER — METOPROLOL TARTRATE 25 MG PO TABS
25.0000 mg | ORAL_TABLET | Freq: Two times a day (BID) | ORAL | Status: DC
  Administered 2018-09-02 – 2018-09-04 (×4): 25 mg via ORAL
  Filled 2018-09-02 (×4): qty 1

## 2018-09-02 MED ORDER — NITROGLYCERIN 0.4 MG SL SUBL: 0.4000 mg | SUBLINGUAL_TABLET | SUBLINGUAL | Status: DC | PRN

## 2018-09-02 MED ORDER — BIOTIN 5000 MCG PO TABS: 5000.0000 ug | ORAL_TABLET | Freq: Every day | ORAL | Status: DC

## 2018-09-02 MED ORDER — GLIMEPIRIDE 2 MG PO TABS
2.0000 mg | ORAL_TABLET | Freq: Two times a day (BID) | ORAL | Status: DC
  Administered 2018-09-02 – 2018-09-04 (×4): 2 mg via ORAL
  Filled 2018-09-02 (×6): qty 1

## 2018-09-02 MED ORDER — SODIUM CHLORIDE 0.9 % IV BOLUS
1000.0000 mL | Freq: Once | INTRAVENOUS | Status: AC
  Administered 2018-09-02: 1000 mL via INTRAVENOUS

## 2018-09-02 NOTE — H&P (Signed)
History and Physical    Jesse [name] YWV:[id number] DOB: 07/15/1941 DOA: 09/02/2018  PCP: Deland Pretty, MD  Patient coming from: Home.  I have personally briefly reviewed patient's old medical records available.   Chief Complaint: Chills, not feeling well, dry cough.  HPI: Jesse [name] is a 78 y.o. male with medical history significant of coronary artery disease, hypertension, hyperlipidemia, diabetes who presented to the emergency room with vague complaints of not feeling well, nauseated, chills at home and generalized weakness for 2 days.  As per patient, he received shingles vaccine 3 days ago and since he went home, he felt weak and chilly but no fever.  Yesterday afternoon again he had similar episode, felt nauseated, had dry cough and some pressure sensation in the chest but no real chest pain.  Took nitroglycerin without any relief.  No fever or flulike symptoms.  Denies any productive cough.  Denies any sick contacts.  Denies any recent travel. Other than taking shingles vaccine no other change in medications.  He was sent at cardiology office 3 days ago with no symptoms.  Patient was worried because he had similar symptoms when he had non-STEMI in the past. ED Course: Patient was low normotensive with systolic blood pressure 85/46.  Given 2 L of fluid and blood pressures improving.  WBC count was normal.  Urinalysis was without evidence of infection.  Chest x-ray without evidence of infection.  Influenza swab was negative.  Lactate was normal.  Creatinine was slightly elevated, troponin was 0.07 and has remained flat.  EKG no ischemic changes.  Patient was given IV fluids.  Because of borderline elevated troponins and hypotension, admission was suggested.  Review of Systems: As per HPI otherwise 10 point review of systems negative.    Past Medical History:  Diagnosis Date  . CAD (coronary artery disease), native coronary artery 08/30/2018   2007 [place], [place]: LIMA to LAD, Occluded SVG  to RCA and OM by angiogram S/P  PCI/orbital atherectomy S/P 2.5x15 Resoluute to prox Cx on 11/29/2017.  Marland Kitchen Coronary artery disease   . High cholesterol   . Hypertension   . Myocardial infarction [facility]) 2017/2018?  . NSTEMI (non-ST elevated myocardial infarction) ([place]) 02/03/2015  . Presence of permanent cardiac pacemaker   . Type II diabetes mellitus ([place])     Past Surgical History:  Procedure Laterality Date  . CARDIAC CATHETERIZATION N/A 02/04/2015   Procedure: Left Heart Cath and Cors/Grafts Angiography;  Surgeon: Adrian Prows, MD;  Location: [place] [facility];  Service: Cardiovascular;  Laterality: N/A;  . CARDIAC CATHETERIZATION N/A 02/04/2015   Procedure: Coronary Stent Intervention;  Surgeon: Adrian Prows, MD;  Location: [place] [facility];  Service: Cardiovascular;  Laterality: N/A;  . CORONARY ANGIOGRAPHY N/A 10/18/2017   Procedure: CORONARY ANGIOGRAPHY;  Surgeon: Adrian Prows, MD;  Location: [place] [facility];  Service: Cardiovascular;  Laterality: N/A;  . CORONARY ANGIOPLASTY    . CORONARY ANGIOPLASTY WITH STENT PLACEMENT     "i've got 5-6 stents total" (08/30/2017)  . CORONARY ARTERY BYPASS GRAFT  2007   CABG X3  . CORONARY ATHERECTOMY N/A 10/18/2017   Procedure: CORONARY ATHERECTOMY;  Surgeon: Adrian Prows, MD;  Location: [place] [facility];  Service: Cardiovascular;  Laterality: N/A;  . CORONARY ATHERECTOMY N/A 11/29/2017   Procedure: CORONARY ATHERECTOMY;  Surgeon: Adrian Prows, MD;  Location: [place] [facility];  Service: Cardiovascular;  Laterality: N/A;  . CORONARY CTO INTERVENTION N/A 08/30/2017   Procedure: CORONARY CTO INTERVENTION;  Surgeon: Adrian Prows,  MD;  Location: [place] [facility];  Service: Cardiovascular;  Laterality: N/A;  . CORONARY STENT INTERVENTION N/A 11/29/2017   Procedure: CORONARY STENT INTERVENTION;  Surgeon: Adrian Prows, MD;  Location: [place] [facility];  Service: Cardiovascular;  Laterality: N/A;  . INSERT / REPLACE / REMOVE PACEMAKER  ?2012   "I think it was placed  in 2012"  . LEFT HEART CATH AND CORONARY ANGIOGRAPHY N/A 08/30/2017   Procedure: LEFT HEART CATH AND CORONARY ANGIOGRAPHY;  Surgeon: Adrian Prows, MD;  Location: [place] [facility];  Service: Cardiovascular;  Laterality: N/A;  . LEFT HEART CATH AND CORS/GRAFTS ANGIOGRAPHY N/A 08/05/2017   Procedure: LEFT HEART CATH AND CORS/GRAFTS ANGIOGRAPHY;  Surgeon: Adrian Prows, MD;  Location: [place] [facility];  Service: Cardiovascular;  Laterality: N/A;     reports that he quit smoking about 60 years ago. His smoking use included cigarettes. He quit after 3.00 years of use. He has never used smokeless tobacco. He reports that he does not drink alcohol or use drugs.  Allergies  Allergen Reactions  . Brilinta [Ticagrelor]     Difficulty breathing     Family History  Problem Relation Age of Onset  . Lung cancer Mother   . Stroke Father      Prior to Admission medications   Medication Sig Start Date End Date Taking? Authorizing Provider  aspirin 81 MG chewable tablet Chew 1 tablet (81 mg total) by mouth daily. 02/06/15  Yes Allie Bossier, MD  Biotin 5000 MCG TABS Take 5,000 mcg by mouth daily.   Yes [provider]  clopidogrel (PLAVIX) 75 MG tablet Take 75 mg by mouth daily. 08/18/18  Yes [provider]  dorzolamide-timolol (COSOPT) 22.3-6.8 MG/ML ophthalmic solution Place 1 drop into both eyes 2 (two) times daily. 05/22/17  Yes [provider]  glimepiride (AMARYL) 2 MG tablet Take 2 mg by mouth 2 (two) times daily.   Yes [provider]  latanoprost (XALATAN) 0.005 % ophthalmic solution Place 1 drop into both eyes at bedtime. 06/04/17  Yes [provider]  losartan (COZAAR) 25 MG tablet Take 25 mg by mouth daily.   Yes [provider]  metFORMIN (GLUCOPHAGE-XR) 500 MG 24 hr tablet Take 1 tablet (500 mg total) by mouth daily. 08/07/17  Yes Adrian Prows, MD  metoprolol tartrate (LOPRESSOR) 25 MG tablet Take 0.5 tablets (12.5 mg total) by mouth 3  (three) times daily. Patient taking differently: Take 25 mg by mouth 2 (two) times daily.  02/06/15  Yes Allie Bossier, MD  nitroGLYCERIN (NITROSTAT) 0.4 MG SL tablet Place 0.4 mg under the tongue every 5 (five) minutes as needed for chest pain.  07/27/17  Yes [provider]  Omega-3 Fatty Acids (FISH OIL PO) Take 1 capsule by mouth daily.   Yes [provider]  simvastatin (ZOCOR) 40 MG tablet Take 40 mg by mouth daily.   Yes [provider]  vitamin B-12 (CYANOCOBALAMIN) 1000 MCG tablet Take 1,000 mcg by mouth daily.   Yes [provider]  TRUE METRIX BLOOD GLUCOSE TEST test strip TEST 3 TIMES D 07/10/18   [provider]    Physical Exam: Vitals:   09/02/18 0500 09/02/18 0556 09/02/18 0715 09/02/18 0800  BP: (!) 93/58 97/61 97/60  93/60  Pulse: 67 68 73 73  Resp: 20 18 19  (!) 25  Temp:  97.9 F (36.6 C)    TempSrc:  Oral    SpO2: 97% 100% 97% 95%  Constitutional: NAD, calm, comfortable Vitals:   09/02/18 0500 09/02/18 0556 09/02/18 0715 09/02/18 0800  BP: (!) 93/58 97/61 97/60  93/60  Pulse: 67 68 73 73  Resp: 20 18 19  (!) 25  Temp:  97.9 F (36.6 C)    TempSrc:  Oral    SpO2: 97% 100% 97% 95%   Eyes: PERRL, lids and conjunctivae normal ENMT: Mucous membranes are moist. Posterior pharynx clear of any exudate or lesions.Normal dentition.  Neck: normal, supple, no masses, no thyromegaly Respiratory: clear to auscultation bilaterally, no wheezing, no crackles. Normal respiratory effort. No accessory muscle use.  Cardiovascular: Regular rate and rhythm, no murmurs / rubs / gallops. No extremity edema. 2+ pedal pulses. No carotid bruits.  Left precordial pacemaker intact.  Nontender. Abdomen: no tenderness, no masses palpated. No hepatosplenomegaly. Bowel sounds positive.  Musculoskeletal: no clubbing / cyanosis. No joint deformity upper and lower extremities. Good ROM, no contractures. Normal muscle tone.  Skin: no rashes, lesions,  ulcers. No induration Neurologic: CN 2-12 grossly intact. Sensation intact, DTR normal. Strength 5/5 in all 4.  Psychiatric: Normal judgment and insight. Alert and oriented x 3. Normal mood.     Labs on Admission: I have personally reviewed following labs and imaging studies  CBC: Recent Labs  Lab 09/02/18 0055 09/02/18 0648  WBC 10.9* 7.2  NEUTROABS 9.6* 6.4  HGB 12.9* 12.4*  HCT 38.2* 36.3*  MCV 94.6 96.0  PLT 123* PLATELET CLUMPS NOTED ON SMEAR, UNABLE TO ESTIMATE   Basic Metabolic Panel: Recent Labs  Lab 09/02/18 0055  NA 135  K 4.0  CL 105  CO2 19*  GLUCOSE 241*  BUN 20  CREATININE 1.33*  CALCIUM 8.6*   GFR: Estimated Creatinine Clearance: 53.2 mL/min (A) (by C-G formula based on SCr of 1.33 mg/dL (H)). Liver Function Tests: Recent Labs  Lab 09/02/18 0055  AST 19  ALT 17  ALKPHOS 52  BILITOT 1.1  PROT 5.9*  ALBUMIN 3.4*   No results for input(s): LIPASE, AMYLASE in the last 168 hours. No results for input(s): AMMONIA in the last 168 hours. Coagulation Profile: No results for input(s): INR, PROTIME in the last 168 hours. Cardiac Enzymes: Recent Labs  Lab 09/02/18 0055 09/02/18 0429  TROPONINI 0.07* 0.08*   BNP (last 3 results) No results for input(s): PROBNP in the last 8760 hours. HbA1C: No results for input(s): HGBA1C in the last 72 hours. CBG: No results for input(s): GLUCAP in the last 168 hours. Lipid Profile: No results for input(s): CHOL, HDL, LDLCALC, TRIG, CHOLHDL, LDLDIRECT in the last 72 hours. Thyroid Function Tests: No results for input(s): TSH, T4TOTAL, FREET4, T3FREE, THYROIDAB in the last 72 hours. Anemia Panel: No results for input(s): VITAMINB12, FOLATE, FERRITIN, TIBC, IRON, RETICCTPCT in the last 72 hours. Urine analysis:    Component Value Date/Time   COLORURINE YELLOW 09/02/2018 [place] 09/02/2018 0648   LABSPEC 1.017 09/02/2018 0648   PHURINE 5.0 09/02/2018 0648   GLUCOSEU 50 (A) 09/02/2018 0648     HGBUR NEGATIVE 09/02/2018 0648   BILIRUBINUR NEGATIVE 09/02/2018 0648   KETONESUR 5 (A) 09/02/2018 0648   PROTEINUR NEGATIVE 09/02/2018 0648   NITRITE NEGATIVE 09/02/2018 0648   LEUKOCYTESUR NEGATIVE 09/02/2018 0648    Radiological Exams on Admission: Dg Chest 2 View  Result Date: 09/02/2018 CLINICAL DATA:  Chest pain, cough EXAM: CHEST - 2 VIEW COMPARISON:  09/02/2017 FINDINGS: Left pacer remains in place, unchanged. Prior median sternotomy and CABG. No confluent airspace opacities or effusions. Heart  is upper limits normal in size. IMPRESSION: No active cardiopulmonary disease. Electronically Signed   By: Rolm Baptise M.D.   On: 09/02/2018 01:47    EKG: Independently reviewed.  Sinus rhythm.  His EKG is comparable to previous EKGs.  Assessment/Plan Principal Problem:   Troponin level elevated Active Problems:   S/P CABG x 3   Hx of sick sinus syndrome   Diabetes mellitus ([place])   Essential hypertension   Mixed hyperlipidemia   CAD (coronary artery disease), native coronary artery   Presence of permanent cardiac pacemaker     1.  Borderline elevated troponin with extensive history of coronary artery disease: No evidence of acute coronary syndrome.  No ongoing chest pain.  Probably type II injury from viral prodrome.  Agree with monitoring.  Patient will be kept on telemetry.  We will check his troponins and EKG to ensure stabilization. Resume dual antiplatelet therapy with aspirin and Plavix. Patient is on beta-blockers that we will continue.  Hold ACE inhibitor because of low blood pressures. Patient is on simvastatin that he will continue. Will be seen by cardiology, further recommendation as per them.  2.  Dehydration/viral prodromal-like syndrome: Influenza negative.  Afebrile now.  Evidence of dehydration with leukocytosis and acute renal insufficiency as well as low blood pressure. May be transient viral prodrome after live viral vaccination. Monitor.  Hydrate.   Recheck levels.  No indication for antibiotics.  3.  History of sick sinus syndrome: Status post pacemaker [employer].  Stable.  4.  Type 2 diabetes: On oral hypoglycemics at home.  Will continue.  Cover with sliding scale insulin.  5.  Essential hypertension: Low normotensive and hypotensive on arrival.  Resume beta-blocker with holding parameters to keep blood pressure more than 90.  Holding ACE inhibitor yesterday.  We will continue hydrate.  If blood pressure is adequate may resume tomorrow.    DVT prophylaxis: Lovenox subcu. Code Status: Full code. Family Communication: No family at bedside. Disposition Plan: Home anticipated tomorrow. Consults called: Cardiology. Admission status: Observation telemetry.   Barb Merino MD [facility] Pager [phone]  If 7PM-7AM, please contact night-coverage [email or web address] Password [facility]  09/02/2018, 9:36 AM

## 2018-09-02 NOTE — ED Provider Notes (Signed)
[place] EMERGENCY DEPARTMENT Provider Note   CSN: [id number] Arrival date & time: 09/02/18  0036     History   Chief Complaint Chief Complaint  Patient presents with  . Chest Pain    HPI Jesse [name] is a 78 y.o. male.  HPI 78 year old male with past medical history of coronary disease, hypertension, hyperlipidemia, diabetes, here with chest pain, nausea, vomiting, and generalized weakness.  The patient states he was in his usual state of health until this afternoon.  He experienced acute onset of chills, nausea, one episode of emesis, and a dull, pressure-like chest pain.  The chest pain has persisted.  He took nitroglycerin without significant relief.  He denies any associated shortness of breath.  States he was fine prior to the onset of symptoms.  He just saw his primary doctor, Dr. Einar Gip, yesterday and states he was doing well.  No medication changes.  Per EMS, he was mildly hypotensive in route.  He denies any known fevers.  No body aches or chills.  No other complaints.  Past Medical History:  Diagnosis Date  . CAD (coronary artery disease), native coronary artery 08/30/2018   2007 [place], [place]: LIMA to LAD, Occluded SVG to RCA and OM by angiogram S/P  PCI/orbital atherectomy S/P 2.5x15 Resoluute to prox Cx on 11/29/2017.  Marland Kitchen Coronary artery disease   . High cholesterol   . Hypertension   . Myocardial infarction [facility]) 2017/2018?  . NSTEMI (non-ST elevated myocardial infarction) ([place]) 02/03/2015  . Presence of permanent cardiac pacemaker   . Type II diabetes mellitus [facility])     Patient Active Problem List   Diagnosis Date Noted  . CAD (coronary artery disease), native coronary artery 08/30/2018  . Presence of permanent cardiac pacemaker 08/30/2018  . Essential hypertension   . Mixed hyperlipidemia   . 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 Post PTCA 02/03/2015  . Diabetes mellitus ([place]) 02/03/2015  . Hx of sick sinus syndrome   . 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     Past Surgical History:  Procedure Laterality Date  . CARDIAC CATHETERIZATION N/A 02/04/2015   Procedure: Left Heart Cath and Cors/Grafts Angiography;  Surgeon: Adrian Prows, MD;  Location: [place] [facility];  Service: Cardiovascular;  Laterality: N/A;  . CARDIAC CATHETERIZATION N/A 02/04/2015   Procedure: Coronary Stent Intervention;  Surgeon: Adrian Prows, MD;  Location: [place] [facility];  Service: Cardiovascular;  Laterality: N/A;  . CORONARY ANGIOGRAPHY N/A 10/18/2017   Procedure: CORONARY ANGIOGRAPHY;  Surgeon: Adrian Prows, MD;  Location: [place] [facility];  Service: Cardiovascular;  Laterality: N/A;  . CORONARY ANGIOPLASTY    . CORONARY ANGIOPLASTY WITH STENT PLACEMENT     "i've got 5-6 stents total" (08/30/2017)  . CORONARY ARTERY BYPASS GRAFT  2007   CABG X3  . CORONARY ATHERECTOMY N/A 10/18/2017   Procedure: CORONARY ATHERECTOMY;  Surgeon: Adrian Prows, MD;  Location: [place] [facility] LAB;  Service: Cardiovascular;  Laterality: N/A;  . CORONARY ATHERECTOMY N/A 11/29/2017   Procedure: CORONARY ATHERECTOMY;  Surgeon: Adrian Prows, MD;  Location: [place] [facility];  Service: Cardiovascular;  Laterality: N/A;  . CORONARY CTO INTERVENTION N/A 08/30/2017   Procedure: CORONARY CTO INTERVENTION;  Surgeon: Adrian Prows, MD;  Location: [place] [facility];  Service: Cardiovascular;  Laterality: N/A;  . CORONARY STENT INTERVENTION N/A 11/29/2017   Procedure: CORONARY STENT INTERVENTION;  Surgeon: Adrian Prows, MD;  Location: [place] [facility];  Service: Cardiovascular;  Laterality: N/A;  . INSERT /  REPLACE / REMOVE PACEMAKER  ?2012   "I think it was placed in 2012"  . LEFT HEART CATH AND CORONARY ANGIOGRAPHY N/A 08/30/2017   Procedure: LEFT HEART CATH AND CORONARY ANGIOGRAPHY;  Surgeon: Adrian Prows, MD;  Location: [place] [facility] LAB;  Service: Cardiovascular;  Laterality: N/A;  . LEFT HEART CATH AND CORS/GRAFTS ANGIOGRAPHY N/A 08/05/2017   Procedure: LEFT HEART CATH AND CORS/GRAFTS ANGIOGRAPHY;  Surgeon: Adrian Prows, MD;  Location: [place] [facility];  Service: Cardiovascular;  Laterality: N/A;        Home Medications    Prior to Admission medications   Medication Sig Start Date End Date Taking? Authorizing Provider  aspirin 81 MG chewable tablet Chew 1 tablet (81 mg total) by mouth daily. 02/06/15  Yes Allie Bossier, MD  Biotin 5000 MCG TABS Take 5,000 mcg by mouth daily.   Yes [provider]  clopidogrel (PLAVIX) 75 MG tablet Take 75 mg by mouth daily. 08/18/18  Yes [provider]  dorzolamide-timolol (COSOPT) 22.3-6.8 MG/ML ophthalmic solution Place 1 drop into both eyes 2 (two) times daily. 05/22/17  Yes [provider]  glimepiride (AMARYL) 2 MG tablet Take 2 mg by mouth 2 (two) times daily.   Yes [provider]  latanoprost (XALATAN) 0.005 % ophthalmic solution Place 1 drop into both eyes at bedtime. 06/04/17  Yes [provider]  losartan (COZAAR) 25 MG tablet Take 25 mg by mouth daily.   Yes [provider]  metFORMIN (GLUCOPHAGE-XR) 500 MG 24 hr tablet Take 1 tablet (500 mg total) by mouth daily. 08/07/17  Yes Adrian Prows, MD  metoprolol tartrate (LOPRESSOR) 25 MG tablet Take 0.5 tablets (12.5 mg total) by mouth 3 (three) times daily. Patient taking differently: Take 25 mg by mouth 2 (two) times daily.  02/06/15  Yes Allie Bossier, MD  nitroGLYCERIN (NITROSTAT) 0.4 MG SL tablet Place 0.4 mg under the tongue every 5 (five) minutes as needed for chest pain.  07/27/17  Yes [provider]  Omega-3 Fatty Acids (FISH OIL PO) Take 1 capsule by mouth daily.   Yes [provider]  simvastatin (ZOCOR) 40 MG tablet Take 40 mg by mouth daily.   Yes [provider]  vitamin B-12 (CYANOCOBALAMIN) 1000 MCG tablet Take 1,000 mcg by mouth daily.   Yes [provider]  ticagrelor (BRILINTA) 90 MG TABS tablet Take 1 tablet (90 mg total) by mouth 2 (two) times daily. Patient not taking: Reported on 08/30/2018 08/31/17    Nigel Mormon, MD  TRUE METRIX BLOOD GLUCOSE TEST test strip TEST 3 TIMES D 07/10/18   [provider]    Family History Family History  Problem Relation Age of Onset  . Lung cancer Mother   . Stroke Father     Social History Social History   Tobacco Use  . Smoking status: Former Smoker    Years: 3.00    Types: Cigarettes    Last attempt to quit: 1960    Years since quitting: 60.1  . Smokeless tobacco: Never Used  Substance Use Topics  . Alcohol use: No  . Drug use: No     Allergies   Brilinta [ticagrelor]   Review of Systems Review of Systems  Constitutional: Positive for chills and fatigue. Negative for fever.  HENT: Negative for congestion and rhinorrhea.   Eyes: Negative for visual disturbance.  Respiratory: Negative for cough, shortness of breath and wheezing.   Cardiovascular: Positive for chest pain. Negative for leg  swelling.  Gastrointestinal: Positive for nausea and vomiting. Negative for abdominal pain and diarrhea.  Genitourinary: Negative for dysuria and flank pain.  Musculoskeletal: Negative for neck pain and neck stiffness.  Skin: Negative for rash and wound.  Allergic/Immunologic: Negative for immunocompromised state.  Neurological: Positive for weakness. Negative for syncope and headaches.  All other systems reviewed and are negative.    Physical Exam Updated Vital Signs BP 97/61 (BP Location: Right Arm)   Pulse 68   Temp 97.9 F (36.6 C) (Oral)   Resp 18   SpO2 100%   Physical Exam Vitals signs and nursing note reviewed.  Constitutional:      General: He is not in acute distress.    Appearance: He is well-developed.  HENT:     Head: Normocephalic and atraumatic.  Eyes:     Conjunctiva/sclera: Conjunctivae normal.  Neck:     Musculoskeletal: Neck supple.  Cardiovascular:     Rate and Rhythm: Normal rate and regular rhythm.     Heart sounds: Normal heart sounds. No murmur. No friction rub.  Pulmonary:     Effort:  Pulmonary effort is normal. No respiratory distress.     Breath sounds: Normal breath sounds. No wheezing or rales.  Abdominal:     General: There is no distension.     Palpations: Abdomen is soft.     Tenderness: There is no abdominal tenderness.  Skin:    General: Skin is warm.     Capillary Refill: Capillary refill takes less than 2 seconds.  Neurological:     Mental Status: He is alert and oriented to person, place, and time.     Motor: No abnormal muscle tone.      ED Treatments / Results  Labs (all labs ordered are listed, but only abnormal results are displayed) Labs Reviewed  CBC WITH DIFFERENTIAL/PLATELET - Abnormal; Notable for the following components:      Result Value   WBC 10.9 (*)    RBC 4.04 (*)    Hemoglobin 12.9 (*)    HCT 38.2 (*)    Platelets 123 (*)    Neutro Abs 9.6 (*)    Lymphs Abs 0.3 (*)    Abs Immature Granulocytes 0.09 (*)    All other components within normal limits  COMPREHENSIVE METABOLIC PANEL - Abnormal; Notable for the following components:   CO2 19 (*)    Glucose, Bld 241 (*)    Creatinine, Ser 1.33 (*)    Calcium 8.6 (*)    Total Protein 5.9 (*)    Albumin 3.4 (*)    GFR calc non Af Amer 51 (*)    GFR calc Af Amer 59 (*)    All other components within normal limits  TROPONIN I - Abnormal; Notable for the following components:   Troponin I 0.07 (*)    All other components within normal limits  TROPONIN I - Abnormal; Notable for the following components:   Troponin I 0.08 (*)    All other components within normal limits  BRAIN NATRIURETIC PEPTIDE  LACTIC ACID, PLASMA  LACTIC ACID, PLASMA  INFLUENZA PANEL BY PCR (TYPE A & B)  URINALYSIS, ROUTINE W REFLEX MICROSCOPIC  CBC WITH DIFFERENTIAL/PLATELET    EKG EKG Interpretation  Date/Time:  Saturday September 02 2018 00:44:23 EST Ventricular Rate:  81 PR Interval:    QRS Duration: 145 QT Interval:  427 QTC Calculation: 496 R Axis:   -48 Text Interpretation:  Sinus rhythm  Nonspecific IVCD with LAD No significant  change since last tracing Confirmed by Duffy Bruce [phone]) on 09/02/2018 1:19:40 AM   Radiology Dg Chest 2 View  Result Date: 09/02/2018 CLINICAL DATA:  Chest pain, cough EXAM: CHEST - 2 VIEW COMPARISON:  09/02/2017 FINDINGS: Left pacer remains in place, unchanged. Prior median sternotomy and CABG. No confluent airspace opacities or effusions. Heart is upper limits normal in size. IMPRESSION: No active cardiopulmonary disease. Electronically Signed   By: Rolm Baptise M.D.   On: 09/02/2018 01:47    Procedures Procedures (including critical care time)  Medications Ordered in ED Medications  sodium chloride 0.9 % bolus 500 mL (0 mLs Intravenous Stopped 09/02/18 0227)  sodium chloride 0.9 % bolus 500 mL (0 mLs Intravenous Stopped 09/02/18 0457)  sodium chloride 0.9 % bolus 1,000 mL (0 mLs Intravenous Stopped 09/02/18 8871)     Initial Impression / Assessment and Plan / ED Course  I have reviewed the triage vital signs and the nursing notes.  Pertinent labs & imaging results that were available during my care of the patient were reviewed by me and considered in my medical decision making (see chart for details).     78 yo M here with transient nausea, emesis x 1, and chest pressure. Initial KEG non-ischemic. Vitals remarkable for mod hypotension but are otherwise unremarkable. Repeat trop remains elevated. Discussed with Dr. Einar Gip, pt's cardiologist - unlikely cardiac, but given hypotension which is abnormal for him, WBC elevation w/ left shift, and positive trop x 2, he is in agreement w/ plan for hosp obs. UA added on. No abd pain or TTP to suggest intra-abd surgical abnormality.  Final Clinical Impressions(s) / ED Diagnoses   Final diagnoses:  Elevated troponin  Left shift  Hypotension, unspecified hypotension type    ED Discharge Orders    None       Duffy Bruce, MD 09/02/18 [phone]

## 2018-09-02 NOTE — ED Notes (Signed)
Patient transported to X-ray 

## 2018-09-02 NOTE — ED Triage Notes (Signed)
Patient from home with chest pain that started at noon yesterday.  Patient stated that the pain increased during the day yesterday and into the night.  No shortness of breath.  Patient took 324mg  ASA and 2 SL nitro with no relief in his pain.  Patient is CAOx4.

## 2018-09-03 ENCOUNTER — Other Ambulatory Visit: Payer: Self-pay

## 2018-09-03 DIAGNOSIS — E860 Dehydration: Secondary | ICD-10-CM | POA: Diagnosis not present

## 2018-09-03 DIAGNOSIS — E782 Mixed hyperlipidemia: Secondary | ICD-10-CM | POA: Diagnosis not present

## 2018-09-03 DIAGNOSIS — N179 Acute kidney failure, unspecified: Secondary | ICD-10-CM | POA: Diagnosis not present

## 2018-09-03 DIAGNOSIS — I25118 Atherosclerotic heart disease of native coronary artery with other forms of angina pectoris: Secondary | ICD-10-CM

## 2018-09-03 DIAGNOSIS — Z950 Presence of cardiac pacemaker: Secondary | ICD-10-CM

## 2018-09-03 DIAGNOSIS — I10 Essential (primary) hypertension: Secondary | ICD-10-CM

## 2018-09-03 DIAGNOSIS — R7989 Other specified abnormal findings of blood chemistry: Secondary | ICD-10-CM | POA: Diagnosis not present

## 2018-09-03 DIAGNOSIS — Z8679 Personal history of other diseases of the circulatory system: Secondary | ICD-10-CM

## 2018-09-03 LAB — BASIC METABOLIC PANEL
Anion gap: 10 (ref 5–15)
BUN: 16 mg/dL (ref 8–23)
CO2: 19 mmol/L — AB (ref 22–32)
Calcium: 8.1 mg/dL — ABNORMAL LOW (ref 8.9–10.3)
Chloride: 108 mmol/L (ref 98–111)
Creatinine, Ser: 1.18 mg/dL (ref 0.61–1.24)
GFR calc Af Amer: >60 mL/min (ref >60)
GFR calc non Af Amer: 59 mL/min — ABNORMAL LOW (ref >60)
Glucose, Bld: 114 mg/dL — ABNORMAL HIGH (ref 70–99)
Potassium: 3.9 mmol/L (ref 3.5–5.1)
Sodium: 137 mmol/L (ref 135–145)

## 2018-09-03 LAB — CBC
HCT: 37.4 % — ABNORMAL LOW (ref 39.0–52.0)
Hemoglobin: 12.3 g/dL — ABNORMAL LOW (ref 13.0–17.0)
MCH: 31.5 pg (ref 26.0–34.0)
MCHC: 32.9 g/dL (ref 30.0–36.0)
MCV: 95.7 fL (ref 80.0–100.0)
Platelets: DECREASED 10*3/uL (ref 150–400)
RBC: 3.91 MIL/uL — ABNORMAL LOW (ref 4.22–5.81)
RDW: 12.1 % (ref 11.5–15.5)
WBC: 4.2 10*3/uL (ref 4.0–10.5)
nRBC: 0 % (ref 0.0–0.2)

## 2018-09-03 LAB — GLUCOSE, CAPILLARY
GLUCOSE-CAPILLARY: 131 mg/dL — AB (ref 70–99)
GLUCOSE-CAPILLARY: 145 mg/dL — AB (ref 70–99)
GLUCOSE-CAPILLARY: 160 mg/dL — AB (ref 70–99)
Glucose-Capillary: 91 mg/dL (ref 70–99)

## 2018-09-03 NOTE — Plan of Care (Signed)
  Problem: Health Behavior/Discharge Planning: Goal: Ability to manage health-related needs will improve Outcome: Progressing   Problem: Activity: Goal: Risk for activity intolerance will decrease Outcome: Progressing   Problem: Safety: Goal: Ability to remain free from injury will improve Outcome: Progressing   

## 2018-09-03 NOTE — Plan of Care (Signed)
  Problem: Activity: Goal: Risk for activity intolerance will decrease Outcome: Progressing   Problem: Pain Managment: Goal: General experience of comfort will improve Outcome: Progressing   Problem: Safety: Goal: Ability to remain free from injury will improve Outcome: Progressing   

## 2018-09-04 DIAGNOSIS — R7989 Other specified abnormal findings of blood chemistry: Secondary | ICD-10-CM | POA: Diagnosis not present

## 2018-09-04 DIAGNOSIS — I10 Essential (primary) hypertension: Secondary | ICD-10-CM | POA: Diagnosis not present

## 2018-09-04 DIAGNOSIS — Z8679 Personal history of other diseases of the circulatory system: Secondary | ICD-10-CM | POA: Diagnosis not present

## 2018-09-04 DIAGNOSIS — I959 Hypotension, unspecified: Secondary | ICD-10-CM

## 2018-09-04 DIAGNOSIS — E119 Type 2 diabetes mellitus without complications: Secondary | ICD-10-CM | POA: Diagnosis not present

## 2018-09-04 DIAGNOSIS — Z951 Presence of aortocoronary bypass graft: Secondary | ICD-10-CM

## 2018-09-04 DIAGNOSIS — E782 Mixed hyperlipidemia: Secondary | ICD-10-CM | POA: Diagnosis not present

## 2018-09-04 DIAGNOSIS — Z950 Presence of cardiac pacemaker: Secondary | ICD-10-CM | POA: Diagnosis not present

## 2018-09-04 DIAGNOSIS — R6889 Other general symptoms and signs: Secondary | ICD-10-CM | POA: Diagnosis not present

## 2018-09-04 DIAGNOSIS — E860 Dehydration: Secondary | ICD-10-CM | POA: Diagnosis not present

## 2018-09-04 DIAGNOSIS — I25118 Atherosclerotic heart disease of native coronary artery with other forms of angina pectoris: Secondary | ICD-10-CM | POA: Diagnosis not present

## 2018-09-04 LAB — GLUCOSE, CAPILLARY: Glucose-Capillary: 96 mg/dL (ref 70–99)

## 2018-09-04 NOTE — Progress Notes (Addendum)
Progress Note    Jesse [name]  XUX:[id number] DOB: 11-12-1940  DOA: 09/02/2018 PCP: Deland Pretty, MD    Brief Narrative:   Chief complaint: Chills and malaise   Medical records reviewed and are as summarized below:  Jesse [name] is an 78 y.o. male with pmh CAD, HTN, HLD, and DM type II; who presented with complaints of malaise, nausea, chills, and generalized weakness for the last 2 days after receiving his shingles vaccine.  Assessment/Plan:   Principal Problem:   Troponin level elevated Active Problems:   S/P CABG x 3   Hx of sick sinus syndrome   Diabetes mellitus (HCC)   Essential hypertension   Mixed hyperlipidemia   CAD (coronary artery disease), native coronary artery   Presence of permanent cardiac pacemaker  Dehydration 2/2 viral prodrome/reaction to vaccine: Influenza screen was negative.  Patient afebrile now and chest x-ray showed no acute abnormality.  May be a transient viral prodrome after receiving live shingles vaccination.   -Continue IV fluids -Monitor off on antibiotics  Elevated troponin, history of coronary artery disease: Troponin trended up to 0.08, but appears to be trending downward. Patient denies any complaints of chest pain at this time.  No need for further cardiac work-up at this time -Continue to monitor  Acute kidney injury: Resolving.  On admission creatinine elevated up to 1.33 with BUN 20.  Recheck creatinine 1.18. -Continue IV fluids -Repeat BMP in a.m.  Essential hypertension: Patient blood pressures low normal.  Patient he was restarted on beta-blocker with holding parameters for systolic blood pressures less than 90.  Patient was still noted to be orthostatic on hospital day 2. -Recheck orthostatics -Hold ACE inhibitor, may restart if kidney function improved in a.m. -Continue beta-blocker  Leukocytosis: Resolved.  History of sick sinus syndrome: Stable patient status post Medtronic pacemaker. -Continue to  monitor    There is no height or weight on file to calculate BMI.   Family Communication/Anticipated D/C date and plan/Code Status   DVT prophylaxis: Lovenox ordered. Code Status: Full Code.  Family Communication:Discussed plan of care with patient and family present at bedside Disposition Plan: Likely discharge home tomorrow   Medical Consultants:    None.   Anti-Infectives:    None  Subjective:   Patient states that he is starting to feel better, but his blood pressure  Objective:    Vitals:   09/03/18 1300 09/03/18 1407 09/03/18 1408 09/04/18 0004  BP: 137/72 137/72 137/68 113/67  Pulse: 80 76 79 68  Resp: 18 16 16 16   Temp: 97.8 F (36.6 C) 98 F (36.7 C) 98.1 F (36.7 C) 98.1 F (36.7 C)  TempSrc: Oral Oral Oral Oral  SpO2: 96% 97% 97% 98%    Intake/Output Summary (Last 24 hours) at 09/04/2018 0430 Last data filed at 09/04/2018 0300 Gross per 24 hour  Intake 4088.27 ml  Output 200 ml  Net 3888.27 ml    Exam: Constitutional: Elderly male in NAD, calm, comfortable Eyes: PERRL, lids and conjunctivae normal ENMT: Mucous membranes are moist. Posterior pharynx clear of any exudate or lesions. Neck: normal, supple, no masses, no thyromegaly Respiratory: clear to auscultation bilaterally, no wheezing, no crackles. Normal respiratory effort. No accessory muscle use.  Cardiovascular: Regular rate and rhythm, no murmurs / rubs / gallops. No extremity edema. 2+ pedal pulses. No carotid bruits.  Abdomen: no tenderness, no masses palpated. No hepatosplenomegaly. Bowel sounds positive.  Musculoskeletal: no clubbing / cyanosis. No joint deformity upper and lower extremities. Good ROM,  no contractures. Normal muscle tone.  Skin: no rashes, lesions, ulcers. No induration Neurologic: CN 2-12 grossly intact. Sensation intact, DTR normal. Strength 5/5 in all 4.  Psychiatric: Normal judgment and insight. Alert and oriented x 3. Normal mood.    Data Reviewed:   I  have personally reviewed following labs and imaging studies:  Labs: Labs show the following:   Basic Metabolic Panel: Recent Labs  Lab 09/02/18 0055 09/03/18 0546  NA 135 137  K 4.0 3.9  CL 105 108  CO2 19* 19*  GLUCOSE 241* 114*  BUN 20 16  CREATININE 1.33* 1.18  CALCIUM 8.6* 8.1*   GFR Estimated Creatinine Clearance: 60 mL/min (by C-G formula based on SCr of 1.18 mg/dL). Liver Function Tests: Recent Labs  Lab 09/02/18 0055  AST 19  ALT 17  ALKPHOS 52  BILITOT 1.1  PROT 5.9*  ALBUMIN 3.4*   No results for input(s): LIPASE, AMYLASE in the last 168 hours. No results for input(s): AMMONIA in the last 168 hours. Coagulation profile No results for input(s): INR, PROTIME in the last 168 hours.  CBC: Recent Labs  Lab 09/02/18 0055 09/02/18 0648 09/03/18 0546  WBC 10.9* 7.2 4.2  NEUTROABS 9.6* 6.4  --   HGB 12.9* 12.4* 12.3*  HCT 38.2* 36.3* 37.4*  MCV 94.6 96.0 95.7  PLT 123* PLATELET CLUMPS NOTED ON SMEAR, UNABLE TO ESTIMATE PLATELET CLUMPS NOTED ON SMEAR, COUNT APPEARS DECREASED   Cardiac Enzymes: Recent Labs  Lab 09/02/18 0055 09/02/18 0429 09/02/18 1108 09/02/18 1637 09/02/18 2304  TROPONINI 0.07* 0.08* 0.06* 0.05* 0.05*   BNP (last 3 results) No results for input(s): PROBNP in the last 8760 hours. CBG: Recent Labs  Lab 09/02/18 2135 09/03/18 0735 09/03/18 1154 09/03/18 1634 09/03/18 2124  GLUCAP 115* 91 131* 145* 160*   D-Dimer: No results for input(s): DDIMER in the last 72 hours. Hgb A1c: No results for input(s): HGBA1C in the last 72 hours. Lipid Profile: No results for input(s): CHOL, HDL, LDLCALC, TRIG, CHOLHDL, LDLDIRECT in the last 72 hours. Thyroid function studies: No results for input(s): TSH, T4TOTAL, T3FREE, THYROIDAB in the last 72 hours.  Invalid input(s): FREET3 Anemia work up: No results for input(s): VITAMINB12, FOLATE, FERRITIN, TIBC, IRON, RETICCTPCT in the last 72 hours. Sepsis Labs: Recent Labs  Lab  09/02/18 0055 09/02/18 0220 09/02/18 0429 09/02/18 0648 09/03/18 0546  WBC 10.9*  --   --  7.2 4.2  LATICACIDVEN  --  1.5 1.4  --   --     Microbiology No results found for this or any previous visit (from the past 240 hour(s)).  Procedures and diagnostic studies:  No results found.  Medications:   . aspirin  81 mg Oral Daily  . clopidogrel  75 mg Oral Daily  . dorzolamide-timolol  1 drop Both Eyes BID  . enoxaparin (LOVENOX) injection  40 mg Subcutaneous Q24H  . glimepiride  2 mg Oral BID  . insulin aspart  0-5 Units Subcutaneous QHS  . insulin aspart  0-9 Units Subcutaneous TID WC  . latanoprost  1 drop Both Eyes QHS  . metoprolol tartrate  25 mg Oral BID  . simvastatin  40 mg Oral Daily  . vitamin B-12  1,000 mcg Oral Daily   Continuous Infusions: . sodium chloride 100 mL/hr at 09/03/18 1659     LOS: 0 days   Keisha Amer A Decarla Siemen  [facility]   *Please refer to [email or web address], password [email or web address] to get updated schedule on who will round  on this patient, as hospitalists switch teams weekly. If 7PM-7AM, please contact night-coverage at [email or web address], password [email or web address] for any overnight needs.

## 2018-09-04 NOTE — Progress Notes (Signed)
Pt discharged to home. IV's discontinued. Patient teaching completed.

## 2018-09-04 NOTE — Discharge Summary (Signed)
Jesse [name], is a 78 y.o. male  DOB 05-11-1941  MRN [id number].  Admission date:  09/02/2018  Admitting Physician  Barb Merino, MD  Discharge Date:  09/05/2018   Primary MD  Deland Pretty, MD  Recommendations for primary care physician for things to follow:       Discharge Diagnosis   Principal Problem:   Flu-like symptoms Active Problems:   S/P CABG x 3   Hx of sick sinus syndrome   Diabetes mellitus ([place])   Essential hypertension   Mixed hyperlipidemia   CAD (coronary artery disease), native coronary artery   Presence of permanent cardiac pacemaker   Troponin level elevated      Past Medical History:  Diagnosis Date  . CAD (coronary artery disease), native coronary artery 08/30/2018   2007 [place], [place]: LIMA to LAD, Occluded SVG to RCA and OM by angiogram S/P  PCI/orbital atherectomy S/P 2.5x15 Resoluute to prox Cx on 11/29/2017.  Marland Kitchen Coronary artery disease   . High cholesterol   . Hypertension   . Myocardial infarction [facility]) 2017/2018?  . NSTEMI (non-ST elevated myocardial infarction) ([place]) 02/03/2015  . Presence of permanent cardiac pacemaker   . Type II diabetes mellitus ([place])     Past Surgical History:  Procedure Laterality Date  . CARDIAC CATHETERIZATION N/A 02/04/2015   Procedure: Left Heart Cath and Cors/Grafts Angiography;  Surgeon: Adrian Prows, MD;  Location: [place] [facility];  Service: Cardiovascular;  Laterality: N/A;  . CARDIAC CATHETERIZATION N/A 02/04/2015   Procedure: Coronary Stent Intervention;  Surgeon: Adrian Prows, MD;  Location: [place] [facility];  Service: Cardiovascular;  Laterality: N/A;  . CORONARY ANGIOGRAPHY N/A 10/18/2017   Procedure: CORONARY ANGIOGRAPHY;  Surgeon: Adrian Prows, MD;  Location: [place] [facility];  Service: Cardiovascular;  Laterality: N/A;  . CORONARY ANGIOPLASTY    .  CORONARY ANGIOPLASTY WITH STENT PLACEMENT     "i've got 5-6 stents total" (08/30/2017)  . CORONARY ARTERY BYPASS GRAFT  2007   CABG X3  . CORONARY ATHERECTOMY N/A 10/18/2017   Procedure: CORONARY ATHERECTOMY;  Surgeon: Adrian Prows, MD;  Location: [place] [facility];  Service: Cardiovascular;  Laterality: N/A;  . CORONARY ATHERECTOMY N/A 11/29/2017   Procedure: CORONARY ATHERECTOMY;  Surgeon: Adrian Prows, MD;  Location: [place] [facility];  Service: Cardiovascular;  Laterality: N/A;  . CORONARY CTO INTERVENTION N/A 08/30/2017   Procedure: CORONARY CTO INTERVENTION;  Surgeon: Adrian Prows, MD;  Location: [place] [facility];  Service: Cardiovascular;  Laterality: N/A;  . CORONARY STENT INTERVENTION N/A 11/29/2017   Procedure: CORONARY STENT INTERVENTION;  Surgeon: Adrian Prows, MD;  Location: [place] [facility];  Service: Cardiovascular;  Laterality: N/A;  . INSERT / REPLACE / REMOVE PACEMAKER  ?2012   "I think it was placed in 2012"  . LEFT HEART CATH AND CORONARY ANGIOGRAPHY N/A 08/30/2017   Procedure: LEFT HEART CATH AND CORONARY ANGIOGRAPHY;  Surgeon: Adrian Prows, MD;  Location: [place] [facility];  Service: Cardiovascular;  Laterality: N/A;  . LEFT HEART  CATH AND CORS/GRAFTS ANGIOGRAPHY N/A 08/05/2017   Procedure: LEFT HEART CATH AND CORS/GRAFTS ANGIOGRAPHY;  Surgeon: Adrian Prows, MD;  Location: [place] [facility];  Service: Cardiovascular;  Laterality: N/A;       HPI  from the history and physical done on the day of admission:    Jesse [name] is a 78 y.o. male with medical history significant of coronary artery disease, hypertension, hyperlipidemia, diabetes who presented to the emergency room with vague complaints of not feeling well, nauseated, chills at home and generalized weakness for 2 days.  As per patient, he received shingles vaccine 3 days ago and since he went home, he felt weak and chilly but no fever.  Yesterday afternoon again he had similar episode, felt nauseated, had dry cough and some  pressure sensation in the chest but no real chest pain.  Took nitroglycerin without any relief.  No fever or flulike symptoms.  Denies any productive cough.  Denies any sick contacts.  Denies any recent travel.  Other than taking shingles vaccine no other change in medications.  He was sent at cardiology office 3 days ago with no symptoms.  Patient was worried because he had similar symptoms when he had non-STEMI in the past.  ED Course: Patient was low normotensive with systolic blood pressure 63/89.  Given 2 L of fluid and blood pressures improving.  WBC count was normal.  Urinalysis was without evidence of infection.  Chest x-ray without evidence of infection.  Influenza swab was negative.  Lactate was normal.  Creatinine was slightly elevated, troponin was 0.07 and has remained flat.  EKG no ischemic changes.  Patient was given IV fluids.  Because of borderline elevated troponins and hypotension, admission was suggested.     Hospital Course:  1.  Dehydration 2/2 viral prodrome/reaction to vaccine: Patient presented with complaints of malaise, nausea, dry cough, and chills.  Afebrile on arrival to the hospital. chest x-ray showed no acute abnormality.   Influenza screen was negative. May be a transient viral prodrome after receiving live shingles vaccination.    He was monitored off antibiotics and given IV fluids with improvement in symptoms.  2.  Elevated troponin, history of coronary artery disease: Patient with previous history of three-vessel CABG, and CSI atherectomy of the ostial and proximal circumflex coronary artery followed by drug-eluting stent placement on 11/29/2017 by Dr. Einar Gip.  He initially had noted some chest discomfort prior to arrival.  Troponins trended up to 0.08, but appears to be trending downward in the setting of acute kidney injury. Patient denies any complaints of chest pain at this time.  Suspect related with demand ischemia. No need for further cardiac work-up at this  time.  He was advised to follow-up with his cardiologist Dr. Einar Gip in the outpatient setting.  3.  Acute kidney injury: Resolving.  On admission creatinine elevated up to 1.33 with BUN 20.  After IV fluids creatinine 1.18 prior to discharge.  4.  Essential hypertension: Patient blood pressures low normal.  Patient he was restarted on beta-blocker with holding parameters for systolic blood pressures less than 90.  Patient was still noted to be orthostatic on hospital day 2.  Held patient's ACE inhibitor due to acute kidney injury, but he was continued on the beta-blocker as tolerated.  Monitor patient overnight with continuation of IV fluids.  On hospital day 3 blood pressures improved and patient was able to be discharged home.  5.  Leukocytosis: Resolved.  Initial white blood cell count elevated to  10.9.  Suspect that this was related with recent vaccine given or viral illness.  6.  Normocytic normochromic anemia: Hemoglobin remained stable around 12 during his hospital stay.  7.  History of sick sinus syndrome: Stable patient status post Medtronic pacemaker. -Continue to monitor  8.  Thrombocytopenia: Chronic. Platelet count 123.  No reports of bleeding noted.   Follow UP  Follow-up Information    Deland Pretty, MD On 09/11/2018.   Specialty:  Internal Medicine Why:  At 10:30am for hospital follow up  Contact information: [address] [place] [id number] [phone]            Consults obtained -cardiology consultation was canceled.  Discharge Condition: Stable  Diet and Activity recommendation: See Discharge Instructions below   Discharge Instructions    Discharge instructions   Complete by:  As directed    We suspect that your symptoms were related mild virus like syndrome after receiving the shingles vaccine.  You are noted to be dehydrated so drink plenty of fluids.  Otherwise continue your medications as previously prescribed.  Please call and  arrange follow-up with your primary care provider in 1 week to have a repeat blood work of CBC and BMP.  Also please follow-up with your cardiologist Dr. Einar Gip regarding recent hospitalization with elevated heart enzymes.   Increase activity slowly   Complete by:  As directed         Discharge Medications     Allergies as of 09/04/2018      Reactions   Brilinta [ticagrelor]    Difficulty breathing       Medication List    TAKE these medications   aspirin 81 MG chewable tablet Chew 1 tablet (81 mg total) by mouth daily.   Biotin 5000 MCG Tabs Take 5,000 mcg by mouth daily.   clopidogrel 75 MG tablet Commonly known as:  PLAVIX Take 75 mg by mouth daily.   dorzolamide-timolol 22.3-6.8 MG/ML ophthalmic solution Commonly known as:  COSOPT Place 1 drop into both eyes 2 (two) times daily.   FISH OIL PO Take 1 capsule by mouth daily.   glimepiride 2 MG tablet Commonly known as:  AMARYL Take 2 mg by mouth 2 (two) times daily.   latanoprost 0.005 % ophthalmic solution Commonly known as:  XALATAN Place 1 drop into both eyes at bedtime.   losartan 25 MG tablet Commonly known as:  COZAAR Take 25 mg by mouth daily.   metFORMIN 500 MG 24 hr tablet Commonly known as:  GLUCOPHAGE-XR Take 1 tablet (500 mg total) by mouth daily.   metoprolol tartrate 25 MG tablet Commonly known as:  LOPRESSOR Take 0.5 tablets (12.5 mg total) by mouth 3 (three) times daily. What changed:    how much to take  when to take this   nitroGLYCERIN 0.4 MG SL tablet Commonly known as:  NITROSTAT Place 0.4 mg under the tongue every 5 (five) minutes as needed for chest pain.   simvastatin 40 MG tablet Commonly known as:  ZOCOR Take 40 mg by mouth daily.   TRUE METRIX BLOOD GLUCOSE TEST test strip Generic drug:  glucose blood TEST 3 TIMES D   vitamin B-12 1000 MCG tablet Commonly known as:  CYANOCOBALAMIN Take 1,000 mcg by mouth daily.       Major procedures and Radiology Reports -  PLEASE review detailed and final reports for all details, in brief -     Dg Chest 2 View  Result Date: 09/02/2018 CLINICAL DATA:  Chest pain, cough EXAM: CHEST - 2 VIEW COMPARISON:  09/02/2017 FINDINGS: Left pacer remains in place, unchanged. Prior median sternotomy and CABG. No confluent airspace opacities or effusions. Heart is upper limits normal in size. IMPRESSION: No active cardiopulmonary disease. Electronically Signed   By: Rolm Baptise M.D.   On: 09/02/2018 01:47    Micro Results     No results found for this or any previous visit (from the past 240 hour(s)).     Today   Subjective    Jesse [name] today states that he feels much improved, and notes that his cough is decreased.   Objective   Blood pressure (!) 112/54, pulse 85, temperature 97.7 F (36.5 C), temperature source Oral, resp. rate 17, SpO2 97 %.  No intake or output data in the 24 hours ending 09/05/18 0442  Exam  Constitutional: Elderly male in NAD, calm, comfortable Eyes: PERRL, lids and conjunctivae normal ENMT: Mucous membranes are moist. Posterior pharynx clear of any exudate or lesions.  Neck: normal, supple, no masses, no thyromegaly Respiratory: clear to auscultation bilaterally, no wheezing, no crackles. Normal respiratory effort. No accessory muscle use.  Cardiovascular: Regular rate and rhythm, no murmurs / rubs / gallops. No extremity edema. 2+ pedal pulses. No carotid bruits.  Abdomen: no tenderness, no masses palpated. No hepatosplenomegaly. Bowel sounds positive.  Musculoskeletal: no clubbing / cyanosis. No joint deformity upper and lower extremities. Good ROM, no contractures. Normal muscle tone.  Skin: no rashes, lesions, ulcers. No induration Neurologic: CN 2-12 grossly intact. Sensation intact, DTR normal. Strength 5/5 in all 4.  Psychiatric: Normal judgment and insight. Alert and oriented x 3. Normal mood.    Data Review   CBC w Diff:  Lab Results  Component Value Date   WBC  4.2 09/03/2018   HGB 12.3 (L) 09/03/2018   HCT 37.4 (L) 09/03/2018   PLT  09/03/2018    PLATELET CLUMPS NOTED ON SMEAR, COUNT APPEARS DECREASED   LYMPHOPCT 6 09/02/2018   MONOPCT 4 09/02/2018   EOSPCT 0 09/02/2018   BASOPCT 0 09/02/2018    CMP:  Lab Results  Component Value Date   NA 137 09/03/2018   K 3.9 09/03/2018   CL 108 09/03/2018   CO2 19 (L) 09/03/2018   BUN 16 09/03/2018   CREATININE 1.18 09/03/2018   PROT 5.9 (L) 09/02/2018   ALBUMIN 3.4 (L) 09/02/2018   BILITOT 1.1 09/02/2018   ALKPHOS 52 09/02/2018   AST 19 09/02/2018   ALT 17 09/02/2018  .   Total Time in preparing paper work, data evaluation and todays exam - 35 minutes  Norval Morton M.D on 09/05/2018 at 4:42 AM  [facility]   Office  [phone]

## 2018-09-04 NOTE — Progress Notes (Addendum)
Patient identified by nurse secretary as chest pain unit consult. Patient appears to be followed by Dr. Einar Gip. Progress note signed this morning appears to indicate no need for further cardiac workup at this time - If cardiology consultation is needed, please contact Dr. Irven Shelling office. Anton Cheramie PA-C [facility]

## 2018-09-07 ENCOUNTER — Ambulatory Visit (INDEPENDENT_AMBULATORY_CARE_PROVIDER_SITE_OTHER): Payer: Medicare Other | Admitting: Cardiology

## 2018-09-07 VITALS — BP 138/68 | HR 67 | Ht 70.0 in | Wt 201.7 lb

## 2018-09-07 DIAGNOSIS — I480 Paroxysmal atrial fibrillation: Secondary | ICD-10-CM

## 2018-09-07 DIAGNOSIS — I495 Sick sinus syndrome: Secondary | ICD-10-CM | POA: Diagnosis not present

## 2018-09-07 DIAGNOSIS — Z8679 Personal history of other diseases of the circulatory system: Secondary | ICD-10-CM

## 2018-09-07 DIAGNOSIS — I25810 Atherosclerosis of coronary artery bypass graft(s) without angina pectoris: Secondary | ICD-10-CM

## 2018-09-07 DIAGNOSIS — G933 Postviral fatigue syndrome: Secondary | ICD-10-CM | POA: Diagnosis not present

## 2018-09-07 DIAGNOSIS — I2510 Atherosclerotic heart disease of native coronary artery without angina pectoris: Secondary | ICD-10-CM

## 2018-09-07 NOTE — Progress Notes (Addendum)
Virtual Visit via Telephone Note: Patient unable to use video assisted device.  This visit type was conducted due to national recommendations for restrictions regarding the COVID-19 Pandemic (e.g. social distancing).  This format is felt to be most appropriate for this patient at this time.  All issues noted in this document were discussed and addressed.  No physical exam was performed.  The patient has consented to conduct a Telehealth visit and understands insurance will be billed.   I connected with@, on 12/15/18 at  by TELEPHONE and verified that I am speaking with the correct person using two identifiers.   I discussed the limitations of evaluation and management by telemedicine and the availability of in person appointments. The patient expressed understanding and agreed to proceed.   I have discussed with patient regarding the safety during COVID Pandemic and steps and precautions to be taken including social distancing, frequent hand wash and use of detergent soap, gels with the patient. I asked the patient to avoid touching mouth, nose, eyes, ears with the hands. I encouraged regular walking around the neighborhood and exercise and regular diet, as long as social distancing can be maintained.  Primary Physician/Referring:  Deland Pretty, MD  Patient ID: Jesse [name], male    DOB: May 27, 1941, 78 y.o.   MRN: [id number]  Chief Complaint  Patient presents with  . Chest Pain  . Follow-up    HPI: Jesse [name]  is a 78 y.o. male  with coronary artery disease and CABG in 2007 in [place] with LIMA to LAD, SVG to OM which is occluded by angiography on 02/04/2015, occluded SVG to RCA. He underwent complex successful arthrectomy and PCI to Cx on 11/29/2017. His past medical history also includes sick sinus syndrome status post [employer] pacemaker implantation 2011 for marked bradycardia and sick sinus syndrome, diabetes mellitus, hypertension and hyperlipidemia.  He presents for hospital  follow up after overnight stay on 09/02/2018 for flu-like symptoms and chest pain. He was noted to have slight troponin leak with peak troponin at 0.08 that trended down at discharge. He was noted to be severely dehydrated which was felt to be cause of troponin leak; however, he is unsure of how he got dehydrated as he only vomited one time prior to hospital visit. Blood pressure was also noted to be low. No changes were made to his medications. He was given IV fluids and as his labs improved and chest pain resolved, he was encouraged to follow up with [place] outpatient.   He continues to feel weak since discharge, but has not had any reoccurrence of chest pain. He has not resumed his normal activities. He does report getting shingles vaccine prior to his illness and is unsure if this is related. Wife is present at bedside.    Past Medical History:  Diagnosis Date  . CAD (coronary artery disease), native coronary artery 08/30/2018   2007 [place], [place]: LIMA to LAD, Occluded SVG to RCA and OM by angiogram S/P  PCI/orbital atherectomy S/P 2.5x15 Resoluute to prox Cx on 11/29/2017.  Marland Kitchen Coronary artery disease   . High cholesterol   . Hypertension   . Myocardial infarction [facility]) 2017/2018?  . NSTEMI (non-ST elevated myocardial infarction) ([place]) 02/03/2015  . Presence of permanent cardiac pacemaker   . Type II diabetes mellitus ([place])     Past Surgical History:  Procedure Laterality Date  . CARDIAC CATHETERIZATION N/A 02/04/2015   Procedure: Left Heart Cath and Cors/Grafts Angiography;  Surgeon: Adrian Prows, MD;  Location:  [place] [facility];  Service: Cardiovascular;  Laterality: N/A;  . CARDIAC CATHETERIZATION N/A 02/04/2015   Procedure: Coronary Stent Intervention;  Surgeon: Adrian Prows, MD;  Location: [place] [facility];  Service: Cardiovascular;  Laterality: N/A;  . CORONARY ANGIOGRAPHY N/A 10/18/2017   Procedure: CORONARY ANGIOGRAPHY;  Surgeon: Adrian Prows, MD;  Location: [place] [facility];  Service:  Cardiovascular;  Laterality: N/A;  . CORONARY ANGIOPLASTY    . CORONARY ANGIOPLASTY WITH STENT PLACEMENT     "i've got 5-6 stents total" (08/30/2017)  . CORONARY ARTERY BYPASS GRAFT  2007   CABG X3  . CORONARY ATHERECTOMY N/A 10/18/2017   Procedure: CORONARY ATHERECTOMY;  Surgeon: Adrian Prows, MD;  Location: [place] [facility];  Service: Cardiovascular;  Laterality: N/A;  . CORONARY ATHERECTOMY N/A 11/29/2017   Procedure: CORONARY ATHERECTOMY;  Surgeon: Adrian Prows, MD;  Location: [place] [facility];  Service: Cardiovascular;  Laterality: N/A;  . CORONARY CTO INTERVENTION N/A 08/30/2017   Procedure: CORONARY CTO INTERVENTION;  Surgeon: Adrian Prows, MD;  Location: [place] [facility];  Service: Cardiovascular;  Laterality: N/A;  . CORONARY STENT INTERVENTION N/A 11/29/2017   Procedure: CORONARY STENT INTERVENTION;  Surgeon: Adrian Prows, MD;  Location: [place] [facility];  Service: Cardiovascular;  Laterality: N/A;  . INSERT / REPLACE / REMOVE PACEMAKER  ?2012   "I think it was placed in 2012"  . LEFT HEART CATH AND CORONARY ANGIOGRAPHY N/A 08/30/2017   Procedure: LEFT HEART CATH AND CORONARY ANGIOGRAPHY;  Surgeon: Adrian Prows, MD;  Location: [place] [facility];  Service: Cardiovascular;  Laterality: N/A;  . LEFT HEART CATH AND CORS/GRAFTS ANGIOGRAPHY N/A 08/05/2017   Procedure: LEFT HEART CATH AND CORS/GRAFTS ANGIOGRAPHY;  Surgeon: Adrian Prows, MD;  Location: [place] [facility];  Service: Cardiovascular;  Laterality: N/A;    Social History   Socioeconomic History  . Marital status: Widowed    Spouse name: Not on file  . Number of children: 0  . Years of education: Not on file  . Highest education level: Not on file  Occupational History  . Not on file  Social Needs  . Financial resource strain: Not on file  . Food insecurity:    Worry: Not on file    Inability: Not on file  . Transportation needs:    Medical: Not on file    Non-medical: Not on file  Tobacco Use  . Smoking status: Former Smoker      Years: 3.00    Types: Cigarettes    Last attempt to quit: 1960    Years since quitting: 60.4  . Smokeless tobacco: Never Used  Substance and Sexual Activity  . Alcohol use: No  . Drug use: No  . Sexual activity: Yes  Lifestyle  . Physical activity:    Days per week: Not on file    Minutes per session: Not on file  . Stress: Not on file  Relationships  . Social connections:    Talks on phone: Not on file    Gets together: Not on file    Attends religious service: Not on file    Active member of club or organization: Not on file    Attends meetings of clubs or organizations: Not on file    Relationship status: Not on file  . Intimate partner violence:    Fear of current or ex partner: Not on file    Emotionally abused: Not on file    Physically abused: Not on file  Forced sexual activity: Not on file  Other Topics Concern  . Not on file  Social History Narrative  . Not on file   Review of Systems  Constitution: Negative for chills, decreased appetite, malaise/fatigue and weight gain.  Cardiovascular: Positive for chest pain (stable angina). Negative for dyspnea on exertion, leg swelling and syncope.  Endocrine: Negative for cold intolerance.  Hematologic/Lymphatic: Does not bruise/bleed easily.  Musculoskeletal: Negative for joint swelling.  Gastrointestinal: Negative for abdominal pain, anorexia, change in bowel habit, hematochezia and melena.  Neurological: Negative for headaches and light-headedness.  Psychiatric/Behavioral: Negative for depression and substance abuse.  All other systems reviewed and are negative.     Objective  Blood pressure 138/68, pulse 67, height 5\' 10"  (1.778 m), weight 201 lb 11.2 oz (91.5 kg), SpO2 97 %. Body mass index is 28.94 kg/m. Physical exam not performed or limited due to virtual visit.  Patient appeared to be in no distress, Neck was supple, respiration was not labored.  Please see exam details from prior visit is as below.     Physical Exam  Constitutional: He appears well-developed and well-nourished. No distress.  HENT:  Head: Atraumatic.  Eyes: Conjunctivae are normal.  Neck: Neck supple. No JVD present. No thyromegaly present.  Cardiovascular: Normal rate, regular rhythm, normal heart sounds and intact distal pulses. Exam reveals no gallop.  No murmur heard. Pulmonary/Chest: Effort normal and breath sounds normal.  Abdominal: Soft. Bowel sounds are normal.  Musculoskeletal: Normal range of motion.  Neurological: He is alert.  Skin: Skin is warm and dry.  Psychiatric: He has a normal mood and affect.   Radiology: No results found.  Laboratory examination:    CMP Latest Ref Rng & Units 09/03/2018 09/02/2018 11/30/2017  Glucose 70 - 99 mg/dL 114(H) 241(H) 182(H)  BUN 8 - 23 mg/dL 16 20 14   Creatinine 0.61 - 1.24 mg/dL 1.18 1.33(H) 1.00  Sodium 135 - 145 mmol/L 137 135 136  Potassium 3.5 - 5.1 mmol/L 3.9 4.0 4.0  Chloride 98 - 111 mmol/L 108 105 110  CO2 22 - 32 mmol/L 19(L) 19(L) 21(L)  Calcium 8.9 - 10.3 mg/dL 8.1(L) 8.6(L) 8.1(L)  Total Protein 6.5 - 8.1 g/dL - 5.9(L) -  Total Bilirubin 0.3 - 1.2 mg/dL - 1.1 -  Alkaline Phos 38 - 126 U/L - 52 -  AST 15 - 41 U/L - 19 -  ALT 0 - 44 U/L - 17 -   CBC Latest Ref Rng & Units 09/03/2018 09/02/2018 09/02/2018  WBC 4.0 - 10.5 K/uL 4.2 7.2 10.9(H)  Hemoglobin 13.0 - 17.0 g/dL 12.3(L) 12.4(L) 12.9(L)  Hematocrit 39.0 - 52.0 % 37.4(L) 36.3(L) 38.2(L)  Platelets 150 - 400 K/uL PLATELET CLUMPS NOTED ON SMEAR, COUNT APPEARS DECREASED PLATELET CLUMPS NOTED ON SMEAR, UNABLE TO ESTIMATE 123(L)   Lipid Panel     Component Value Date/Time   CHOL 108 02/05/2015 1119   TRIG 226 (H) 02/05/2015 1119   HDL 21 (L) 02/05/2015 1119   CHOLHDL 5.1 02/05/2015 1119   VLDL 45 (H) 02/05/2015 1119   LDLCALC 42 02/05/2015 1119   HEMOGLOBIN A1C No results found for: HGBA1C, MPG TSH No results for input(s): TSH in the last 8760 hours.  PRN Meds:. Medications Discontinued  During This Encounter  Medication Reason  . clopidogrel (PLAVIX) 75 MG tablet Change in therapy  . Omega-3 Fatty Acids (FISH OIL PO) Discontinued by provider  . aspirin 81 MG chewable tablet Discontinued by provider   Current Meds  Medication Sig  .  Biotin 5000 MCG TABS Take 5,000 mcg by mouth daily.  . dorzolamide-timolol (COSOPT) 22.3-6.8 MG/ML ophthalmic solution Place 1 drop into both eyes 2 (two) times daily.  Marland Kitchen glimepiride (AMARYL) 2 MG tablet Take 2 mg by mouth 2 (two) times daily.  Marland Kitchen latanoprost (XALATAN) 0.005 % ophthalmic solution Place 1 drop into both eyes at bedtime.  . metFORMIN (GLUCOPHAGE-XR) 500 MG 24 hr tablet Take 1 tablet (500 mg total) by mouth daily.  . metoprolol tartrate (LOPRESSOR) 25 MG tablet Take 0.5 tablets (12.5 mg total) by mouth 3 (three) times daily. (Patient taking differently: Take 25 mg by mouth 2 (two) times daily. )  . nitroGLYCERIN (NITROSTAT) 0.4 MG SL tablet ONE TABLET UNDER TONGUE AS NEEDED FOR CHEST PAIN  . simvastatin (ZOCOR) 40 MG tablet Take 40 mg by mouth daily.  . TRUE METRIX BLOOD GLUCOSE TEST test strip TEST 3 TIMES D  . vitamin B-12 (CYANOCOBALAMIN) 1000 MCG tablet Take 1,000 mcg by mouth daily.  . [DISCONTINUED] aspirin 81 MG chewable tablet Chew 1 tablet (81 mg total) by mouth daily.  . [DISCONTINUED] clopidogrel (PLAVIX) 75 MG tablet Take 75 mg by mouth daily.  . [DISCONTINUED] losartan (COZAAR) 25 MG tablet Take 25 mg by mouth daily.  . [DISCONTINUED] Omega-3 Fatty Acids (FISH OIL PO) Take 1 capsule by mouth 2 (two) times a day.     Cardiac Studies:   Coronary Angiogram 11/29/2017:  (CABG 2007 [place], [place]): LIMA to LAD, Occluded SVG to RCA and OM by angiogram S/P  PCI/orbital atherectomy S/P 2.5x15 Resoluute to prox Cx on 11/29/2017.  Echocardiogram  08/24/2017: Left ventricle cavity is normal in size. Moderate concentric hypertrophy of the left ventricle. Visual EF is 45-50%. Abnormal septal wall motion due to post-operative  coronary artery bypass graft. Doppler evidence of grade I (impaired) diastolic dysfunction, normal LAP. Mild (Grade I) aortic regurgitation. Mild (Grade I) mitral regurgitation. Inadequate tricuspid regurgitation jet to estimate pulmonary artery pressure. Normal right atrial pressure. Compared to prior study dated 67/61/9509, LV systolic function is mildly reduced.  Nuclear stress test [02/14/2017]: 1. The resting electrocardiogram demonstrated normal sinus rhythm and incomplete RBBB. Inferior and posterior infarct, old. Stress EKG is non-diagnostic for ischemia as it a pharmacologic stress using Lexiscan. Stress symptoms included dyspnea. Patient unable to complete stress test due to fatigue and switched to [place]. 2. Myocardial perfusion imaging is abnormal. There is a moderate area of infarction in the basal inferior, mid inferior and apical inferior myocardial wall(s). Gated SPECT imaging demonstrates akinesis of the basal inferior, mid inferior and apical inferior myocardial wall(s). The left ventricular ejection fraction was calculated or visually estimated to be 38%. This is an intermediate risk study, clinical correlation recommended.  Assessment   Coronary artery disease involving native coronary artery of native heart without angina pectoris - Plan: EKG 12-Lead  Paroxysmal atrial fibrillation (HCC) CHA2DS2-VASc Score is 4. Yearly risk of stroke 4  - Plan: rivaroxaban (XARELTO) 20 MG TABS tablet  Sick sinus syndrome (HCC)  Coronary artery disease involving autologous vein coronary bypass graft without angina pectoris  09/05/18: Alert: 33 AMS episodes. Longest A. Fl new onset, 2h:34min of A. Fl probably atypical. Burden. 0.3% burden. Normal function. (Patient only recently got the transmitter as previous one was broke and hence the delay) EKG 09/07/2018: A paced rhythm at 69 bpm, left axis deviation, left anterior fasicular block, RBBB. Cannot exclude inferior infarct old, posterior  infarct old. No evidence of ischemia.   Recommendations:   Duplicate note for 10/18/69  Adrian Prows, MD, [facility] 12/15/2018, 4:16 PM [facility] [place] Pager: [phone] Office: [phone] If no answer Cell [phone]

## 2018-09-11 DIAGNOSIS — I517 Cardiomegaly: Secondary | ICD-10-CM | POA: Diagnosis not present

## 2018-09-11 DIAGNOSIS — I252 Old myocardial infarction: Secondary | ICD-10-CM | POA: Diagnosis not present

## 2018-09-11 DIAGNOSIS — E782 Mixed hyperlipidemia: Secondary | ICD-10-CM | POA: Diagnosis not present

## 2018-09-11 DIAGNOSIS — Z09 Encounter for follow-up examination after completed treatment for conditions other than malignant neoplasm: Secondary | ICD-10-CM | POA: Diagnosis not present

## 2018-09-11 DIAGNOSIS — Z955 Presence of coronary angioplasty implant and graft: Secondary | ICD-10-CM | POA: Diagnosis not present

## 2018-09-11 DIAGNOSIS — E118 Type 2 diabetes mellitus with unspecified complications: Secondary | ICD-10-CM | POA: Diagnosis not present

## 2018-11-07 ENCOUNTER — Other Ambulatory Visit: Payer: Self-pay | Admitting: Cardiology

## 2018-11-23 DIAGNOSIS — E118 Type 2 diabetes mellitus with unspecified complications: Secondary | ICD-10-CM | POA: Diagnosis not present

## 2018-11-23 DIAGNOSIS — Z125 Encounter for screening for malignant neoplasm of prostate: Secondary | ICD-10-CM | POA: Diagnosis not present

## 2018-11-28 DIAGNOSIS — Z1211 Encounter for screening for malignant neoplasm of colon: Secondary | ICD-10-CM | POA: Diagnosis not present

## 2018-11-28 DIAGNOSIS — Z1212 Encounter for screening for malignant neoplasm of rectum: Secondary | ICD-10-CM | POA: Diagnosis not present

## 2018-11-28 DIAGNOSIS — Z951 Presence of aortocoronary bypass graft: Secondary | ICD-10-CM | POA: Diagnosis not present

## 2018-11-28 DIAGNOSIS — M419 Scoliosis, unspecified: Secondary | ICD-10-CM | POA: Diagnosis not present

## 2018-11-28 DIAGNOSIS — E118 Type 2 diabetes mellitus with unspecified complications: Secondary | ICD-10-CM | POA: Diagnosis not present

## 2018-11-28 DIAGNOSIS — R05 Cough: Secondary | ICD-10-CM | POA: Diagnosis not present

## 2018-11-28 DIAGNOSIS — I10 Essential (primary) hypertension: Secondary | ICD-10-CM | POA: Diagnosis not present

## 2018-11-28 DIAGNOSIS — Z7982 Long term (current) use of aspirin: Secondary | ICD-10-CM | POA: Diagnosis not present

## 2018-11-28 DIAGNOSIS — L570 Actinic keratosis: Secondary | ICD-10-CM | POA: Diagnosis not present

## 2018-11-28 DIAGNOSIS — E782 Mixed hyperlipidemia: Secondary | ICD-10-CM | POA: Diagnosis not present

## 2018-12-05 DIAGNOSIS — Z950 Presence of cardiac pacemaker: Secondary | ICD-10-CM | POA: Diagnosis not present

## 2018-12-05 DIAGNOSIS — I495 Sick sinus syndrome: Secondary | ICD-10-CM | POA: Diagnosis not present

## 2018-12-05 DIAGNOSIS — Z45018 Encounter for adjustment and management of other part of cardiac pacemaker: Secondary | ICD-10-CM | POA: Diagnosis not present

## 2018-12-05 NOTE — Progress Notes (Signed)
Primary Physician/Referring:  Deland Pretty, MD  Patient ID: Jesse [name], male    DOB: May 25, 1941, 78 y.o.   MRN: [id number]  Chief Complaint  Patient presents with  . Coronary Artery Disease   This visit type was conducted due to national recommendations for restrictions regarding the COVID-19 Pandemic (e.g. social distancing).  This format is felt to be most appropriate for this patient at this time.  All issues noted in this document were discussed and addressed.  No physical exam was performed (except for noted visual exam findings with Telehealth visits).  The patient has consented to conduct a Telehealth visit and understands insurance will be billed.   I discussed the limitations of evaluation and management by telemedicine and the availability of in person appointments. The patient expressed understanding and agreed to proceed.  Virtual Visit via Video Note is as below  I connected with Jesse [name], on 12/06/18 at 1000 by telephone and verified that I am speaking with the correct person using two identifiers. Unable to perform video visit as patient did not have equipment.    I have discussed with the patient regarding the safety during COVID Pandemic and steps and precautions including social distancing with the patient.    HPI: Jesse [name]  is a 78 y.o. male  with coronary artery disease and CABG in 2007 in [place] with LIMA to LAD, SVG to OM which is occluded by angiography on 02/04/2015, occluded SVG to RCA. He underwent complex successful arthrectomy and PCI to Cx on 11/29/2017. His past medical history also includes sick sinus syndrome status post [employer] pacemaker implantation 2011 for marked bradycardia and sick sinus syndrome, diabetes mellitus, hypertension and hyperlipidemia.  He was evaluated at the hospital on 09/02/2018 for flu-like symptoms and chest pain. He was noted to have slight troponin leak with peak troponin at 0.08 that trended down at discharge. He was  noted to be severely dehydrated which was felt to be cause of troponin leak; however, he is unsure of how he got dehydrated as he only vomited one time prior to hospital visit. He had gotten shingles vacciene several days prior to this and was unsure if related. He continued to have weakness at his last office visit felt to be related to post-viral syndrome.   Since last seen by [place], weakness and fatigue have resolved. He is doing well without any complaints. He is tolerating medications well. He is exercising on the elliptical approximately 15 minutes per day and also walking around his house that he is tolerating well. States he has recently seen Dr. Shelia Media and had labs performed and states he got a good report.    Past Medical History:  Diagnosis Date  . CAD (coronary artery disease), native coronary artery 08/30/2018   2007 [place], [place]: LIMA to LAD, Occluded SVG to RCA and OM by angiogram S/P  PCI/orbital atherectomy S/P 2.5x15 Resoluute to prox Cx on 11/29/2017.  Marland Kitchen Coronary artery disease   . High cholesterol   . Hypertension   . Myocardial infarction [facility]) 2017/2018?  . NSTEMI (non-ST elevated myocardial infarction) ([place]) 02/03/2015  . Presence of permanent cardiac pacemaker   . Type II diabetes mellitus ([place])     Past Surgical History:  Procedure Laterality Date  . CARDIAC CATHETERIZATION N/A 02/04/2015   Procedure: Left Heart Cath and Cors/Grafts Angiography;  Surgeon: Adrian Prows, MD;  Location: [place] [facility];  Service: Cardiovascular;  Laterality: N/A;  . CARDIAC CATHETERIZATION N/A 02/04/2015   Procedure: Coronary  Stent Intervention;  Surgeon: Adrian Prows, MD;  Location: [place] [facility];  Service: Cardiovascular;  Laterality: N/A;  . CORONARY ANGIOGRAPHY N/A 10/18/2017   Procedure: CORONARY ANGIOGRAPHY;  Surgeon: Adrian Prows, MD;  Location: [place] [facility];  Service: Cardiovascular;  Laterality: N/A;  . CORONARY ANGIOPLASTY    . CORONARY ANGIOPLASTY WITH STENT PLACEMENT      "i've got 5-6 stents total" (08/30/2017)  . CORONARY ARTERY BYPASS GRAFT  2007   CABG X3  . CORONARY ATHERECTOMY N/A 10/18/2017   Procedure: CORONARY ATHERECTOMY;  Surgeon: Adrian Prows, MD;  Location: [place] [facility];  Service: Cardiovascular;  Laterality: N/A;  . CORONARY ATHERECTOMY N/A 11/29/2017   Procedure: CORONARY ATHERECTOMY;  Surgeon: Adrian Prows, MD;  Location: [place] [facility];  Service: Cardiovascular;  Laterality: N/A;  . CORONARY CTO INTERVENTION N/A 08/30/2017   Procedure: CORONARY CTO INTERVENTION;  Surgeon: Adrian Prows, MD;  Location: [place] [facility];  Service: Cardiovascular;  Laterality: N/A;  . CORONARY STENT INTERVENTION N/A 11/29/2017   Procedure: CORONARY STENT INTERVENTION;  Surgeon: Adrian Prows, MD;  Location: [place] [facility];  Service: Cardiovascular;  Laterality: N/A;  . INSERT / REPLACE / REMOVE PACEMAKER  ?2012   "I think it was placed in 2012"  . LEFT HEART CATH AND CORONARY ANGIOGRAPHY N/A 08/30/2017   Procedure: LEFT HEART CATH AND CORONARY ANGIOGRAPHY;  Surgeon: Adrian Prows, MD;  Location: [place] [facility];  Service: Cardiovascular;  Laterality: N/A;  . LEFT HEART CATH AND CORS/GRAFTS ANGIOGRAPHY N/A 08/05/2017   Procedure: LEFT HEART CATH AND CORS/GRAFTS ANGIOGRAPHY;  Surgeon: Adrian Prows, MD;  Location: [place] [facility];  Service: Cardiovascular;  Laterality: N/A;    Social History   Socioeconomic History  . Marital status: Widowed    Spouse name: Not on file  . Number of children: 0  . Years of education: Not on file  . Highest education level: Not on file  Occupational History  . Not on file  Social Needs  . Financial resource strain: Not on file  . Food insecurity:    Worry: Not on file    Inability: Not on file  . Transportation needs:    Medical: Not on file    Non-medical: Not on file  Tobacco Use  . Smoking status: Former Smoker    Years: 3.00    Types: Cigarettes    Last attempt to quit: 1960    Years since quitting: 60.4  . Smokeless  tobacco: Never Used  Substance and Sexual Activity  . Alcohol use: No  . Drug use: No  . Sexual activity: Yes  Lifestyle  . Physical activity:    Days per week: Not on file    Minutes per session: Not on file  . Stress: Not on file  Relationships  . Social connections:    Talks on phone: Not on file    Gets together: Not on file    Attends religious service: Not on file    Active member of club or organization: Not on file    Attends meetings of clubs or organizations: Not on file    Relationship status: Not on file  . Intimate partner violence:    Fear of current or ex partner: Not on file    Emotionally abused: Not on file    Physically abused: Not on file    Forced sexual activity: Not on file  Other Topics Concern  . Not on file  Social History Narrative  .  Not on file    Current Outpatient Medications on File Prior to Visit  Medication Sig Dispense Refill  . aspirin 81 MG chewable tablet Chew 1 tablet (81 mg total) by mouth daily. 30 tablet 0  . Biotin 5000 MCG TABS Take 5,000 mcg by mouth daily.    . clopidogrel (PLAVIX) 75 MG tablet Take 75 mg by mouth daily.    . dorzolamide-timolol (COSOPT) 22.3-6.8 MG/ML ophthalmic solution Place 1 drop into both eyes 2 (two) times daily.  10  . glimepiride (AMARYL) 2 MG tablet Take 2 mg by mouth 2 (two) times daily.    Marland Kitchen latanoprost (XALATAN) 0.005 % ophthalmic solution Place 1 drop into both eyes at bedtime.  12  . losartan (COZAAR) 25 MG tablet TAKE 1 TABLET BY MOUTH DAILY 90 tablet 1  . metFORMIN (GLUCOPHAGE-XR) 500 MG 24 hr tablet Take 1 tablet (500 mg total) by mouth daily.  1  . metoprolol tartrate (LOPRESSOR) 25 MG tablet Take 0.5 tablets (12.5 mg total) by mouth 3 (three) times daily. (Patient taking differently: Take 25 mg by mouth 2 (two) times daily. ) 90 tablet 0  . nitroGLYCERIN (NITROSTAT) 0.4 MG SL tablet ONE TABLET UNDER TONGUE AS NEEDED FOR CHEST PAIN 25 tablet 2  . Omega-3 Fatty Acids (FISH OIL PO) Take 1 capsule  by mouth daily.    . simvastatin (ZOCOR) 40 MG tablet Take 40 mg by mouth daily.    . TRUE METRIX BLOOD GLUCOSE TEST test strip TEST 3 TIMES D    . vitamin B-12 (CYANOCOBALAMIN) 1000 MCG tablet Take 1,000 mcg by mouth daily.     No current facility-administered medications on file prior to visit.     Review of Systems  Constitution: Negative for decreased appetite, malaise/fatigue, weight gain and weight loss.  Eyes: Negative for visual disturbance.  Cardiovascular: Negative for chest pain, claudication, dyspnea on exertion, leg swelling, orthopnea, palpitations and syncope.  Respiratory: Positive for cough. Negative for hemoptysis and wheezing.   Endocrine: Negative for cold intolerance and heat intolerance.  Hematologic/Lymphatic: Does not bruise/bleed easily.  Skin: Negative for nail changes.  Musculoskeletal: Negative for muscle weakness and myalgias.  Gastrointestinal: Negative for abdominal pain, change in bowel habit, nausea and vomiting.  Neurological: Negative for difficulty with concentration, dizziness, focal weakness and headaches.  Psychiatric/Behavioral: Negative for altered mental status and suicidal ideas.  All other systems reviewed and are negative.     Objective  Blood pressure 126/75, pulse 79, height 6\' 2"  (1.88 m), weight 200 lb (90.7 kg). Body mass index is 25.68 kg/m.  Physical exam not performed or limited due to virtual visit.    Please see exam details from prior visit is as below.     Physical Exam  Constitutional: He is oriented to person, place, and time. Vital signs are normal. He appears well-developed and well-nourished.  HENT:  Head: Normocephalic and atraumatic.  Neck: Normal range of motion.  Cardiovascular: Normal rate, regular rhythm, normal heart sounds and intact distal pulses.  Pacemaker scar  Pulmonary/Chest: Effort normal and breath sounds normal. No accessory muscle usage. No respiratory distress.  Abdominal: Soft. Bowel sounds are  normal.  Musculoskeletal: Normal range of motion.  Neurological: He is alert and oriented to person, place, and time.  Skin: Skin is warm and dry.  Vitals reviewed.  Radiology: No results found.  Laboratory examination:    CMP Latest Ref Rng & Units 09/03/2018 09/02/2018 11/30/2017  Glucose 70 - 99 mg/dL 114(H) 241(H) 182(H)  BUN  8 - 23 mg/dL 16 20 14   Creatinine 0.61 - 1.24 mg/dL 1.18 1.33(H) 1.00  Sodium 135 - 145 mmol/L 137 135 136  Potassium 3.5 - 5.1 mmol/L 3.9 4.0 4.0  Chloride 98 - 111 mmol/L 108 105 110  CO2 22 - 32 mmol/L 19(L) 19(L) 21(L)  Calcium 8.9 - 10.3 mg/dL 8.1(L) 8.6(L) 8.1(L)  Total Protein 6.5 - 8.1 g/dL - 5.9(L) -  Total Bilirubin 0.3 - 1.2 mg/dL - 1.1 -  Alkaline Phos 38 - 126 U/L - 52 -  AST 15 - 41 U/L - 19 -  ALT 0 - 44 U/L - 17 -   CBC Latest Ref Rng & Units 09/03/2018 09/02/2018 09/02/2018  WBC 4.0 - 10.5 K/uL 4.2 7.2 10.9(H)  Hemoglobin 13.0 - 17.0 g/dL 12.3(L) 12.4(L) 12.9(L)  Hematocrit 39.0 - 52.0 % 37.4(L) 36.3(L) 38.2(L)  Platelets 150 - 400 K/uL PLATELET CLUMPS NOTED ON SMEAR, COUNT APPEARS DECREASED PLATELET CLUMPS NOTED ON SMEAR, UNABLE TO ESTIMATE 123(L)   Lipid Panel     Component Value Date/Time   CHOL 108 02/05/2015 1119   TRIG 226 (H) 02/05/2015 1119   HDL 21 (L) 02/05/2015 1119   CHOLHDL 5.1 02/05/2015 1119   VLDL 45 (H) 02/05/2015 1119   LDLCALC 42 02/05/2015 1119   HEMOGLOBIN A1C No results found for: HGBA1C, MPG TSH No results for input(s): TSH in the last 8760 hours.  Cardiac Studies:   Coronary Angiogram05/01/2018: ([place]): LIMA to LAD, Occluded SVG to RCA and OM by angiogram S/P PCI/orbital atherectomy S/P 2.5x15 Resoluute to prox Cx on 11/29/2017.  Echocardiogram  08/24/2017: Left ventricle cavity is normal in size. Moderate concentric hypertrophy of the left ventricle. Visual EF is 45-50%. Abnormal septal wall motion due to post-operative coronary artery bypass graft. Doppler evidence of grade I  (impaired) diastolic dysfunction, normal LAP. Mild (Grade I) aortic regurgitation. Mild (Grade I) mitral regurgitation. Inadequate tricuspid regurgitation jet to estimate pulmonary artery pressure. Normal right atrial pressure. Compared to prior study dated 16/04/9603, LV systolic function is mildly reduced.  Nuclear stress test [02/14/2017]: 1. The resting electrocardiogram demonstrated normal sinus rhythm and incomplete RBBB. Inferior and posterior infarct, old. Stress EKG is non-diagnostic for ischemia as it a pharmacologic stress using Lexiscan. Stress symptoms included dyspnea. Patient unable to complete stress test due to fatigue and switched to [place]. 2. Myocardial perfusion imaging is abnormal. There is a moderate area of infarction in the basal inferior, mid inferior and apical inferior myocardial wall(s). Gated SPECT imaging demonstrates akinesis of the basal inferior, mid inferior and apical inferior myocardial wall(s). The left ventricular ejection fraction was calculated or visually estimated to be 38%. This is an intermediate risk study, clinical correlation recommended.   Assessment   Coronary artery disease involving native coronary artery of native heart without angina pectoris  S/P CABG x 3  Hx of sick sinus syndrome  Essential hypertension  EKG 09/07/2018: A paced rhythm at 69 bpm, left axis deviation, left anterior fasicular block, RBBB. Cannot exclude inferior infarct old, posterior infarct old. No evidence of ischemia.    Recommendations:   Since last seen by me 3 months ago, patient is doing well.  Suspect his fatigue and weakness was related to post viral syndrome and he has had complete resolution in symptoms.  He has resumed daily exercise that he is tolerating well.  No symptoms of angina.  Denies any shortness of breath or leg edema.  States he does have cough, but has had chest x-ray  with PCP office that was without abnormality.  He is on appropriate  medical therapy and tolerating medications well.  He has recently sent remote transmission from pacemaker, will have this reviewed and notify him of any abnormalities.  He will be due for an office pacemaker check in February.  Blood pressure remained stable.  I will request recent labs from PCP office for our evaluation.  We will seen in the office in 3 months for follow-up.  Miquel Dunn, MSN, APRN, FNP-C [facility]. [place] Office: [phone] Fax: [phone]

## 2018-12-06 ENCOUNTER — Ambulatory Visit (INDEPENDENT_AMBULATORY_CARE_PROVIDER_SITE_OTHER): Payer: Medicare Other | Admitting: Cardiology

## 2018-12-06 ENCOUNTER — Other Ambulatory Visit: Payer: Self-pay

## 2018-12-06 ENCOUNTER — Encounter: Payer: Self-pay | Admitting: Cardiology

## 2018-12-06 VITALS — BP 126/75 | HR 79 | Ht 74.0 in | Wt 200.0 lb

## 2018-12-06 DIAGNOSIS — I10 Essential (primary) hypertension: Secondary | ICD-10-CM

## 2018-12-06 DIAGNOSIS — I2510 Atherosclerotic heart disease of native coronary artery without angina pectoris: Secondary | ICD-10-CM

## 2018-12-06 DIAGNOSIS — Z951 Presence of aortocoronary bypass graft: Secondary | ICD-10-CM

## 2018-12-06 DIAGNOSIS — Z8679 Personal history of other diseases of the circulatory system: Secondary | ICD-10-CM | POA: Diagnosis not present

## 2018-12-06 DIAGNOSIS — L089 Local infection of the skin and subcutaneous tissue, unspecified: Secondary | ICD-10-CM | POA: Diagnosis not present

## 2018-12-12 DIAGNOSIS — H04123 Dry eye syndrome of bilateral lacrimal glands: Secondary | ICD-10-CM | POA: Diagnosis not present

## 2018-12-12 DIAGNOSIS — H401132 Primary open-angle glaucoma, bilateral, moderate stage: Secondary | ICD-10-CM | POA: Diagnosis not present

## 2018-12-12 DIAGNOSIS — H2513 Age-related nuclear cataract, bilateral: Secondary | ICD-10-CM | POA: Diagnosis not present

## 2018-12-15 ENCOUNTER — Ambulatory Visit (INDEPENDENT_AMBULATORY_CARE_PROVIDER_SITE_OTHER): Payer: Medicare Other | Admitting: Cardiology

## 2018-12-15 ENCOUNTER — Encounter: Payer: Self-pay | Admitting: Cardiology

## 2018-12-15 ENCOUNTER — Other Ambulatory Visit: Payer: Self-pay

## 2018-12-15 VITALS — BP 136/70 | HR 86 | Ht 74.0 in | Wt 200.0 lb

## 2018-12-15 DIAGNOSIS — I480 Paroxysmal atrial fibrillation: Secondary | ICD-10-CM | POA: Diagnosis not present

## 2018-12-15 DIAGNOSIS — I495 Sick sinus syndrome: Secondary | ICD-10-CM | POA: Diagnosis not present

## 2018-12-15 DIAGNOSIS — I209 Angina pectoris, unspecified: Secondary | ICD-10-CM

## 2018-12-15 DIAGNOSIS — I2510 Atherosclerotic heart disease of native coronary artery without angina pectoris: Secondary | ICD-10-CM | POA: Diagnosis not present

## 2018-12-15 DIAGNOSIS — I25810 Atherosclerosis of coronary artery bypass graft(s) without angina pectoris: Secondary | ICD-10-CM | POA: Insufficient documentation

## 2018-12-15 MED ORDER — RIVAROXABAN 20 MG PO TABS: 20.0000 mg | ORAL_TABLET | Freq: Every day | ORAL | 6 refills | Status: DC

## 2018-12-15 NOTE — Addendum Note (Signed)
Addended by: Kela Millin on: 12/15/2018 04:36 PM   Modules accepted: Orders

## 2018-12-15 NOTE — Progress Notes (Signed)
Primary Physician/Referring:  Deland Pretty, MD  Patient ID: Jesse [name], male    DOB: [birth year], 78 y.o.   MRN: [id number]  Chief Complaint  Patient presents with  . Chest Pain  . Hypertension  . Pacemaker Problem  . Follow-up   Virtual Visit via Telephone Note: Patient unable to use video assisted device.  This visit type was conducted due to national recommendations for restrictions regarding the COVID-19 Pandemic (e.g. social distancing).  This format is felt to be most appropriate for this patient at this time.  All issues noted in this document were discussed and addressed.  No physical exam was performed.  The patient has consented to conduct a Telehealth visit and understands insurance will be billed.   I connected with@, on 12/15/18 at  by TELEPHONE and verified that I am speaking with the correct person using two identifiers.   I discussed the limitations of evaluation and management by telemedicine and the availability of in person appointments. The patient expressed understanding and agreed to proceed.   I have discussed with patient regarding the safety during COVID Pandemic and steps and precautions to be taken including social distancing, frequent hand wash and use of detergent soap, gels with the patient. I asked the patient to avoid touching mouth, nose, eyes, ears with the hands. I encouraged regular walking around the neighborhood and exercise and regular diet, as long as social distancing can be maintained.   HPI: Jesse [name]  is a 78 y.o. male  with coronary artery disease and CABG in 2007 in [place] with LIMA to LAD, SVG to OM which is occluded by angiography on 02/04/2015, occluded SVG to RCA. He underwent complex successful arthrectomy and PCI to Cx on 11/29/2017. His past medical history also includes sick sinus syndrome status post [employer] pacemaker implantation 2011 for marked bradycardia and sick sinus syndrome, diabetes mellitus, hypertension and  hyperlipidemia.  I made this appointment as recent pacemaker transmission revealed atypical atrial flutter sustained.  The transmission came 2 days ago.  Patient states that he is asymptomatic with regard to palpitations and feels well and no change in overall symptoms.  He has had one episode of chest pain a week ago for which he took a nitroglycerin and was easily relieved.  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  Atrial fibrillation episode occurred on 09/05/2018.  Past Medical History:  Diagnosis Date  . CAD (coronary artery disease), native coronary artery 08/30/2018   2007 [place], [place]: LIMA to LAD, Occluded SVG to RCA and OM by angiogram S/P  PCI/orbital atherectomy S/P 2.5x15 Resoluute to prox Cx on 11/29/2017.  Marland Kitchen Coronary artery disease   . High cholesterol   . Hypertension   . Myocardial infarction [facility]) 2017/2018?  . NSTEMI (non-ST elevated myocardial infarction) ([place]) 02/03/2015  . Presence of permanent cardiac pacemaker   . Type II diabetes mellitus ([place])     Past Surgical History:  Procedure Laterality Date  . CARDIAC CATHETERIZATION N/A 02/04/2015   Procedure: Left Heart Cath and Cors/Grafts Angiography;  Surgeon: Adrian Prows, MD;  Location: [place] [facility];  Service: Cardiovascular;  Laterality: N/A;  . CARDIAC CATHETERIZATION N/A 02/04/2015   Procedure: Coronary Stent Intervention;  Surgeon: Adrian Prows, MD;  Location: [place] [facility];  Service: Cardiovascular;  Laterality: N/A;  . CORONARY ANGIOGRAPHY N/A 10/18/2017   Procedure: CORONARY  ANGIOGRAPHY;  Surgeon: Adrian Prows, MD;  Location: [place] [facility];  Service: Cardiovascular;  Laterality: N/A;  . CORONARY ANGIOPLASTY    . CORONARY ANGIOPLASTY WITH STENT PLACEMENT     "i've got 5-6 stents total" (08/30/2017)  . CORONARY ARTERY BYPASS GRAFT  2007   CABG X3  .  CORONARY ATHERECTOMY N/A 10/18/2017   Procedure: CORONARY ATHERECTOMY;  Surgeon: Adrian Prows, MD;  Location: [place] [facility];  Service: Cardiovascular;  Laterality: N/A;  . CORONARY ATHERECTOMY N/A 11/29/2017   Procedure: CORONARY ATHERECTOMY;  Surgeon: Adrian Prows, MD;  Location: [place] [facility];  Service: Cardiovascular;  Laterality: N/A;  . CORONARY CTO INTERVENTION N/A 08/30/2017   Procedure: CORONARY CTO INTERVENTION;  Surgeon: Adrian Prows, MD;  Location: [place] [facility];  Service: Cardiovascular;  Laterality: N/A;  . CORONARY STENT INTERVENTION N/A 11/29/2017   Procedure: CORONARY STENT INTERVENTION;  Surgeon: Adrian Prows, MD;  Location: [place] [facility];  Service: Cardiovascular;  Laterality: N/A;  . INSERT / REPLACE / REMOVE PACEMAKER  ?2012   "I think it was placed in 2012"  . LEFT HEART CATH AND CORONARY ANGIOGRAPHY N/A 08/30/2017   Procedure: LEFT HEART CATH AND CORONARY ANGIOGRAPHY;  Surgeon: Adrian Prows, MD;  Location: [place] [facility];  Service: Cardiovascular;  Laterality: N/A;  . LEFT HEART CATH AND CORS/GRAFTS ANGIOGRAPHY N/A 08/05/2017   Procedure: LEFT HEART CATH AND CORS/GRAFTS ANGIOGRAPHY;  Surgeon: Adrian Prows, MD;  Location: [place] [facility];  Service: Cardiovascular;  Laterality: N/A;    Social History   Socioeconomic History  . Marital status: Widowed    Spouse name: Not on file  . Number of children: 0  . Years of education: Not on file  . Highest education level: Not on file  Occupational History  . Not on file  Social Needs  . Financial resource strain: Not on file  . Food insecurity:    Worry: Not on file    Inability: Not on file  . Transportation needs:    Medical: Not on file    Non-medical: Not on file  Tobacco Use  . Smoking status: Former Smoker    Years: 3.00    Types: Cigarettes    Last attempt to quit: 1960    Years since quitting: 60.4  . Smokeless tobacco: Never Used  Substance and Sexual Activity  . Alcohol use: No  . Drug use: No  .  Sexual activity: Yes  Lifestyle  . Physical activity:    Days per week: Not on file    Minutes per session: Not on file  . Stress: Not on file  Relationships  . Social connections:    Talks on phone: Not on file    Gets together: Not on file    Attends religious service: Not on file    Active member of club or organization: Not on file    Attends meetings of clubs or organizations: Not on file    Relationship status: Not on file  . Intimate partner violence:    Fear of current or ex partner: Not on file    Emotionally abused: Not on file    Physically abused: Not on file    Forced sexual activity: Not on file  Other Topics Concern  . Not on file  Social History Narrative  . Not on file    Current Outpatient Medications on File Prior to Visit  Medication Sig Dispense Refill  . Biotin 5000 MCG TABS Take 5,000 mcg  by mouth daily.    . dorzolamide-timolol (COSOPT) 22.3-6.8 MG/ML ophthalmic solution Place 1 drop into both eyes 2 (two) times daily.  10  . glimepiride (AMARYL) 2 MG tablet Take 2 mg by mouth 2 (two) times daily.    Marland Kitchen latanoprost (XALATAN) 0.005 % ophthalmic solution Place 1 drop into both eyes at bedtime.  12  . losartan (COZAAR) 25 MG tablet TAKE 1 TABLET BY MOUTH DAILY 90 tablet 1  . metFORMIN (GLUCOPHAGE-XR) 500 MG 24 hr tablet Take 1 tablet (500 mg total) by mouth daily.  1  . metoprolol tartrate (LOPRESSOR) 25 MG tablet Take 0.5 tablets (12.5 mg total) by mouth 3 (three) times daily. (Patient taking differently: Take 25 mg by mouth 2 (two) times daily. ) 90 tablet 0  . nitroGLYCERIN (NITROSTAT) 0.4 MG SL tablet ONE TABLET UNDER TONGUE AS NEEDED FOR CHEST PAIN 25 tablet 2  . simvastatin (ZOCOR) 40 MG tablet Take 40 mg by mouth daily.    . TRUE METRIX BLOOD GLUCOSE TEST test strip TEST 3 TIMES D    . vitamin B-12 (CYANOCOBALAMIN) 1000 MCG tablet Take 1,000 mcg by mouth daily.    . rivaroxaban (XARELTO) 20 MG TABS tablet Take 1 tablet (20 mg total) by mouth daily with  supper. 30 tablet 6   No current facility-administered medications on file prior to visit.    Review of Systems  Constitutional: Negative for malaise/fatigue and weight loss.  Respiratory: Negative for cough, hemoptysis and shortness of breath.   Cardiovascular: Positive for chest pain (angina, stable). Negative for palpitations, claudication and leg swelling.  Gastrointestinal: Negative for abdominal pain, blood in stool, constipation, heartburn and vomiting.  Genitourinary: Negative for dysuria.  Musculoskeletal: Negative for joint pain and myalgias.       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 Negative for dizziness and headaches.  Endo/Heme/Allergies: Does not bruise/bleed easily.  Psychiatric/Behavioral: Negative for depression. The patient is not nervous/anxious.   All other systems reviewed and are negative.     Objective  Blood pressure 136/70, pulse 86, height 6\' 2"  (1.88 m), weight 200 lb (90.7 kg). Body mass index is 25.68 kg/m.   Physical Exam  Constitutional: He appears well-developed and well-nourished. No distress.  HENT:  Head: Atraumatic.  Eyes: Conjunctivae are normal.  Neck: Neck supple. No JVD present. No thyromegaly present.  Cardiovascular: Normal rate, regular rhythm, normal heart sounds and intact distal pulses. Exam reveals no gallop.  No murmur heard. Pulmonary/Chest: Effort normal and breath sounds normal.  Abdominal: Soft. Bowel sounds are normal.  Musculoskeletal: Normal range of motion.  Neurological: He is alert.  Skin: Skin is warm and dry.  Psychiatric: He has a normal mood and affect.   Radiology: No results found.  Laboratory examination:    CMP Latest Ref Rng & Units 09/03/2018 09/02/2018 11/30/2017  Glucose 70 - 99 mg/dL 114(H) 241(H) 182(H)  BUN 8 - 23 mg/dL 16 20 14   Creatinine 0.61 - 1.24 mg/dL 1.18 1.33(H) 1.00  Sodium 135 - 145 mmol/L 137 135  136  Potassium 3.5 - 5.1 mmol/L 3.9 4.0 4.0  Chloride 98 - 111 mmol/L 108 105 110  CO2 22 - 32 mmol/L 19(L) 19(L) 21(L)  Calcium 8.9 - 10.3 mg/dL 8.1(L) 8.6(L) 8.1(L)  Total Protein 6.5 - 8.1 g/dL - 5.9(L) -  Total Bilirubin 0.3 - 1.2 mg/dL - 1.1 -  Alkaline Phos 38 - 126 U/L -  52 -  AST 15 - 41 U/L - 19 -  ALT 0 - 44 U/L - 17 -   CBC Latest Ref Rng & Units 09/03/2018 09/02/2018 09/02/2018  WBC 4.0 - 10.5 K/uL 4.2 7.2 10.9(H)  Hemoglobin 13.0 - 17.0 g/dL 12.3(L) 12.4(L) 12.9(L)  Hematocrit 39.0 - 52.0 % 37.4(L) 36.3(L) 38.2(L)  Platelets 150 - 400 K/uL PLATELET CLUMPS NOTED ON SMEAR, COUNT APPEARS DECREASED PLATELET CLUMPS NOTED ON SMEAR, UNABLE TO ESTIMATE 123(L)   Lipid Panel     Component Value Date/Time   CHOL 108 02/05/2015 1119   TRIG 226 (H) 02/05/2015 1119   HDL 21 (L) 02/05/2015 1119   CHOLHDL 5.1 02/05/2015 1119   VLDL 45 (H) 02/05/2015 1119   LDLCALC 42 02/05/2015 1119   HEMOGLOBIN A1C No results found for: HGBA1C, MPG TSH No results for input(s): TSH in the last 8760 hours.  Cardiac Studies:   Coronary Angiogram05/01/2018: ([place]): LIMA to LAD, Occluded SVG to RCA and OM by angiogram S/P PCI/orbital atherectomy S/P 2.5x15 Resoluute to prox Cx on 11/29/2017.  Echocardiogram  08/24/2017: Left ventricle cavity is normal in size. Moderate concentric hypertrophy of the left ventricle. Visual EF is 45-50%. Abnormal septal wall motion due to post-operative coronary artery bypass graft. Doppler evidence of grade I (impaired) diastolic dysfunction, normal LAP. Mild (Grade I) aortic regurgitation. Mild (Grade I) mitral regurgitation. Inadequate tricuspid regurgitation jet to estimate pulmonary artery pressure. Normal right atrial pressure. Compared to prior study dated 20/25/4270, LV systolic function is mildly reduced.  Nuclear stress test [02/14/2017]: 1. The resting electrocardiogram demonstrated normal sinus rhythm and incomplete RBBB. Inferior and  posterior infarct, old. Stress EKG is non-diagnostic for ischemia as it a pharmacologic stress using Lexiscan. Stress symptoms included dyspnea. Patient unable to complete stress test due to fatigue and switched to [place]. 2. Myocardial perfusion imaging is abnormal. There is a moderate area of infarction in the basal inferior, mid inferior and apical inferior myocardial wall(s). Gated SPECT imaging demonstrates akinesis of the basal inferior, mid inferior and apical inferior myocardial wall(s). The left ventricular ejection fraction was calculated or visually estimated to be 38%. This is an intermediate risk study, clinical correlation recommended.   Assessment   PAF (paroxysmal atrial fibrillation) (HCC) CHA2DS2-VASc Score is 4. Yearly risk of stroke 4   Sick sinus syndrome (HCC)  Coronary artery disease involving native coronary artery of native heart without angina pectoris  CAD of autologous vein bypass graft without angina  EKG 09/07/2018: A paced rhythm at 69 bpm, left axis deviation, left anterior fasicular block, RBBB. Cannot exclude inferior infarct old, posterior infarct old. No evidence of ischemia.    09/05/18: Alert: 33 AMS episodes. Longest A. Fl new onset, 2h:2min of A. Fl probably atypical. Burden. 0.3% burden. Normal function. (Patient only recently got the transmitter as previous one was broke and hence the delay) EKG 09/07/2018: A paced rhythm at 69 bpm, left axis deviation, left anterior fasicular block, RBBB. Cannot exclude inferior infarct old, posterior infarct old. No evidence of ischemia  Recommendations:   Patient with significant cardiovascular risk factors and known coronary disease and now presenting with PAF, certainly needs to be on anticoagulation.  Patient was extremely upset that he needs to be on anticoagulation but eventually understood the reasons for anticoagulation.  I was also concerned that the transfusion came so late, but upon interrogation,  patient states that his monitor was not working until recently.  He had a new monitor replaced recently due to COVID-19  there was a delay.  He has chronic stable angina, his coronary artery disease has been stable, he has not had any episodes of acute decompensated heart failure.  I have discontinued his aspirin and Plavix and also advised him not to take fish oil supplements and will start him on Xarelto 20 mg daily after supper.  We will continue to monitor his pacemaker remotely.  I would like to see him back in 4 to 6 weeks for follow-up.  This was a 40-minute encounter with the patient, discussion regarding atrial fibrillation, need for anticoagulation, sick sinus syndrome and medication management.  Adrian Prows, MD, [facility] 12/15/2018, 5:19 PM [facility]. [place] Pager: [phone] Office: [phone] If no answer Cell [phone]

## 2018-12-19 ENCOUNTER — Telehealth: Payer: Self-pay

## 2018-12-19 NOTE — Telephone Encounter (Signed)
Xarelto or Eliquis are both expensive. Not sure he qualifies for assistance program. We can try.  We can put him in Coumadin. Needs monitoring.  JG

## 2018-12-19 NOTE — Telephone Encounter (Signed)
Pt called stating that you prescribed Xarelto and it is going to cost him $300 a month. He cannot afford that. Do you want me to proceed with pt assistance. Ive given him the number to text for 30 day voucher that rep emailed [place]. //[phone]

## 2018-12-29 ENCOUNTER — Other Ambulatory Visit: Payer: Self-pay

## 2018-12-29 DIAGNOSIS — I480 Paroxysmal atrial fibrillation: Secondary | ICD-10-CM

## 2018-12-29 MED ORDER — RIVAROXABAN 20 MG PO TABS: 20.0000 mg | ORAL_TABLET | Freq: Every day | ORAL | 11 refills | Status: DC

## 2019-02-05 ENCOUNTER — Other Ambulatory Visit: Payer: Self-pay | Admitting: Cardiology

## 2019-02-05 NOTE — Telephone Encounter (Signed)
Please fill if necessary

## 2019-02-05 NOTE — Telephone Encounter (Signed)
Please fill

## 2019-02-28 ENCOUNTER — Other Ambulatory Visit: Payer: Self-pay

## 2019-02-28 DIAGNOSIS — Z20822 Contact with and (suspected) exposure to covid-19: Secondary | ICD-10-CM

## 2019-03-01 LAB — NOVEL CORONAVIRUS, NAA: SARS-CoV-2, NAA: NOT DETECTED

## 2019-03-13 DIAGNOSIS — Z45018 Encounter for adjustment and management of other part of cardiac pacemaker: Secondary | ICD-10-CM | POA: Diagnosis not present

## 2019-03-13 DIAGNOSIS — Z950 Presence of cardiac pacemaker: Secondary | ICD-10-CM | POA: Diagnosis not present

## 2019-03-13 DIAGNOSIS — I495 Sick sinus syndrome: Secondary | ICD-10-CM | POA: Diagnosis not present

## 2019-03-16 ENCOUNTER — Telehealth: Payer: Self-pay

## 2019-03-16 ENCOUNTER — Encounter: Payer: Self-pay | Admitting: Cardiology

## 2019-03-16 DIAGNOSIS — Z45018 Encounter for adjustment and management of other part of cardiac pacemaker: Secondary | ICD-10-CM | POA: Insufficient documentation

## 2019-03-16 NOTE — Telephone Encounter (Signed)
Pt aware of transmission  

## 2019-03-16 NOTE — Telephone Encounter (Signed)
-----   Message from Adrian Prows, MD sent at 03/16/2019  6:58 AM EDT ----- Regarding: Pacemaker Scheduled Remote Pacemaker transmission 03/13/2019: : No high rate episodes. Normal lead trends. Health trends do not demonstrate significant abnormality. Battery longevity is 11-52 months. RA pacing is 46.2 %, RV pacing is 0.1 %.  Normal function.

## 2019-03-19 ENCOUNTER — Encounter: Payer: Self-pay | Admitting: Cardiology

## 2019-03-19 ENCOUNTER — Other Ambulatory Visit: Payer: Self-pay

## 2019-03-19 ENCOUNTER — Ambulatory Visit (INDEPENDENT_AMBULATORY_CARE_PROVIDER_SITE_OTHER): Payer: Medicare Other | Admitting: Cardiology

## 2019-03-19 VITALS — BP 133/72 | HR 88 | Ht 74.0 in | Wt 204.0 lb

## 2019-03-19 DIAGNOSIS — I25810 Atherosclerosis of coronary artery bypass graft(s) without angina pectoris: Secondary | ICD-10-CM | POA: Diagnosis not present

## 2019-03-19 DIAGNOSIS — Z950 Presence of cardiac pacemaker: Secondary | ICD-10-CM | POA: Diagnosis not present

## 2019-03-19 DIAGNOSIS — I2510 Atherosclerotic heart disease of native coronary artery without angina pectoris: Secondary | ICD-10-CM

## 2019-03-19 DIAGNOSIS — I480 Paroxysmal atrial fibrillation: Secondary | ICD-10-CM | POA: Diagnosis not present

## 2019-03-19 NOTE — Progress Notes (Signed)
Primary Physician/Referring:  Deland Pretty, MD  Patient ID: Trinten Boudoin, male    DOB: [birth year], 78 y.o.   MRN: [id number]  Chief Complaint  Patient presents with  . Hypertension  . Coronary Artery Disease  . Follow-up   HPI: Jesse [name]  is a 78 y.o. male  with coronary artery disease and CABG in 2007 in [place] with LIMA to LAD, SVG to OM which is occluded by angiography on 02/04/2015, occluded SVG to RCA. He underwent complex successful arthrectomy and PCI to Cx on 11/29/2017. His past medical history also includes sick sinus syndrome status post Medtronic pacemaker implantation 2011 for marked bradycardia and sick sinus syndrome, diabetes mellitus, hypertension and hyperlipidemia.  He has has sustained A. Flutter (atypical) in Feb 2020 on pacemaker transmission and has been on Xarelto since. .  Patient states that he is asymptomatic with regard to palpitations and feels well and no change in overall symptoms.  Atrial fibrillation episode occurred on 09/05/2018. No recent angina, no dyspnea. No bleeding diathesis on Xarelto.   Past Medical History:  Diagnosis Date  . CAD (coronary artery disease), native coronary artery 08/30/2018   2007 [place], [place]: LIMA to LAD, Occluded SVG to RCA and OM by angiogram S/P  PCI/orbital atherectomy S/P 2.5x15 Resoluute to prox Cx on 11/29/2017.  Marland Kitchen Coronary artery disease   . High cholesterol   . Hypertension   . Myocardial infarction [facility]) 2017/2018?  . NSTEMI (non-ST elevated myocardial infarction) ([place]) 02/03/2015  . Presence of permanent cardiac pacemaker   . Type II diabetes mellitus ([place])     Past Surgical History:  Procedure Laterality Date  . CARDIAC CATHETERIZATION N/A 02/04/2015   Procedure: Left Heart Cath and Cors/Grafts Angiography;  Surgeon: Adrian Prows, MD;  Location: [place] [facility];  Service: Cardiovascular;  Laterality: N/A;  . CARDIAC CATHETERIZATION N/A 02/04/2015   Procedure: Coronary Stent Intervention;  Surgeon: Adrian Prows, MD;  Location: [place] [facility];  Service: Cardiovascular;  Laterality: N/A;  . CORONARY ANGIOGRAPHY N/A 10/18/2017   Procedure: CORONARY ANGIOGRAPHY;  Surgeon: Adrian Prows, MD;  Location: [place] [facility];  Service: Cardiovascular;  Laterality: N/A;  . CORONARY ANGIOPLASTY    . CORONARY ANGIOPLASTY WITH STENT PLACEMENT     "i've got 5-6 stents total" (08/30/2017)  . CORONARY ARTERY BYPASS GRAFT  2007   CABG X3  . CORONARY ATHERECTOMY N/A 10/18/2017   Procedure: CORONARY ATHERECTOMY;  Surgeon: Adrian Prows, MD;  Location: [place] [facility];  Service: Cardiovascular;  Laterality: N/A;  . CORONARY ATHERECTOMY N/A 11/29/2017   Procedure: CORONARY ATHERECTOMY;  Surgeon: Adrian Prows, MD;  Location: [place] [facility];  Service: Cardiovascular;  Laterality: N/A;  . CORONARY CTO INTERVENTION N/A 08/30/2017   Procedure: CORONARY CTO INTERVENTION;  Surgeon: Adrian Prows, MD;  Location: [place] [facility];  Service: Cardiovascular;  Laterality: N/A;  . CORONARY STENT INTERVENTION N/A 11/29/2017   Procedure: CORONARY STENT INTERVENTION;  Surgeon: Adrian Prows, MD;  Location: [place] [facility];  Service: Cardiovascular;  Laterality: N/A;  . INSERT / REPLACE / REMOVE PACEMAKER  ?2012   "I think it was placed in 2012"  . LEFT HEART CATH AND CORONARY ANGIOGRAPHY N/A 08/30/2017   Procedure: LEFT HEART CATH AND CORONARY ANGIOGRAPHY;  Surgeon: Adrian Prows, MD;  Location: [place] [facility];  Service: Cardiovascular;  Laterality: N/A;  . LEFT HEART CATH AND CORS/GRAFTS ANGIOGRAPHY N/A 08/05/2017   Procedure: LEFT HEART CATH AND CORS/GRAFTS ANGIOGRAPHY;  Surgeon: Adrian Prows,  MD;  Location: [place] [facility];  Service: Cardiovascular;  Laterality: N/A;    Social History   Socioeconomic History  . Marital status: Widowed    Spouse name: Not on file  . Number of children: 0  . Years of education: Not on file  . Highest education level: Not on file  Occupational History  . Not on file  Social Needs  . Financial  resource strain: Not on file  . Food insecurity    Worry: Not on file    Inability: Not on file  . Transportation needs    Medical: Not on file    Non-medical: Not on file  Tobacco Use  . Smoking status: Former Smoker    Years: 3.00    Types: Cigarettes    Quit date: 1960    Years since quitting: 60.6  . Smokeless tobacco: Never Used  Substance and Sexual Activity  . Alcohol use: No  . Drug use: No  . Sexual activity: Yes  Lifestyle  . Physical activity    Days per week: Not on file    Minutes per session: Not on file  . Stress: Not on file  Relationships  . Social Herbalist on phone: Not on file    Gets together: Not on file    Attends religious service: Not on file    Active member of club or organization: Not on file    Attends meetings of clubs or organizations: Not on file    Relationship status: Not on file  . Intimate partner violence    Fear of current or ex partner: Not on file    Emotionally abused: Not on file    Physically abused: Not on file    Forced sexual activity: Not on file  Other Topics Concern  . Not on file  Social History Narrative  . Not on file    Current Outpatient Medications on File Prior to Visit  Medication Sig Dispense Refill  . Biotin 5000 MCG TABS Take 5,000 mcg by mouth daily.    . dorzolamide-timolol (COSOPT) 22.3-6.8 MG/ML ophthalmic solution Place 1 drop into both eyes 2 (two) times daily.  10  . glimepiride (AMARYL) 2 MG tablet Take 2 mg by mouth 2 (two) times daily.    Marland Kitchen latanoprost (XALATAN) 0.005 % ophthalmic solution Place 1 drop into both eyes at bedtime.  12  . losartan (COZAAR) 25 MG tablet TAKE 1 TABLET BY MOUTH DAILY 90 tablet 1  . metFORMIN (GLUCOPHAGE-XR) 500 MG 24 hr tablet Take 1 tablet (500 mg total) by mouth daily.  1  . metoprolol tartrate (LOPRESSOR) 25 MG tablet TAKE 1 TABLET BY MOUTH TWICE DAILY 180 tablet 3  . nitroGLYCERIN (NITROSTAT) 0.4 MG SL tablet ONE TABLET UNDER TONGUE AS NEEDED FOR CHEST  PAIN 25 tablet 2  . rivaroxaban (XARELTO) 20 MG TABS tablet Take 1 tablet (20 mg total) by mouth daily with supper. 90 tablet 11  . simvastatin (ZOCOR) 40 MG tablet Take 40 mg by mouth daily.    . vitamin B-12 (CYANOCOBALAMIN) 1000 MCG tablet Take 1,000 mcg by mouth daily.    . TRUE METRIX BLOOD GLUCOSE TEST test strip TEST 3 TIMES D     No current facility-administered medications on file prior to visit.    Review of Systems  Constitutional: Negative for malaise/fatigue and weight loss.  Respiratory: Negative for cough, hemoptysis and shortness of breath.   Cardiovascular: Positive for chest pain (angina, stable). Negative for  palpitations, claudication and leg swelling.  Gastrointestinal: Negative for abdominal pain, blood in stool, constipation, heartburn and vomiting.  Genitourinary: Negative for dysuria.  Musculoskeletal: Negative for joint pain and myalgias.       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 Negative for dizziness and headaches.  Endo/Heme/Allergies: Does not bruise/bleed easily.  Psychiatric/Behavioral: Negative for depression. The patient is not nervous/anxious.   All other systems reviewed and are negative.  Objective  Blood pressure 133/72, pulse 88, height '6\' 2"'  (1.88 m), weight 204 lb (92.5 kg), SpO2 99 %. Body mass index is 26.19 kg/m.   Physical Exam  Constitutional: He appears well-developed and well-nourished. No distress.  HENT:  Head: Atraumatic.  Eyes: Conjunctivae are normal.  Neck: Neck supple. No JVD present. No thyromegaly present.  Cardiovascular: Normal rate, regular rhythm, normal heart sounds and intact distal pulses. Exam reveals no gallop.  No murmur heard. Pulmonary/Chest: Effort normal and breath sounds normal.  Abdominal: Soft. Bowel sounds are normal.  Musculoskeletal: Normal range of motion.  Neurological: He is alert.  Skin: Skin is warm and  dry.  Psychiatric: He has a normal mood and affect.   Radiology: No results found.  Laboratory examination:   Labs 11/23/2018: PSA normal.  Hb 14.3/HCT 41.8, platelets 223.  Serum glucose 127 mg, BUN 18, creatinine 1.1, EGFR 68 mL, potassium 4.6, CMP otherwise normal.  A1c 6.6%. Total cholesterol 112, triglycerides 126, HDL 29, LDL 58.  Non-HDL cholesterol 83.  CMP Latest Ref Rng & Units 09/03/2018 09/02/2018 11/30/2017  Glucose 70 - 99 mg/dL 114(H) 241(H) 182(H)  BUN 8 - 23 mg/dL '16 20 14  ' Creatinine 0.61 - 1.24 mg/dL 1.18 1.33(H) 1.00  Sodium 135 - 145 mmol/L 137 135 136  Potassium 3.5 - 5.1 mmol/L 3.9 4.0 4.0  Chloride 98 - 111 mmol/L 108 105 110  CO2 22 - 32 mmol/L 19(L) 19(L) 21(L)  Calcium 8.9 - 10.3 mg/dL 8.1(L) 8.6(L) 8.1(L)  Total Protein 6.5 - 8.1 g/dL - 5.9(L) -  Total Bilirubin 0.3 - 1.2 mg/dL - 1.1 -  Alkaline Phos 38 - 126 U/L - 52 -  AST 15 - 41 U/L - 19 -  ALT 0 - 44 U/L - 17 -   CBC Latest Ref Rng & Units 09/03/2018 09/02/2018 09/02/2018  WBC 4.0 - 10.5 K/uL 4.2 7.2 10.9(H)  Hemoglobin 13.0 - 17.0 g/dL 12.3(L) 12.4(L) 12.9(L)  Hematocrit 39.0 - 52.0 % 37.4(L) 36.3(L) 38.2(L)  Platelets 150 - 400 K/uL PLATELET CLUMPS NOTED ON SMEAR, COUNT APPEARS DECREASED PLATELET CLUMPS NOTED ON SMEAR, UNABLE TO ESTIMATE 123(L)   Lipid Panel     Component Value Date/Time   CHOL 108 02/05/2015 1119   TRIG 226 (H) 02/05/2015 1119   HDL 21 (L) 02/05/2015 1119   CHOLHDL 5.1 02/05/2015 1119   VLDL 45 (H) 02/05/2015 1119   LDLCALC 42 02/05/2015 1119   HEMOGLOBIN A1C No results found for: HGBA1C, MPG TSH No results for input(s): TSH in the last 8760 hours.  Cardiac Studies:   Coronary Angiogram05/01/2018: ([place]): LIMA to LAD, Occluded SVG to RCA and OM by angiogram S/P PCI/orbital atherectomy S/P 2.5x15 Resoluute to prox Cx on 11/29/2017.  Echocardiogram  08/24/2017: Left ventricle cavity is normal in size. Moderate concentric hypertrophy of the left  ventricle. Visual EF is 45-50%. Abnormal septal wall motion due to post-operative coronary artery bypass graft. Doppler evidence of grade I (impaired)  diastolic dysfunction, normal LAP. Mild (Grade I) aortic regurgitation. Mild (Grade I) mitral regurgitation. Inadequate tricuspid regurgitation jet to estimate pulmonary artery pressure. Normal right atrial pressure. Compared to prior study dated 17/47/1595, LV systolic function is mildly reduced.  Nuclear stress test [02/14/2017]: 1. The resting electrocardiogram demonstrated normal sinus rhythm and incomplete RBBB. Inferior and posterior infarct, old. Stress EKG is non-diagnostic for ischemia as it a pharmacologic stress using Lexiscan. Stress symptoms included dyspnea. Patient unable to complete stress test due to fatigue and switched to [place]. 2. Myocardial perfusion imaging is abnormal. There is a moderate area of infarction in the basal inferior, mid inferior and apical inferior myocardial wall(s). Gated SPECT imaging demonstrates akinesis of the basal inferior, mid inferior and apical inferior myocardial wall(s). The left ventricular ejection fraction was calculated or visually estimated to be 38%. This is an intermediate risk study, clinical correlation recommended.  Assessment   Coronary artery disease involving native coronary artery of native heart without angina pectoris - Plan: EKG 12-Lead  CAD of autologous vein bypass graft without angina - Plan: EKG 12-Lead  PAF (paroxysmal atrial fibrillation) (HCC) CHA2DS2-VASc Score is 4. Yearly risk of stroke 4  - Plan: EKG 12-Lead  Pacemaker Medtronic dual chamber adapta [id number]   - Plan: EKG 12-Lead  EKG 03/19/2019: Atrially paced rhythm with first-degree AV block at the rate of 86 bpm, left axis deviation, left anterior fascicular block..  Nonspecific T abnormality. No significant change from  EKG 09/07/2018    Scheduled Remote Pacemaker transmission 03/13/2019: : No high rate  episodes. Normal lead trends. Health trends do not demonstrate significant abnormality. Battery longevity is 11-52 months. RA pacing is 46.2 %, RV pacing is 0.1 %.  Recommendations:   Patient is here on a three-month office visit and follow-up of paroxysmal atrial fibrillation, stable coronary artery disease and hypertension.  He is presently doing well and essentially remains asymptomatic.  Blood pressure is well controlled, he is not due to any sublingual nitroglycerin in the recent past.  I do not have his recent lipid profile testing, otherwise his risk factors are well controlled and is on appropriate medical therapy.  Advised him to discontinue aspirin as he is on Xarelto for atrial fibrillation and coronary artery disease is stable.  Otherwise no changes in her medications were done today.  I'll see him back in 6 months for follow-up.  I reviewed the data from the pacemaker transmission, normal pacemaker function.  I reviewed his labs performed in April 2020, labs are within normal limits including lipids.  Adrian Prows, MD, [facility] 03/19/2019, 9:11 AM [facility]. [place] Pager: [phone] Office: [phone] If no answer Cell [phone]

## 2019-05-04 DIAGNOSIS — Z23 Encounter for immunization: Secondary | ICD-10-CM | POA: Diagnosis not present

## 2019-05-06 ENCOUNTER — Other Ambulatory Visit: Payer: Self-pay | Admitting: Cardiology

## 2019-06-10 ENCOUNTER — Encounter: Payer: Self-pay | Admitting: Cardiology

## 2019-06-12 ENCOUNTER — Telehealth: Payer: Self-pay

## 2019-06-12 NOTE — Telephone Encounter (Signed)
-----   Message from Adrian Prows, MD sent at 06/10/2019  5:10 PM EST ----- Regarding: Pacemaker Normal pacemaker function. No atrial fib noted. Continue anticoagulation.  JG

## 2019-06-12 NOTE — Telephone Encounter (Signed)
Pt aware.

## 2019-08-20 ENCOUNTER — Other Ambulatory Visit: Payer: Self-pay | Admitting: Cardiology

## 2019-08-20 DIAGNOSIS — I480 Paroxysmal atrial fibrillation: Secondary | ICD-10-CM

## 2019-09-11 ENCOUNTER — Telehealth: Payer: Self-pay

## 2019-09-11 DIAGNOSIS — I495 Sick sinus syndrome: Secondary | ICD-10-CM | POA: Diagnosis not present

## 2019-09-11 DIAGNOSIS — Z45018 Encounter for adjustment and management of other part of cardiac pacemaker: Secondary | ICD-10-CM | POA: Diagnosis not present

## 2019-09-11 DIAGNOSIS — Z950 Presence of cardiac pacemaker: Secondary | ICD-10-CM | POA: Diagnosis not present

## 2019-09-11 NOTE — Telephone Encounter (Signed)
Called patient, LMAM to call back for Pacemaker results.

## 2019-09-11 NOTE — Telephone Encounter (Signed)
-----   Message from Adrian Prows, MD sent at 09/08/2019  3:01 PM EST ----- Regarding: Pacemaker Normal pacemaker function. Sorry I didi not put that in my previous message  JG

## 2019-09-11 NOTE — Telephone Encounter (Signed)
-----   Message from Adrian Prows, MD sent at 09/08/2019  3:00 PM EST ----- Regarding: Pacemaker Needs device check in the office. Add to list.   Scheduled Remote Pacemaker transmission 09/04/2019:  There were 34 automatic mode switch episodes detected since 08/31/19. The longest lasted 00:02:2:08 in duration. Review of available EGM demonstrates paroxysmal atrial fibrillation. There was a <0.1 % cumulative atrial arrhythmia burden. There were 0 high ventricular rate episodes detected since 08/31/19. Battery longevity is 27 months. RA pacing is 48.1 %, RV pacing is 0.1 %.  Normal pacemaker function  JG

## 2019-09-11 NOTE — Telephone Encounter (Signed)
Please read both messages.

## 2019-09-12 NOTE — Telephone Encounter (Signed)
Called and spoke with patient regarding his pacemaker results.

## 2019-09-12 NOTE — Telephone Encounter (Signed)
Patient called back, I have discussed pacemaker results with patient.

## 2019-09-20 ENCOUNTER — Encounter: Payer: Self-pay | Admitting: Cardiology

## 2019-09-20 ENCOUNTER — Ambulatory Visit: Payer: Medicare HMO | Admitting: Cardiology

## 2019-09-20 VITALS — BP 134/79 | HR 79 | Temp 97.9°F | Resp 16 | Ht 74.0 in | Wt 194.9 lb

## 2019-09-20 DIAGNOSIS — I480 Paroxysmal atrial fibrillation: Secondary | ICD-10-CM

## 2019-09-20 DIAGNOSIS — E782 Mixed hyperlipidemia: Secondary | ICD-10-CM

## 2019-09-20 DIAGNOSIS — I10 Essential (primary) hypertension: Secondary | ICD-10-CM | POA: Diagnosis not present

## 2019-09-20 DIAGNOSIS — Z950 Presence of cardiac pacemaker: Secondary | ICD-10-CM | POA: Diagnosis not present

## 2019-09-20 DIAGNOSIS — I2510 Atherosclerotic heart disease of native coronary artery without angina pectoris: Secondary | ICD-10-CM | POA: Diagnosis not present

## 2019-09-20 NOTE — Progress Notes (Signed)
Primary Physician/Referring:  Deland Pretty, MD  Patient ID: Jesse [name], male    DOB: 1941/02/17, 79 y.o.   MRN: [id number]  Chief Complaint  Patient presents with  . Coronary Artery Disease  . Atrial Fibrillation  . Follow-up    6 month follow up   HPI:    Jesse [name]  is a 79 y.o. male  with coronary artery disease and CABG in 2007 in [place] with LIMA to LAD, SVG to OM which is occluded by angiography on 02/04/2015, occluded SVG to RCA. Jesse [name] underwent complex successful arthrectomy and PCI to Cx on 11/29/2017. His past medical history also includes sick sinus syndrome status post Medtronic pacemaker implantation 2011, PAF, diabetes mellitus, hypertension and hyperlipidemia.  Jesse [name] is here on a 18-monthoffice visit and follow-up.  Denies chest pain or shortness of breath, Jesse [name] has not used sublingual nitroglycerin in the recent past.  Denies any bleeding diathesis on Xarelto.  Past Medical History:  Diagnosis Date  . [place] p[place]is ([place] 02/03/2015  . CAD (coronary artery disease), native coronary artery 08/30/2018   [place] LAD, Occluded SVG to RCA and OM by angiogram S/P  PCI/orbital atherectomy S/P 2.5x15 Resoluute to prox Cx on 11/29/2017.  .Marland KitchenCoronary artery disease   . High cholesterol   . Hypertension   . Myocardial infarction ([facility] 2017/2018?  . NSTEMI (non-ST elevated myocardial infarction) ([place] 02/03/2015  . Presence of permanent cardiac pacemaker   . Type II diabetes mellitus ([place]    Past Surgical History:  Procedure Laterality Date  . CARDIAC CATHETERIZATION N/A 02/04/2015   Procedure: Left Heart Cath and Cors/Grafts Angiography;  Surgeon: JAdrian Prows MD;  [facility];  Service: Cardiovascular;  Laterality: N/A;  . CARDIAC CATHETERIZATION N/A 02/04/2015   Procedure: Coronary Stent Intervention;  Surgeon: JAdrian Prows MD;  [place];  Service: Cardiovascular;  Laterality: N/A;  . CORONARY ANGIOGRAPHY N/A 10/18/2017   Procedure: CORONARY ANGIOGRAPHY;  Surgeon: GAdrian Prows MD;  [place] [facility];  Service: Cardiovascular;  Laterality: N/A;  . CORONARY ANGIOPLASTY    . CORONARY ANGIOPLASTY WITH STENT PLACEMENT     "i've got 5-6 stents total" (08/30/2017)  . CORONARY ARTERY BYPASS GRAFT  2007   CABG X3  . CORONARY ATHERECTOMY N/A 10/18/2017   Procedure: CORONARY ATHERECTOMY;  Surgeon: GAdrian Prows MD;  [place];  Service: Cardiovascular;  Laterality: N/A;  . CORONARY ATHERECTOMY N/A 11/29/2017   Procedure: CORONARY ATHERECTOMY;  Surgeon: GAdrian Prows MD;  [place] [facility];  Service: Cardiovascular;  Laterality: N/A;  . CORONARY CTO INTERVENTION N/A 08/30/2017   Procedure: CORONARY CTO INTERVENTION;  Surgeon: GAdrian Prows MD;  Location: [facility];  Service: Cardiovascular;  Laterality: N/A;  . CORONARY STENT INTERVENTION N/A 11/29/2017   Procedure: CORONARY STENT INTERVENTION;  Surgeon: GAdrian Prows MD;  [facility];  Service: Cardiovascular;  Laterality: N/A;  . INSERT / REPLACE / REMOVE PACEMAKER  ?2012   "I think it was placed in 2012"  . LEFT HEART CATH AND CORONARY ANGIOGRAPHY N/A 08/30/2017   Procedure: LEFT HEART CATH AND CORONARY ANGIOGRAPHY;  Surgeon: GAdrian Prows MD;  [facility];  Service: Cardiovascular;  Laterality: N/A;  . LEFT HEART CATH AND CORS/GRAFTS ANGIOGRAPHY N/A 08/05/2017   Procedure: LEFT HEART CATH AND CORS/GRAFTS ANGIOGRAPHY;  Surgeon: GAdrian Prows MD;  [place];  Service: Cardiovascular;  Laterality: N/A;  Family History  Problem Relation Age of Onset  . Lung cancer Mother   . Stroke Father     Social History   Tobacco Use  . Smoking status: Former Smoker    Years: 3.00    Types: Cigarettes    Quit date: 1960    Years since quitting: 61.1  . Smokeless tobacco: Never Used  Substance Use Topics  . Alcohol use: No   ROS  Review of Systems  Cardiovascular: Negative for chest pain,  dyspnea on exertion and leg swelling.  Gastrointestinal: Negative for melena.   Objective  Blood pressure 134/79, pulse 79, temperature 97.9 F (36.6 C), temperature source Temporal, resp. rate 16, height '6\' 2"'  (1.88 m), weight 194 lb 14.4 oz (88.4 kg), SpO2 98 %.  Vitals with BMI 09/20/2019 03/19/2019 12/15/2018  Height '6\' 2"'  '6\' 2"'  '6\' 2"'   Weight 194 lbs 14 oz 204 lbs 200 lbs  BMI 25.01 20.94 70.96  Systolic 283 662 947  Diastolic 79 72 70  Pulse 79 88 86     Physical Exam  Constitutional: Jesse [name] appears well-developed and well-nourished.  Cardiovascular: Normal rate, regular rhythm, normal heart sounds and intact distal pulses. Exam reveals no gallop.  No murmur heard. No leg edema, no JVD.  Pulmonary/Chest: Effort normal and breath sounds normal.  Abdominal: Soft. Bowel sounds are normal.   Laboratory examination:   External labs: Labs 11/23/2018: PSA normal.  Hb 14.3/HCT 41.8, platelets 223.  Serum glucose 127 mg, BUN 18, creatinine 1.1, EGFR 68 mL, potassium 4.6, CMP otherwise normal.  A1c 6.6%. Total cholesterol 112, triglycerides 126, HDL 29, LDL 58.  Non-HDL cholesterol 83.  Medications and allergies   Allergies  Allergen Reactions  . Brilinta [Ticagrelor]     Difficulty breathing      Current Outpatient Medications  Medication Instructions  . Biotin 5,000 mcg, Oral, Daily  . dorzolamide-timolol (COSOPT) 22.3-6.8 MG/ML ophthalmic solution 1 drop, Both Eyes, 2 times daily  . glimepiride (AMARYL) 2 mg, Oral, 2 times daily  . latanoprost (XALATAN) 0.005 % ophthalmic solution 1 drop, Both Eyes, Daily at bedtime  . losartan (COZAAR) 25 MG tablet TAKE 1 TABLET BY MOUTH DAILY  . metFORMIN (GLUCOPHAGE-XR) 500 mg, Oral, Daily  . metoprolol tartrate (LOPRESSOR) 25 MG tablet TAKE 1 TABLET BY MOUTH TWICE DAILY  . nitroGLYCERIN (NITROSTAT) 0.4 MG SL tablet ONE TABLET UNDER TONGUE AS NEEDED FOR CHEST PAIN  . simvastatin (ZOCOR) 40 mg, Oral, Daily  . TRUE METRIX BLOOD GLUCOSE TEST  test strip TEST 3 TIMES D  . vitamin B-12 (CYANOCOBALAMIN) 1,000 mcg, Oral, Daily  . XARELTO 20 MG TABS tablet TAKE 1 TABLET(20 MG) BY MOUTH DAILY WITH SUPPER    Radiology:   No results found.  Cardiac Studies:   Coronary Angiogram05/01/2018: ([place]): LIMA to LAD, Occluded SVG to RCA and OM by angiogram S/P PCI/orbital atherectomy S/P 2.5x15 Resoluute to prox Cx on 11/29/2017.  Echocardiogram  08/24/2017: Left ventricle cavity is normal in size. Moderate concentric hypertrophy of the left ventricle. Visual EF is 45-50%. Abnormal septal wall motion due to post-operative coronary artery bypass graft. Doppler evidence of grade I (impaired) diastolic dysfunction, normal LAP. Mild (Grade I) aortic regurgitation. Mild (Grade I) mitral regurgitation. Inadequate tricuspid regurgitation jet to estimate pulmonary artery pressure. Normal right atrial pressure. Compared to prior study dated 65/46/5035, LV systolic function is mildly reduced.  Nuclear stress test [02/14/2017]: 1. The resting electrocardiogram demonstrated normal sinus rhythm and incomplete RBBB. Inferior and posterior infarct, old.  Stress EKG is non-diagnostic for ischemia as it a pharmacologic stress using Lexiscan. Stress symptoms included dyspnea. Patient unable to complete stress test due to fatigue and switched to [place]. 2. Myocardial perfusion imaging is abnormal. There is a moderate area of infarction in the basal inferior, mid inferior and apical inferior myocardial wall(s). Gated SPECT imaging demonstrates akinesis of the basal inferior, mid inferior and apical inferior myocardial wall(s). The left ventricular ejection fraction was calculated or visually estimated to be 38%. This is an intermediate risk study, clinical correlation recommended.  Scheduled Remote Pacemaker transmission 09/04/2019:  There were 34 automatic mode switch episodes detected since 08/31/19. The longest lasted 00:02:2:08 in  duration. Review of available EGM demonstrates paroxysmal atrial fibrillation. There was a <0.1 % cumulative atrial arrhythmia burden. There were 0 high ventricular rate episodes detected since 08/31/19. Battery longevity is 27 months. RA pacing is 48.1 %, RV pacing is 0.1 %.  EKG 09/20/2019: Atrially paced rhythm at rate of 78 bpm, left axis deviation, left anterior fascicular block.  Right bundle branch block.  No evidence of ischemia.   No significant change from  EKG 03/19/2019   Assessment     ICD-10-CM   1. PAF (paroxysmal atrial fibrillation) ([place]). CHA2DS2-VASc Score is 5 (A, HTN, DM Vasc Dz). Yearly risk of stroke 6.7%  I48.0 EKG 12-Lead  2. Coronary artery disease involving native coronary artery of native heart without angina pectoris  I25.10   3. Pacemaker Medtronic dual chamber adapta ADDR01 on 11/20/2009 in [place].  Z95.0   4. Essential hypertension  I10   5. Mixed hyperlipidemia  E78.2     No orders of the defined types were placed in this encounter.   There are no discontinued medications.   Recommendations:   Jesse [name]  is a 79 y.o. male  with coronary artery disease and CABG in 2007 in [place] with LIMA to LAD, SVG to OM which is occluded by angiography on 02/04/2015, occluded SVG to RCA. Jesse [name] underwent complex successful arthrectomy and PCI to Cx on 11/29/2017. His past medical history also includes sick sinus syndrome status post Medtronic pacemaker implantation 2011, PAF, diabetes mellitus, hypertension and hyperlipidemia.  Jesse [name] is presently doing well and has not had any recurrence of angina pectoris, is tolerating anticoagulation without any bleeding diathesis.  Blood pressures well controlled, I reviewed his external labs, lipids are under excellent control as well.  Diabetes continues to be well controlled as well.  Also structures are well controlled, I reviewed his pacemaker transmission, Jesse [name] still has approximately 2+ years of life.  I will set him up for a device check,  otherwise no changes in the medications were done today.  I will see him back in 6 months.  Adrian Prows, MD, [facility] 09/20/2019, 11:41 AM [facility]. [place] Office: [phone]

## 2019-11-02 ENCOUNTER — Other Ambulatory Visit: Payer: Self-pay | Admitting: Cardiology

## 2019-11-28 DIAGNOSIS — E118 Type 2 diabetes mellitus with unspecified complications: Secondary | ICD-10-CM | POA: Diagnosis not present

## 2019-11-28 DIAGNOSIS — E782 Mixed hyperlipidemia: Secondary | ICD-10-CM | POA: Diagnosis not present

## 2019-12-05 DIAGNOSIS — I517 Cardiomegaly: Secondary | ICD-10-CM | POA: Diagnosis not present

## 2019-12-05 DIAGNOSIS — I480 Paroxysmal atrial fibrillation: Secondary | ICD-10-CM | POA: Diagnosis not present

## 2019-12-05 DIAGNOSIS — Z950 Presence of cardiac pacemaker: Secondary | ICD-10-CM | POA: Diagnosis not present

## 2019-12-05 DIAGNOSIS — Z955 Presence of coronary angioplasty implant and graft: Secondary | ICD-10-CM | POA: Diagnosis not present

## 2019-12-05 DIAGNOSIS — E782 Mixed hyperlipidemia: Secondary | ICD-10-CM | POA: Diagnosis not present

## 2019-12-05 DIAGNOSIS — L570 Actinic keratosis: Secondary | ICD-10-CM | POA: Diagnosis not present

## 2019-12-05 DIAGNOSIS — I10 Essential (primary) hypertension: Secondary | ICD-10-CM | POA: Diagnosis not present

## 2019-12-05 DIAGNOSIS — Z0000 Encounter for general adult medical examination without abnormal findings: Secondary | ICD-10-CM | POA: Diagnosis not present

## 2019-12-05 DIAGNOSIS — E118 Type 2 diabetes mellitus with unspecified complications: Secondary | ICD-10-CM | POA: Diagnosis not present

## 2019-12-11 ENCOUNTER — Ambulatory Visit: Payer: PRIVATE HEALTH INSURANCE | Admitting: Cardiology

## 2019-12-12 ENCOUNTER — Other Ambulatory Visit: Payer: Self-pay | Admitting: Cardiology

## 2019-12-12 DIAGNOSIS — I480 Paroxysmal atrial fibrillation: Secondary | ICD-10-CM

## 2019-12-13 DIAGNOSIS — H401132 Primary open-angle glaucoma, bilateral, moderate stage: Secondary | ICD-10-CM | POA: Diagnosis not present

## 2019-12-13 DIAGNOSIS — E119 Type 2 diabetes mellitus without complications: Secondary | ICD-10-CM | POA: Diagnosis not present

## 2019-12-13 DIAGNOSIS — H25812 Combined forms of age-related cataract, left eye: Secondary | ICD-10-CM | POA: Diagnosis not present

## 2019-12-13 DIAGNOSIS — H2511 Age-related nuclear cataract, right eye: Secondary | ICD-10-CM | POA: Diagnosis not present

## 2019-12-13 DIAGNOSIS — H04123 Dry eye syndrome of bilateral lacrimal glands: Secondary | ICD-10-CM | POA: Diagnosis not present

## 2019-12-17 NOTE — Progress Notes (Signed)
Primary Physician/Referring:  Deland Pretty, MD  Patient ID: Jesse [name], male    DOB: [birth year], 79 y.o.   MRN: [id number]  Chief Complaint  Patient presents with  . Pacemaker Check    Medtronic  . Coronary Artery Disease  . Atrial Fibrillation   HPI:    Jesse [name]  is a 79 y.o. male  with coronary artery disease and CABG in 2007 in [place] with LIMA to LAD, SVG to OM which is occluded by angiography on 02/04/2015, occluded SVG to RCA. He underwent complex successful arthrectomy and PCI to Cx on 11/29/2017. His past medical history also includes sick sinus syndrome status post Medtronic pacemaker implantation 2011, PAF, diabetes mellitus, hypertension and hyperlipidemia.  He is here on a 53-monthoffice visit and follow-up.  Denies chest pain or shortness of breath, he has not used sublingual nitroglycerin in the recent past.  Denies any bleeding diathesis on Xarelto.  Past Medical History:  Diagnosis Date  . Angina pectoris ([place] 02/03/2015  . CAD (coronary artery disease), native coronary artery 08/30/2018   [place], Occluded SVG to RCA and OM by angiogram S/P  PCI/orbital atherectomy S/P 2.5x15 Resoluute to prox Cx on 11/29/2017.  .Marland KitchenCoronary artery disease   . High cholesterol   . Hypertension   . Myocardial infarction ([facility] 2017/2018?  . NSTEMI (non-ST elevated myocardial infarction) ([place] 02/03/2015  . Presence of permanent cardiac pacemaker   . Type II diabetes mellitus ([place]    Past Surgical History:  Procedure Laterality Date  . CARDIAC CATHETERIZATION N/A 02/04/2015   Procedure: Left Heart Cath and Cors/Grafts Angiography;  Surgeon: JAdrian Prows MD;  [facility];  Service: Cardiovascular;  Laterality: N/A;  . CARDIAC CATHETERIZATION N/A 02/04/2015   Procedure: Coronary Stent Intervention;  Surgeon: JAdrian Prows MD;  [facility]: [facility];  Service: Cardiovascular;  Laterality: N/A;  . CORONARY ANGIOGRAPHY N/A 10/18/2017    Procedure: CORONARY ANGIOGRAPHY;  Surgeon: GAdrian Prows MD;  [facility];  Service: Cardiovascular;  Laterality: N/A;  . CORONARY ANGIOPLASTY    . CORONARY ANGIOPLASTY WITH STENT PLACEMENT     "i've got 5-6 stents total" (08/30/2017)  . CORONARY ARTERY BYPASS GRAFT  2007   CABG X3  . CORONARY ATHERECTOMY N/A 10/18/2017   Procedure: CORONARY ATHERECTOMY;  Surgeon: GAdrian Prows MD;  [place];  Service: Cardiovascular;  Laterality: N/A;  . CORONARY ATHERECTOMY N/A 11/29/2017   Procedure: CORONARY ATHERECTOMY;  Surgeon: GAdrian Prows MD;  [facility];  Service: Cardiovascular;  Laterality: N/A;  . CORONARY CTO INTERVENTION N/A 08/30/2017   Procedure: CORONARY CTO INTERVENTION;  Surgeon: GAdrian Prows MD;  [place] [facility];  Service: Cardiovascular;  Laterality: N/A;  . CORONARY STENT INTERVENTION N/A 11/29/2017   Procedure: CORONARY STENT INTERVENTION;  Surgeon: GAdrian Prows MD;  Location: [facility];  Service: Cardiovascular;  Laterality: N/A;  . INSERT / REPLACE / REMOVE PACEMAKER  ?2012   "I think it was placed in 2012"  . LEFT HEART CATH AND CORONARY ANGIOGRAPHY N/A 08/30/2017   Procedure: LEFT HEART CATH AND CORONARY ANGIOGRAPHY;  Surgeon: GAdrian Prows MD;  [facility];  Service: Cardiovascular;  Laterality: N/A;  . LEFT HEART CATH AND CORS/GRAFTS ANGIOGRAPHY N/A 08/05/2017   Procedure: LEFT HEART CATH AND CORS/GRAFTS ANGIOGRAPHY;  Surgeon: GAdrian Prows MD;  [facility];  Service: Cardiovascular;  Laterality: N/A;  Family History  Problem Relation Age of Onset  . Lung cancer Mother   . Stroke Father     Social History   Tobacco Use  . Smoking status: Former Smoker    Years: 3.00    Types: Cigarettes    Quit date: 1960    Years since quitting: 61.4  . Smokeless tobacco: Never Used  Substance Use Topics  . Alcohol use: No   Marital Status: Widowed ROS  Review of Systems  Cardiovascular:  Negative for chest pain, dyspnea on exertion and leg swelling.  Gastrointestinal: Negative for melena.   Objective  Blood pressure 130/69, pulse 68, resp. rate 16, height '6\' 2"'  (1.88 m), weight 198 lb (89.8 kg), SpO2 96 %.  Vitals with BMI 12/19/2019 09/20/2019 03/19/2019  Height '6\' 2"'  '6\' 2"'  '6\' 2"'   Weight 198 lbs 194 lbs 14 oz 204 lbs  BMI 25.41 19.14 78.29  Systolic 562 130 865  Diastolic 69 79 72  Pulse 68 79 88     Physical Exam  Constitutional: He appears well-developed and well-nourished.  Cardiovascular: Normal rate, regular rhythm, normal heart sounds and intact distal pulses. Exam reveals no gallop.  No murmur heard. No leg edema, no JVD.  Pulmonary/Chest: Effort normal and breath sounds normal.  Pacemaker/ICD site noted  in the left infraclavicular fossa.    Abdominal: Soft. Bowel sounds are normal.   Laboratory examination:   External labs:  Labs 11/28/2019:  Hb 15.1/HCT 43.7, platelets 156.  Sodium 139, potassium 4.1, BUN 22, creatinine 1.1, EGFR 68 mL. CMP normal.  Total cholesterol 149, triglycerides 198, HDL 34, LDL 75, non-HDL cholesterol 115.  MicroAlbumin/Creat 11/28/2019  11/23/2018:  PSA normal.  Hb 14.3/HCT 41.8, platelets 223.  Serum glucose 127 mg, BUN 18, creatinine 1.1, EGFR 68 mL, potassium 4.6. CMP otherwise normal.   A1c 6.6%. Total cholesterol 112, triglycerides 126, HDL 29, LDL 58.  Non-HDL cholesterol 83.  TSH 1.890 UIU/ 01/31/2017  Medications and allergies   Allergies  Allergen Reactions  . Brilinta [Ticagrelor]     Difficulty breathing      Current Outpatient Medications  Medication Instructions  . Biotin 5,000 mcg, Oral, Daily  . dorzolamide-timolol (COSOPT) 22.3-6.8 MG/ML ophthalmic solution 1 drop, Both Eyes, 2 times daily  . glimepiride (AMARYL) 2 mg, Oral, 2 times daily  . latanoprost (XALATAN) 0.005 % ophthalmic solution 1 drop, Both Eyes, Daily at bedtime  . losartan (COZAAR) 25 MG tablet TAKE 1 TABLET BY MOUTH DAILY  .  metFORMIN (GLUCOPHAGE-XR) 500 mg, Oral, Daily  . metoprolol tartrate (LOPRESSOR) 25 MG tablet TAKE 1 TABLET BY MOUTH TWICE DAILY  . nitroGLYCERIN (NITROSTAT) 0.4 MG SL tablet ONE TABLET UNDER TONGUE AS NEEDED FOR CHEST PAIN  . simvastatin (ZOCOR) 40 mg, Oral, Daily  . TRUE METRIX BLOOD GLUCOSE TEST test strip TEST 3 TIMES D  . vitamin B-12 (CYANOCOBALAMIN) 1,000 mcg, Oral, Daily  . XARELTO 20 MG TABS tablet TAKE 1 TABLET(20 MG) BY MOUTH DAILY WITH SUPPER    Radiology:   No results found.  Cardiac Studies:   Nuclear stress test 02/14/2017: 1. The resting electrocardiogram demonstrated normal sinus rhythm and incomplete RBBB. Inferior and posterior infarct, old. Stress EKG is non-diagnostic for ischemia as it a pharmacologic stress using Lexiscan. Stress symptoms included dyspnea. Patient unable to complete stress test due to fatigue and switched to [place]. 2. Myocardial perfusion imaging is abnormal. There is a moderate area of infarction in the basal inferior, mid inferior and apical inferior myocardial wall(s). Gated  SPECT imaging demonstrates akinesis of the basal inferior, mid inferior and apical inferior myocardial wall(s). The left ventricular ejection fraction was calculated or visually estimated to be 38%. This is an intermediate risk study, clinical correlation recommended.  Echocardiogram 08/24/2017: Left ventricle cavity is normal in size. Moderate concentric hypertrophy of the left ventricle. Visual EF is 45-50%. Abnormal septal wall motion due to post-operative coronary artery bypass graft. Doppler evidence of grade I (impaired) diastolic dysfunction, normal LAP. Mild (Grade I) aortic regurgitation. Mild (Grade I) mitral regurgitation. Inadequate tricuspid regurgitation jet to estimate pulmonary artery pressure. Normal right atrial pressure. Compared to prior study dated 80/32/1224, LV systolic function is mildly reduced.  Coronary Angiogram05/01/2018: ([place]): LIMA to LAD, Occluded SVG to RCA and OM by angiogram S/P PCI/orbital atherectomy S/P 2.5x15 Resoluute to prox Cx on 11/29/2017.  Scheduled Remote Pacemaker transmission 09/04/2019:  There were 34 automatic mode switch episodes detected since 08/31/19. The longest lasted 00:02:2:08 in duration. Review of available EGM demonstrates paroxysmal atrial fibrillation. There was a <0.1 % cumulative atrial arrhythmia burden. There were 0 high ventricular rate episodes detected since 08/31/19. Battery longevity is 27 months. RA pacing is 48.1 %, RV pacing is 0.1 %.  Scheduled  In office pacemaker check 12/19/19  Single (NSR @ 69/min. Pacemaker dependant:  No. Underlying NSR. AP 49%, VP 1%. AMS Episodes 26.  AT/AF burden <0.1% . Longest 2 hours. Latest 09/06/2019. HVR 0.  Longevity 2.5 Years. Magnet rate: >85%. Lead measurements: Stable. Histogram: Low (L)/normal (N)/high (H)  Normal. Patient activity Good.   Observations: Normal pacemaker function. Changes: None.  EKG   09/20/2019: Atrially paced rhythm at rate of 78 bpm, left axis deviation, left anterior fascicular block.  Right bundle branch block.  No evidence of ischemia.   No significant change from  EKG 03/19/2019   Assessment     ICD-10-CM   1. Encounter for care of pacemaker  Z45.018   2. Pacemaker Medtronic dual chamber adapta ADDR01 on 11/20/2009 in [place].  Z95.0   3. Sick sinus syndrome (HCC)  I49.5   4. PAF (paroxysmal atrial fibrillation) ([place]). CHA2DS2-VASc Score is 5 (A, HTN, DM Vasc Dz). Yearly risk of stroke 6.7%  I48.0   5. Coronary artery disease involving native coronary artery of native heart without angina pectoris  I25.10   6. Essential hypertension  I10      No orders of the defined types were placed in this encounter.   There are no discontinued medications.   Recommendations:   Jesse [name]  is a 79 y.o. male  with coronary artery disease and CABG in 2007 in [place] with LIMA to LAD, SVG to OM which is occluded  by angiography on 02/04/2015, occluded SVG to RCA. He underwent complex successful arthrectomy and PCI to Cx on 11/29/2017. His past medical history also includes sick sinus syndrome status post Medtronic pacemaker implantation 2011, PAF, diabetes mellitus, hypertension and hyperlipidemia.  I reviewed the data from the pacemaker, pacemaker is functioning normally.  He does have indeed less than 1% atrial fibrillation burden, continue anticoagulation long-term.  Blood pressures well controlled, he has not had any recurrence of angina pectoris, his external labs reviewed, lipids are also well controlled.  No changes in the medications were done today.  I will see him back in 6 months for follow-up.  Adrian Prows, MD, [facility] 12/19/2019, 2:39 PM [facility] [place] Pager: [phone] Office: [phone]

## 2019-12-19 ENCOUNTER — Other Ambulatory Visit: Payer: Self-pay

## 2019-12-19 ENCOUNTER — Ambulatory Visit: Payer: PRIVATE HEALTH INSURANCE | Admitting: Cardiology

## 2019-12-19 ENCOUNTER — Encounter: Payer: Self-pay | Admitting: Cardiology

## 2019-12-19 VITALS — BP 130/69 | HR 68 | Resp 16 | Ht 74.0 in | Wt 198.0 lb

## 2019-12-19 DIAGNOSIS — I10 Essential (primary) hypertension: Secondary | ICD-10-CM

## 2019-12-19 DIAGNOSIS — Z45018 Encounter for adjustment and management of other part of cardiac pacemaker: Secondary | ICD-10-CM

## 2019-12-19 DIAGNOSIS — I480 Paroxysmal atrial fibrillation: Secondary | ICD-10-CM

## 2019-12-19 DIAGNOSIS — I495 Sick sinus syndrome: Secondary | ICD-10-CM

## 2019-12-19 DIAGNOSIS — Z950 Presence of cardiac pacemaker: Secondary | ICD-10-CM | POA: Diagnosis not present

## 2019-12-19 DIAGNOSIS — I2510 Atherosclerotic heart disease of native coronary artery without angina pectoris: Secondary | ICD-10-CM

## 2020-01-18 ENCOUNTER — Emergency Department (HOSPITAL_COMMUNITY)
Admission: EM | Admit: 2020-01-18 | Discharge: 2020-01-18 | Disposition: A | Discharge status: Home / Self Care | Payer: Medicare HMO | Attending: Emergency Medicine | Admitting: Emergency Medicine

## 2020-01-18 ENCOUNTER — Other Ambulatory Visit: Payer: Self-pay | Admitting: Cardiology

## 2020-01-18 ENCOUNTER — Encounter (HOSPITAL_COMMUNITY): Payer: Self-pay

## 2020-01-18 ENCOUNTER — Other Ambulatory Visit: Payer: Self-pay

## 2020-01-18 ENCOUNTER — Emergency Department (HOSPITAL_COMMUNITY): Payer: Medicare HMO

## 2020-01-18 DIAGNOSIS — I2510 Atherosclerotic heart disease of native coronary artery without angina pectoris: Secondary | ICD-10-CM | POA: Diagnosis not present

## 2020-01-18 DIAGNOSIS — R072 Precordial pain: Secondary | ICD-10-CM | POA: Diagnosis not present

## 2020-01-18 DIAGNOSIS — Z950 Presence of cardiac pacemaker: Secondary | ICD-10-CM | POA: Insufficient documentation

## 2020-01-18 DIAGNOSIS — Z79899 Other long term (current) drug therapy: Secondary | ICD-10-CM | POA: Insufficient documentation

## 2020-01-18 DIAGNOSIS — Z7984 Long term (current) use of oral hypoglycemic drugs: Secondary | ICD-10-CM | POA: Diagnosis not present

## 2020-01-18 DIAGNOSIS — R0789 Other chest pain: Secondary | ICD-10-CM | POA: Diagnosis not present

## 2020-01-18 DIAGNOSIS — J811 Chronic pulmonary edema: Secondary | ICD-10-CM | POA: Diagnosis not present

## 2020-01-18 DIAGNOSIS — R079 Chest pain, unspecified: Secondary | ICD-10-CM | POA: Diagnosis not present

## 2020-01-18 DIAGNOSIS — E119 Type 2 diabetes mellitus without complications: Secondary | ICD-10-CM | POA: Insufficient documentation

## 2020-01-18 DIAGNOSIS — Z9861 Coronary angioplasty status: Secondary | ICD-10-CM | POA: Insufficient documentation

## 2020-01-18 DIAGNOSIS — Z7901 Long term (current) use of anticoagulants: Secondary | ICD-10-CM | POA: Insufficient documentation

## 2020-01-18 DIAGNOSIS — I119 Hypertensive heart disease without heart failure: Secondary | ICD-10-CM | POA: Insufficient documentation

## 2020-01-18 DIAGNOSIS — I25118 Atherosclerotic heart disease of native coronary artery with other forms of angina pectoris: Secondary | ICD-10-CM

## 2020-01-18 DIAGNOSIS — Z951 Presence of aortocoronary bypass graft: Secondary | ICD-10-CM

## 2020-01-18 LAB — CBC
HCT: 43 % (ref 39.0–52.0)
Hemoglobin: 14.4 g/dL (ref 13.0–17.0)
MCH: 33.1 pg (ref 26.0–34.0)
MCHC: 33.5 g/dL (ref 30.0–36.0)
MCV: 98.9 fL (ref 80.0–100.0)
Platelets: 168 10*3/uL (ref 150–400)
RBC: 4.35 MIL/uL (ref 4.22–5.81)
RDW: 12.2 % (ref 11.5–15.5)
WBC: 6.7 10*3/uL (ref 4.0–10.5)
nRBC: 0 % (ref 0.0–0.2)

## 2020-01-18 LAB — TROPONIN I (HIGH SENSITIVITY)
Troponin I (High Sensitivity): 6 ng/L (ref <18)
Troponin I (High Sensitivity): 7 ng/L (ref <18)

## 2020-01-18 LAB — BASIC METABOLIC PANEL
Anion gap: 12 (ref 5–15)
BUN: 25 mg/dL — ABNORMAL HIGH (ref 8–23)
CO2: 21 mmol/L — ABNORMAL LOW (ref 22–32)
Calcium: 9.9 mg/dL (ref 8.9–10.3)
Chloride: 103 mmol/L (ref 98–111)
Creatinine, Ser: 1.03 mg/dL (ref 0.61–1.24)
GFR calc Af Amer: >60 mL/min (ref >60)
GFR calc non Af Amer: >60 mL/min (ref >60)
Glucose, Bld: 150 mg/dL — ABNORMAL HIGH (ref 70–99)
Potassium: 3.9 mmol/L (ref 3.5–5.1)
Sodium: 136 mmol/L (ref 135–145)

## 2020-01-18 NOTE — Discharge Instructions (Signed)
Please follow-up with your cardiologist in the office.  Please return to the emergency department if your pain worsens or if it does not improve with nitroglycerin.

## 2020-01-18 NOTE — ED Provider Notes (Signed)
[facility] EMERGENCY DEPARTMENT Provider Note   CSN: [id number] Arrival date & time: 01/18/20  0831     History Chief Complaint  Patient presents with  . Chest Pain    Jesse [name] is a 79 y.o. male.  79 yo M with a chief complaints of chest pain.  This is described as a pressure on his chest.  Feels like we had a heart attack in the past though less severe.  Not exertional no shortness of breath had some radiation to his armpit.  Took a nitroglycerin this morning and it resolved.  Going on for a couple days now.  Thought initially it was indigestion.  Denies any lower extremity edema denies cough denies fever denies abdominal pain nausea vomiting.  Denies diaphoresis.  The history is provided by the patient.  Chest Pain Pain location:  Substernal area Pain quality: pressure   Pain radiates to:  L shoulder Pain severity:  Moderate Onset quality:  Gradual Duration:  2 days Timing:  Intermittent Progression:  Resolved Chronicity:  Recurrent Relieved by:  Nothing Worsened by:  Nothing Ineffective treatments:  None tried Associated symptoms: no abdominal pain, no fever, no headache, no palpitations, no shortness of breath and no vomiting        Past Medical History:  Diagnosis Date  . 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 CAD (coronary artery disease), native coronary artery 08/30/2018   2007 [place], [place]: LIMA to LAD, Occluded SVG to RCA and OM by angiogram S/P  PCI/orbital atherectomy S/P 2.5x15 Resoluute to prox Cx on 11/29/2017.  Marland Kitchen Coronary artery disease   . High cholesterol   . Hypertension   . Myocardial infarction [facility]) 2017/2018?  . NSTEMI (non-ST elevated myocardial infarction) ([place]) 02/03/2015  . Presence of permanent cardiac pacemaker   . Type II diabetes mellitus [facility])     Patient Active Problem List   Diagnosis Date Noted  . Encounter for care of pacemaker 03/16/2019  . Coronary artery disease 12/15/2018  . 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 Flu-like symptoms 09/04/2018  . Troponin level elevated 09/02/2018  . CAD (coronary artery disease), native coronary artery 08/30/2018  . Essential hypertension   . Mixed hyperlipidemia   . Angina pectoris ([place]) 02/03/2015  . Post PTCA 02/03/2015  . Diabetes mellitus ([place]) 02/03/2015  . Sick sinus syndrome ([place])   . Pacemaker Medtronic dual chamber adapta ADDR01 on 11/20/2009 in [place]. 11/20/2009  . 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 CARDIAC CATHETERIZATION N/A 02/04/2015   Procedure: Left Heart Cath and Cors/Grafts Angiography;  Surgeon: Adrian Prows, MD;  Location: [place] CV LAB;  Service: Cardiovascular;  Laterality: N/A;  . CARDIAC CATHETERIZATION N/A 02/04/2015   Procedure: Coronary Stent Intervention;  Surgeon: Adrian Prows, MD;  Location: [place] CV LAB;  Service: Cardiovascular;  Laterality: N/A;  . CORONARY ANGIOGRAPHY N/A 10/18/2017   Procedure: CORONARY ANGIOGRAPHY;  Surgeon: Adrian Prows, MD;  Location: [place] CV LAB;  Service: Cardiovascular;  Laterality: N/A;  . CORONARY ANGIOPLASTY    . CORONARY ANGIOPLASTY WITH STENT PLACEMENT     "i've got 5-6 stents total" (08/30/2017)  . CORONARY ARTERY BYPASS GRAFT  2007   CABG X3  . CORONARY ATHERECTOMY N/A 10/18/2017   Procedure: CORONARY ATHERECTOMY;  Surgeon: Adrian Prows, MD;  Location: [place] CV LAB;  Service: Cardiovascular;  Laterality: N/A;  . CORONARY ATHERECTOMY N/A 11/29/2017   Procedure: CORONARY  ATHERECTOMY;  Surgeon: Adrian Prows, MD;  Location: [place] CV LAB;  Service: Cardiovascular;  Laterality: N/A;  . CORONARY CTO INTERVENTION N/A 08/30/2017   Procedure: CORONARY CTO INTERVENTION;  Surgeon: Adrian Prows, MD;  Location: [place] CV LAB;  Service: Cardiovascular;  Laterality: N/A;  . CORONARY STENT INTERVENTION N/A 11/29/2017   Procedure: CORONARY STENT INTERVENTION;  Surgeon: Adrian Prows, MD;  Location: [place] CV LAB;  Service:  Cardiovascular;  Laterality: N/A;  . INSERT / REPLACE / REMOVE PACEMAKER  ?2012   "I think it was placed in 2012"  . LEFT HEART CATH AND CORONARY ANGIOGRAPHY N/A 08/30/2017   Procedure: LEFT HEART CATH AND CORONARY ANGIOGRAPHY;  Surgeon: Adrian Prows, MD;  Location: [place] CV LAB;  Service: Cardiovascular;  Laterality: N/A;  . LEFT HEART CATH AND CORS/GRAFTS ANGIOGRAPHY N/A 08/05/2017   Procedure: LEFT HEART CATH AND CORS/GRAFTS ANGIOGRAPHY;  Surgeon: Adrian Prows, MD;  Location: [place] CV LAB;  Service: Cardiovascular;  Laterality: N/A;       Family History  Problem Relation Age of Onset  . Lung cancer Mother   . Stroke Father     Social History   Tobacco Use  . Smoking status: Former Smoker    Years: 3.00    Types: Cigarettes    Quit date: 1960    Years since quitting: 61.5  . Smokeless tobacco: Never Used  Vaping Use  . Vaping Use: Never used  Substance Use Topics  . Alcohol use: No  . Drug use: No    Home Medications Prior to Admission medications   Medication Sig Start Date End Date Taking? Authorizing Provider  Biotin 5000 MCG TABS Take 5,000 mcg by mouth daily.   Yes [provider]  dorzolamide-timolol (COSOPT) 22.3-6.8 MG/ML ophthalmic solution Place 1 drop into both eyes 2 (two) times daily. 05/22/17  Yes [provider]  glimepiride (AMARYL) 2 MG tablet Take 2 mg by mouth 2 (two) times daily.   Yes [provider]  latanoprost (XALATAN) 0.005 % ophthalmic solution Place 1 drop into both eyes at bedtime. 06/04/17  Yes [provider]  losartan (COZAAR) 25 MG tablet TAKE 1 TABLET BY MOUTH DAILY 11/02/19  Yes Adrian Prows, MD  metFORMIN (GLUCOPHAGE-XR) 500 MG 24 hr tablet Take 1 tablet (500 mg total) by mouth daily. 08/07/17  Yes Adrian Prows, MD  metoprolol tartrate (LOPRESSOR) 25 MG tablet TAKE 1 TABLET BY MOUTH TWICE DAILY 02/05/19  Yes Adrian Prows, MD  nitroGLYCERIN (NITROSTAT) 0.4 MG SL tablet ONE TABLET UNDER TONGUE AS NEEDED FOR  CHEST PAIN Patient taking differently: Place 0.4 mg under the tongue every 5 (five) minutes as needed for chest pain.  09/05/18  Yes Patwardhan, Manish J, MD  simvastatin (ZOCOR) 40 MG tablet Take 40 mg by mouth daily.   Yes [provider]  vitamin B-12 (CYANOCOBALAMIN) 1000 MCG tablet Take 1,000 mcg by mouth daily.   Yes [provider]  XARELTO 20 MG TABS tablet TAKE 1 TABLET(20 MG) BY MOUTH DAILY WITH SUPPER Patient taking differently: Take 20 mg by mouth daily.  12/13/19  Yes Adrian Prows, MD  TRUE METRIX BLOOD GLUCOSE TEST test strip TEST 3 TIMES D 07/10/18   [provider]    Allergies    Brilinta [ticagrelor]  Review of Systems   Review of Systems  Constitutional: Negative for chills and fever.  HENT: Negative for congestion and facial swelling.   Eyes: Negative for discharge and visual disturbance.  Respiratory: Negative for  shortness of breath.   Cardiovascular: Positive for chest pain. Negative for palpitations.  Gastrointestinal: Negative for abdominal pain, diarrhea and vomiting.  Musculoskeletal: Negative for arthralgias and myalgias.  Skin: Negative for color change and rash.  Neurological: Negative for tremors, syncope and headaches.  Psychiatric/Behavioral: Negative for confusion and dysphoric mood.    Physical Exam Updated Vital Signs BP 98/64   Pulse 67   Temp (!) 97.5 F (36.4 C) (Oral)   Resp 20   Ht 6\' 2"  (1.88 m)   Wt 89.8 kg   SpO2 98%   BMI 25.42 kg/m   Physical Exam Vitals and nursing note reviewed.  Constitutional:      Appearance: He is well-developed.  HENT:     Head: Normocephalic and atraumatic.  Eyes:     Pupils: Pupils are equal, round, and reactive to light.  Neck:     Vascular: No JVD.  Cardiovascular:     Rate and Rhythm: Normal rate and regular rhythm.     Heart sounds: No murmur heard.  No friction rub. No gallop.   Pulmonary:     Effort: No respiratory distress.     Breath sounds: No wheezing.    Chest:     Chest wall: No tenderness.  Abdominal:     General: There is no distension.     Tenderness: There is no guarding or rebound.  Musculoskeletal:        General: Normal range of motion.     Cervical back: Normal range of motion and neck supple.  Skin:    Coloration: Skin is not pale.     Findings: No rash.  Neurological:     Mental Status: He is alert and oriented to person, place, and time.  Psychiatric:        Behavior: Behavior normal.     ED Results / Procedures / Treatments   Labs (all labs ordered are listed, but only abnormal results are displayed) Labs Reviewed  BASIC METABOLIC PANEL - Abnormal; Notable for the following components:      Result Value   CO2 21 (*)    Glucose, Bld 150 (*)    BUN 25 (*)    All other components within normal limits  CBC  TROPONIN I (HIGH SENSITIVITY)  TROPONIN I (HIGH SENSITIVITY)    EKG EKG Interpretation  Date/Time:  Friday January 18 2020 08:37:08 EDT Ventricular Rate:  72 PR Interval:  144 QRS Duration: 178 QT Interval:  482 QTC Calculation: 527 R Axis:   -78 Text Interpretation: Atrial-sensed ventricular-paced rhythm with occasional Premature ventricular complexes Abnormal ECG Otherwise no significant change Confirmed by Deno Etienne [phone]) on 01/18/2020 9:05:05 AM   Radiology DG Chest 2 View  Result Date: 01/18/2020 CLINICAL DATA:  Right chest pain EXAM: CHEST - 2 VIEW COMPARISON:  09/02/2018 chest radiograph. FINDINGS: Intact sternotomy wires. Stable configuration of 2 lead left subclavian pacemaker. Stable cardiomediastinal silhouette with normal heart size. No pneumothorax. No pleural effusion. Lungs appear clear, with no acute consolidative airspace disease and no pulmonary edema. IMPRESSION: No active cardiopulmonary disease. Electronically Signed   By: Ilona Sorrel M.D.   On: 01/18/2020 08:57    Procedures Procedures (including critical care time)  Medications Ordered in ED Medications - No data to  display  ED Course  I have reviewed the triage vital signs and the nursing notes.  Pertinent labs & imaging results that were available during my care of the patient were reviewed by me and considered in my medical  decision making (see chart for details).    MDM Rules/Calculators/A&P                          79 yo M with a chief complaints of chest pain.  Feels like when he had an MI in the past but less severe.  No shortness of breath not exertional no nausea or diaphoresis.  EKG paced.  Will obtain a laboratory evaluation chest x-ray viewed by me without focal infiltrate or pneumothorax.  Delta troponin is negative.  No significant anemia.  I discussed the case with Dr. Terri Skains, cardiology he reviewed the patient's case and independently reviewed the patient's EKG and he felt he was reasonable to have him follow-up as an outpatient.  Their office will contact him for an appointment.  Patient continues to be pain-free on reassessment and understands the plan.  1:13 PM:  I have discussed the diagnosis/risks/treatment options with the patient and believe the pt to be eligible for discharge home to follow-up with Cards. We also discussed returning to the ED immediately if new or worsening sx occur. We discussed the sx which are most concerning (e.g., sudden worsening pain, fever, inability to tolerate by mouth) that necessitate immediate return. Medications administered to the patient during their visit and any new prescriptions provided to the patient are listed below.  Medications given during this visit Medications - No data to display   The patient appears reasonably screen and/or stabilized for discharge and I doubt any other medical condition or other [facility] requiring further screening, evaluation, or treatment in the ED at this time prior to discharge.   Final Clinical Impression(s) / ED Diagnoses Final diagnoses:  Nonspecific chest pain    Rx / DC Orders ED Discharge Orders    None        Deno Etienne, DO 01/18/20 1313

## 2020-01-18 NOTE — ED Notes (Signed)
Patient verbalizes understanding of discharge instructions. Opportunity for questioning and answers were provided. Pt discharged from ED. 

## 2020-01-18 NOTE — ED Triage Notes (Signed)
Pt reports left sided chest pain for the past 2 days, started under his left arm and no radiates to the center, describes as a pressure, took 1 nitro with some relief. Pt denies sob, nausea or any other symptoms.

## 2020-01-24 ENCOUNTER — Ambulatory Visit: Payer: Medicare Other | Admitting: Cardiology

## 2020-01-24 ENCOUNTER — Encounter: Payer: Self-pay | Admitting: Cardiology

## 2020-01-24 ENCOUNTER — Other Ambulatory Visit: Payer: Self-pay

## 2020-01-24 VITALS — BP 147/72 | HR 75 | Resp 15 | Ht 74.0 in | Wt 198.2 lb

## 2020-01-24 DIAGNOSIS — I495 Sick sinus syndrome: Secondary | ICD-10-CM

## 2020-01-24 DIAGNOSIS — R0609 Other forms of dyspnea: Secondary | ICD-10-CM

## 2020-01-24 DIAGNOSIS — I209 Angina pectoris, unspecified: Secondary | ICD-10-CM | POA: Diagnosis not present

## 2020-01-24 DIAGNOSIS — Z950 Presence of cardiac pacemaker: Secondary | ICD-10-CM | POA: Diagnosis not present

## 2020-01-24 DIAGNOSIS — I480 Paroxysmal atrial fibrillation: Secondary | ICD-10-CM

## 2020-01-24 DIAGNOSIS — I25118 Atherosclerotic heart disease of native coronary artery with other forms of angina pectoris: Secondary | ICD-10-CM

## 2020-01-24 DIAGNOSIS — E7800 Pure hypercholesterolemia, unspecified: Secondary | ICD-10-CM

## 2020-01-24 MED ORDER — EZETIMIBE 10 MG PO TABS: 10.0000 mg | ORAL_TABLET | Freq: Every day | ORAL | 2 refills | Status: DC

## 2020-01-24 MED ORDER — NITROGLYCERIN 0.4 MG SL SUBL: 0.4000 mg | SUBLINGUAL_TABLET | SUBLINGUAL | 3 refills | Status: AC | PRN

## 2020-01-24 NOTE — Progress Notes (Signed)
Primary Physician/Referring:  Deland Pretty, MD  Patient ID: Jesse [name], male    DOB: 1940-12-14, 79 y.o.   MRN: [id number]  Chief Complaint  Patient presents with  . Hospitalization Follow-up    1 week    HPI:    Jesse [name]  is a 79 y.o. male  with coronary artery disease and CABG in 2007 in [place] with LIMA to LAD, SVG to OM which is occluded by angiography on 02/04/2015, occluded SVG to RCA. He underwent complex successful arthrectomy and PCI to Cx on 11/29/2017. His past medical history also includes sick sinus syndrome status post Medtronic pacemaker implantation 2011, PAF, diabetes mellitus, hypertension and hyperlipidemia.  He was seen in the emergency room on 01/18/2019 with exertional chest pain with radiation to the back associated with dyspnea on exertion with partial relief with sublingual nitroglycerin.  He was ruled out for myocardial infarction with no EKG abnormalities was discharged home with outpatient follow-up.  Since then he still continues to have occasional episodes of chest tightness and also has noticed clear change in his dyspnea on exertion.  No PND or orthopnea, no leg edema.  He has not used any several nitroglycerin since the emergency room visits.     Past Medical History:  Diagnosis Date  . Angina pectoris ([place]) 02/03/2015  . CAD (coronary artery disease), native coronary artery 08/30/2018   2007 [place], [place]: LIMA to LAD, Occluded SVG to RCA and OM by angiogram S/P  PCI/orbital atherectomy S/P 2.5x15 Resoluute to prox Cx on 11/29/2017.  Marland Kitchen Coronary artery disease   . High cholesterol   . Hypertension   . Myocardial infarction [facility]) 2017/2018?  . NSTEMI (non-ST elevated myocardial infarction) ([place]) 02/03/2015  . Presence of permanent cardiac pacemaker   . Type II diabetes mellitus ([place])    Past Surgical History:  Procedure Laterality Date  . CARDIAC CATHETERIZATION N/A 02/04/2015   Procedure: Left Heart Cath and Cors/Grafts Angiography;  Surgeon:  Adrian Prows, MD;  Location: [place] CV LAB;  Service: Cardiovascular;  Laterality: N/A;  . CARDIAC CATHETERIZATION N/A 02/04/2015   Procedure: Coronary Stent Intervention;  Surgeon: Adrian Prows, MD;  Location: [place] CV LAB;  Service: Cardiovascular;  Laterality: N/A;  . CORONARY ANGIOGRAPHY N/A 10/18/2017   Procedure: CORONARY ANGIOGRAPHY;  Surgeon: Adrian Prows, MD;  Location: [place] CV LAB;  Service: Cardiovascular;  Laterality: N/A;  . CORONARY ANGIOPLASTY    . CORONARY ANGIOPLASTY WITH STENT PLACEMENT     "i've got 5-6 stents total" (08/30/2017)  . CORONARY ARTERY BYPASS GRAFT  2007   CABG X3  . CORONARY ATHERECTOMY N/A 10/18/2017   Procedure: CORONARY ATHERECTOMY;  Surgeon: Adrian Prows, MD;  Location: [place] CV LAB;  Service: Cardiovascular;  Laterality: N/A;  . CORONARY ATHERECTOMY N/A 11/29/2017   Procedure: CORONARY ATHERECTOMY;  Surgeon: Adrian Prows, MD;  Location: [place] CV LAB;  Service: Cardiovascular;  Laterality: N/A;  . CORONARY CTO INTERVENTION N/A 08/30/2017   Procedure: CORONARY CTO INTERVENTION;  Surgeon: Adrian Prows, MD;  Location: [place] CV LAB;  Service: Cardiovascular;  Laterality: N/A;  . CORONARY STENT INTERVENTION N/A 11/29/2017   Procedure: CORONARY STENT INTERVENTION;  Surgeon: Adrian Prows, MD;  Location: [place] CV LAB;  Service: Cardiovascular;  Laterality: N/A;  . INSERT / REPLACE / REMOVE PACEMAKER  ?2012   "I think it was placed in 2012"  . LEFT HEART CATH AND CORONARY ANGIOGRAPHY N/A 08/30/2017   Procedure: LEFT HEART CATH AND CORONARY ANGIOGRAPHY;  Surgeon:  Adrian Prows, MD;  Location: [place] CV LAB;  Service: Cardiovascular;  Laterality: N/A;  . LEFT HEART CATH AND CORS/GRAFTS ANGIOGRAPHY N/A 08/05/2017   Procedure: LEFT HEART CATH AND CORS/GRAFTS ANGIOGRAPHY;  Surgeon: Adrian Prows, MD;  Location: [place] CV LAB;  Service: Cardiovascular;  Laterality: N/A;   Family History  Problem Relation Age of Onset  . Lung cancer Mother   . Stroke  Father   . Febrile seizures Brother     Social History   Tobacco Use  . Smoking status: Former Smoker    Years: 3.00    Types: Cigarettes    Quit date: 1960    Years since quitting: 61.5  . Smokeless tobacco: Never Used  Substance Use Topics  . Alcohol use: No   Marital Status: Widowed ROS  Review of Systems  Cardiovascular: Negative for chest pain, dyspnea on exertion and leg swelling.  Gastrointestinal: Negative for melena.   Objective  Blood pressure (!) 147/72, pulse 75, resp. rate 15, height _0  (1.88 m), weight 198 lb 3.2 oz (89.9 kg), SpO2 98 %.  Vitals with BMI 01/24/2020 01/18/2020 01/18/2020  Height _1  - -  Weight 198 lbs 3 oz - -  BMI 86.38 - -  Systolic 177 98 97  Diastolic 72 64 58  Pulse 75 67 64     Physical Exam Constitutional:      Appearance: He is well-developed.  Cardiovascular:     Rate and Rhythm: Normal rate and regular rhythm.     Pulses: Intact distal pulses.     Heart sounds: Normal heart sounds. No murmur heard.  No gallop.      Comments: No leg edema, no JVD. Pulmonary:     Effort: Pulmonary effort is normal.     Breath sounds: Normal breath sounds.  Abdominal:     General: Bowel sounds are normal.     Palpations: Abdomen is soft.    Laboratory examination:   External labs:  Labs 11/28/2019:  Hb 15.1/HCT 43.7, platelets 156.  Sodium 139, potassium 4.1, BUN 22, creatinine 1.1, EGFR 68 mL. CMP normal.  Total cholesterol 149, triglycerides 198, HDL 34, LDL 75, non-HDL cholesterol 115.  MicroAlbumin/Creat 11/28/2019  11/23/2018:  PSA normal.  Hb 14.3/HCT 41.8, platelets 223.  Serum glucose 127 mg, BUN 18, creatinine 1.1, EGFR 68 mL, potassium 4.6. CMP otherwise normal.   A1c 6.6%. Total cholesterol 112, triglycerides 126, HDL 29, LDL 58.  Non-HDL cholesterol 83.  TSH 1.890 UIU/ 01/31/2017  Medications and allergies   Allergies  Allergen Reactions  . Brilinta [Ticagrelor]     Difficulty breathing      Current Outpatient  Medications  Medication Instructions  . Biotin 5,000 mcg, Oral, Daily  . dorzolamide-timolol (COSOPT) 22.3-6.8 MG/ML ophthalmic solution 1 drop, Both Eyes, 2 times daily  . ezetimibe (ZETIA) 10 mg, Oral, Daily after supper  . glimepiride (AMARYL) 2 mg, Oral, 2 times daily  . latanoprost (XALATAN) 0.005 % ophthalmic solution 1 drop, Both Eyes, Daily at bedtime  . losartan (COZAAR) 25 MG tablet TAKE 1 TABLET BY MOUTH DAILY  . melatonin 5 mg, Oral  . metFORMIN (GLUCOPHAGE-XR) 500 mg, Oral, Daily  . metoprolol tartrate (LOPRESSOR) 25 MG tablet TAKE 1 TABLET BY MOUTH TWICE DAILY  . nitroGLYCERIN (NITROSTAT) 0.4 MG SL tablet ONE TABLET UNDER TONGUE AS NEEDED FOR CHEST PAIN  . simvastatin (ZOCOR) 40 mg, Oral, Daily  . TRUE METRIX BLOOD GLUCOSE TEST test strip TEST 3 TIMES D  . vitamin  B-12 (CYANOCOBALAMIN) 1,000 mcg, Oral, Daily  . XARELTO 20 MG TABS tablet TAKE 1 TABLET(20 MG) BY MOUTH DAILY WITH SUPPER    Radiology:   No results found.  Cardiac Studies:   Nuclear stress test 02/14/2017: 1. The resting electrocardiogram demonstrated normal sinus rhythm and incomplete RBBB. Inferior and posterior infarct, old. Stress EKG is non-diagnostic for ischemia as it a pharmacologic stress using Lexiscan. Stress symptoms included dyspnea. Patient unable to complete stress test due to fatigue and switched to [place]. 2. Myocardial perfusion imaging is abnormal. There is a moderate area of infarction in the basal inferior, mid inferior and apical inferior myocardial wall(s). Gated SPECT imaging demonstrates akinesis of the basal inferior, mid inferior and apical inferior myocardial wall(s). The left ventricular ejection fraction was calculated or visually estimated to be 38%. This is an intermediate risk study, clinical correlation recommended.  Echocardiogram 08/24/2017: Left ventricle cavity is normal in size. Moderate concentric hypertrophy of the left ventricle. Visual EF is 45-50%. Abnormal septal  wall motion due to post-operative coronary artery bypass graft. Doppler evidence of grade I (impaired) diastolic dysfunction, normal LAP. Mild (Grade I) aortic regurgitation. Mild (Grade I) mitral regurgitation. Inadequate tricuspid regurgitation jet to estimate pulmonary artery pressure. Normal right atrial pressure. Compared to prior study dated 58/52/7782, LV systolic function is mildly reduced.  Coronary Angiogram05/01/2018: ([place]): LIMA to LAD, Occluded SVG to RCA and OM by angiogram S/P PCI/orbital atherectomy S/P 2.5x15 Resoluute to prox Cx on 11/29/2017.  Scheduled Remote Pacemaker transmission 09/04/2019:  There were 34 automatic mode switch episodes detected since 08/31/19. The longest lasted 00:02:2:08 in duration. Review of available EGM demonstrates paroxysmal atrial fibrillation. There was a <0.1 % cumulative atrial arrhythmia burden. There were 0 high ventricular rate episodes detected since 08/31/19. Battery longevity is 27 months. RA pacing is 48.1 %, RV pacing is 0.1 %.  Scheduled  In office pacemaker check 12/19/19  Single (NSR @ 69/min. Pacemaker dependant:  No. Underlying NSR. AP 49%, VP 1%. AMS Episodes 26.  AT/AF burden <0.1% . Longest 2 hours. Latest 09/06/2019. HVR 0.  Longevity 2.5 Years. Magnet rate: >85%. Lead measurements: Stable. Histogram: Low (L)/normal (N)/high (H)  Normal. Patient activity Good.   Observations: Normal pacemaker function. Changes: None.  EKG   09/20/2019: Atrially paced rhythm at rate of 78 bpm, left axis deviation, left anterior fascicular block.  Right bundle branch block.  No evidence of ischemia.   No significant change from  EKG 03/19/2019   Assessment     ICD-10-CM   1. Coronary artery disease of native artery of native heart with stable angina pectoris ([place])  I25.118 PCV MYOCARDIAL PERFUSION WO LEXISCAN  2. PAF (paroxysmal atrial fibrillation) ([place]). CHA2DS2-VASc Score is 5 (A, HTN, DM Vasc Dz). Yearly risk of  stroke 6.7%  I48.0   3. Sick sinus syndrome ([facility])  I49.5   4. Pacemaker Medtronic dual chamber adapta ADDR01 on 11/20/2009 in [place].  Z95.0   5. Essential hypertension  I10   6. Hypercholesteremia  E78.00 ezetimibe (ZETIA) 10 MG tablet  7. Angina pectoris ([place])  I20.9 PCV MYOCARDIAL PERFUSION WO LEXISCAN  8. Dyspnea on exertion  R06.00 PCV MYOCARDIAL PERFUSION WO LEXISCAN     Meds ordered this encounter  Medications  . ezetimibe (ZETIA) 10 MG tablet    Sig: Take 1 tablet (10 mg total) by mouth daily after supper.    Dispense:  30 tablet    Refill:  2    There are no discontinued medications.  Recommendations:   Jesse [name]  is a 79 y.o. male  with coronary artery disease and CABG in 2007 in [place] with LIMA to LAD, SVG to OM which is occluded by angiography on 02/04/2015, occluded SVG to RCA. He underwent complex successful arthrectomy and PCI to Cx on 11/29/2017. His past medical history also includes sick sinus syndrome status post Medtronic pacemaker implantation 2011, PAF, diabetes mellitus, hypertension and hyperlipidemia.  He presented on 01/18/2020 with chest pain with radiation to his jaw, worsening dyspnea on exertion.  He has continued to have exertional chest discomfort but not as severe and has gradually improved.  I will set him up for a nuclear stress test to exclude progression of disease.  I will also schedule him for an echocardiogram (it was also previously ordered but not done, will schedule).  With regard to lipids, LDL although controlled was at 75 recent outpatient labs performed by PCP, I will add Zetia to be aggressive with LDL reduction to closer to 50.  Blood pressure is well controlled.  Pacemaker is functioning normally.  I will see him back after the stress test unless he has recurrence of frequent episodes of chest pain, he is advised to go to the emergency room.  He does have sublingual nitroglycerin that are new with him.   Adrian Prows, MD, [facility] 01/27/2020,  4:23 PM Office: [phone]

## 2020-01-24 NOTE — Patient Instructions (Signed)
Hold Metoprolol evening before and day of the stress test.

## 2020-02-06 ENCOUNTER — Other Ambulatory Visit: Payer: Self-pay

## 2020-02-06 ENCOUNTER — Ambulatory Visit: Payer: Medicare Other

## 2020-02-06 ENCOUNTER — Ambulatory Visit: Payer: Medicare HMO

## 2020-02-06 DIAGNOSIS — Z951 Presence of aortocoronary bypass graft: Secondary | ICD-10-CM | POA: Diagnosis not present

## 2020-02-06 DIAGNOSIS — I25118 Atherosclerotic heart disease of native coronary artery with other forms of angina pectoris: Secondary | ICD-10-CM

## 2020-02-11 ENCOUNTER — Other Ambulatory Visit: Payer: PRIVATE HEALTH INSURANCE

## 2020-02-12 NOTE — Progress Notes (Signed)
Called patient, he is not having any any chest pains or symptoms and will call if he does have any. He said he is fine for now.

## 2020-02-22 ENCOUNTER — Other Ambulatory Visit: Payer: Self-pay | Admitting: Cardiology

## 2020-03-12 DIAGNOSIS — I480 Paroxysmal atrial fibrillation: Secondary | ICD-10-CM | POA: Diagnosis not present

## 2020-03-12 DIAGNOSIS — E118 Type 2 diabetes mellitus with unspecified complications: Secondary | ICD-10-CM | POA: Diagnosis not present

## 2020-03-12 DIAGNOSIS — E782 Mixed hyperlipidemia: Secondary | ICD-10-CM | POA: Diagnosis not present

## 2020-03-12 DIAGNOSIS — I10 Essential (primary) hypertension: Secondary | ICD-10-CM | POA: Diagnosis not present

## 2020-03-19 ENCOUNTER — Ambulatory Visit: Payer: PRIVATE HEALTH INSURANCE | Admitting: Cardiology

## 2020-03-21 ENCOUNTER — Encounter: Payer: Self-pay | Admitting: Cardiology

## 2020-03-21 ENCOUNTER — Ambulatory Visit: Payer: Medicare Other | Admitting: Cardiology

## 2020-03-21 ENCOUNTER — Other Ambulatory Visit: Payer: Self-pay

## 2020-03-21 VITALS — BP 112/57 | HR 68 | Resp 17 | Ht 74.0 in | Wt 196.0 lb

## 2020-03-21 DIAGNOSIS — I25118 Atherosclerotic heart disease of native coronary artery with other forms of angina pectoris: Secondary | ICD-10-CM

## 2020-03-21 DIAGNOSIS — M25462 Effusion, left knee: Secondary | ICD-10-CM

## 2020-03-21 DIAGNOSIS — I480 Paroxysmal atrial fibrillation: Secondary | ICD-10-CM

## 2020-03-21 MED ORDER — TRAMADOL HCL 50 MG PO TABS: 50.0000 mg | ORAL_TABLET | Freq: Four times a day (QID) | ORAL | 0 refills | Status: DC | PRN

## 2020-03-21 NOTE — Progress Notes (Signed)
Primary Physician/Referring:  Deland Pretty, MD  Patient ID: Jesse [name], male    DOB: 02-09-1941, 79 y.o.   MRN: [id number]  Chief Complaint  Patient presents with  . Leg Swelling    knee  . Coronary Artery Disease   HPI:    Jesse [name]  is a 79 y.o. male  with coronary artery disease and CABG in 2007 in [place] with LIMA to LAD, SVG to OM which is occluded by angiography on 02/04/2015, occluded SVG to RCA. He underwent complex successful arthrectomy and PCI to Cx on 11/29/2017. His past medical history also includes sick sinus syndrome status post Medtronic pacemaker implantation 2011, PAF, diabetes mellitus, hypertension and hyperlipidemia.  He was seen in the emergency room on 01/18/2019 with exertional chest pain with radiation to the back associated with dyspnea on exertion with partial relief with sublingual nitroglycerin.  He was ruled out for myocardial infarction with no EKG abnormalities was discharged home. He underwent nuclear stress test and echocardiogram on 02/06/2020, had inferior ischemia and normal LV systolic function, EF had improved from 45% to 55% by echo compared to 2019.  No change in the ischemic burden in known occluded right coronary artery.  I am seeing him as a sick work in, although he has an appointment to see me next week as his wife was seeing me today for routine visit and mentioned that he has severe swelling in his left leg and is in acute pain.  I was concerned about DVT although he is on anticoagulation and brought him in to be seen.  Past Medical History:  Diagnosis Date  . Angina pectoris ([place]) 02/03/2015  . CAD (coronary artery disease), native coronary artery 08/30/2018   2007 [place], [place]: LIMA to LAD, Occluded SVG to RCA and OM by angiogram S/P  PCI/orbital atherectomy S/P 2.5x15 Resoluute to prox Cx on 11/29/2017.  Marland Kitchen Coronary artery disease   . High cholesterol   . Hypertension   . Myocardial infarction [facility]) 2017/2018?  . NSTEMI (non-ST  elevated myocardial infarction) ([place]) 02/03/2015  . Presence of permanent cardiac pacemaker   . Type II diabetes mellitus ([place])    Past Surgical History:  Procedure Laterality Date  . CARDIAC CATHETERIZATION N/A 02/04/2015   Procedure: Left Heart Cath and Cors/Grafts Angiography;  Surgeon: Adrian Prows, MD;  Location: [place] [facility];  Service: Cardiovascular;  Laterality: N/A;  . CARDIAC CATHETERIZATION N/A 02/04/2015   Procedure: Coronary Stent Intervention;  Surgeon: Adrian Prows, MD;  Location: [place] [facility];  Service: Cardiovascular;  Laterality: N/A;  . CORONARY ANGIOGRAPHY N/A 10/18/2017   Procedure: CORONARY ANGIOGRAPHY;  Surgeon: Adrian Prows, MD;  Location: [place] [facility];  Service: Cardiovascular;  Laterality: N/A;  . CORONARY ANGIOPLASTY    . CORONARY ANGIOPLASTY WITH STENT PLACEMENT     "i've got 5-6 stents total" (08/30/2017)  . CORONARY ARTERY BYPASS GRAFT  2007   CABG X3  . CORONARY ATHERECTOMY N/A 10/18/2017   Procedure: CORONARY ATHERECTOMY;  Surgeon: Adrian Prows, MD;  Location: [place] [facility];  Service: Cardiovascular;  Laterality: N/A;  . CORONARY ATHERECTOMY N/A 11/29/2017   Procedure: CORONARY ATHERECTOMY;  Surgeon: Adrian Prows, MD;  Location: [place] [facility];  Service: Cardiovascular;  Laterality: N/A;  . CORONARY CTO INTERVENTION N/A 08/30/2017   Procedure: CORONARY CTO INTERVENTION;  Surgeon: Adrian Prows, MD;  Location: [place] [facility];  Service: Cardiovascular;  Laterality: N/A;  . CORONARY STENT INTERVENTION N/A 11/29/2017   Procedure: CORONARY STENT  INTERVENTION;  Surgeon: Adrian Prows, MD;  Location: [place] [facility];  Service: Cardiovascular;  Laterality: N/A;  . INSERT / REPLACE / REMOVE PACEMAKER  ?2012   "I think it was placed in 2012"  . LEFT HEART CATH AND CORONARY ANGIOGRAPHY N/A 08/30/2017   Procedure: LEFT HEART CATH AND CORONARY ANGIOGRAPHY;  Surgeon: Adrian Prows, MD;  Location: [place] [facility];  Service: Cardiovascular;  Laterality: N/A;  . LEFT  HEART CATH AND CORS/GRAFTS ANGIOGRAPHY N/A 08/05/2017   Procedure: LEFT HEART CATH AND CORS/GRAFTS ANGIOGRAPHY;  Surgeon: Adrian Prows, MD;  Location: [place] [facility];  Service: Cardiovascular;  Laterality: N/A;   Family History  Problem Relation Age of Onset  . Lung cancer Mother   . Stroke Father   . Febrile seizures Brother     Social History   Tobacco Use  . Smoking status: Former Smoker    Years: 3.00    Types: Cigarettes    Quit date: 1960    Years since quitting: 61.7  . Smokeless tobacco: Never Used  Substance Use Topics  . Alcohol use: No   Marital Status: Widowed ROS  Review of Systems  Cardiovascular: Negative for chest pain, dyspnea on exertion and leg swelling.  Musculoskeletal: Positive for arthritis and joint swelling (left knee).  Gastrointestinal: Negative for melena.   Objective  Blood pressure (!) 112/57, pulse 68, resp. rate 17, height _0  (1.88 m), weight 196 lb (88.9 kg), SpO2 96 %.  Vitals with BMI 03/21/2020 01/24/2020 01/18/2020  Height _1  _2  -  Weight 196 lbs 198 lbs 3 oz -  BMI 13.24 40.10 -  Systolic 272 536 98  Diastolic 57 72 64  Pulse 68 75 67     Physical Exam Constitutional:      Appearance: He is well-developed.  Cardiovascular:     Rate and Rhythm: Normal rate and regular rhythm.     Pulses: Intact distal pulses.     Heart sounds: Normal heart sounds. No murmur heard.  No gallop.      Comments: No leg edema, no JVD. Pulmonary:     Effort: Pulmonary effort is normal.     Breath sounds: Normal breath sounds.  Abdominal:     General: Bowel sounds are normal.     Palpations: Abdomen is soft.  Musculoskeletal:        General: Swelling (left knee warm and swollen. No signs of DVT) present.    Laboratory examination:   External labs:  Labs 11/28/2019:  Hb 15.1/HCT 43.7, platelets 156.  Sodium 139, potassium 4.1, BUN 22, creatinine 1.1, EGFR 68 mL. CMP normal.  Total cholesterol 149, triglycerides 198, HDL 34, LDL 75,  non-HDL cholesterol 115.  MicroAlbumin/Creat 11/28/2019  11/23/2018:  PSA normal.  Hb 14.3/HCT 41.8, platelets 223.  Serum glucose 127 mg, BUN 18, creatinine 1.1, EGFR 68 mL, potassium 4.6. CMP otherwise normal.   A1c 6.6%. Total cholesterol 112, triglycerides 126, HDL 29, LDL 58.  Non-HDL cholesterol 83.  TSH 1.890 UIU/ 01/31/2017  Medications and allergies   Allergies  Allergen Reactions  . Brilinta [Ticagrelor]     Difficulty breathing      Current Outpatient Medications  Medication Instructions  . Biotin 5,000 mcg, Oral, Daily  . dorzolamide-timolol (COSOPT) 22.3-6.8 MG/ML ophthalmic solution 1 drop, Both Eyes, 2 times daily  . ezetimibe (ZETIA) 10 mg, Oral, Daily after supper  . glimepiride (AMARYL) 2 mg, Oral, 2 times daily  . latanoprost (XALATAN) 0.005 % ophthalmic solution 1  drop, Both Eyes, Daily at bedtime  . losartan (COZAAR) 25 MG tablet TAKE 1 TABLET BY MOUTH DAILY  . melatonin 5 mg, Oral  . metFORMIN (GLUCOPHAGE-XR) 500 mg, Oral, Daily  . metoprolol tartrate (LOPRESSOR) 25 MG tablet TAKE 1 TABLET BY MOUTH TWICE DAILY  . nitroGLYCERIN (NITROSTAT) 0.4 mg, Sublingual, Every 5 min PRN  . simvastatin (ZOCOR) 40 mg, Oral, Daily  . traMADol (ULTRAM) 50 mg, Oral, Every 6 hours PRN  . TRUE METRIX BLOOD GLUCOSE TEST test strip TEST 3 TIMES D  . vitamin B-12 (CYANOCOBALAMIN) 1,000 mcg, Oral, Daily  . XARELTO 20 MG TABS tablet TAKE 1 TABLET(20 MG) BY MOUTH DAILY WITH SUPPER    Radiology:   No results found.  Cardiac Studies:   Coronary Angiogram05/01/2018: ([place]): LIMA to LAD, Occluded SVG to RCA and OM by angiogram S/P PCI/orbital atherectomy S/P 2.5x15 Resoluute to prox Cx on 11/29/2017.  Lexiscan/modified Bruce Tetrofosmin stress test 02/06/2020: Lexiscan/modified Bruce nuclear stress test performed using 1-day protocol. Stress EKG is non-diagnostic, as this is pharmacological stress test. In addition, stress EKG at 110% MPHR showed AV paced  rhythm.  SPECT images show medium sized, mild intensity, partially reversible perfusion defect in apical to basal inferior and basal inferoseptal myocardium. While septal perfusion defect may be due to paced rhythm, ischemia cannot be excluded. Recommend clinical correlation.  Stress LVEF 44%. High risk study. No significant change from 02/14/2017.   Echocardiogram 02/06/2020:  Normal LV systolic function with visual EF 55-60%. Left ventricle cavity  is normal in size. Mild left ventricular hypertrophy. Normal global wall  motion. Indeterminate diastolic filling pattern, indeterminate LAP. Calculated EF 55%.  Left atrial cavity is normal in size. A lipomatous septum is present.  Mild to moderate aortic regurgitation.  Moderate tricuspid regurgitation.  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   Scheduled Remote Pacemaker transmission 09/04/2019:  There were 34 automatic mode switch episodes detected since 08/31/19. The longest lasted 00:02:2:08 in duration. Review of available EGM demonstrates paroxysmal atrial fibrillation. There was a <0.1 % cumulative atrial arrhythmia burden. There were 0 high ventricular rate episodes detected since 08/31/19. Battery longevity is 27 months. RA pacing is 48.1 %, RV pacing is 0.1 %.  Scheduled  In office pacemaker check 12/19/19  Single (NSR @ 69/min. Pacemaker dependant:  No. Underlying NSR. AP 49%, VP 1%. AMS Episodes 26.  AT/AF burden <0.1% . Longest 2 hours. Latest 09/06/2019. HVR 0.  Longevity 2.5 Years. Magnet rate: >85%. Lead measurements: Stable. Histogram: Low (L)/normal (N)/high (H)  Normal. Patient activity Good.   Observations: Normal pacemaker function. Changes: None.  EKG   09/20/2019: Atrially paced rhythm at rate of 78 bpm, left axis deviation, left anterior fascicular block.  Right bundle branch block.   No evidence of ischemia.   No significant change from  EKG 03/19/2019   Assessment     ICD-10-CM   1. Effusion of bursa of left knee  M25.462 traMADol (ULTRAM) 50 MG tablet  2. Coronary artery disease of native artery of native heart with stable angina pectoris ([place])  I25.118   3. PAF (paroxysmal atrial fibrillation) ([place]). CHA2DS2-VASc Score is 5 (A, HTN, DM Vasc Dz). Yearly risk of stroke 6.7%  I48.0      Meds ordered this encounter  Medications  . traMADol (ULTRAM) 50 MG tablet    Sig: Take  1 tablet (50 mg total) by mouth every 6 (six) hours as needed.    Dispense:  30 tablet    Refill:  0    There are no discontinued medications.   Recommendations:   Roel Douthat  is a 79 y.o. male  with coronary artery disease and CABG in 2007 in [place] with LIMA to LAD, SVG to OM which is occluded by angiography on 02/04/2015, occluded SVG to RCA. He underwent complex successful arthrectomy and PCI to Cx on 11/29/2017. His past medical history also includes sick sinus syndrome status post Medtronic pacemaker implantation 2011, PAF, diabetes mellitus, hypertension and hyperlipidemia.  He presented on 01/18/2020 with chest pain with radiation to his jaw, worsening dyspnea on exertion.  I reviewed the results of the stress test briefly as I am seeing him as a sick work in as I was seeing his wife and she mentioned that he has severe swelling and severe pain in his left leg and has not been able to walk.  I brought him in to exclude DVT although he is on anticoagulation for atrial fibrillation.  On review, he clearly has either an effusion in the left knee or Baker's cyst and I will contact my orthopedic colleagues to see him today as he does not have a regular orthopedic physician.  I will try to get him in to be seen today urgently as he is in severe pain.  I did not make any changes to his medication.  Advised him to keep his appointment with me next week to again discussed in detail regarding his  echocardiogram and stress report.  Fortunately patient has not had any recurrence of angina pectoris except for occasional chest pain and he has not used any sublingual nitroglycerin since last office visit. He will be seeing Dr. Elesa Massed today at 2:30 pm.    Adrian Prows, MD, [facility] 03/23/2020, 3:06 PM Office: [phone]

## 2020-03-23 ENCOUNTER — Encounter: Payer: Self-pay | Admitting: Cardiology

## 2020-03-25 ENCOUNTER — Emergency Department (HOSPITAL_COMMUNITY): Payer: Medicare HMO

## 2020-03-25 ENCOUNTER — Emergency Department (HOSPITAL_COMMUNITY)
Admission: EM | Admit: 2020-03-25 | Discharge: 2020-03-25 | Disposition: A | Discharge status: Home / Self Care | Payer: Medicare HMO | Attending: Emergency Medicine | Admitting: Emergency Medicine

## 2020-03-25 DIAGNOSIS — I214 Non-ST elevation (NSTEMI) myocardial infarction: Secondary | ICD-10-CM | POA: Insufficient documentation

## 2020-03-25 DIAGNOSIS — S8992XA Unspecified injury of left lower leg, initial encounter: Secondary | ICD-10-CM | POA: Diagnosis not present

## 2020-03-25 DIAGNOSIS — Z7984 Long term (current) use of oral hypoglycemic drugs: Secondary | ICD-10-CM | POA: Insufficient documentation

## 2020-03-25 DIAGNOSIS — Y9289 Other specified places as the place of occurrence of the external cause: Secondary | ICD-10-CM | POA: Insufficient documentation

## 2020-03-25 DIAGNOSIS — X501XXA Overexertion from prolonged static or awkward postures, initial encounter: Secondary | ICD-10-CM | POA: Diagnosis not present

## 2020-03-25 DIAGNOSIS — R609 Edema, unspecified: Secondary | ICD-10-CM

## 2020-03-25 DIAGNOSIS — R2681 Unsteadiness on feet: Secondary | ICD-10-CM | POA: Insufficient documentation

## 2020-03-25 DIAGNOSIS — Z79899 Other long term (current) drug therapy: Secondary | ICD-10-CM | POA: Insufficient documentation

## 2020-03-25 DIAGNOSIS — Y9389 Activity, other specified: Secondary | ICD-10-CM | POA: Diagnosis not present

## 2020-03-25 DIAGNOSIS — M25462 Effusion, left knee: Secondary | ICD-10-CM | POA: Diagnosis not present

## 2020-03-25 DIAGNOSIS — I25119 Atherosclerotic heart disease of native coronary artery with unspecified angina pectoris: Secondary | ICD-10-CM | POA: Insufficient documentation

## 2020-03-25 DIAGNOSIS — R2242 Localized swelling, mass and lump, left lower limb: Secondary | ICD-10-CM | POA: Diagnosis not present

## 2020-03-25 DIAGNOSIS — Z87891 Personal history of nicotine dependence: Secondary | ICD-10-CM | POA: Diagnosis not present

## 2020-03-25 DIAGNOSIS — E119 Type 2 diabetes mellitus without complications: Secondary | ICD-10-CM | POA: Insufficient documentation

## 2020-03-25 DIAGNOSIS — Y999 Unspecified external cause status: Secondary | ICD-10-CM | POA: Diagnosis not present

## 2020-03-25 DIAGNOSIS — M6281 Muscle weakness (generalized): Secondary | ICD-10-CM | POA: Diagnosis not present

## 2020-03-25 DIAGNOSIS — R52 Pain, unspecified: Secondary | ICD-10-CM | POA: Diagnosis not present

## 2020-03-25 DIAGNOSIS — Z950 Presence of cardiac pacemaker: Secondary | ICD-10-CM | POA: Insufficient documentation

## 2020-03-25 DIAGNOSIS — S80912A Unspecified superficial injury of left knee, initial encounter: Secondary | ICD-10-CM | POA: Diagnosis not present

## 2020-03-25 DIAGNOSIS — R0902 Hypoxemia: Secondary | ICD-10-CM | POA: Diagnosis not present

## 2020-03-25 DIAGNOSIS — I10 Essential (primary) hypertension: Secondary | ICD-10-CM | POA: Insufficient documentation

## 2020-03-25 DIAGNOSIS — M25562 Pain in left knee: Secondary | ICD-10-CM | POA: Diagnosis not present

## 2020-03-25 DIAGNOSIS — Z951 Presence of aortocoronary bypass graft: Secondary | ICD-10-CM | POA: Insufficient documentation

## 2020-03-25 DIAGNOSIS — I959 Hypotension, unspecified: Secondary | ICD-10-CM | POA: Diagnosis not present

## 2020-03-25 LAB — CBG MONITORING, ED: Glucose-Capillary: 150 mg/dL — ABNORMAL HIGH (ref 70–99)

## 2020-03-25 MED ORDER — TRAMADOL HCL 50 MG PO TABS
50.0000 mg | ORAL_TABLET | Freq: Once | ORAL | Status: AC
  Administered 2020-03-25: 50 mg via ORAL
  Filled 2020-03-25: qty 1

## 2020-03-25 MED ORDER — TRAMADOL HCL 50 MG PO TABS: 50.0000 mg | ORAL_TABLET | Freq: Four times a day (QID) | ORAL | 0 refills | Status: DC | PRN

## 2020-03-25 MED ORDER — LIDOCAINE HCL (PF) 1 % IJ SOLN
5.0000 mL | Freq: Once | INTRAMUSCULAR | Status: AC
  Administered 2020-03-25: 5 mL via INTRADERMAL
  Filled 2020-03-25: qty 5

## 2020-03-25 NOTE — ED Notes (Signed)
Pt given tramadol by float RN. Float RN to document medication.

## 2020-03-25 NOTE — ED Triage Notes (Signed)
Pt to ED via EMS from home c/o left knee pain secondary to injury that happened one week ago. Apparently pt was getting dog in his car, and twisted his knee. Reports pain and swelling progressively getting worse with time and now can not bear weight to left knee. Pt alert and oriented. No medications given by EMS. Last VS: BP 130/68, HR 80. RR 16, 98% RA, cbg 144, temp 98.5

## 2020-03-25 NOTE — ED Provider Notes (Signed)
[facility] EMERGENCY DEPARTMENT Provider Note   CSN: [id number] Arrival date & time: 03/25/20  1093     History Chief Complaint  Patient presents with   Knee Injury    LEFT   Knee Pain    Jesse [name] is a 79 y.o. male.  HPI He presents for evaluation of left knee pain with swelling for about a week, which he first noticed after getting into his car and feeling a twisting motion.  The swelling is persistent and prevents movement and ambulation secondary to pain.  No prior similar problems.  No other injuries.  He takes Xarelto.  He denies fever, chills, nausea, vomiting, cough, shortness of breath, weakness or dizziness.  There are no other known modifying factors.    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   Coronary artery disease    High cholesterol    Hypertension    Myocardial infarction [facility]) 2017/2018?   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    Past Surgical  History:  Procedure Laterality Date   CARDIAC CATHETERIZATION N/A 02/04/2015   Procedure: Left Heart Cath and Cors/Grafts Angiography;  Surgeon: Adrian Prows, MD;  Location: [place] CV LAB;  Service: Cardiovascular;  Laterality: N/A;   CARDIAC CATHETERIZATION N/A 02/04/2015   Procedure: Coronary Stent Intervention;  Surgeon: Adrian Prows, MD;  Location: [place] CV LAB;  Service: Cardiovascular;  Laterality: N/A;   CORONARY ANGIOGRAPHY N/A 10/18/2017   Procedure: CORONARY ANGIOGRAPHY;  Surgeon: Adrian Prows, MD;  Location: [place] CV LAB;  Service: Cardiovascular;  Laterality: N/A;   CORONARY ANGIOPLASTY     CORONARY ANGIOPLASTY WITH STENT PLACEMENT     "i've got 5-6 stents total" (08/30/2017)   CORONARY ARTERY BYPASS GRAFT  2007   CABG X3   CORONARY ATHERECTOMY N/A 10/18/2017   Procedure: CORONARY ATHERECTOMY;  Surgeon: Adrian Prows, MD;  Location: [place] CV LAB;  Service: Cardiovascular;  Laterality: N/A;   CORONARY ATHERECTOMY N/A 11/29/2017   Procedure: CORONARY ATHERECTOMY;  Surgeon: Adrian Prows, MD;  Location: [place] CV LAB;  Service: Cardiovascular;  Laterality: N/A;   CORONARY CTO INTERVENTION N/A 08/30/2017   Procedure: CORONARY CTO INTERVENTION;  Surgeon: Adrian Prows, MD;  Location: [place] CV LAB;  Service: Cardiovascular;  Laterality: N/A;   CORONARY STENT INTERVENTION N/A 11/29/2017   Procedure: CORONARY STENT INTERVENTION;  Surgeon: Adrian Prows, MD;  Location: [place] CV LAB;  Service: Cardiovascular;  Laterality: N/A;   INSERT / REPLACE / REMOVE PACEMAKER  ?  2012   "I think it was placed in 2012"   LEFT HEART CATH AND CORONARY ANGIOGRAPHY N/A 08/30/2017   Procedure: LEFT HEART CATH AND CORONARY ANGIOGRAPHY;  Surgeon: Adrian Prows, MD;  Location: [place] CV LAB;  Service: Cardiovascular;  Laterality: N/A;   LEFT HEART CATH AND CORS/GRAFTS ANGIOGRAPHY N/A 08/05/2017   Procedure: LEFT HEART CATH AND CORS/GRAFTS ANGIOGRAPHY;  Surgeon: Adrian Prows, MD;  Location: [place] CV LAB;  Service: Cardiovascular;  Laterality: N/A;       Family History  Problem Relation Age of Onset   Lung cancer Mother    Stroke Father    Febrile seizures Brother     Social History   Tobacco Use   Smoking status: Former Smoker    Years: 3.00    Types: Cigarettes    Quit date: 1960    Years since quitting: 61.7   Smokeless tobacco: Never Used  Scientific laboratory technician Use: Never used  Substance Use Topics   Alcohol use: No   Drug use: No    Home Medications Prior to Admission medications   Medication Sig Start Date End Date Taking? Authorizing Provider  Biotin 5000 MCG TABS Take 5,000 mcg by mouth daily.    [provider]  dorzolamide-timolol (COSOPT) 22.3-6.8 MG/ML ophthalmic solution Place 1 drop into both eyes 2 (two) times daily. 05/22/17   [provider]  ezetimibe (ZETIA) 10 MG tablet Take 1 tablet (10 mg total) by mouth daily after supper. 01/24/20 04/23/20  Adrian Prows, MD  glimepiride (AMARYL) 2 MG tablet Take 2 mg by mouth 2 (two) times daily.    [provider]  latanoprost (XALATAN) 0.005 % ophthalmic solution Place 1 drop into both eyes at bedtime. 06/04/17   [provider]  losartan (COZAAR) 25 MG tablet TAKE 1 TABLET BY MOUTH DAILY 11/02/19   Adrian Prows, MD  melatonin 5 MG TABS Take 5 mg by mouth.    [provider]  metFORMIN (GLUCOPHAGE-XR) 500 MG 24 hr tablet Take 1 tablet (500 mg total) by mouth daily. 08/07/17   Adrian Prows, MD  metoprolol tartrate (LOPRESSOR) 25 MG tablet TAKE 1 TABLET BY MOUTH TWICE DAILY 02/22/20   Adrian Prows, MD  nitroGLYCERIN (NITROSTAT) 0.4 MG SL tablet Place 1 tablet (0.4 mg total) under the tongue every 5 (five) minutes as needed for chest pain. 01/24/20   Adrian Prows, MD  simvastatin (ZOCOR) 40 MG tablet Take 40 mg by mouth daily.    [provider]  traMADol (ULTRAM) 50 MG tablet Take 1 tablet (50 mg total) by mouth every 6 (six) hours as needed for moderate pain.  03/25/20   Daleen Bo, MD  TRUE METRIX BLOOD GLUCOSE TEST test strip TEST 3 TIMES D 07/10/18   [provider]  vitamin B-12 (CYANOCOBALAMIN) 1000 MCG tablet Take 1,000 mcg by mouth daily.    [provider]  XARELTO 20 MG TABS tablet TAKE 1 TABLET(20 MG) BY MOUTH DAILY WITH SUPPER Patient taking differently: Take 20 mg by mouth daily.  12/13/19   Adrian Prows, MD    Allergies    Brilinta [ticagrelor]  Review of Systems   Review of Systems  All other systems reviewed and are negative.   Physical Exam Updated Vital Signs BP 128/67    Pulse 83    Temp 99 F (37.2 C) (Oral)    Resp 16    Ht 6\' 2"  (1.88 m)    Wt 88.9 kg  SpO2 95%    BMI 25.16 kg/m   Physical Exam Vitals and nursing note reviewed.  Constitutional:      General: He is not in acute distress.    Appearance: He is well-developed. He is not ill-appearing, toxic-appearing or diaphoretic.  HENT:     Head: Normocephalic and atraumatic.     Right Ear: External ear normal.     Left Ear: External ear normal.  Eyes:     Conjunctiva/sclera: Conjunctivae normal.     Pupils: Pupils are equal, round, and reactive to light.  Neck:     Trachea: Phonation normal.  Cardiovascular:     Rate and Rhythm: Normal rate.  Pulmonary:     Effort: Pulmonary effort is normal.  Abdominal:     General: There is no distension.  Musculoskeletal:     Cervical back: Normal range of motion and neck supple.     Comments: Left knee with large effusion.  Intact left knee extensor mechanism.  He guards against movement of the left knee both actively and passively.  Limited motion as possible without severe pain.  No associated erythema or streaking.  Skin:    General: Skin is warm and dry.  Neurological:     Mental Status: He is alert and oriented to person, place, and time.     Cranial Nerves: No cranial nerve deficit.     Sensory: No sensory deficit.     Motor: No abnormal muscle tone.     Coordination: Coordination normal.   Psychiatric:        Mood and Affect: Mood normal.        Behavior: Behavior normal.        Thought Content: Thought content normal.        Judgment: Judgment normal.     ED Results / Procedures / Treatments   Labs (all labs ordered are listed, but only abnormal results are displayed) Labs Reviewed  CBG MONITORING, ED - Abnormal; Notable for the following components:      Result Value   Glucose-Capillary 150 (*)    All other components within normal limits    EKG None  Radiology CT Knee Left Wo Contrast  Result Date: 03/25/2020 CLINICAL DATA:  Left knee pain after injury 1 week ago EXAM: CT OF THE LEFT KNEE WITHOUT CONTRAST TECHNIQUE: Multidetector CT imaging of the left knee was performed according to the standard protocol. Multiplanar CT image reconstructions were also generated. COMPARISON:  X-ray 03/25/2020 FINDINGS: Bones/Joint/Cartilage No acute fracture. No dislocation. Osseous fragmentation at the tibial tubercle with well corticated ossification within the distal patellar tendon. Joint spaces are relatively well preserved. Mild marginal osteophytosis within the patellofemoral compartment. Subcortical cystic changes within the periphery of the medial tibial plateau. There is a moderate to large sized knee joint effusion without fat-fluid level. Moderate-sized Baker's cyst measuring up to 7.1 cm in length. Ligaments Suboptimally assessed by CT. Soft tissue edema adjacent to the medial collateral ligament (series 7, image 50). Muscles and Tendons Fatty atrophy of the medial head of the gastrocnemius muscle. Tendinous structures appear intact. Soft tissues Mild subcutaneous edema the posteromedial aspect of the knee. IMPRESSION: 1. No acute fracture or dislocation of the left knee. Well-corticated ossification within the distal patellar tendon compatible with either remote trauma or chronic tendinopathy. 2. Moderate to large sized knee joint effusion, nonspecific. 3. Moderate-sized  Baker's cyst. 4. Soft tissue edema adjacent to the medial collateral ligament, which may reflect an MCL sprain. 5. Fatty atrophy of the  medial head of the gastrocnemius muscle. Electronically Signed   By: Davina Poke D.O.   On: 03/25/2020 11:10   DG Knee Complete 4 Views Left  Result Date: 03/25/2020 CLINICAL DATA:  Left knee pain after injury that occurred 1 week ago. EXAM: LEFT KNEE - COMPLETE 4+ VIEW COMPARISON:  None. FINDINGS: Corticated lucency through the base of a large osteophyte arising form the tibial tubercle, which may represent an avulsion fracture. No malalignment. Moderate to large joint effusion. Mild multi compartment degenerative changes, greatest in the medial and patellofemoral compartments. Patellar enthesophytes. Soft tissue swelling about the medial knee. Vascular calcifications. IMPRESSION: 1. Lucency through base of the tibial tubercle, which may represent an avulsion fracture. The margins of the lucency are corticated, suggesting it may be remote. Recommend correlation with the presence or absence of point tenderness. 2. Moderate to large joint effusion. Electronically Signed   By: Margaretha Sheffield MD   On: 03/25/2020 08:55    Procedures .Joint Aspiration/Arthrocentesis  Date/Time: 03/25/2020 9:26 AM Performed by: Daleen Bo, MD Authorized by: Daleen Bo, MD   Consent:    Consent obtained:  Verbal   Risks discussed:  Bleeding, infection and incomplete drainage   Alternatives discussed:  No treatment Location:    Location:  Knee   Knee:  L knee Anesthesia (see MAR for exact dosages):    Anesthesia method:  Local infiltration   Local anesthetic:  Lidocaine 1% w/o epi Procedure details:    Preparation: Patient was prepped and draped in usual sterile fashion     Needle gauge: 15G.   Ultrasound guidance: no     Approach:  Medial   Aspirate amount:  0   Steroid injected: no     Specimen collected: no   Post-procedure details:    Dressing:  Adhesive  bandage   Patient tolerance of procedure:  Tolerated well, no immediate complications Comments:     Joint space entered with 15-gauge needle, and no fluid could be aspirated despite large effusion being present.  Suspect hematoma of the joints space.  CT ordered to rule out fracture.   (including critical care time)  Medications Ordered in ED Medications  lidocaine (PF) (XYLOCAINE) 1 % injection 5 mL (5 mLs Intradermal Given by Other 03/25/20 0939)  traMADol (ULTRAM) tablet 50 mg (50 mg Oral Given 03/25/20 1603)    ED Course  I have reviewed the triage vital signs and the nursing notes.  Pertinent labs & imaging results that were available during my care of the patient were reviewed by me and considered in my medical decision making (see chart for details).    MDM Rules/Calculators/A&P                           Patient Vitals for the past 24 hrs:  BP Temp Temp src Pulse Resp SpO2 Height Weight  03/25/20 1200 128/67 -- -- 83 -- 95 % -- --  03/25/20 1145 116/61 -- -- 78 -- 93 % -- --  03/25/20 1130 115/62 -- -- 78 -- 94 % -- --  03/25/20 1115 (!) 111/53 -- -- 84 16 96 % -- --  03/25/20 1000 (!) 116/58 -- -- 76 -- 96 % -- --  03/25/20 0915 111/60 -- -- 79 -- 93 % -- --  03/25/20 0900 (!) 114/57 -- -- 77 -- 93 % -- --  03/25/20 0815 100/64 -- -- 83 -- 95 % -- --  03/25/20 0802 -- -- -- -- -- --  6\' 2"  (1.88 m) 88.9 kg  03/25/20 0800 124/63 99 F (37.2 C) Oral 85 18 96 % -- --    4:57 PM Reevaluation with update and discussion. After initial assessment and treatment, an updated evaluation reveals he feels better with the current treatment.  Findings discussed with the patient and all questions were answered. Daleen Bo   Medical Decision Making:  This patient is presenting for evaluation of left knee pain with swelling and only minimal trauma., which does require a range of treatment options, and is a complaint that involves a high risk of morbidity and mortality. The  differential diagnoses include fracture, internal derangement, bleeding in the knee, arthritis. I decided to review old records, and in summary elderly male, presenting with pain and swelling of the left knee with minimal trauma and currently on anticoagulant, Xarelto.  I did not require additional historical information from anyone.   Radiologic Tests Ordered, included left knee x-ray.  I independently Visualized: Radiographic images, which show no fracture, effusion present    Critical Interventions-clinical evaluation, radiographic imaging, attempted knee aspiration, narcotic analgesia observation, PT evaluation, consultation with transitions of care team, reassessment, arrangements for disposition  After These Interventions, the Patient was reevaluated and was found improved and stable for discharge.  Patient improved, with placement of knee immobilizer, but required some additional assistance.  He currently has at home a walker and wheelchair to help with his ability to get around.  Discussions for skilled rehab, patient declined.  He has capacity to decide.  He has good durable medical equipment at home to help.  Home health services have been ordered to assess and intervene as needed with a range of services.  Patient feels better after narcotic treatment in the ED.  Unfortunately ED effusion, could not be recovered, and I suspect this is secondary to probable bleeding with hematoma causing the effusion appearance and swelling.  He may have internal derangement, but no fracture was seen on advanced imaging with CT.  The patient is stable for discharge with outpatient management.  Orthopedic follow-up is recommended.  Home health services initiated.  CRITICAL CARE-yes Performed by: Daleen Bo  Nursing Notes Reviewed/ Care Coordinated Applicable Imaging Reviewed Interpretation of Laboratory Data incorporated into ED treatment  The patient appears reasonably screened and/or stabilized for  discharge and I doubt any other medical condition or other [facility] requiring further screening, evaluation, or treatment in the ED at this time prior to discharge.  Plan: Home Medications-continue usual; Home Treatments-knee immobilizer when up, mobilization with in-house with walker or wheelchair; return here if the recommended treatment, does not improve the symptoms; Recommended follow up-orthopedic follow-up soon as possible.     Final Clinical Impression(s) / ED Diagnoses Final diagnoses:  Swelling  Pain  Injury of left knee, initial encounter  Effusion of left knee    Rx / DC Orders ED Discharge Orders         Ordered    traMADol (ULTRAM) 50 MG tablet  Every 6 hours PRN        03/25/20 1657           Daleen Bo, MD 03/25/20 1702

## 2020-03-25 NOTE — Evaluation (Signed)
Physical Therapy Evaluation Patient Details Name: Jesse [name] MRN: [id number] DOB: 1941-03-27 Today's Date: 03/25/2020   History of Present Illness  Pt is an 79 y/o male presenting to the ED after injuring L knee. CT revealed joint effusion, but no fracture or dislocation. PMH includes CAD.   Clinical Impression  Pt presenting with problem above and deficits below. Pt only able to tolerate side steps at EOB this session secondary to LLE pain. Pt requiring min A for steadying with use of RW. Discussed SNF with pt, however, pt would prefer to go home at this time. Will require max HH services. May benefit from PT[facility] transport home given he has steps as well. Educated about using WC for mobility within the home to prevent falls. Reports he has one at home. Lives with his wife but reports his wife uses RW and will be unable to physically assist. Reports his daughter can check on him PRN. Will continue to follow acutely to maximize functional mobility independence and safety.     Follow Up Recommendations Home health PT;Supervision/Assistance - 24 hour (HHOT, HHAide; pt refusing SNF at this time)    Equipment Recommendations  None recommended by PT    Recommendations for Other Services       Precautions / Restrictions Precautions Precautions: Fall Required Braces or Orthoses: Knee Immobilizer - Left Restrictions Weight Bearing Restrictions: No      Mobility  Bed Mobility Overal bed mobility: Needs Assistance Bed Mobility: Supine to Sit;Sit to Supine     Supine to sit: Min assist Sit to supine: Min assist   General bed mobility comments: Min A for LLE management.   Transfers Overall transfer level: Needs assistance Equipment used: Rolling walker (2 wheeled) Transfers: Sit to/from Stand Sit to Stand: Min assist         General transfer comment: Min A for steadying assist to stand.   Ambulation/Gait Ambulation/Gait assistance: Min assist   Assistive device: Rolling walker  (2 wheeled) Gait Pattern/deviations: Step-to pattern Gait velocity: Decreased   General Gait Details: Pt took one step away from the bed and reports too much pain and required seated rest. Was able to take side steps at EOB with min A and RW, however, pt bracing BLE on stretcher for increased support. Further mobility limited secondary to pain.   Stairs            Wheelchair Mobility    Modified Rankin (Stroke Patients Only)       Balance Overall balance assessment: Needs assistance Sitting-balance support: No upper extremity supported;Feet supported Sitting balance-Leahy Scale: Fair     Standing balance support: Bilateral upper extremity supported;During functional activity Standing balance-Leahy Scale: Poor Standing balance comment: Reliant on BUE support                              Pertinent Vitals/Pain Pain Assessment: Faces Faces Pain Scale: Hurts whole lot Pain Location: L knee Pain Descriptors / Indicators: Guarding;Grimacing;Aching Pain Intervention(s): Limited activity within patient's tolerance;Monitored during session;Repositioned    Home Living Family/patient expects to be discharged to:: Private residence Living Arrangements: Spouse/significant other Available Help at Discharge: Family;Available PRN/intermittently Type of Home: House Home Access: Stairs to enter Entrance Stairs-Rails: Right;Left;Can reach both Entrance Stairs-Number of Steps: 2-3 Home Layout: One level Home Equipment: Walker - 2 wheels;Wheelchair - manual      Prior Function Level of Independence: Needs assistance   Gait / Transfers Assistance Needed: Pt reports he  has been in bed for 2 weeks and is only getting up if he has to.   ADL's / Homemaking Assistance Needed: Reports he has been sponge bathing. Uses urinal for urination.         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 Increased pain reported with weightbearing.        Communication   Communication: No difficulties  Cognition Arousal/Alertness: Awake/alert Behavior During Therapy: WFL for tasks assessed/performed Overall Cognitive Status: Within Functional Limits for tasks assessed                                        General Comments General comments (skin integrity, edema, etc.): Educated about need for assist with mobility. Also educated about using WC for mobility tasks to increase safety.     Exercises     Assessment/Plan    PT Assessment Patient needs continued PT services  PT Problem List Decreased strength;Decreased balance;Decreased activity tolerance;Decreased mobility;Decreased knowledge of use of DME;Decreased knowledge of precautions;Pain       PT Treatment Interventions Stair training;Gait training;DME instruction;Functional mobility training;Therapeutic activities;Therapeutic exercise;Neuromuscular re-education;Balance training;Wheelchair mobility training;Patient/family education    PT Goals (Current goals can be found in the Care Plan section)  Acute Rehab PT Goals Patient Stated Goal: to go home vs going to SNF  PT Goal Formulation: With patient Time For Goal Achievement: 04/08/20 Potential to Achieve Goals: Fair    Frequency Min 3X/week   Barriers to discharge Decreased caregiver support Reports daughter can only check PRN     Co-evaluation               AM-PAC PT "6 Clicks" Mobility  Outcome Measure Help needed turning from your back to your side while in a flat bed without using bedrails?: A Little Help needed moving from lying on your back to sitting on the side of a flat bed  without using bedrails?: A Little Help needed moving to and from a bed to a chair (including a wheelchair)?: A Little Help needed standing up from a chair using your arms (e.g., wheelchair or bedside chair)?: A Little Help needed to walk in hospital room?: A Lot Help needed climbing 3-5 steps with a railing? : Total 6 Click Score: 15    End of Session Equipment Utilized During Treatment: Gait belt Activity Tolerance: Patient limited by pain Patient left: in bed;with call bell/phone within reach (on stretcher in ED ) Nurse Communication: Mobility status PT Visit Diagnosis: Unsteadiness on feet (R26.81);Muscle weakness (generalized) (M62.81);Pain Pain - Right/Left: Left Pain - part of body: Knee    Time: 1325-1341 PT Time Calculation (min) (ACUTE ONLY): 16 min   Charges:   PT Evaluation $PT Eval Moderate Complexity: 1 Mod         Reuel Derby, PT, DPT  Acute Rehabilitation Services  Pager: [phone] Office: [phone]   Rudean Hitt 03/25/2020, 2:21 PM

## 2020-03-25 NOTE — ED Notes (Signed)
Ortho tech called about this pt. She apologized for the delay as she was working on the [employer]. ED NT placed immobilizer on pt.

## 2020-03-25 NOTE — ED Notes (Signed)
Contacted [facility] to call [facility]

## 2020-03-25 NOTE — ED Notes (Signed)
[facility] called for pt, stated they were 10th in line

## 2020-03-25 NOTE — ED Notes (Signed)
Pt called friend about getting a ride since [facility] is backed up. I spoke with her and she is unable to help due to stairs. Plan is to wait for [facility]. Asked EDP for dinner tray order.

## 2020-03-25 NOTE — Discharge Instructions (Addendum)
It appears you have an injury to left knee which is caused bleeding and swelling.  Wear the knee immobilizer, whenever you are up.  You can try using heat on the sore area 3 or 4 times a day to help the swelling and discomfort.  Try to get help when you need to stand up.  You should mostly use your walker or wheelchair for help with getting around your home.  We are going to have home health come to your home, to help you manage your injury and get the appropriate care that is necessary.  We sent a prescription for tramadol to your pharmacy.  You may already have some at home, so make sure that you do not take the medication twice.

## 2020-03-25 NOTE — ED Notes (Signed)
Pt transported to CT ?

## 2020-03-25 NOTE — ED Notes (Signed)
PT at bedside.

## 2020-03-25 NOTE — ED Notes (Signed)
[place] for pt to have PO, per EDP- pt provided "ED Happy meal" per pt request.

## 2020-03-25 NOTE — ED Notes (Signed)
[facility] called to cancel transport

## 2020-03-25 NOTE — Discharge Planning (Signed)
Jesse [name] J. Jesse Laming, RN, BSN, [place] [phone] RNCM spoke with pt at bedside regarding discharge planning for [place]. Offered pt medicare.gov list of home health agencies to choose from.  Pt chose [facility] to render services. Tiffany of [facility]  notified. Patient made aware that [facility]  will be in contact in 24-48 hours.  No DME needs identified at this time.

## 2020-03-25 NOTE — ED Notes (Signed)
Pt transported to xray 

## 2020-03-26 ENCOUNTER — Ambulatory Visit: Payer: PRIVATE HEALTH INSURANCE | Admitting: Cardiology

## 2020-03-27 ENCOUNTER — Telehealth: Payer: Self-pay

## 2020-03-27 NOTE — Telephone Encounter (Signed)
I got a call from home health asking if you would sign some orders for pt to have PT. He fell and hurt his knee   [phone]

## 2020-03-27 NOTE — Telephone Encounter (Signed)
I am happy to do it. I am not here tomorrow, maybe [place]. Or PCP

## 2020-03-28 ENCOUNTER — Ambulatory Visit: Payer: PRIVATE HEALTH INSURANCE | Admitting: Cardiology

## 2020-04-01 ENCOUNTER — Ambulatory Visit: Payer: PRIVATE HEALTH INSURANCE | Admitting: Orthopaedic Surgery

## 2020-04-02 ENCOUNTER — Encounter (HOSPITAL_COMMUNITY): Payer: Self-pay | Admitting: *Deleted

## 2020-04-02 ENCOUNTER — Inpatient Hospital Stay (HOSPITAL_COMMUNITY)
Admission: EM | Admit: 2020-04-02 | Discharge: 2020-04-07 | DRG: 548 | Disposition: A | Discharge status: Skilled Nursing Facility | Payer: Medicare HMO | Attending: Family Medicine | Admitting: Family Medicine

## 2020-04-02 ENCOUNTER — Inpatient Hospital Stay (HOSPITAL_COMMUNITY): Payer: Medicare HMO

## 2020-04-02 ENCOUNTER — Other Ambulatory Visit: Payer: Self-pay

## 2020-04-02 DIAGNOSIS — Z7401 Bed confinement status: Secondary | ICD-10-CM | POA: Diagnosis not present

## 2020-04-02 DIAGNOSIS — Z951 Presence of aortocoronary bypass graft: Secondary | ICD-10-CM

## 2020-04-02 DIAGNOSIS — I130 Hypertensive heart and chronic kidney disease with heart failure and stage 1 through stage 4 chronic kidney disease, or unspecified chronic kidney disease: Secondary | ICD-10-CM | POA: Diagnosis present

## 2020-04-02 DIAGNOSIS — L8991 Pressure ulcer of unspecified site, stage 1: Secondary | ICD-10-CM | POA: Diagnosis present

## 2020-04-02 DIAGNOSIS — B965 Pseudomonas (aeruginosa) (mallei) (pseudomallei) as the cause of diseases classified elsewhere: Secondary | ICD-10-CM | POA: Diagnosis not present

## 2020-04-02 DIAGNOSIS — E1110 Type 2 diabetes mellitus with ketoacidosis without coma: Secondary | ICD-10-CM | POA: Diagnosis present

## 2020-04-02 DIAGNOSIS — R112 Nausea with vomiting, unspecified: Secondary | ICD-10-CM | POA: Diagnosis not present

## 2020-04-02 DIAGNOSIS — E871 Hypo-osmolality and hyponatremia: Secondary | ICD-10-CM | POA: Diagnosis present

## 2020-04-02 DIAGNOSIS — Z955 Presence of coronary angioplasty implant and graft: Secondary | ICD-10-CM | POA: Diagnosis not present

## 2020-04-02 DIAGNOSIS — Z7984 Long term (current) use of oral hypoglycemic drugs: Secondary | ICD-10-CM | POA: Diagnosis not present

## 2020-04-02 DIAGNOSIS — M00262 Other streptococcal arthritis, left knee: Secondary | ICD-10-CM | POA: Diagnosis not present

## 2020-04-02 DIAGNOSIS — R2681 Unsteadiness on feet: Secondary | ICD-10-CM | POA: Diagnosis not present

## 2020-04-02 DIAGNOSIS — R5381 Other malaise: Secondary | ICD-10-CM | POA: Diagnosis not present

## 2020-04-02 DIAGNOSIS — R1111 Vomiting without nausea: Secondary | ICD-10-CM | POA: Diagnosis not present

## 2020-04-02 DIAGNOSIS — I252 Old myocardial infarction: Secondary | ICD-10-CM | POA: Diagnosis not present

## 2020-04-02 DIAGNOSIS — R2689 Other abnormalities of gait and mobility: Secondary | ICD-10-CM | POA: Diagnosis not present

## 2020-04-02 DIAGNOSIS — Z9889 Other specified postprocedural states: Secondary | ICD-10-CM

## 2020-04-02 DIAGNOSIS — N183 Chronic kidney disease, stage 3 unspecified: Secondary | ICD-10-CM | POA: Diagnosis present

## 2020-04-02 DIAGNOSIS — I25119 Atherosclerotic heart disease of native coronary artery with unspecified angina pectoris: Secondary | ICD-10-CM | POA: Diagnosis not present

## 2020-04-02 DIAGNOSIS — M6281 Muscle weakness (generalized): Secondary | ICD-10-CM | POA: Diagnosis not present

## 2020-04-02 DIAGNOSIS — N179 Acute kidney failure, unspecified: Secondary | ICD-10-CM | POA: Diagnosis not present

## 2020-04-02 DIAGNOSIS — Z950 Presence of cardiac pacemaker: Secondary | ICD-10-CM | POA: Diagnosis not present

## 2020-04-02 DIAGNOSIS — D649 Anemia, unspecified: Secondary | ICD-10-CM | POA: Diagnosis present

## 2020-04-02 DIAGNOSIS — E1122 Type 2 diabetes mellitus with diabetic chronic kidney disease: Secondary | ICD-10-CM | POA: Diagnosis present

## 2020-04-02 DIAGNOSIS — I495 Sick sinus syndrome: Secondary | ICD-10-CM | POA: Diagnosis present

## 2020-04-02 DIAGNOSIS — M009 Pyogenic arthritis, unspecified: Secondary | ICD-10-CM | POA: Diagnosis not present

## 2020-04-02 DIAGNOSIS — I2510 Atherosclerotic heart disease of native coronary artery without angina pectoris: Secondary | ICD-10-CM | POA: Diagnosis present

## 2020-04-02 DIAGNOSIS — M25462 Effusion, left knee: Secondary | ICD-10-CM | POA: Diagnosis not present

## 2020-04-02 DIAGNOSIS — Z20822 Contact with and (suspected) exposure to covid-19: Secondary | ICD-10-CM | POA: Diagnosis present

## 2020-04-02 DIAGNOSIS — I5032 Chronic diastolic (congestive) heart failure: Secondary | ICD-10-CM | POA: Diagnosis present

## 2020-04-02 DIAGNOSIS — E869 Volume depletion, unspecified: Secondary | ICD-10-CM | POA: Diagnosis present

## 2020-04-02 DIAGNOSIS — Z66 Do not resuscitate: Secondary | ICD-10-CM | POA: Diagnosis not present

## 2020-04-02 DIAGNOSIS — M2550 Pain in unspecified joint: Secondary | ICD-10-CM | POA: Diagnosis not present

## 2020-04-02 DIAGNOSIS — D696 Thrombocytopenia, unspecified: Secondary | ICD-10-CM | POA: Diagnosis present

## 2020-04-02 DIAGNOSIS — M00862 Arthritis due to other bacteria, left knee: Principal | ICD-10-CM | POA: Diagnosis present

## 2020-04-02 DIAGNOSIS — M6259 Muscle wasting and atrophy, not elsewhere classified, multiple sites: Secondary | ICD-10-CM | POA: Diagnosis not present

## 2020-04-02 DIAGNOSIS — I10 Essential (primary) hypertension: Secondary | ICD-10-CM | POA: Diagnosis not present

## 2020-04-02 DIAGNOSIS — R739 Hyperglycemia, unspecified: Secondary | ICD-10-CM | POA: Diagnosis not present

## 2020-04-02 DIAGNOSIS — R1110 Vomiting, unspecified: Secondary | ICD-10-CM | POA: Diagnosis not present

## 2020-04-02 DIAGNOSIS — Z87891 Personal history of nicotine dependence: Secondary | ICD-10-CM

## 2020-04-02 DIAGNOSIS — E785 Hyperlipidemia, unspecified: Secondary | ICD-10-CM | POA: Diagnosis present

## 2020-04-02 DIAGNOSIS — J189 Pneumonia, unspecified organism: Secondary | ICD-10-CM

## 2020-04-02 DIAGNOSIS — I214 Non-ST elevation (NSTEMI) myocardial infarction: Secondary | ICD-10-CM | POA: Diagnosis not present

## 2020-04-02 LAB — BASIC METABOLIC PANEL
Anion gap: 18 — ABNORMAL HIGH (ref 5–15)
Anion gap: 15 (ref 5–15)
BUN: 35 mg/dL — ABNORMAL HIGH (ref 8–23)
BUN: 35 mg/dL — ABNORMAL HIGH (ref 8–23)
CO2: 15 mmol/L — ABNORMAL LOW (ref 22–32)
CO2: 21 mmol/L — ABNORMAL LOW (ref 22–32)
Calcium: 9.1 mg/dL (ref 8.9–10.3)
Calcium: 9.6 mg/dL (ref 8.9–10.3)
Chloride: 101 mmol/L (ref 98–111)
Chloride: 99 mmol/L (ref 98–111)
Creatinine, Ser: 1.46 mg/dL — ABNORMAL HIGH (ref 0.61–1.24)
Creatinine, Ser: 1.29 mg/dL — ABNORMAL HIGH (ref 0.61–1.24)
GFR calc Af Amer: 52 mL/min — ABNORMAL LOW (ref >60)
GFR calc Af Amer: >60 mL/min (ref >60)
GFR calc non Af Amer: 45 mL/min — ABNORMAL LOW (ref >60)
GFR calc non Af Amer: 52 mL/min — ABNORMAL LOW (ref >60)
Glucose, Bld: 311 mg/dL — ABNORMAL HIGH (ref 70–99)
Glucose, Bld: 159 mg/dL — ABNORMAL HIGH (ref 70–99)
Potassium: 5.3 mmol/L — ABNORMAL HIGH (ref 3.5–5.1)
Potassium: 4.6 mmol/L (ref 3.5–5.1)
Sodium: 134 mmol/L — ABNORMAL LOW (ref 135–145)
Sodium: 135 mmol/L (ref 135–145)

## 2020-04-02 LAB — GLUCOSE, CAPILLARY
Glucose-Capillary: 138 mg/dL — ABNORMAL HIGH (ref 70–99)
Glucose-Capillary: 134 mg/dL — ABNORMAL HIGH (ref 70–99)
Glucose-Capillary: 153 mg/dL — ABNORMAL HIGH (ref 70–99)

## 2020-04-02 LAB — COMPREHENSIVE METABOLIC PANEL
ALT: 78 U/L — ABNORMAL HIGH (ref 0–44)
AST: 64 U/L — ABNORMAL HIGH (ref 15–41)
Albumin: 3 g/dL — ABNORMAL LOW (ref 3.5–5.0)
Alkaline Phosphatase: 127 U/L — ABNORMAL HIGH (ref 38–126)
Anion gap: 22 — ABNORMAL HIGH (ref 5–15)
BUN: 27 mg/dL — ABNORMAL HIGH (ref 8–23)
CO2: 15 mmol/L — ABNORMAL LOW (ref 22–32)
Calcium: 9.9 mg/dL (ref 8.9–10.3)
Chloride: 95 mmol/L — ABNORMAL LOW (ref 98–111)
Creatinine, Ser: 1.42 mg/dL — ABNORMAL HIGH (ref 0.61–1.24)
GFR calc Af Amer: 54 mL/min — ABNORMAL LOW (ref >60)
GFR calc non Af Amer: 46 mL/min — ABNORMAL LOW (ref >60)
Glucose, Bld: 305 mg/dL — ABNORMAL HIGH (ref 70–99)
Potassium: 5 mmol/L (ref 3.5–5.1)
Sodium: 132 mmol/L — ABNORMAL LOW (ref 135–145)
Total Bilirubin: 3.9 mg/dL — ABNORMAL HIGH (ref 0.3–1.2)
Total Protein: 7.2 g/dL (ref 6.5–8.1)

## 2020-04-02 LAB — BLOOD GAS, VENOUS
Acid-base deficit: 5.7 mmol/L — ABNORMAL HIGH (ref 0.0–2.0)
Bicarbonate: 18.9 mmol/L — ABNORMAL LOW (ref 20.0–28.0)
O2 Saturation: 32.7 %
Patient temperature: 98.6
pCO2, Ven: 36.2 mmHg — ABNORMAL LOW (ref 44.0–60.0)
pH, Ven: 7.337 (ref 7.250–7.430)
pO2, Ven: <31 mmHg — CL (ref 32.0–45.0)

## 2020-04-02 LAB — CBC
HCT: 44 % (ref 39.0–52.0)
Hemoglobin: 14.7 g/dL (ref 13.0–17.0)
MCH: 33.3 pg (ref 26.0–34.0)
MCHC: 33.4 g/dL (ref 30.0–36.0)
MCV: 99.5 fL (ref 80.0–100.0)
Platelets: 455 10*3/uL — ABNORMAL HIGH (ref 150–400)
RBC: 4.42 MIL/uL (ref 4.22–5.81)
RDW: 11.9 % (ref 11.5–15.5)
WBC: 9 10*3/uL (ref 4.0–10.5)
nRBC: 0 % (ref 0.0–0.2)

## 2020-04-02 LAB — CBG MONITORING, ED
Glucose-Capillary: 271 mg/dL — ABNORMAL HIGH (ref 70–99)
Glucose-Capillary: 267 mg/dL — ABNORMAL HIGH (ref 70–99)

## 2020-04-02 LAB — HEMOGLOBIN A1C
Hgb A1c MFr Bld: 6.4 % — ABNORMAL HIGH (ref 4.8–5.6)
Mean Plasma Glucose: 136.98 mg/dL

## 2020-04-02 LAB — BETA-HYDROXYBUTYRIC ACID
Beta-Hydroxybutyric Acid: 4.12 mmol/L — ABNORMAL HIGH (ref 0.05–0.27)
Beta-Hydroxybutyric Acid: 2.53 mmol/L — ABNORMAL HIGH (ref 0.05–0.27)

## 2020-04-02 LAB — LIPASE, BLOOD: Lipase: 37 U/L (ref 11–51)

## 2020-04-02 LAB — SARS CORONAVIRUS 2 BY RT PCR (HOSPITAL ORDER, PERFORMED IN ~~LOC~~ HOSPITAL LAB): SARS Coronavirus 2: NEGATIVE

## 2020-04-02 MED ORDER — METOPROLOL TARTRATE 25 MG PO TABS
25.0000 mg | ORAL_TABLET | Freq: Two times a day (BID) | ORAL | Status: DC
  Administered 2020-04-02 – 2020-04-07 (×10): 25 mg via ORAL
  Filled 2020-04-02 (×10): qty 1

## 2020-04-02 MED ORDER — SODIUM CHLORIDE 0.9 % IV BOLUS
1000.0000 mL | Freq: Once | INTRAVENOUS | Status: AC
  Administered 2020-04-02: 1000 mL via INTRAVENOUS

## 2020-04-02 MED ORDER — LATANOPROST 0.005 % OP SOLN
1.0000 [drp] | Freq: Every day | OPHTHALMIC | Status: DC
  Administered 2020-04-02 – 2020-04-06 (×5): 1 [drp] via OPHTHALMIC
  Filled 2020-04-02: qty 2.5

## 2020-04-02 MED ORDER — INSULIN REGULAR(HUMAN) IN NACL 100-0.9 UT/100ML-% IV SOLN: INTRAVENOUS | Status: DC

## 2020-04-02 MED ORDER — LACTATED RINGERS IV SOLN: INTRAVENOUS | Status: DC

## 2020-04-02 MED ORDER — DEXTROSE IN LACTATED RINGERS 5 % IV SOLN: INTRAVENOUS | Status: DC

## 2020-04-02 MED ORDER — RIVAROXABAN 20 MG PO TABS
20.0000 mg | ORAL_TABLET | Freq: Every day | ORAL | Status: DC
  Administered 2020-04-02 – 2020-04-07 (×5): 20 mg via ORAL
  Filled 2020-04-02 (×6): qty 1

## 2020-04-02 MED ORDER — DORZOLAMIDE HCL-TIMOLOL MAL 2-0.5 % OP SOLN
1.0000 [drp] | Freq: Two times a day (BID) | OPHTHALMIC | Status: DC
  Administered 2020-04-02 – 2020-04-07 (×10): 1 [drp] via OPHTHALMIC
  Filled 2020-04-02: qty 10

## 2020-04-02 MED ORDER — INSULIN REGULAR(HUMAN) IN NACL 100-0.9 UT/100ML-% IV SOLN
INTRAVENOUS | Status: DC
  Administered 2020-04-02: 10.5 [IU]/h via INTRAVENOUS
  Filled 2020-04-02: qty 100

## 2020-04-02 MED ORDER — NITROGLYCERIN 0.4 MG SL SUBL: 0.4000 mg | SUBLINGUAL_TABLET | SUBLINGUAL | Status: DC | PRN

## 2020-04-02 MED ORDER — LACTATED RINGERS IV BOLUS: 20.0000 mL/kg | Freq: Once | INTRAVENOUS | Status: DC

## 2020-04-02 MED ORDER — POTASSIUM CHLORIDE 10 MEQ/100ML IV SOLN
10.0000 meq | INTRAVENOUS | Status: AC
  Administered 2020-04-02 (×2): 10 meq via INTRAVENOUS
  Filled 2020-04-02 (×2): qty 100

## 2020-04-02 MED ORDER — DEXTROSE 50 % IV SOLN: 0.0000 mL | INTRAVENOUS | Status: DC | PRN

## 2020-04-02 MED ORDER — ONDANSETRON HCL 4 MG/2ML IJ SOLN
4.0000 mg | Freq: Once | INTRAMUSCULAR | Status: AC
  Administered 2020-04-02: 4 mg via INTRAVENOUS
  Filled 2020-04-02: qty 2

## 2020-04-02 MED ORDER — LACTATED RINGERS IV BOLUS
20.0000 mL/kg | Freq: Once | INTRAVENOUS | Status: AC
  Administered 2020-04-02: 1724 mL via INTRAVENOUS

## 2020-04-02 NOTE — ED Notes (Signed)
Assumed care of patient at this time, nad noted, sr up x2, bed locked and low, call bell w/I reach.  Will continue to monitor.  Attempted to call report but the charge nurse was busy.  Will call back.

## 2020-04-02 NOTE — H&P (Signed)
HPI  Jesse [name] KWI:[id number] DOB: [birth year] DOA: 04/02/2020  PCP: Deland Pretty, MD   Chief Complaint: Feeling ill  HPI:  79 year old white male CAD three-vessel CABG DES 11/29/2017 Dr. 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 Diabetes mellitus nuclear stress showed EF 45-50% 02/06/2020  Came to emergency room 03/25/2020 painful knee swelling persist with movement-underwent arthrocentesis no fluid could be aspirated felt this was a hematoma CT was performed showing no fracture-Home health services ordered patient was given some narcotics on discharge and told to follow-up with orthopedic doctor Erlinda Hong of Ortho who he has not seen yet He was given a knee immobilizer  Patient was then sent home and he was not given any specific steroids or anything to account for his hyperglycemia  Returns to ED 9/8 with multiple episodes of vomiting inability to eat for the past several days He states that sugars are usually well controlled in the 200 range found to be in DKA with AG=22, CO2 15 Potassium 5.0, sod 132  EKG ordered by me chest x-ray ordered by me  Review of Systems:   Pertinent +'s: Admits to cough, sputum, some diarrhea, nausea, vomiting of 100.6, swelling and pain of left knee Pertinent -"s: Denies unilateral weakness strokelike symptoms recent fall, fever  ED Course: Kept on the Glucomander, fluids given meds resumed   Past Medical History:  Diagnosis Date  . Angina pectoris ([place]) 02/03/2015  . CAD (coronary artery disease), native coronary artery 08/30/2018   2007 [place], [place]: LIMA to LAD, Occluded SVG to RCA and OM by angiogram S/P  PCI/orbital atherectomy S/P 2.5x15 Resoluute to prox Cx on 11/29/2017.  Marland Kitchen Coronary artery disease   . High cholesterol   . Hypertension   . Myocardial infarction [facility]) 2017/2018?  . NSTEMI (non-ST elevated myocardial infarction) ([place]) 02/03/2015  . Presence of permanent cardiac  pacemaker   . Type II diabetes mellitus ([place])    Past Surgical History:  Procedure Laterality Date  . CARDIAC CATHETERIZATION N/A 02/04/2015   Procedure: Left Heart Cath and Cors/Grafts Angiography;  Surgeon: Adrian Prows, MD;  Location: [place] [facility];  Service: Cardiovascular;  Laterality: N/A;  . CARDIAC CATHETERIZATION N/A 02/04/2015   Procedure: Coronary Stent Intervention;  Surgeon: Adrian Prows, MD;  Location: [place] [facility];  Service: Cardiovascular;  Laterality: N/A;  . CORONARY ANGIOGRAPHY N/A 10/18/2017   Procedure: CORONARY ANGIOGRAPHY;  Surgeon: Adrian Prows, MD;  Location: [place] [facility];  Service: Cardiovascular;  Laterality: N/A;  . CORONARY ANGIOPLASTY    . CORONARY ANGIOPLASTY WITH STENT PLACEMENT     "i've got 5-6 stents total" (08/30/2017)  . CORONARY ARTERY BYPASS GRAFT  2007   CABG X3  . CORONARY ATHERECTOMY N/A 10/18/2017   Procedure: CORONARY ATHERECTOMY;  Surgeon: Adrian Prows, MD;  Location: [place] [facility];  Service: Cardiovascular;  Laterality: N/A;  . CORONARY ATHERECTOMY N/A 11/29/2017   Procedure: CORONARY ATHERECTOMY;  Surgeon: Adrian Prows, MD;  Location: [place] [facility];  Service: Cardiovascular;  Laterality: N/A;  . CORONARY CTO INTERVENTION N/A 08/30/2017   Procedure: CORONARY CTO INTERVENTION;  Surgeon: Adrian Prows, MD;  Location: [place] [facility];  Service: Cardiovascular;  Laterality: N/A;  . CORONARY STENT INTERVENTION N/A 11/29/2017   Procedure: CORONARY STENT INTERVENTION;  Surgeon: Adrian Prows, MD;  Location: [place] [facility];  Service: Cardiovascular;  Laterality: N/A;  . INSERT / REPLACE / REMOVE PACEMAKER  ?2012   "I think it  was placed in 2012"  . LEFT HEART CATH AND CORONARY ANGIOGRAPHY N/A 08/30/2017   Procedure: LEFT HEART CATH AND CORONARY ANGIOGRAPHY;  Surgeon: Adrian Prows, MD;  Location: [place] [facility];  Service: Cardiovascular;  Laterality: N/A;  . LEFT HEART CATH AND CORS/GRAFTS ANGIOGRAPHY N/A 08/05/2017   Procedure: LEFT HEART CATH AND  CORS/GRAFTS ANGIOGRAPHY;  Surgeon: Adrian Prows, MD;  Location: [place] [facility];  Service: Cardiovascular;  Laterality: N/A;    reports that he quit smoking about 61 years ago. His smoking use included cigarettes. He quit after 3.00 years of use. He has never used smokeless tobacco. He reports that he does not drink alcohol and does not use drugs. Independent usually until about a month ago and then has not been really able to ambulate as needed a walker Mobility: indep   Allergies  Allergen Reactions  . Brilinta [Ticagrelor] Shortness Of Breath    Difficulty breathing    Family History  Problem Relation Age of Onset  . Lung cancer Mother   . Stroke Father   . Febrile seizures Brother    Prior to Admission medications   Medication Sig Start Date End Date Taking? Authorizing Provider  Biotin 5000 MCG TABS Take 5,000 mcg by mouth daily.    [provider]  dorzolamide-timolol (COSOPT) 22.3-6.8 MG/ML ophthalmic solution Place 1 drop into both eyes 2 (two) times daily. 05/22/17   [provider]  ezetimibe (ZETIA) 10 MG tablet Take 1 tablet (10 mg total) by mouth daily after supper. 01/24/20 04/23/20  Adrian Prows, MD  glimepiride (AMARYL) 2 MG tablet Take 2 mg by mouth 2 (two) times daily.    [provider]  latanoprost (XALATAN) 0.005 % ophthalmic solution Place 1 drop into both eyes at bedtime. 06/04/17   [provider]  losartan (COZAAR) 25 MG tablet TAKE 1 TABLET BY MOUTH DAILY 11/02/19   Adrian Prows, MD  melatonin 5 MG TABS Take 5 mg by mouth.    [provider]  metFORMIN (GLUCOPHAGE-XR) 500 MG 24 hr tablet Take 1 tablet (500 mg total) by mouth daily. 08/07/17   Adrian Prows, MD  metoprolol tartrate (LOPRESSOR) 25 MG tablet TAKE 1 TABLET BY MOUTH TWICE DAILY 02/22/20   Adrian Prows, MD  nitroGLYCERIN (NITROSTAT) 0.4 MG SL tablet Place 1 tablet (0.4 mg total) under the tongue every 5 (five) minutes as needed for chest pain. 01/24/20   Adrian Prows, MD   simvastatin (ZOCOR) 40 MG tablet Take 40 mg by mouth daily.    [provider]  traMADol (ULTRAM) 50 MG tablet Take 1 tablet (50 mg total) by mouth every 6 (six) hours as needed for moderate pain. 03/25/20   Daleen Bo, MD  TRUE METRIX BLOOD GLUCOSE TEST test strip TEST 3 TIMES D 07/10/18   [provider]  vitamin B-12 (CYANOCOBALAMIN) 1000 MCG tablet Take 1,000 mcg by mouth daily.    [provider]  XARELTO 20 MG TABS tablet TAKE 1 TABLET(20 MG) BY MOUTH DAILY WITH SUPPER Patient taking differently: Take 20 mg by mouth daily.  12/13/19   Adrian Prows, MD    Physical Exam:  Vitals:   04/02/20 1645 04/02/20 1700  BP: 115/65 115/61  Pulse: 93 94  Resp:  20  Temp:    SpO2: 98% 98%     Awake coherent moderate dentition no JVD no bruit  S1-S2 no murmur rub or gallop  Chest clear no rales no rhonchi  Abdomen soft nontender no rebound no  guarding no organomegaly  Left knee is diffusely swollen I do not appreciate joint line tenderness-patient has some ballottement of the knee with the size of the effusion--I do not appreciate crepitus but he has limited range of motion and I did not aggressively manipulate him or do provocative measures to see which ligament may be deranged  I have personally reviewed following labs and imaging studies  Labs:   As above  Imaging studies:   None performed  Medical tests:   EKG independently reviewed: None yet  Test discussed with performing physician:  Discussed with ED physician Dr. Karle Starch  D/w Dr. Tamera Punt ortho who has graciously agreed to see the patient  Decision to obtain old records:   Reviewed  Review and summation of old records:   Reviewed  Active Problems:   * No active hospital problems. *   Assessment/Plan DKA query cause 1. Etiology unclear continue Glucomander allow noncaloric liquids 2. Transition to phase 2 once acidosis is resolved and change fluids accordingly 3. When gap  closes nursing to contact hospitalist for face to order set and transition to insulin 4. Holding at this time Amaryl 2 twice daily Metformin 500 daily Possibility of hematoma versus less likely septic left knee in the setting of nontraumatic effusion? 1. Unclear if joint is infected 2. Long discussion with Dr. Tamera Punt who graciously agrees to see the patient in a.m. 3. Holding at this time antibiotics, MRI per his recommendation 4. Patient had CT of the knee 8/31 which showed may be an MCL disruption CAD history followed by Dr. Einar Gip 1. For now hold statin, losartan given mild rise in creatinine 2. Can continue metoprolol 25 twice daily AKI superimposed on CKD 3 Hyponatremia secondary to osmotic diuresis from DKA 1. Give copious fluids and monitor trends-ARB held 2. Repeat labs a.m. Elevated LFT 1. Unclear etiology, prior labs earlier this year were not elevated 2. Cycle labs a.m. with LFTs and INR HTN 1. As above Sick sinus syndrome Medtronic PPM on Xarelto 1. Continue Xarelto at this time 2. We will keep on telemetry he is slightly tachycardic but this is expected given his vol depleted state    Severity of Illness: The appropriate patient status for this patient is INPATIENT. Inpatient status is judged to be reasonable and necessary in order to provide the required intensity of service to ensure the patient's safety. The patient's presenting symptoms, physical exam findings, and initial radiographic and laboratory data in the context of their chronic comorbidities is felt to place them at high risk for further clinical deterioration. Furthermore, it is not anticipated that the patient will be medically stable for discharge from the hospital within 2 midnights of admission. The following factors support the patient status of inpatient.   " The patient's presenting symptoms include DKA. " The worrisome physical exam findings include effusion. " The initial radiographic and laboratory  data are worrisome because of concern for septic joint. " The chronic co-morbidities include n yes.   * I certify that at the point of admission it is my clinical judgment that the patient will require inpatient hospital care spanning beyond 2 midnights from the point of admission due to high intensity of service, high risk for further deterioration and high frequency of surveillance required.*   DVT prophylaxis: On blood thinner Code Status: DNR confirmed at the bedside Family Communication: None Consults called: Orthopedics  Time spent: 57 minutes  Verlon Au, MD Jerl Mina my NP partners at night for Care related issues] [facility] --  Via [employer] [facility] , [email or web address]; password [facility]  04/02/2020, 5:18 PM

## 2020-04-02 NOTE — ED Triage Notes (Addendum)
BIB EMS N/V x 2 days with streaks of blood, No abd pain noted. 144/70-102-96% CBG 137  Pt has swelling and pain in left knee which is not new.

## 2020-04-02 NOTE — ED Provider Notes (Signed)
[place] EMERGENCY DEPARTMENT Provider Note  CSN: [id number] Arrival date & time: 04/02/20 0745    History Chief Complaint  Patient presents with   Nausea   Emesis    HPI  Jesse [name] is a 79 y.o. male reports he has been feeling ill for '3 months' but more acutely ill in the last several days with multiple episodes of vomiting, unable to keep down solids, but has been drinking water. He reports his urine has been dark, but no increased urinary frequency or dysuria. He reports a fever of 100.54F at home, occasional cough. He also reports a painful swollen L knee which is chronic, he has been seeing ortho for same. He was seen in the ED about a week ago, where arthrocentesis was done, but unable to get any fluid out. Denies diarrhea, no abdominal pain, reports one episode of bloody streaks in emesis.    Past Medical History:  Diagnosis Date   Angina pectoris ([place]) 02/03/2015   CAD (coronary artery disease), native coronary artery 08/30/2018   2007 [place], [place]: LIMA to LAD, Occluded SVG to RCA and OM by angiogram S/P  PCI/orbital atherectomy S/P 2.5x15 Resoluute to prox Cx on 11/29/2017.   Coronary artery disease    High cholesterol    Hypertension    Myocardial infarction [facility]) 2017/2018?   NSTEMI (non-ST elevated myocardial infarction) ([place]) 02/03/2015   Presence of permanent cardiac pacemaker    Type II diabetes mellitus ([place])     Past Surgical History:  Procedure Laterality Date   CARDIAC CATHETERIZATION N/A 02/04/2015   Procedure: Left Heart Cath and Cors/Grafts Angiography;  Surgeon: Adrian Prows, MD;  Location: [place] [facility];  Service: Cardiovascular;  Laterality: N/A;   CARDIAC CATHETERIZATION N/A 02/04/2015   Procedure: Coronary Stent Intervention;  Surgeon: Adrian Prows, MD;  Location: [place] [facility];  Service: Cardiovascular;  Laterality: N/A;   CORONARY ANGIOGRAPHY N/A 10/18/2017   Procedure: CORONARY ANGIOGRAPHY;  Surgeon: Adrian Prows, MD;  Location:  [place] [facility];  Service: Cardiovascular;  Laterality: N/A;   CORONARY ANGIOPLASTY     CORONARY ANGIOPLASTY WITH STENT PLACEMENT     "i've got 5-6 stents total" (08/30/2017)   CORONARY ARTERY BYPASS GRAFT  2007   CABG X3   CORONARY ATHERECTOMY N/A 10/18/2017   Procedure: CORONARY ATHERECTOMY;  Surgeon: Adrian Prows, MD;  Location: [place] [facility];  Service: Cardiovascular;  Laterality: N/A;   CORONARY ATHERECTOMY N/A 11/29/2017   Procedure: CORONARY ATHERECTOMY;  Surgeon: Adrian Prows, MD;  Location: [place] [facility];  Service: Cardiovascular;  Laterality: N/A;   CORONARY CTO INTERVENTION N/A 08/30/2017   Procedure: CORONARY CTO INTERVENTION;  Surgeon: Adrian Prows, MD;  Location: [place] [facility];  Service: Cardiovascular;  Laterality: N/A;   CORONARY STENT INTERVENTION N/A 11/29/2017   Procedure: CORONARY STENT INTERVENTION;  Surgeon: Adrian Prows, MD;  Location: [place] [facility];  Service: Cardiovascular;  Laterality: N/A;   INSERT / REPLACE / REMOVE PACEMAKER  ?2012   "I think it was placed in 2012"   LEFT HEART CATH AND CORONARY ANGIOGRAPHY N/A 08/30/2017   Procedure: LEFT HEART CATH AND CORONARY ANGIOGRAPHY;  Surgeon: Adrian Prows, MD;  Location: [place] [facility];  Service: Cardiovascular;  Laterality: N/A;   LEFT HEART CATH AND CORS/GRAFTS ANGIOGRAPHY N/A 08/05/2017   Procedure: LEFT HEART CATH AND CORS/GRAFTS ANGIOGRAPHY;  Surgeon: Adrian Prows, MD;  Location: [place] [facility];  Service: Cardiovascular;  Laterality: N/A;    Family History  Problem Relation Age of Onset   Lung cancer Mother    Stroke Father    Febrile seizures Brother     Social History   Tobacco Use   Smoking status: Former Smoker    Years: 3.00    Types: Cigarettes    Quit date: 1960    Years since quitting: 61.7   Smokeless tobacco: Never Used  Scientific laboratory technician Use: Never used  Substance Use Topics   Alcohol use: No   Drug use: No     Home Medications Prior to Admission  medications   Medication Sig Start Date End Date Taking? Authorizing Provider  Biotin 5000 MCG TABS Take 5,000 mcg by mouth daily.    [provider]  dorzolamide-timolol (COSOPT) 22.3-6.8 MG/ML ophthalmic solution Place 1 drop into both eyes 2 (two) times daily. 05/22/17   [provider]  ezetimibe (ZETIA) 10 MG tablet Take 1 tablet (10 mg total) by mouth daily after supper. 01/24/20 04/23/20  Adrian Prows, MD  glimepiride (AMARYL) 2 MG tablet Take 2 mg by mouth 2 (two) times daily.    [provider]  latanoprost (XALATAN) 0.005 % ophthalmic solution Place 1 drop into both eyes at bedtime. 06/04/17   [provider]  losartan (COZAAR) 25 MG tablet TAKE 1 TABLET BY MOUTH DAILY 11/02/19   Adrian Prows, MD  melatonin 5 MG TABS Take 5 mg by mouth.    [provider]  metFORMIN (GLUCOPHAGE-XR) 500 MG 24 hr tablet Take 1 tablet (500 mg total) by mouth daily. 08/07/17   Adrian Prows, MD  metoprolol tartrate (LOPRESSOR) 25 MG tablet TAKE 1 TABLET BY MOUTH TWICE DAILY 02/22/20   Adrian Prows, MD  nitroGLYCERIN (NITROSTAT) 0.4 MG SL tablet Place 1 tablet (0.4 mg total) under the tongue every 5 (five) minutes as needed for chest pain. 01/24/20   Adrian Prows, MD  simvastatin (ZOCOR) 40 MG tablet Take 40 mg by mouth daily.    [provider]  traMADol (ULTRAM) 50 MG tablet Take 1 tablet (50 mg total) by mouth every 6 (six) hours as needed for moderate pain. 03/25/20   Daleen Bo, MD  TRUE METRIX BLOOD GLUCOSE TEST test strip TEST 3 TIMES D 07/10/18   [provider]  vitamin B-12 (CYANOCOBALAMIN) 1000 MCG tablet Take 1,000 mcg by mouth daily.    [provider]  XARELTO 20 MG TABS tablet TAKE 1 TABLET(20 MG) BY MOUTH DAILY WITH SUPPER Patient taking differently: Take 20 mg by mouth daily.  12/13/19   Adrian Prows, MD     Allergies    Brilinta [ticagrelor]   Review of Systems   Review of Systems A comprehensive review of systems was completed and  negative except as noted in HPI.    Physical Exam BP 119/63 (BP Location: Right Arm)    Pulse 91    Temp 98.3 F (36.8 C) (Oral)    Resp (!) 22    Ht 6\' 2"  (1.88 m)    Wt 86.2 kg    SpO2 93%    BMI 24.39 kg/m   Physical Exam Vitals and nursing note reviewed.  Constitutional:      Appearance: Normal appearance.  HENT:     Head: Normocephalic and atraumatic.     Nose: Nose normal.     Mouth/Throat:     Mouth: Mucous membranes are dry.  Eyes:     Extraocular Movements: Extraocular movements intact.     Conjunctiva/sclera: Conjunctivae normal.  Cardiovascular:  Rate and Rhythm: Normal rate.  Pulmonary:     Effort: Pulmonary effort is normal.     Breath sounds: Normal breath sounds.     Comments: Pacemaker/AICD in L upper chest Abdominal:     General: Abdomen is flat.     Palpations: Abdomen is soft.     Tenderness: There is no abdominal tenderness.  Musculoskeletal:        General: No swelling. Normal range of motion.     Cervical back: Neck supple.     Comments: L knee joint effusion, no erythema, warmth or other signs of septic joint  Skin:    General: Skin is warm and dry.  Neurological:     General: No focal deficit present.     Mental Status: He is alert.  Psychiatric:        Mood and Affect: Mood normal.      ED Results / Procedures / Treatments   Labs (all labs ordered are listed, but only abnormal results are displayed) Labs Reviewed  COMPREHENSIVE METABOLIC PANEL - Abnormal; Notable for the following components:      Result Value   Sodium 132 (*)    Chloride 95 (*)    CO2 15 (*)    Glucose, Bld 305 (*)    BUN 27 (*)    Creatinine, Ser 1.42 (*)    Albumin 3.0 (*)    AST 64 (*)    ALT 78 (*)    Alkaline Phosphatase 127 (*)    Total Bilirubin 3.9 (*)    GFR calc non Af Amer 46 (*)    GFR calc Af Amer 54 (*)    Anion gap 22 (*)    All other components within normal limits  CBC - Abnormal; Notable for the following components:   Platelets 455 (*)     All other components within normal limits  BLOOD GAS, VENOUS - Abnormal; Notable for the following components:   pCO2, Ven 36.2 (*)    pO2, Ven <31.0 (*)    Bicarbonate 18.9 (*)    Acid-base deficit 5.7 (*)    All other components within normal limits  BETA-HYDROXYBUTYRIC ACID - Abnormal; Notable for the following components:   Beta-Hydroxybutyric Acid 4.12 (*)    All other components within normal limits  SARS CORONAVIRUS 2 BY RT PCR ([facility])  LIPASE, BLOOD  URINALYSIS, ROUTINE W REFLEX MICROSCOPIC  BASIC METABOLIC PANEL  BASIC METABOLIC PANEL  BASIC METABOLIC PANEL  BASIC METABOLIC PANEL  BETA-HYDROXYBUTYRIC ACID  BETA-HYDROXYBUTYRIC ACID  HEMOGLOBIN A1C    EKG None  Radiology No results found.  Procedures Procedures  Medications Ordered in the ED Medications  lactated ringers bolus 1,724 mL (has no administration in time range)  insulin regular, human (MYXREDLIN) 100 units/ 100 mL infusion (has no administration in time range)  lactated ringers infusion (has no administration in time range)  dextrose 5 % in lactated ringers infusion (has no administration in time range)  dextrose 50 % solution 0-50 mL (has no administration in time range)  metoprolol tartrate (LOPRESSOR) tablet 25 mg (has no administration in time range)  nitroGLYCERIN (NITROSTAT) SL tablet 0.4 mg (has no administration in time range)  rivaroxaban (XARELTO) tablet 20 mg (has no administration in time range)  dorzolamide-timolol (COSOPT) 22.3-6.8 MG/ML ophthalmic solution 1 drop (has no administration in time range)  latanoprost (XALATAN) 0.005 % ophthalmic solution 1 drop (has no administration in time range)  lactated ringers bolus 1,724  mL (has no administration in time range)  lactated ringers infusion (has no administration in time range)  dextrose 5 % in lactated ringers infusion (has no administration in time range)  potassium chloride 10 mEq in  100 mL IVPB (has no administration in time range)  insulin regular, human (MYXREDLIN) 100 units/ 100 mL infusion (has no administration in time range)  sodium chloride 0.9 % bolus 1,000 mL (1,000 mLs Intravenous New Bag/Given 04/02/20 1555)  ondansetron (ZOFRAN) injection 4 mg (4 mg Intravenous Given 04/02/20 1555)     MDM Rules/Calculators/A&P MDM Patient with DM here with nausea and vomiting for several days. Labs done in triage show normal WBC but a chemistry panel with elevated glucose and anion gap with low bicarb concerning for DKA vs starvation ketosis. Add VBG and beta-hydroxybutyrate. Initial saline resuscitation. Covid added for anticipated admission. Patient is fully vaccinated.  ED Course  I have reviewed the triage vital signs and the nursing notes.  Pertinent labs & imaging results that were available during my care of the patient were reviewed by me and considered in my medical decision making (see chart for details).  Clinical Course as of Apr 02 1733  Wed Apr 02, 2020  1615 VBG with mild metabolic acidosis.    [CS]  4481 Mildly elevated BHB, will initiate insulin drip per DKA protocol, hospitalist paged for admission.  Beta-Hydroxybutyric Acid(!): 4.12 [CS]  8563 Spoke with Dr. Verlon Au, Hospitalist who will evaluate for admission. Covid is negative.    [CS]    Clinical Course User Index [CS] Truddie Hidden, MD    Final Clinical Impression(s) / ED Diagnoses Final diagnoses:  Diabetic ketoacidosis without coma associated with type 2 diabetes mellitus [facility])    Rx / DC Orders ED Discharge Orders    None       Truddie Hidden, MD 04/02/20 1734

## 2020-04-03 ENCOUNTER — Inpatient Hospital Stay (HOSPITAL_COMMUNITY): Payer: Medicare HMO | Admitting: Certified Registered Nurse Anesthetist

## 2020-04-03 ENCOUNTER — Encounter (HOSPITAL_COMMUNITY)
Admission: EM | Disposition: A | Discharge status: Skilled Nursing Facility | Payer: Self-pay | Source: Home / Self Care | Attending: Family Medicine

## 2020-04-03 HISTORY — PX: KNEE ARTHROSCOPY: SHX127

## 2020-04-03 LAB — BASIC METABOLIC PANEL
Anion gap: 9 (ref 5–15)
Anion gap: 10 (ref 5–15)
BUN: 34 mg/dL — ABNORMAL HIGH (ref 8–23)
BUN: 33 mg/dL — ABNORMAL HIGH (ref 8–23)
CO2: 24 mmol/L (ref 22–32)
CO2: 26 mmol/L (ref 22–32)
Calcium: 9.2 mg/dL (ref 8.9–10.3)
Calcium: 9.2 mg/dL (ref 8.9–10.3)
Chloride: 100 mmol/L (ref 98–111)
Chloride: 99 mmol/L (ref 98–111)
Creatinine, Ser: 1.11 mg/dL (ref 0.61–1.24)
Creatinine, Ser: 1.18 mg/dL (ref 0.61–1.24)
GFR calc Af Amer: >60 mL/min (ref >60)
GFR calc Af Amer: >60 mL/min (ref >60)
GFR calc non Af Amer: >60 mL/min (ref >60)
GFR calc non Af Amer: 58 mL/min — ABNORMAL LOW (ref >60)
Glucose, Bld: 166 mg/dL — ABNORMAL HIGH (ref 70–99)
Glucose, Bld: 147 mg/dL — ABNORMAL HIGH (ref 70–99)
Potassium: 4.2 mmol/L (ref 3.5–5.1)
Potassium: 4.1 mmol/L (ref 3.5–5.1)
Sodium: 133 mmol/L — ABNORMAL LOW (ref 135–145)
Sodium: 135 mmol/L (ref 135–145)

## 2020-04-03 LAB — HEPATIC FUNCTION PANEL
ALT: 59 U/L — ABNORMAL HIGH (ref 0–44)
AST: 37 U/L (ref 15–41)
Albumin: 2.5 g/dL — ABNORMAL LOW (ref 3.5–5.0)
Alkaline Phosphatase: 109 U/L (ref 38–126)
Bilirubin, Direct: 1.1 mg/dL — ABNORMAL HIGH (ref 0.0–0.2)
Indirect Bilirubin: 1 mg/dL — ABNORMAL HIGH (ref 0.3–0.9)
Total Bilirubin: 2.1 mg/dL — ABNORMAL HIGH (ref 0.3–1.2)
Total Protein: 6.3 g/dL — ABNORMAL LOW (ref 6.5–8.1)

## 2020-04-03 LAB — GLUCOSE, CAPILLARY
Glucose-Capillary: 122 mg/dL — ABNORMAL HIGH (ref 70–99)
Glucose-Capillary: 140 mg/dL — ABNORMAL HIGH (ref 70–99)
Glucose-Capillary: 144 mg/dL — ABNORMAL HIGH (ref 70–99)
Glucose-Capillary: 152 mg/dL — ABNORMAL HIGH (ref 70–99)
Glucose-Capillary: 157 mg/dL — ABNORMAL HIGH (ref 70–99)
Glucose-Capillary: 162 mg/dL — ABNORMAL HIGH (ref 70–99)
Glucose-Capillary: 162 mg/dL — ABNORMAL HIGH (ref 70–99)
Glucose-Capillary: 167 mg/dL — ABNORMAL HIGH (ref 70–99)
Glucose-Capillary: 168 mg/dL — ABNORMAL HIGH (ref 70–99)
Glucose-Capillary: 173 mg/dL — ABNORMAL HIGH (ref 70–99)
Glucose-Capillary: 181 mg/dL — ABNORMAL HIGH (ref 70–99)

## 2020-04-03 LAB — SYNOVIAL CELL COUNT + DIFF, W/ CRYSTALS
Crystals, Fluid: NONE SEEN
Lymphocytes-Synovial Fld: 5 % (ref 0–20)
Monocyte-Macrophage-Synovial Fluid: 5 % — ABNORMAL LOW (ref 50–90)
Neutrophil, Synovial: 90 % — ABNORMAL HIGH (ref 0–25)
WBC, Synovial: 92250 /mm3 — ABNORMAL HIGH (ref 0–200)

## 2020-04-03 LAB — PROTIME-INR
INR: 1.5 — ABNORMAL HIGH (ref 0.8–1.2)
Prothrombin Time: 17.7 seconds — ABNORMAL HIGH (ref 11.4–15.2)

## 2020-04-03 LAB — MRSA PCR SCREENING: MRSA by PCR: NEGATIVE

## 2020-04-03 LAB — BETA-HYDROXYBUTYRIC ACID: Beta-Hydroxybutyric Acid: 0.18 mmol/L (ref 0.05–0.27)

## 2020-04-03 SURGERY — ARTHROSCOPY, KNEE
Anesthesia: General | Site: Knee | Laterality: Left

## 2020-04-03 MED ORDER — FENTANYL CITRATE (PF) 100 MCG/2ML IJ SOLN
INTRAMUSCULAR | Status: AC
  Filled 2020-04-03: qty 2

## 2020-04-03 MED ORDER — PROPOFOL 10 MG/ML IV BOLUS
INTRAVENOUS | Status: DC | PRN
  Administered 2020-04-03: 120 mg via INTRAVENOUS

## 2020-04-03 MED ORDER — SUCCINYLCHOLINE CHLORIDE 200 MG/10ML IV SOSY
PREFILLED_SYRINGE | INTRAVENOUS | Status: DC | PRN
  Administered 2020-04-03: 200 mg via INTRAVENOUS

## 2020-04-03 MED ORDER — OXYCODONE HCL 5 MG PO TABS
5.0000 mg | ORAL_TABLET | ORAL | Status: DC | PRN
  Administered 2020-04-04 – 2020-04-05 (×4): 5 mg via ORAL
  Administered 2020-04-05: 10 mg via ORAL
  Administered 2020-04-06: 5 mg via ORAL
  Filled 2020-04-03: qty 1
  Filled 2020-04-03: qty 2
  Filled 2020-04-03 (×4): qty 1

## 2020-04-03 MED ORDER — ONDANSETRON HCL 4 MG/2ML IJ SOLN
INTRAMUSCULAR | Status: AC
  Filled 2020-04-03: qty 2

## 2020-04-03 MED ORDER — INSULIN ASPART 100 UNIT/ML ~~LOC~~ SOLN: 0.0000 [IU] | Freq: Every day | SUBCUTANEOUS | Status: DC

## 2020-04-03 MED ORDER — SODIUM CHLORIDE 0.9 % IR SOLN
Status: DC | PRN
  Administered 2020-04-03: 9000 mL

## 2020-04-03 MED ORDER — CHLORHEXIDINE GLUCONATE CLOTH 2 % EX PADS
6.0000 | MEDICATED_PAD | Freq: Every day | CUTANEOUS | Status: DC
  Administered 2020-04-03: 6 via TOPICAL

## 2020-04-03 MED ORDER — PROPOFOL 10 MG/ML IV BOLUS
INTRAVENOUS | Status: AC
  Filled 2020-04-03: qty 20

## 2020-04-03 MED ORDER — CEFAZOLIN SODIUM-DEXTROSE 2-4 GM/100ML-% IV SOLN
2.0000 g | Freq: Once | INTRAVENOUS | Status: AC
  Administered 2020-04-03: 5 g via INTRAVENOUS

## 2020-04-03 MED ORDER — INSULIN GLARGINE 100 UNIT/ML ~~LOC~~ SOLN
10.0000 [IU] | Freq: Every day | SUBCUTANEOUS | Status: DC
  Administered 2020-04-03 – 2020-04-06 (×6): 10 [IU] via SUBCUTANEOUS
  Filled 2020-04-03 (×7): qty 0.1

## 2020-04-03 MED ORDER — PROCHLORPERAZINE EDISYLATE 10 MG/2ML IJ SOLN
10.0000 mg | Freq: Four times a day (QID) | INTRAMUSCULAR | Status: DC | PRN
  Administered 2020-04-03: 10 mg via INTRAVENOUS
  Filled 2020-04-03: qty 2

## 2020-04-03 MED ORDER — CEFAZOLIN SODIUM-DEXTROSE 2-4 GM/100ML-% IV SOLN
INTRAVENOUS | Status: AC
  Filled 2020-04-03: qty 100

## 2020-04-03 MED ORDER — FENTANYL CITRATE (PF) 100 MCG/2ML IJ SOLN
INTRAMUSCULAR | Status: DC | PRN
Start: 2020-04-03 — End: 2020-04-03
  Administered 2020-04-03 (×2): 25 ug via INTRAVENOUS
  Administered 2020-04-03: 50 ug via INTRAVENOUS

## 2020-04-03 MED ORDER — LACTATED RINGERS IV SOLN: INTRAVENOUS | Status: DC

## 2020-04-03 MED ORDER — DOCUSATE SODIUM 100 MG PO CAPS
100.0000 mg | ORAL_CAPSULE | Freq: Two times a day (BID) | ORAL | Status: DC
  Administered 2020-04-03 – 2020-04-05 (×3): 100 mg via ORAL
  Filled 2020-04-03 (×3): qty 1

## 2020-04-03 MED ORDER — FENTANYL CITRATE (PF) 100 MCG/2ML IJ SOLN
25.0000 ug | INTRAMUSCULAR | Status: DC | PRN
  Administered 2020-04-03 (×2): 50 ug via INTRAVENOUS

## 2020-04-03 MED ORDER — PROSOURCE PLUS PO LIQD
30.0000 mL | Freq: Two times a day (BID) | ORAL | Status: DC
  Administered 2020-04-03 – 2020-04-07 (×8): 30 mL via ORAL
  Filled 2020-04-03 (×9): qty 30

## 2020-04-03 MED ORDER — ONDANSETRON HCL 4 MG/2ML IJ SOLN
4.0000 mg | Freq: Four times a day (QID) | INTRAMUSCULAR | Status: DC | PRN
  Filled 2020-04-03: qty 2

## 2020-04-03 MED ORDER — ORAL CARE MOUTH RINSE
15.0000 mL | Freq: Two times a day (BID) | OROMUCOSAL | Status: DC
  Administered 2020-04-03 – 2020-04-07 (×8): 15 mL via OROMUCOSAL

## 2020-04-03 MED ORDER — PHENYLEPHRINE 40 MCG/ML (10ML) SYRINGE FOR IV PUSH (FOR BLOOD PRESSURE SUPPORT)
PREFILLED_SYRINGE | INTRAVENOUS | Status: DC | PRN
  Administered 2020-04-03: 160 ug via INTRAVENOUS
  Administered 2020-04-03: 80 ug via INTRAVENOUS

## 2020-04-03 MED ORDER — METFORMIN HCL ER 500 MG PO TB24
500.0000 mg | ORAL_TABLET | Freq: Every day | ORAL | Status: DC
  Administered 2020-04-03 – 2020-04-07 (×5): 500 mg via ORAL
  Filled 2020-04-03 (×6): qty 1

## 2020-04-03 MED ORDER — VANCOMYCIN HCL 1500 MG/300ML IV SOLN
1500.0000 mg | INTRAVENOUS | Status: DC
  Administered 2020-04-03 – 2020-04-04 (×2): 1500 mg via INTRAVENOUS
  Filled 2020-04-03 (×4): qty 300

## 2020-04-03 MED ORDER — ONDANSETRON HCL 4 MG/2ML IJ SOLN
4.0000 mg | Freq: Three times a day (TID) | INTRAMUSCULAR | Status: DC | PRN
  Administered 2020-04-03 (×2): 4 mg via INTRAVENOUS
  Filled 2020-04-03 (×2): qty 2

## 2020-04-03 MED ORDER — LIDOCAINE 2% (20 MG/ML) 5 ML SYRINGE
INTRAMUSCULAR | Status: DC | PRN
  Administered 2020-04-03: 60 mg via INTRAVENOUS

## 2020-04-03 MED ORDER — DEXAMETHASONE SODIUM PHOSPHATE 10 MG/ML IJ SOLN
INTRAMUSCULAR | Status: AC
  Filled 2020-04-03: qty 1

## 2020-04-03 MED ORDER — LACTATED RINGERS IV BOLUS
20.0000 mL/kg | Freq: Once | INTRAVENOUS | Status: AC
  Administered 2020-04-03: 1724 mL via INTRAVENOUS

## 2020-04-03 MED ORDER — SIMVASTATIN 40 MG PO TABS
40.0000 mg | ORAL_TABLET | Freq: Every day | ORAL | Status: DC
  Administered 2020-04-03 – 2020-04-07 (×5): 40 mg via ORAL
  Filled 2020-04-03 (×5): qty 1

## 2020-04-03 MED ORDER — METOCLOPRAMIDE HCL 5 MG PO TABS: 5.0000 mg | ORAL_TABLET | Freq: Three times a day (TID) | ORAL | Status: DC | PRN

## 2020-04-03 MED ORDER — LIDOCAINE HCL (PF) 1 % IJ SOLN
INTRAMUSCULAR | Status: AC
  Filled 2020-04-03: qty 5

## 2020-04-03 MED ORDER — DEXAMETHASONE SODIUM PHOSPHATE 4 MG/ML IJ SOLN
INTRAMUSCULAR | Status: DC | PRN
  Administered 2020-04-03: 5 mg via INTRAVENOUS

## 2020-04-03 MED ORDER — ADULT MULTIVITAMIN W/MINERALS CH
1.0000 | ORAL_TABLET | Freq: Every day | ORAL | Status: DC
  Administered 2020-04-03 – 2020-04-07 (×5): 1 via ORAL
  Filled 2020-04-03 (×5): qty 1

## 2020-04-03 MED ORDER — ROCURONIUM BROMIDE 10 MG/ML (PF) SYRINGE
PREFILLED_SYRINGE | INTRAVENOUS | Status: DC | PRN
  Administered 2020-04-03: 10 mg via INTRAVENOUS

## 2020-04-03 MED ORDER — ONDANSETRON HCL 4 MG PO TABS: 4.0000 mg | ORAL_TABLET | Freq: Four times a day (QID) | ORAL | Status: DC | PRN

## 2020-04-03 MED ORDER — INSULIN ASPART 100 UNIT/ML ~~LOC~~ SOLN
0.0000 [IU] | Freq: Three times a day (TID) | SUBCUTANEOUS | Status: DC
  Administered 2020-04-03 (×3): 2 [IU] via SUBCUTANEOUS
  Administered 2020-04-04 – 2020-04-05 (×4): 1 [IU] via SUBCUTANEOUS
  Administered 2020-04-05: 2 [IU] via SUBCUTANEOUS
  Administered 2020-04-06 – 2020-04-07 (×3): 1 [IU] via SUBCUTANEOUS

## 2020-04-03 MED ORDER — ACETAMINOPHEN 500 MG PO TABS
1000.0000 mg | ORAL_TABLET | Freq: Four times a day (QID) | ORAL | Status: DC
  Administered 2020-04-03 – 2020-04-07 (×16): 1000 mg via ORAL
  Filled 2020-04-03 (×16): qty 2

## 2020-04-03 MED ORDER — METOCLOPRAMIDE HCL 5 MG/ML IJ SOLN: 5.0000 mg | Freq: Three times a day (TID) | INTRAMUSCULAR | Status: DC | PRN

## 2020-04-03 MED ORDER — MORPHINE SULFATE (PF) 2 MG/ML IV SOLN
2.0000 mg | INTRAVENOUS | Status: DC | PRN
  Administered 2020-04-03 – 2020-04-04 (×2): 2 mg via INTRAVENOUS
  Filled 2020-04-03 (×3): qty 1

## 2020-04-03 SURGICAL SUPPLY — 20 items
BLADE SHAVER TORPEDO 4X13 (MISCELLANEOUS) ×1 IMPLANT
BLADE SURG SZ11 CARB STEEL (BLADE) ×1 IMPLANT
BNDG ELASTIC 6X5.8 VLCR STR LF (GAUZE/BANDAGES/DRESSINGS) IMPLANT
COVER SURGICAL LIGHT HANDLE (MISCELLANEOUS) ×2 IMPLANT
COVER WAND RF STERILE (DRAPES) IMPLANT
DISSECTOR 3.5MM X 13CM CVD (MISCELLANEOUS) ×1 IMPLANT
DRAPE ARTHROSCOPY W/POUCH 114 (DRAPES) ×1 IMPLANT
DRSG PAD ABDOMINAL 8X10 ST (GAUZE/BANDAGES/DRESSINGS) IMPLANT
GLOVE BIOGEL PI IND STRL 8 (GLOVE) ×1 IMPLANT
GLOVE BIOGEL PI INDICATOR 8 (GLOVE) ×6
GOWN STRL REUS W/TWL XL LVL3 (GOWN DISPOSABLE) ×4 IMPLANT
KIT BASIN OR (CUSTOM PROCEDURE TRAY) ×1 IMPLANT
KIT TURNOVER KIT A (KITS) ×1 IMPLANT
MANIFOLD NEPTUNE II (INSTRUMENTS) ×2 IMPLANT
PACK ARTHROSCOPY WL (CUSTOM PROCEDURE TRAY) ×2 IMPLANT
PENCIL SMOKE EVACUATOR (MISCELLANEOUS) IMPLANT
PROTECTOR NERVE ULNAR (MISCELLANEOUS) ×1 IMPLANT
SUT ETHILON 3 0 PS 1 (SUTURE) ×1 IMPLANT
TOWEL OR 17X26 10 PK STRL BLUE (TOWEL DISPOSABLE) ×1 IMPLANT
TUBING ARTHROSCOPY IRRIG 16FT (MISCELLANEOUS) ×2 IMPLANT

## 2020-04-03 NOTE — Anesthesia Preprocedure Evaluation (Addendum)
Anesthesia Evaluation  Patient identified by MRN, date of birth, ID band Patient awake    Reviewed: Allergy & Precautions, NPO status , Patient's Chart, lab work & pertinent test results  Airway Mallampati: II  TM Distance: >3 FB Neck ROM: Full    Dental no notable dental hx. (+) Chipped, Dental Advisory Given,    Pulmonary former smoker,    Pulmonary exam normal breath sounds clear to auscultation       Cardiovascular hypertension, Pt. on home beta blockers and Pt. on medications + angina + CAD, + Past MI, + Cardiac Stents (2019) and + CABG (x3 2007)  Normal cardiovascular exam+ pacemaker (for SSS)  Rhythm:Regular Rate:Normal  HLD  TTE 2018 - Left ventricle: The cavity size was normal. Systolic function was mildly reduced. The estimated ejection fraction was in the rangeof 45% to 50%. Severe hypokinesis of the entireinferolateral and inferior myocardium. Left ventricular diastolic function parameters were normal.  - Mitral valve: Calcified annulus.  - Left atrium: The atrium was mildly dilated.  - Atrial septum: No defect or patent foramen ovale was identified.  - Tricuspid valve: There was mild-moderate regurgitation.  - Pulmonary arteries: PA peak pressure: 31 mm Hg (S).   LHC 2019 Coronary angioplasty: 11/29/2017: Successful  CSI atherectomy of the ostial and proximal circumflex coronary artery followed by stenting with 2.5 x 15 mm resolute Onyx DES.  Stenosis reduced from 99% to 0% with maintenance of TIMI-3 to TIMI-3 flow.  Recommendation: Patient will be continued on aspirin and Brilinta, I will discontinue Xarelto 2.5 mg that was started previously.  Discharge home in the morning.  155 mL contrast utilized.    Neuro/Psych negative neurological ROS  negative psych ROS   GI/Hepatic negative GI ROS, Neg liver ROS,   Endo/Other  diabetes, Oral Hypoglycemic Agents  Renal/GU negative Renal ROS  negative genitourinary    Musculoskeletal negative musculoskeletal ROS (+)   Abdominal   Peds  Hematology  (+) Blood dyscrasia (on xarelto, last dose 9/7), ,   Anesthesia Other Findings   Reproductive/Obstetrics                           Anesthesia Physical Anesthesia Plan  ASA: III and emergent  Anesthesia Plan: General   Post-op Pain Management:    Induction: Intravenous, Rapid sequence and Cricoid pressure planned  PONV Risk Score and Plan: 2 and Dexamethasone, Ondansetron and Treatment may vary due to age or medical condition  Airway Management Planned: Oral ETT  Additional Equipment:   Intra-op Plan:   Post-operative Plan: Extubation in OR  Informed Consent: I have reviewed the patients History and Physical, chart, labs and discussed the procedure including the risks, benefits and alternatives for the proposed anesthesia with the patient or authorized representative who has indicated his/her understanding and acceptance.   Patient has DNR.  Discussed DNR with patient and Suspend DNR.   Dental advisory given  Plan Discussed with: CRNA  Anesthesia Plan Comments:      Anesthesia Quick Evaluation

## 2020-04-03 NOTE — Progress Notes (Signed)
Pharmacy Antibiotic Note  Jesse [name] is a 79 y.o. male admitted on 04/02/2020 with septic arthritis.  Pharmacy has been consulted for Vancomycin dosing.  Plan: Vancomycin 1500mg  q24 (est AUC 478, est Cmax 35/Cmin 10) Daily BMET ordered, last 9/13   Height: 6\' 2"  (188 cm) Weight: 82.8 kg (182 lb 8.7 oz) IBW/kg (Calculated) : 82.2  Temp (24hrs), Avg:97.6 F (36.4 C), Min:97.5 F (36.4 C), Max:97.9 F (36.6 C)  Recent Labs  Lab 04/02/20 1141 04/02/20 1728 04/02/20 2101 04/03/20 0111 04/03/20 0436  WBC 9.0  --   --   --   --   CREATININE 1.42* 1.46* 1.29* 1.11 1.18    Estimated Creatinine Clearance: 58.1 mL/min (by C-G formula based on SCr of 1.18 mg/dL).    Allergies  Allergen Reactions  . Brilinta [Ticagrelor] Shortness Of Breath    Difficulty breathing    Antimicrobials this admission: 9/9 Vanc >>   Dose adjustments this admission:  Microbiology results: 9/9 L synovial fluid Cx: sent 9/9 MRSA PCR: neg   Thank you for allowing pharmacy to be a part of this patient's care.  Minda Ditto PharmD [facility] [phone] 04/03/2020 1:52 PM

## 2020-04-03 NOTE — Anesthesia Postprocedure Evaluation (Signed)
Anesthesia Post Note  Patient: Jesse [name]  Procedure(s) Performed: LEFT KNEE ARTHROSCOPY  WASHOUT (Left Knee)     Patient location during evaluation: PACU Anesthesia Type: General Level of consciousness: awake and alert Pain management: pain level controlled Vital Signs Assessment: post-procedure vital signs reviewed and stable Respiratory status: spontaneous breathing, nonlabored ventilation, respiratory function stable and patient connected to nasal cannula oxygen Cardiovascular status: blood pressure returned to baseline and stable Postop Assessment: no apparent nausea or vomiting Anesthetic complications: no   No complications documented.  Last Vitals:  Vitals:   04/03/20 1530 04/03/20 1545  BP: (!) 144/65 (!) 147/63  Pulse: 66 68  Resp: 19 10  Temp:  36.8 C  SpO2: 96% 100%    Last Pain:  Vitals:   04/03/20 1545  TempSrc:   PainSc: Asleep                 Asuka Dusseau L [name]

## 2020-04-03 NOTE — Interval H&P Note (Signed)
History and Physical Interval Note:  04/03/2020 1:44 PM  Jesse [name]  has presented today for surgery, with the diagnosis of left septic knee.  The various methods of treatment have been discussed with the patient and family. After consideration of risks, benefits and other options for treatment, the patient has consented to  Procedure(s): LEFT KNEE ARTHROSCOPY  WASHOUT (Left) as a surgical intervention.  The patient's history has been reviewed, patient examined, no change in status, stable for surgery.  I have reviewed the patient's chart and labs.  Questions were answered to the patient's satisfaction.  I believe this needs to be done emergently to try and prevent further damage to his knee from the infection.   [name]

## 2020-04-03 NOTE — Op Note (Signed)
Procedure(s): LEFT KNEE ARTHROSCOPY  WASHOUT Procedure Note  Jesse [name] male 79 y.o. 04/03/2020  Preoperative diagnosis: Left septic knee arthritis  Postoperative diagnosis: Same  Procedure(s) and Anesthesia Type:    * LEFT KNEE arthroscopic irrigation debridement- General  Surgeon(s) and Role:    Tania Ade, MD - Primary     Surgeon: [name]   Assistants: Jeanmarie Hubert PA-C [facility] was present and scrubbed throughout the procedure and was essential in positioning, assisting with the camera and instrumentation,, and closure)  Anesthesia: Per anesthesia     Procedure Detail  LEFT KNEE ARTHROSCOPY  WASHOUT  Estimated Blood Loss: Min         Drains: none  Blood Given: none         Specimens: none        Complications:  * No complications entered in OR log *         Disposition: PACU - hemodynamically stable.         Condition: stable    Procedure:   INDICATIONS FOR SURGERY: The patient is 79 y.o. male who has had several week history of left knee pain and swelling.  There was an attempted aspiration in the emergency department which was unsuccessful.  He ended up coming back into the emergency department with diabetic ketoacidosis.  I was consulted to aspirate the knee this morning which showed gross purulence.  The cellular count was consistent with infection and he was indicated for arthroscopic irrigation debridement to eradicate the infection.   DESCRIPTION OF PROCEDURE: The patient was identified in preoperative  holding area where I personally marked the operative site after  verifying site, side, and procedure with the patient.  The patient was taken back to the operating room where general anesthesia was induced without complication and was placed in the supine position with the operative leg placed in a padded leg holder. The foot of the bed was dropped. The opposite extremity was well padded.  The left lower extremity was then  prepped and  draped in a standard sterile fashion. The appropriate time-out  procedure was carried out. The patient did receive IV antibiotics  within 30 minutes of incision.   A small stab incision was made in the anterolateral portal position. The arthroscope was introduced in the joint. A medial portal was then established under direct visualization just above the anterior horn of the medial meniscus.   There was noted to be gross purulence in the joint as well as phlegmonous material.  I was able to visualize all compartments of the joint arthroscopically and used the shaver to debride this material away.  Once adequate debridement was carried out the arthroscope was placed in the suprapatellar pouch and a cannula was placed in the medial portal.  Several liters of fluid were then run through the joint flushing the joint completely.  A medium Hemovac drain was then placed out the lateral proximal knee joint percutaneously.  This was sewn in place with 3-0 nylon.  The arthroscopic equipment was removed from the joint and the portals were closed with 3-0 nylon in an interrupted fashion. Sterile dressings were then applied including Xeroform 4 x 4's ABDs an ACE bandage.  The patient was then allowed to awaken from general anesthesia, transferred to the stretcher and taken to the recovery room in stable condition.   POSTOPERATIVE PLAN: The patient will be admitted back to the hospitalist service.  He will keep the drain in place for 2 or 3  days and then we will pull it.  He can be weightbearing as tolerated.

## 2020-04-03 NOTE — Progress Notes (Signed)
PROGRESS NOTE    Jesse [name]  TDD:[id number] DOB: [birth year] DOA: 04/02/2020 PCP: Deland Pretty, MD  Brief Narrative:  79 year old white male CAD three-vessel CABG DES 11/29/2017 Dr. 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 Diabetes mellitus nuclear stress showed EF 45-50% 02/06/2020  Came to emergency room 03/25/2020 painful knee swelling persist with movement-underwent arthrocentesis no fluid could be aspirated felt this was a hematoma CT was performed showing no fracture-Home health services ordered patient was given some narcotics on discharge and told to follow-up with orthopedic doctor Erlinda Hong of Ortho who he has not seen yet He was given a knee immobilizer  Patient was then sent home and he was not given any specific steroids or anything to account for his hyperglycemia  Returns to ED 9/8 with multiple episodes of vomiting inability to eat for the past several days He states that sugars are usually well controlled in the 200 range found to be in DKA with AG=22, CO2 15 Potassium 5.0, sod 132  EKG benign showing paced rhythm, 2 view chest x-ray negative    Assessment & Plan:   Active Problems:   DKA (diabetic ketoacidoses) ([place])   1. DKA query cause a. Gap closed currently on sliding scale in addition to 10 units of subcu Lantus that was given for transition b. Unclear if #2 as cause-see below 2. ? Septic knee a. Patient had 45 cc purulent fluid aspirated by Dr. Tamera Punt orthopedics b. Is like plan is knee washout once Gram stain/initial cultures back? c. Patient is n.p.o. for procedure would transition to very sensitive sliding scale 3. CAD status post three-vessel CABG in addition to last DES 11/29/2017 Dr. Einar Gip a. Most meds resumed-consider readdition of losartan once all procedures are performed 4. 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 On metoprolol, continue  Xarelto at this time b. I will text Dr. Tamera Punt to ensure that we are okay to continue Xarelto if the procedure is going to be done 5. 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 Resumed Metformin 500 at this time b. Probably will need insulin on discharge at least Lantus 8 to 10 units 6. 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 DC all fluids b. Will need outpatient follow-up with Dr. Einar Gip  DVT prophylaxis: Xarelto Code Status: Full Family Communication:  Disposition:   Status is: Inpatient  Remains inpatient appropriate because:Ongoing active pain requiring inpatient pain management and Ongoing diagnostic testing needed not appropriate for outpatient work up   Dispo: The patient is from: Home              Anticipated d/c is to: SNF              Anticipated d/c date is: > 3 days              Patient currently is not medically stable to d/c.       Consultants:   Dr. Tamera Punt of orthopedics  Procedures: Aspiration of knee 9/9  Antimicrobials: None currently   Subjective: Feels well moving knee without as much pain as prior Tells me a surgery is planned  Objective: Vitals:   04/03/20 0000 04/03/20 0200 04/03/20 0400 04/03/20 0600  BP: (!) 155/64 (!) 145/57 (!) 158/68 (!) 135/53  Pulse: 85 74 86 81  Resp: (!) 23 (!) 23 (!) 26 (!) 25  Temp: (!) 97.5 F (36.4 C)  97.6 F (36.4  C)   TempSrc: Oral  Oral   SpO2: 97% 100% 97% 96%  Weight:      Height:        Intake/Output Summary (Last 24 hours) at 04/03/2020 0703 Last data filed at 04/03/2020 0615 Gross per 24 hour  Intake 4108.72 ml  Output 300 ml  Net 3808.72 ml   Filed Weights   04/02/20 0757 04/02/20 2100  Weight: 86.2 kg 82.8 kg    Examination: Awake coherent no distress EOMI NCAT neck soft supple  General exam: Awake coherent EOMI NCAT neck soft supple Respiratory system: Clear no added sound no rales no rhonchi Cardiovascular system: S1-S2 no murmur sinus  rhythm Gastrointestinal system: Soft nontender no rebound. Central nervous system: Intact motor intact sensory intact Extremities: Swelling of left knee is diminished patient has a bandage on the lateral aspect Skin: Slightly red swollen knee Psychiatry: Euthymic and congruent  Data Reviewed: I have personally reviewed following labs and imaging studies  Sodium currently 135 CO2 26 up from 15 BUNs/creatinine 27/1.4 on admit-->33/1.1 LFT 64/78-->37/59 Bilirubin 3.9-->2.1   Radiology Studies: DG Chest 2 View  Result Date: 04/02/2020 CLINICAL DATA:  Nausea, vomiting EXAM: CHEST - 2 VIEW COMPARISON:  01/18/2020 FINDINGS: Left pacer remains in place, unchanged. Heart is normal size. No confluent opacities or effusions. No acute bony abnormality. IMPRESSION: No active cardiopulmonary disease. Electronically Signed   By: Rolm Baptise M.D.   On: 04/02/2020 20:42     Scheduled Meds: . Chlorhexidine Gluconate Cloth  6 each Topical Daily  . dorzolamide-timolol  1 drop Both Eyes BID  . insulin aspart  0-5 Units Subcutaneous QHS  . insulin aspart  0-9 Units Subcutaneous TID WC  . insulin glargine  10 Units Subcutaneous QHS  . latanoprost  1 drop Both Eyes QHS  . mouth rinse  15 mL Mouth Rinse BID  . metoprolol tartrate  25 mg Oral BID  . rivaroxaban  20 mg Oral Q supper   Continuous Infusions: . dextrose 5% lactated ringers    . dextrose 5% lactated ringers Stopped (04/03/20 9741)  . lactated ringers    . lactated ringers    . lactated ringers Stopped (04/02/20 2103)  . lactated ringers 100 mL/hr at 04/03/20 0615     LOS: 1 day    Time spent: [place], MD [facility] To contact the attending provider between 7A-7P or the covering provider during after hours 7P-7A, please log into the web site [email or web address] and access using universal [place] password for that web site. If you do not have the password, please call the hospital operator.  04/03/2020, 7:03 AM

## 2020-04-03 NOTE — Addendum Note (Signed)
Addendum  created 04/03/20 1631 by Freddrick March, MD   Clinical Note Signed

## 2020-04-03 NOTE — Transfer of Care (Signed)
Immediate Anesthesia Transfer of Care Note  Patient: Jesse [name]  Procedure(s) Performed: LEFT KNEE ARTHROSCOPY  WASHOUT (Left Knee)  Patient Location: PACU  Anesthesia Type:General  Level of Consciousness: drowsy and patient cooperative  Airway & Oxygen Therapy: Patient Spontanous Breathing and Patient connected to face mask  Post-op Assessment: Report given to RN and Post -op Vital signs reviewed and stable  Post vital signs: Reviewed and stable  Last Vitals:  Vitals Value Taken Time  BP 147/69 04/03/20 1513  Temp    Pulse 68 04/03/20 1515  Resp 19 04/03/20 1515  SpO2 100 % 04/03/20 1515  Vitals shown include unvalidated device data.  Last Pain:  Vitals:   04/03/20 1315  TempSrc: Oral  PainSc:          Complications: No complications documented.

## 2020-04-03 NOTE — Hospital Course (Signed)
[place]

## 2020-04-03 NOTE — Anesthesia Procedure Notes (Signed)
Procedure Name: Intubation Date/Time: 04/03/2020 2:30 PM Performed by: Claudia Desanctis, CRNA Pre-anesthesia Checklist: Patient identified, Emergency Drugs available, Suction available and Patient being monitored Patient Re-evaluated:Patient Re-evaluated prior to induction Oxygen Delivery Method: Circle system utilized Preoxygenation: Pre-oxygenation with 100% oxygen Induction Type: IV induction and Rapid sequence Laryngoscope Size: 2 and Miller Grade View: Grade I Tube type: Oral Tube size: 7.5 mm Number of attempts: 1 Airway Equipment and Method: Stylet Placement Confirmation: ETT inserted through vocal cords under direct vision,  positive ETCO2 and breath sounds checked- equal and bilateral Secured at: 22 cm Tube secured with: Tape Dental Injury: Teeth and Oropharynx as per pre-operative assessment

## 2020-04-03 NOTE — Progress Notes (Signed)
Initial Nutrition Assessment  DOCUMENTATION CODES:   Not applicable  INTERVENTION:  - will order 30 mL Prosource Plus BID, each supplement provides 100 kcal and 15 grams of protein. - will order 1 tablet multivitamin with minerals.  NUTRITION DIAGNOSIS:   Increased nutrient needs related to acute illness as evidenced by estimated needs.  GOAL:   Patient will meet greater than or equal to 90% of their needs  MONITOR:   PO intake, Supplement acceptance, Labs, Weight trends  REASON FOR ASSESSMENT:   Malnutrition Screening Tool  ASSESSMENT:   79 year old male with medical history of CAD s/p CABG x3, stage 2-3 CKD, HTN, normocytic anemia, sick sinus syndrome, and DM. He presented to the ED on 8/31 with persistent knee pain and swelling; underweight arthrocentesis but no fluid could be aspirated. He was discharged home and encouraged to see Ortho as an outpatient. He returned to the ED on 9/8 due to multiple episodes of vomiting and inability to eat for several days. He was found to be in DKA.  Patient laying in bed at this time with no family/visitors present. He started to eat breakfast but then was told by Orthopedic MD to stop eating and to be NPO in anticipation of OR for L septic knee.   Patient reports UBW of 198 lb and that he intermittently weighs himself at home. He is unsure of the last time he weighed this, but knows it was within this year. He had an outpatient doctor's appointment an unknown amount of time ago and was down to 189-188 lb. Patient was happy about this weight loss and that it seemed to help with glycemic control. Weight yesterday was 182 lb.   He checks CBGs once in the morning and once in the evening, one hour after eating. Reports that until 3 weeks ago CBGs were well controlled, often around 90 mg/dl. About 2 weeks ago he began to have intermittent N/V which he is unsure if it was related to, exacerbated by, or onset by anything in particular. This did  impact his appetite and desire to eat. No abdominal pain.  He does most of the chores around the home as his wife has some health and mobility issues.     Labs reviewed; CBGs: 122-173 mg/dl, BUN: 33 mg/dl, GFR: 58 ml/min. Medications reviewed; sliding scale novolog, 10 units lantus/day, 500 mg glucophage/day, 10 mEq IV KCl x2 runs 9/8. IVF; LR @ 100 ml/hr.    NUTRITION - FOCUSED PHYSICAL EXAM:    Most Recent Value  Orbital Region No depletion  Upper Arm Region Mild depletion  Thoracic and Lumbar Region No depletion  Buccal Region No depletion  Temple Region No depletion  Clavicle Bone Region Mild depletion  Clavicle and Acromion Bone Region No depletion  Scapular Bone Region Unable to assess  Dorsal Hand No depletion  Patellar Region No depletion  Anterior Thigh Region Unable to assess  Posterior Calf Region No depletion  Edema (RD Assessment) Mild  [LLE]  Hair Reviewed  Eyes Reviewed  Mouth Reviewed  Skin Reviewed  Nails Reviewed       Diet Order:   Diet Order            Diet NPO time specified  Diet effective now                 EDUCATION NEEDS:   No education needs have been identified at this time  Skin:  Skin Assessment: Skin Integrity Issues: Skin Integrity Issues:: Stage I Stage I:  mid buttocks  Last BM:  9/8 (patient report)  Height:   Ht Readings from Last 1 Encounters:  04/02/20 6\' 2"  (1.88 m)    Weight:   Wt Readings from Last 1 Encounters:  04/02/20 82.8 kg    Estimated Nutritional Needs:  Kcal:  0221-7981 kcal Protein:  95-105 grams Fluid:  >/= 2.2 L/day     Jarome Matin, MS, RD, LDN, CNSC Inpatient Clinical Dietitian RD pager # available in [facility]  After hours/weekend pager # available in [facility]

## 2020-04-03 NOTE — Consult Note (Signed)
Reason for Consult:L knee pain/ effusion Referring Physician: Rithvik Orcutt is an 79 y.o. male.  HPI: 4-6 wk history of worsening knee pain and swelling.  Admitted for DKA, c/o sepsis. Not currently febrile and WBC not elevated at 9.  S/p one attempt in ED to aspirate without success.   Past Medical History:  Diagnosis Date  . Angina pectoris ([place]) 02/03/2015  . CAD (coronary artery disease), native coronary artery 08/30/2018   2007 [place], [place]: LIMA to LAD, Occluded SVG to RCA and OM by angiogram S/P  PCI/orbital atherectomy S/P 2.5x15 Resoluute to prox Cx on 11/29/2017.  [place] Kitchen Coronary artery disease   . High cholesterol   . Hypertension   . Myocardial infarction [facility]) 2017/2018?  . NSTEMI (non-ST elevated myocardial infarction) ([place]) 02/03/2015  . Presence of permanent cardiac pacemaker   . Type II diabetes mellitus ([place])     Past Surgical History:  Procedure Laterality Date  . CARDIAC CATHETERIZATION N/A 02/04/2015   Procedure: Left Heart Cath and Cors/Grafts Angiography;  Surgeon: Adrian Prows, MD;  Location: [place] [facility];  Service: Cardiovascular;  Laterality: N/A;  . CARDIAC CATHETERIZATION N/A 02/04/2015   Procedure: Coronary Stent Intervention;  Surgeon: Adrian Prows, MD;  Location: [place] [facility];  Service: Cardiovascular;  Laterality: N/A;  . CORONARY ANGIOGRAPHY N/A 10/18/2017   Procedure: CORONARY ANGIOGRAPHY;  Surgeon: Adrian Prows, MD;  Location: [place] [facility];  Service: Cardiovascular;  Laterality: N/A;  . CORONARY ANGIOPLASTY    . CORONARY ANGIOPLASTY WITH STENT PLACEMENT     "i've got 5-6 stents total" (08/30/2017)  . CORONARY ARTERY BYPASS GRAFT  2007   CABG X3  . CORONARY ATHERECTOMY N/A 10/18/2017   Procedure: CORONARY ATHERECTOMY;  Surgeon: Adrian Prows, MD;  Location: [place] [facility];  Service: Cardiovascular;  Laterality: N/A;  . CORONARY ATHERECTOMY N/A 11/29/2017   Procedure: CORONARY ATHERECTOMY;  Surgeon: Adrian Prows, MD;  Location: [place] [facility];   Service: Cardiovascular;  Laterality: N/A;  . CORONARY CTO INTERVENTION N/A 08/30/2017   Procedure: CORONARY CTO INTERVENTION;  Surgeon: Adrian Prows, MD;  Location: [place] [facility];  Service: Cardiovascular;  Laterality: N/A;  . CORONARY STENT INTERVENTION N/A 11/29/2017   Procedure: CORONARY STENT INTERVENTION;  Surgeon: Adrian Prows, MD;  Location: [place] [facility];  Service: Cardiovascular;  Laterality: N/A;  . INSERT / REPLACE / REMOVE PACEMAKER  ?2012   "I think it was placed in 2012"  . LEFT HEART CATH AND CORONARY ANGIOGRAPHY N/A 08/30/2017   Procedure: LEFT HEART CATH AND CORONARY ANGIOGRAPHY;  Surgeon: Adrian Prows, MD;  Location: [place] [facility];  Service: Cardiovascular;  Laterality: N/A;  . LEFT HEART CATH AND CORS/GRAFTS ANGIOGRAPHY N/A 08/05/2017   Procedure: LEFT HEART CATH AND CORS/GRAFTS ANGIOGRAPHY;  Surgeon: Adrian Prows, MD;  Location: [place] [facility];  Service: Cardiovascular;  Laterality: N/A;    Family History  Problem Relation Age of Onset  . Lung cancer Mother   . Stroke Father   . Febrile seizures Brother     Social History:  reports that he quit smoking about 61 years ago. His smoking use included cigarettes. He quit after 3.00 years of use. He has never used smokeless tobacco. He reports that he does not drink alcohol and does not use drugs.  Allergies:  Allergies  Allergen Reactions  . Brilinta [Ticagrelor] Shortness Of Breath    Difficulty breathing     Medications: I have reviewed the patient's current medications.  Results  for orders placed or performed during the hospital encounter of 04/02/20 (from the past 48 hour(s))  Lipase, blood     Status: None   Collection Time: 04/02/20 11:41 AM  Result Value Ref Range   Lipase 37 11 - 51 U/L    Comment: Performed at [facility], [place] [address] [place], [place] [address]  Comprehensive metabolic panel     Status: Abnormal   Collection Time: 04/02/20 11:41 AM  Result Value Ref Range    Sodium 132 (L) 135 - 145 mmol/L    Comment: REPEATED TO VERIFY   Potassium 5.0 3.5 - 5.1 mmol/L    Comment: NO VISIBLE HEMOLYSIS   Chloride 95 (L) 98 - 111 mmol/L    Comment: REPEATED TO VERIFY   CO2 15 (L) 22 - 32 mmol/L    Comment: REPEATED TO VERIFY   Glucose, Bld 305 (H) 70 - 99 mg/dL    Comment: Glucose reference range applies only to samples taken after fasting for at least 8 hours.   BUN 27 (H) 8 - 23 mg/dL   Creatinine, Ser 1.42 (H) 0.61 - 1.24 mg/dL   Calcium 9.9 8.9 - 10.3 mg/dL    Comment: REPEATED TO VERIFY   Total Protein 7.2 6.5 - 8.1 g/dL   Albumin 3.0 (L) 3.5 - 5.0 g/dL   AST 64 (H) 15 - 41 U/L   ALT 78 (H) 0 - 44 U/L   Alkaline Phosphatase 127 (H) 38 - 126 U/L   Total Bilirubin 3.9 (H) 0.3 - 1.2 mg/dL   GFR calc non Af Amer 46 (L) >60 mL/min   GFR calc Af Amer 54 (L) >60 mL/min   Anion gap 22 (H) 5 - 15    Comment: Performed at [facility], [place] [address] [place], [place] [address]  CBC     Status: Abnormal   Collection Time: 04/02/20 11:41 AM  Result Value Ref Range   WBC 9.0 4.0 - 10.5 K/uL   RBC 4.42 4.22 - 5.81 MIL/uL   Hemoglobin 14.7 13.0 - 17.0 g/dL   HCT 44.0 39 - 52 %   MCV 99.5 80.0 - 100.0 fL   MCH 33.3 26.0 - 34.0 pg   MCHC 33.4 30.0 - 36.0 g/dL   RDW 11.9 11.5 - 15.5 %   Platelets 455 (H) 150 - 400 K/uL   nRBC 0.0 0.0 - 0.2 %    Comment: Performed at [facility], [place] [address] [place], [place] [address]  Blood gas, venous (at [facility] and [facility], not at [facility])     Status: Abnormal   Collection Time: 04/02/20  3:55 PM  Result Value Ref Range   pH, Ven 7.337 7.25 - 7.43   pCO2, Ven 36.2 (L) 44 - 60 mmHg   pO2, Ven <31.0 (LL) 32 - 45 mmHg    Comment: CRITICAL RESULT CALLED TO, READ BACK BY AND VERIFIED WITH: FRANKLIN,C. RN @1613  04/02/20 BILLINGSLEY,L    Bicarbonate 18.9 (L) 20.0 - 28.0 mmol/L   Acid-base deficit 5.7 (H) 0.0 - 2.0 mmol/L   O2 Saturation 32.7 %   Patient temperature 98.6    Sample type VENOUS      Comment: Performed at [facility], [place] [address], [place], [place] [address]  Beta-hydroxybutyric acid     Status: Abnormal   Collection Time: 04/02/20  3:55 PM  Result Value Ref Range   Beta-Hydroxybutyric Acid 4.12 (H) 0.05 - 0.27 mmol/L  Comment: Performed at [facility], [place] [address], [place], [place] [address]  SARS Coronavirus 2 by RT PCR (hospital order, performed in [facility] hospital lab) Nasopharyngeal Nasopharyngeal Swab     Status: None   Collection Time: 04/02/20  4:03 PM   Specimen: Nasopharyngeal Swab  Result Value Ref Range   SARS Coronavirus 2 NEGATIVE NEGATIVE    Comment: (NOTE) SARS-CoV-2 target nucleic acids are NOT DETECTED.  The SARS-CoV-2 RNA is generally detectable in upper and lower respiratory specimens during the acute phase of infection. The lowest concentration of SARS-CoV-2 viral copies this assay can detect is 250 copies / mL. A negative result does not preclude SARS-CoV-2 infection and should not be used as the sole basis for treatment or other patient management decisions.  A negative result may occur with improper specimen collection / handling, submission of specimen other than nasopharyngeal swab, presence of viral mutation(s) within the areas targeted by this assay, and inadequate number of viral copies (<250 copies / mL). A negative result must be combined with clinical observations, patient history, and epidemiological information.  Fact Sheet for Patients:   StrictlyIdeas.no  Fact Sheet for Healthcare Providers: BankingDealers.co.za  This test is not yet approved or  cleared by the [place] FDA and has been authorized for detection and/or diagnosis of SARS-CoV-2 by FDA under an Emergency Use Authorization (EUA).  This EUA will remain in effect (meaning this test can be used) for the duration of the COVID-19 declaration under Section  564(b)(1) of the Act, 21 U.S.C. section 360bbb-3(b)(1), unless the authorization is terminated or revoked sooner.  Performed at [facility], [place] [address], [place], [place] [address]   Basic metabolic panel     Status: Abnormal   Collection Time: 04/02/20  5:28 PM  Result Value Ref Range   Sodium 134 (L) 135 - 145 mmol/L   Potassium 5.3 (H) 3.5 - 5.1 mmol/L   Chloride 101 98 - 111 mmol/L   CO2 15 (L) 22 - 32 mmol/L   Glucose, Bld 311 (H) 70 - 99 mg/dL    Comment: Glucose reference range applies only to samples taken after fasting for at least 8 hours.   BUN 35 (H) 8 - 23 mg/dL   Creatinine, Ser 1.46 (H) 0.61 - 1.24 mg/dL   Calcium 9.1 8.9 - 10.3 mg/dL   GFR calc non Af Amer 45 (L) >60 mL/min   GFR calc Af Amer 52 (L) >60 mL/min   Anion gap 18 (H) 5 - 15    Comment: Performed at [facility], [place] [address], [place], [place] [address]  Beta-hydroxybutyric acid     Status: Abnormal   Collection Time: 04/02/20  5:28 PM  Result Value Ref Range   Beta-Hydroxybutyric Acid 2.53 (H) 0.05 - 0.27 mmol/L    Comment: Performed at [facility], [place] [address] [place], [place] [address]  Hemoglobin A1c     Status: Abnormal   Collection Time: 04/02/20  5:28 PM  Result Value Ref Range   Hgb A1c MFr Bld 6.4 (H) 4.8 - 5.6 %    Comment: (NOTE) Pre diabetes:          5.7%-6.4%  Diabetes:              >6.4%  Glycemic control for   <7.0% adults with diabetes    Mean Plasma Glucose 136.98 mg/dL    Comment: Performed at [place],  [place] [address]  CBG monitoring, ED     Status: Abnormal   Collection Time: 04/02/20  5:41 PM  Result Value Ref Range   Glucose-Capillary 271 (H) 70 - 99 mg/dL    Comment: Glucose reference range applies only to samples taken after fasting for at least 8 hours.   Comment 1 Notify RN    Comment 2 Document in Chart   CBG monitoring, ED     Status: Abnormal   Collection  Time: 04/02/20  7:21 PM  Result Value Ref Range   Glucose-Capillary 267 (H) 70 - 99 mg/dL    Comment: Glucose reference range applies only to samples taken after fasting for at least 8 hours.  Glucose, capillary     Status: Abnormal   Collection Time: 04/02/20  8:57 PM  Result Value Ref Range   Glucose-Capillary 138 (H) 70 - 99 mg/dL    Comment: Glucose reference range applies only to samples taken after fasting for at least 8 hours.   Comment 1 Notify RN    Comment 2 Document in Chart   Basic metabolic panel     Status: Abnormal   Collection Time: 04/02/20  9:01 PM  Result Value Ref Range   Sodium 135 135 - 145 mmol/L   Potassium 4.6 3.5 - 5.1 mmol/L   Chloride 99 98 - 111 mmol/L   CO2 21 (L) 22 - 32 mmol/L   Glucose, Bld 159 (H) 70 - 99 mg/dL    Comment: Glucose reference range applies only to samples taken after fasting for at least 8 hours.   BUN 35 (H) 8 - 23 mg/dL   Creatinine, Ser 1.29 (H) 0.61 - 1.24 mg/dL   Calcium 9.6 8.9 - 10.3 mg/dL   GFR calc non Af Amer 52 (L) >60 mL/min   GFR calc Af Amer >60 >60 mL/min   Anion gap 15 5 - 15    Comment: Performed at [facility], [place] [address] [place], [place] [address]  Glucose, capillary     Status: Abnormal   Collection Time: 04/02/20 10:00 PM  Result Value Ref Range   Glucose-Capillary 134 (H) 70 - 99 mg/dL    Comment: Glucose reference range applies only to samples taken after fasting for at least 8 hours.   Comment 1 Notify RN    Comment 2 Document in Chart   Glucose, capillary     Status: Abnormal   Collection Time: 04/02/20 11:03 PM  Result Value Ref Range   Glucose-Capillary 153 (H) 70 - 99 mg/dL    Comment: Glucose reference range applies only to samples taken after fasting for at least 8 hours.  Glucose, capillary     Status: Abnormal   Collection Time: 04/03/20 12:02 AM  Result Value Ref Range   Glucose-Capillary 144 (H) 70 - 99 mg/dL    Comment: Glucose reference range applies only to samples  taken after fasting for at least 8 hours.  Glucose, capillary     Status: Abnormal   Collection Time: 04/03/20  1:06 AM  Result Value Ref Range   Glucose-Capillary 167 (H) 70 - 99 mg/dL    Comment: Glucose reference range applies only to samples taken after fasting for at least 8 hours.  Basic metabolic panel     Status: Abnormal   Collection Time: 04/03/20  1:11 AM  Result Value Ref Range   Sodium 133 (L) 135 - 145 mmol/L   Potassium 4.2 3.5 - 5.1 mmol/L   Chloride 100 98 -  111 mmol/L   CO2 24 22 - 32 mmol/L   Glucose, Bld 166 (H) 70 - 99 mg/dL    Comment: Glucose reference range applies only to samples taken after fasting for at least 8 hours.   BUN 34 (H) 8 - 23 mg/dL   Creatinine, Ser 1.11 0.61 - 1.24 mg/dL   Calcium 9.2 8.9 - 10.3 mg/dL   GFR calc non Af Amer >60 >60 mL/min   GFR calc Af Amer >60 >60 mL/min   Anion gap 9 5 - 15    Comment: Performed at [facility], [place] [address] [place], [place] [address]  Beta-hydroxybutyric acid     Status: None   Collection Time: 04/03/20  1:11 AM  Result Value Ref Range   Beta-Hydroxybutyric Acid 0.18 0.05 - 0.27 mmol/L    Comment: Performed at [facility], [place] [address] [place], [place] [address]  Glucose, capillary     Status: Abnormal   Collection Time: 04/03/20  2:04 AM  Result Value Ref Range   Glucose-Capillary 173 (H) 70 - 99 mg/dL    Comment: Glucose reference range applies only to samples taken after fasting for at least 8 hours.   Comment 1 Notify RN    Comment 2 Document in Chart   Glucose, capillary     Status: Abnormal   Collection Time: 04/03/20  3:10 AM  Result Value Ref Range   Glucose-Capillary 152 (H) 70 - 99 mg/dL    Comment: Glucose reference range applies only to samples taken after fasting for at least 8 hours.  Hepatic function panel     Status: Abnormal   Collection Time: 04/03/20  4:36 AM  Result Value Ref Range   Total Protein 6.3 (L) 6.5 - 8.1 g/dL   Albumin 2.5  (L) 3.5 - 5.0 g/dL   AST 37 15 - 41 U/L   ALT 59 (H) 0 - 44 U/L   Alkaline Phosphatase 109 38 - 126 U/L   Total Bilirubin 2.1 (H) 0.3 - 1.2 mg/dL   Bilirubin, Direct 1.1 (H) 0.0 - 0.2 mg/dL   Indirect Bilirubin 1.0 (H) 0.3 - 0.9 mg/dL    Comment: Performed at [facility], [place] [address] [place], [place] [address]  Protime-INR     Status: Abnormal   Collection Time: 04/03/20  4:36 AM  Result Value Ref Range   Prothrombin Time 17.7 (H) 11.4 - 15.2 seconds   INR 1.5 (H) 0.8 - 1.2    Comment: (NOTE) INR goal varies based on device and disease states. Performed at [facility], [place] [address] [place], [place] [address]   Basic metabolic panel     Status: Abnormal   Collection Time: 04/03/20  4:36 AM  Result Value Ref Range   Sodium 135 135 - 145 mmol/L   Potassium 4.1 3.5 - 5.1 mmol/L   Chloride 99 98 - 111 mmol/L   CO2 26 22 - 32 mmol/L   Glucose, Bld 147 (H) 70 - 99 mg/dL    Comment: Glucose reference range applies only to samples taken after fasting for at least 8 hours.   BUN 33 (H) 8 - 23 mg/dL   Creatinine, Ser 1.18 0.61 - 1.24 mg/dL   Calcium 9.2 8.9 - 10.3 mg/dL   GFR calc non Af Amer 58 (L) >60 mL/min   GFR calc Af Amer >60 >60 mL/min   Anion gap 10 5 - 15    Comment: Performed at [facility]  [place] [address] [place], [place] [address]  Glucose, capillary     Status: Abnormal   Collection Time: 04/03/20  5:16 AM  Result Value Ref Range   Glucose-Capillary 168 (H) 70 - 99 mg/dL    Comment: Glucose reference range applies only to samples taken after fasting for at least 8 hours.  Glucose, capillary     Status: Abnormal   Collection Time: 04/03/20  6:14 AM  Result Value Ref Range   Glucose-Capillary 162 (H) 70 - 99 mg/dL    Comment: Glucose reference range applies only to samples taken after fasting for at least 8 hours.  MRSA PCR Screening     Status: None   Collection Time: 04/03/20  6:36 AM   Specimen:  Nasopharyngeal Wash  Result Value Ref Range   MRSA by PCR NEGATIVE NEGATIVE    Comment:        The GeneXpert MRSA Assay (FDA approved for NASAL specimens only), is one component of a comprehensive MRSA colonization surveillance program. It is not intended to diagnose MRSA infection nor to guide or monitor treatment for MRSA infections. Performed at [facility], [place] [address], [place], [place] [address]   Glucose, capillary     Status: Abnormal   Collection Time: 04/03/20  7:20 AM  Result Value Ref Range   Glucose-Capillary 122 (H) 70 - 99 mg/dL    Comment: Glucose reference range applies only to samples taken after fasting for at least 8 hours.   Comment 1 Notify RN    Comment 2 Document in Chart     DG Chest 2 View  Result Date: 04/02/2020 CLINICAL DATA:  Nausea, vomiting EXAM: CHEST - 2 VIEW COMPARISON:  01/18/2020 FINDINGS: Left pacer remains in place, unchanged. Heart is normal size. No confluent opacities or effusions. No acute bony abnormality. IMPRESSION: No active cardiopulmonary disease. Electronically Signed   By: Rolm Baptise M.D.   On: 04/02/2020 20:42    Review of Systems  All other systems reviewed and are negative.  Blood pressure (!) 152/63, pulse 85, temperature 97.9 F (36.6 C), temperature source Oral, resp. rate (!) 26, height 6\' 2"  (1.88 m), weight 82.8 kg, SpO2 97 %. Physical Exam Constitutional:      Appearance: He is well-developed.  HENT:     Head: Atraumatic.  Pulmonary:     Effort: Pulmonary effort is normal.  Musculoskeletal:     Comments: L knee with large effusion, warm to touch, pain with ROM. NVID  Skin:    General: Skin is warm and dry.  Neurological:     Mental Status: He is alert and oriented to person, place, and time.     Assessment/Plan: C/o L knee septic arthritis Aspirated at bedside STAT cx, GS and cell count pending NPO for possible I&D  Procedure: The patient's identity and site of procedure were  verified and verbal consent was obtained.  The left lateral knee was sterilely prepped with alcohol and infiltrated with 1% lidocaine 3 cc with a 25-gauge needle.  After a few minutes the area was again sterilely prepped with alcohol and an 18-gauge needle was used to enter the knee joint and used to aspirate 45 cc of purulent yellow fluid.  The knee was withdrawn and a sterile bandage was applied.  Patient tolerated the procedure well.  The fluid was sent for stat analysis.  [name] 04/03/2020, 9:16 AM

## 2020-04-03 NOTE — TOC Initial Note (Signed)
Transition of Care [facility]) - Initial/Assessment Note    Patient Details  Name: Jesse [name] MRN: [id number] Date of Birth: 1940-09-21  Transition of Care [facility]) CM/SW Contact:    Leeroy Cha, RN Phone Number: 04/03/2020, 7:57 AM  Clinical Narrative:                 dka bld glucose this am is 147 on ssi and metformin. Iv lr at 100cc/hr, awake and responsive. Plan to return to home.  Expected Discharge Plan: Home/Self Care Barriers to Discharge: Continued Medical Work up   Patient Goals and CMS Choice Patient states their goals for this hospitalization and ongoing recovery are:: to go home CMS Medicare.gov Compare Post Acute Care list provided to:: Patient    Expected Discharge Plan and Services Expected Discharge Plan: Home/Self Care   Discharge Planning Services: CM Consult   Living arrangements for the past 2 months: Single Family Home                                      Prior Living Arrangements/Services Living arrangements for the past 2 months: Single Family Home Lives with:: Self Patient language and need for interpreter reviewed:: Yes        Need for Family Participation in Patient Care: Yes (Comment) Care giver support system in place?: Yes (comment)   Criminal Activity/Legal Involvement Pertinent to Current Situation/Hospitalization: No - Comment as needed  Activities of Daily Living Home Assistive Devices/Equipment: Eyeglasses, CBG Meter, Walker (specify type) ADL Screening (condition at time of admission) Patient's cognitive ability adequate to safely complete daily activities?: Yes Is the patient deaf or have difficulty hearing?: Yes Does the patient have difficulty seeing, even when wearing glasses/contacts?: No Does the patient have difficulty concentrating, remembering, or making decisions?: No Patient able to express need for assistance with ADLs?: Yes Does the patient have difficulty dressing or bathing?: No Independently performs  ADLs?: Yes (appropriate for developmental age) Does the patient have difficulty walking or climbing stairs?: Yes Weakness of Legs: Both Weakness of Arms/Hands: Both  Permission Sought/Granted                  Emotional Assessment Appearance:: Appears stated age Attitude/Demeanor/Rapport: Engaged Affect (typically observed): Calm Orientation: : Oriented to Self, Oriented to Place, Oriented to  Time, Oriented to Situation Alcohol / Substance Use: Not Applicable Psych Involvement: No (comment)  Admission diagnosis:  DKA (diabetic ketoacidoses) ([place]) [E11.10] Pneumonia [J18.9] Diabetic ketoacidosis without coma associated with type 2 diabetes mellitus ([place]) [E11.10] Patient Active Problem List   Diagnosis Date Noted  . DKA (diabetic ketoacidoses) ([place]) 04/02/2020  . Encounter for care of pacemaker 03/16/2019  . Coronary artery disease 12/15/2018  . 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 Flu-like symptoms 09/04/2018  . Troponin level elevated 09/02/2018  . CAD (coronary artery disease), native coronary artery 08/30/2018  . Essential hypertension   . Mixed hyperlipidemia   . Angina pectoris ([place]) 02/03/2015  . Post PTCA 02/03/2015  . Diabetes mellitus ([place]) 02/03/2015  . Sick sinus syndrome ([place])   . Pacemaker Medtronic dual chamber adapta [id number] on 11/20/2009 in [place]. 11/20/2009  . 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 [place] [address] Phone: [phone] Fax: [place] #  [place], [place] AT [place] [address] Phone: [phone] Fax: [phone]     Social Determinants of Health (SDOH) Interventions    Readmission Risk Interventions No flowsheet data found.

## 2020-04-03 NOTE — H&P (View-Only) (Signed)
Reason for Consult:L knee pain/ effusion Referring Physician: Hurbert Duran is an 79 y.o. male.  HPI: 4-6 wk history of worsening knee pain and swelling.  Admitted for DKA, c/o sepsis. Not currently febrile and WBC not elevated at 9.  S/p one attempt in ED to aspirate without success.   Past Medical History:  Diagnosis Date  . Angina pectoris ([place]) 02/03/2015  . CAD (coronary artery disease), native coronary artery 08/30/2018   2007 [place], [place]: LIMA to LAD, Occluded SVG to RCA and OM by angiogram S/P  PCI/orbital atherectomy S/P 2.5x15 Resoluute to prox Cx on 11/29/2017.  [place] Kitchen Coronary artery disease   . High cholesterol   . Hypertension   . Myocardial infarction [facility]) 2017/2018?  . NSTEMI (non-ST elevated myocardial infarction) ([place]) 02/03/2015  . Presence of permanent cardiac pacemaker   . Type II diabetes mellitus ([place])     Past Surgical History:  Procedure Laterality Date  . CARDIAC CATHETERIZATION N/A 02/04/2015   Procedure: Left Heart Cath and Cors/Grafts Angiography;  Surgeon: Adrian Prows, MD;  Location: [place] [facility];  Service: Cardiovascular;  Laterality: N/A;  . CARDIAC CATHETERIZATION N/A 02/04/2015   Procedure: Coronary Stent Intervention;  Surgeon: Adrian Prows, MD;  Location: [place] [facility];  Service: Cardiovascular;  Laterality: N/A;  . CORONARY ANGIOGRAPHY N/A 10/18/2017   Procedure: CORONARY ANGIOGRAPHY;  Surgeon: Adrian Prows, MD;  Location: [place] [facility];  Service: Cardiovascular;  Laterality: N/A;  . CORONARY ANGIOPLASTY    . CORONARY ANGIOPLASTY WITH STENT PLACEMENT     "i've got 5-6 stents total" (08/30/2017)  . CORONARY ARTERY BYPASS GRAFT  2007   CABG X3  . CORONARY ATHERECTOMY N/A 10/18/2017   Procedure: CORONARY ATHERECTOMY;  Surgeon: Adrian Prows, MD;  Location: [place] [facility];  Service: Cardiovascular;  Laterality: N/A;  . CORONARY ATHERECTOMY N/A 11/29/2017   Procedure: CORONARY ATHERECTOMY;  Surgeon: Adrian Prows, MD;  Location: [place] [facility];   Service: Cardiovascular;  Laterality: N/A;  . CORONARY CTO INTERVENTION N/A 08/30/2017   Procedure: CORONARY CTO INTERVENTION;  Surgeon: Adrian Prows, MD;  Location: [place] [facility];  Service: Cardiovascular;  Laterality: N/A;  . CORONARY STENT INTERVENTION N/A 11/29/2017   Procedure: CORONARY STENT INTERVENTION;  Surgeon: Adrian Prows, MD;  Location: [place] [facility];  Service: Cardiovascular;  Laterality: N/A;  . INSERT / REPLACE / REMOVE PACEMAKER  ?2012   "I think it was placed in 2012"  . LEFT HEART CATH AND CORONARY ANGIOGRAPHY N/A 08/30/2017   Procedure: LEFT HEART CATH AND CORONARY ANGIOGRAPHY;  Surgeon: Adrian Prows, MD;  Location: [place] [facility];  Service: Cardiovascular;  Laterality: N/A;  . LEFT HEART CATH AND CORS/GRAFTS ANGIOGRAPHY N/A 08/05/2017   Procedure: LEFT HEART CATH AND CORS/GRAFTS ANGIOGRAPHY;  Surgeon: Adrian Prows, MD;  Location: [place] [facility];  Service: Cardiovascular;  Laterality: N/A;    Family History  Problem Relation Age of Onset  . Lung cancer Mother   . Stroke Father   . Febrile seizures Brother     Social History:  reports that he quit smoking about 61 years ago. His smoking use included cigarettes. He quit after 3.00 years of use. He has never used smokeless tobacco. He reports that he does not drink alcohol and does not use drugs.  Allergies:  Allergies  Allergen Reactions  . Brilinta [Ticagrelor] Shortness Of Breath    Difficulty breathing     Medications: I have reviewed the patient's current medications.  Results  for orders placed or performed during the hospital encounter of 04/02/20 (from the past 48 hour(s))  Lipase, blood     Status: None   Collection Time: 04/02/20 11:41 AM  Result Value Ref Range   Lipase 37 11 - 51 U/L    Comment: Performed at [facility], [place] [address], [place], [place] [address]  Comprehensive metabolic panel     Status: Abnormal   Collection Time: 04/02/20 11:41 AM  Result Value Ref Range    Sodium 132 (L) 135 - 145 mmol/L    Comment: REPEATED TO VERIFY   Potassium 5.0 3.5 - 5.1 mmol/L    Comment: NO VISIBLE HEMOLYSIS   Chloride 95 (L) 98 - 111 mmol/L    Comment: REPEATED TO VERIFY   CO2 15 (L) 22 - 32 mmol/L    Comment: REPEATED TO VERIFY   Glucose, Bld 305 (H) 70 - 99 mg/dL    Comment: Glucose reference range applies only to samples taken after fasting for at least 8 hours.   BUN 27 (H) 8 - 23 mg/dL   Creatinine, Ser 1.42 (H) 0.61 - 1.24 mg/dL   Calcium 9.9 8.9 - 10.3 mg/dL    Comment: REPEATED TO VERIFY   Total Protein 7.2 6.5 - 8.1 g/dL   Albumin 3.0 (L) 3.5 - 5.0 g/dL   AST 64 (H) 15 - 41 U/L   ALT 78 (H) 0 - 44 U/L   Alkaline Phosphatase 127 (H) 38 - 126 U/L   Total Bilirubin 3.9 (H) 0.3 - 1.2 mg/dL   GFR calc non Af Amer 46 (L) >60 mL/min   GFR calc Af Amer 54 (L) >60 mL/min   Anion gap 22 (H) 5 - 15    Comment: Performed at [facility], [place] [address], [place], [place] [address]  CBC     Status: Abnormal   Collection Time: 04/02/20 11:41 AM  Result Value Ref Range   WBC 9.0 4.0 - 10.5 K/uL   RBC 4.42 4.22 - 5.81 MIL/uL   Hemoglobin 14.7 13.0 - 17.0 g/dL   HCT 44.0 39 - 52 %   MCV 99.5 80.0 - 100.0 fL   MCH 33.3 26.0 - 34.0 pg   MCHC 33.4 30.0 - 36.0 g/dL   RDW 11.9 11.5 - 15.5 %   Platelets 455 (H) 150 - 400 K/uL   nRBC 0.0 0.0 - 0.2 %    Comment: Performed at [facility], [place] [address] [place], [place] [address]  Blood gas, venous (at [facility] and [facility], not at [facility])     Status: Abnormal   Collection Time: 04/02/20  3:55 PM  Result Value Ref Range   pH, Ven 7.337 7.25 - 7.43   pCO2, Ven 36.2 (L) 44 - 60 mmHg   pO2, Ven <31.0 (LL) 32 - 45 mmHg    Comment: CRITICAL RESULT CALLED TO, READ BACK BY AND VERIFIED WITH: FRANKLIN,C. RN @1613  04/02/20 BILLINGSLEY,L    Bicarbonate 18.9 (L) 20.0 - 28.0 mmol/L   Acid-base deficit 5.7 (H) 0.0 - 2.0 mmol/L   O2 Saturation 32.7 %   Patient temperature 98.6    Sample type VENOUS      Comment: Performed at [facility], [place] [address], [place], [place] [address]  Beta-hydroxybutyric acid     Status: Abnormal   Collection Time: 04/02/20  3:55 PM  Result Value Ref Range   Beta-Hydroxybutyric Acid 4.12 (H) 0.05 - 0.27 mmol/L  Comment: Performed at [facility], [place] [address] [place], [place] [address]  SARS Coronavirus 2 by RT PCR (hospital order, performed in [facility] hospital lab) Nasopharyngeal Nasopharyngeal Swab     Status: None   Collection Time: 04/02/20  4:03 PM   Specimen: Nasopharyngeal Swab  Result Value Ref Range   SARS Coronavirus 2 NEGATIVE NEGATIVE    Comment: (NOTE) SARS-CoV-2 target nucleic acids are NOT DETECTED.  The SARS-CoV-2 RNA is generally detectable in upper and lower respiratory specimens during the acute phase of infection. The lowest concentration of SARS-CoV-2 viral copies this assay can detect is 250 copies / mL. A negative result does not preclude SARS-CoV-2 infection and should not be used as the sole basis for treatment or other patient management decisions.  A negative result may occur with improper specimen collection / handling, submission of specimen other than nasopharyngeal swab, presence of viral mutation(s) within the areas targeted by this assay, and inadequate number of viral copies (<250 copies / mL). A negative result must be combined with clinical observations, patient history, and epidemiological information.  Fact Sheet for Patients:   StrictlyIdeas.no  Fact Sheet for Healthcare Providers: BankingDealers.co.za  This test is not yet approved or  cleared by the [place] FDA and has been authorized for detection and/or diagnosis of SARS-CoV-2 by FDA under an Emergency Use Authorization (EUA).  This EUA will remain in effect (meaning this test can be used) for the duration of the COVID-19 declaration under Section  564(b)(1) of the Act, 21 U.S.C. section 360bbb-3(b)(1), unless the authorization is terminated or revoked sooner.  Performed at [facility], [place] [address], [place], [place] [address]   Basic metabolic panel     Status: Abnormal   Collection Time: 04/02/20  5:28 PM  Result Value Ref Range   Sodium 134 (L) 135 - 145 mmol/L   Potassium 5.3 (H) 3.5 - 5.1 mmol/L   Chloride 101 98 - 111 mmol/L   CO2 15 (L) 22 - 32 mmol/L   Glucose, Bld 311 (H) 70 - 99 mg/dL    Comment: Glucose reference range applies only to samples taken after fasting for at least 8 hours.   BUN 35 (H) 8 - 23 mg/dL   Creatinine, Ser 1.46 (H) 0.61 - 1.24 mg/dL   Calcium 9.1 8.9 - 10.3 mg/dL   GFR calc non Af Amer 45 (L) >60 mL/min   GFR calc Af Amer 52 (L) >60 mL/min   Anion gap 18 (H) 5 - 15    Comment: Performed at [facility], [place] [address], [place], [place] [address]  Beta-hydroxybutyric acid     Status: Abnormal   Collection Time: 04/02/20  5:28 PM  Result Value Ref Range   Beta-Hydroxybutyric Acid 2.53 (H) 0.05 - 0.27 mmol/L    Comment: Performed at [facility], [place] [address] [place], [place] [address]  Hemoglobin A1c     Status: Abnormal   Collection Time: 04/02/20  5:28 PM  Result Value Ref Range   Hgb A1c MFr Bld 6.4 (H) 4.8 - 5.6 %    Comment: (NOTE) Pre diabetes:          5.7%-6.4%  Diabetes:              >6.4%  Glycemic control for   <7.0% adults with diabetes    Mean Plasma Glucose 136.98 mg/dL    Comment: Performed at [place],  [place] [address]  CBG monitoring, ED     Status: Abnormal   Collection Time: 04/02/20  5:41 PM  Result Value Ref Range   Glucose-Capillary 271 (H) 70 - 99 mg/dL    Comment: Glucose reference range applies only to samples taken after fasting for at least 8 hours.   Comment 1 Notify RN    Comment 2 Document in Chart   CBG monitoring, ED     Status: Abnormal   Collection  Time: 04/02/20  7:21 PM  Result Value Ref Range   Glucose-Capillary 267 (H) 70 - 99 mg/dL    Comment: Glucose reference range applies only to samples taken after fasting for at least 8 hours.  Glucose, capillary     Status: Abnormal   Collection Time: 04/02/20  8:57 PM  Result Value Ref Range   Glucose-Capillary 138 (H) 70 - 99 mg/dL    Comment: Glucose reference range applies only to samples taken after fasting for at least 8 hours.   Comment 1 Notify RN    Comment 2 Document in Chart   Basic metabolic panel     Status: Abnormal   Collection Time: 04/02/20  9:01 PM  Result Value Ref Range   Sodium 135 135 - 145 mmol/L   Potassium 4.6 3.5 - 5.1 mmol/L   Chloride 99 98 - 111 mmol/L   CO2 21 (L) 22 - 32 mmol/L   Glucose, Bld 159 (H) 70 - 99 mg/dL    Comment: Glucose reference range applies only to samples taken after fasting for at least 8 hours.   BUN 35 (H) 8 - 23 mg/dL   Creatinine, Ser 1.29 (H) 0.61 - 1.24 mg/dL   Calcium 9.6 8.9 - 10.3 mg/dL   GFR calc non Af Amer 52 (L) >60 mL/min   GFR calc Af Amer >60 >60 mL/min   Anion gap 15 5 - 15    Comment: Performed at [facility], [place] [address], [place], [place] [address]  Glucose, capillary     Status: Abnormal   Collection Time: 04/02/20 10:00 PM  Result Value Ref Range   Glucose-Capillary 134 (H) 70 - 99 mg/dL    Comment: Glucose reference range applies only to samples taken after fasting for at least 8 hours.   Comment 1 Notify RN    Comment 2 Document in Chart   Glucose, capillary     Status: Abnormal   Collection Time: 04/02/20 11:03 PM  Result Value Ref Range   Glucose-Capillary 153 (H) 70 - 99 mg/dL    Comment: Glucose reference range applies only to samples taken after fasting for at least 8 hours.  Glucose, capillary     Status: Abnormal   Collection Time: 04/03/20 12:02 AM  Result Value Ref Range   Glucose-Capillary 144 (H) 70 - 99 mg/dL    Comment: Glucose reference range applies only to samples  taken after fasting for at least 8 hours.  Glucose, capillary     Status: Abnormal   Collection Time: 04/03/20  1:06 AM  Result Value Ref Range   Glucose-Capillary 167 (H) 70 - 99 mg/dL    Comment: Glucose reference range applies only to samples taken after fasting for at least 8 hours.  Basic metabolic panel     Status: Abnormal   Collection Time: 04/03/20  1:11 AM  Result Value Ref Range   Sodium 133 (L) 135 - 145 mmol/L   Potassium 4.2 3.5 - 5.1 mmol/L   Chloride 100 98 -  111 mmol/L   CO2 24 22 - 32 mmol/L   Glucose, Bld 166 (H) 70 - 99 mg/dL    Comment: Glucose reference range applies only to samples taken after fasting for at least 8 hours.   BUN 34 (H) 8 - 23 mg/dL   Creatinine, Ser 1.11 0.61 - 1.24 mg/dL   Calcium 9.2 8.9 - 10.3 mg/dL   GFR calc non Af Amer >60 >60 mL/min   GFR calc Af Amer >60 >60 mL/min   Anion gap 9 5 - 15    Comment: Performed at [facility], [place] [address] [place], [place] [address]  Beta-hydroxybutyric acid     Status: None   Collection Time: 04/03/20  1:11 AM  Result Value Ref Range   Beta-Hydroxybutyric Acid 0.18 0.05 - 0.27 mmol/L    Comment: Performed at [facility], [place] [address] [place], [place] [address]  Glucose, capillary     Status: Abnormal   Collection Time: 04/03/20  2:04 AM  Result Value Ref Range   Glucose-Capillary 173 (H) 70 - 99 mg/dL    Comment: Glucose reference range applies only to samples taken after fasting for at least 8 hours.   Comment 1 Notify RN    Comment 2 Document in Chart   Glucose, capillary     Status: Abnormal   Collection Time: 04/03/20  3:10 AM  Result Value Ref Range   Glucose-Capillary 152 (H) 70 - 99 mg/dL    Comment: Glucose reference range applies only to samples taken after fasting for at least 8 hours.  Hepatic function panel     Status: Abnormal   Collection Time: 04/03/20  4:36 AM  Result Value Ref Range   Total Protein 6.3 (L) 6.5 - 8.1 g/dL   Albumin 2.5  (L) 3.5 - 5.0 g/dL   AST 37 15 - 41 U/L   ALT 59 (H) 0 - 44 U/L   Alkaline Phosphatase 109 38 - 126 U/L   Total Bilirubin 2.1 (H) 0.3 - 1.2 mg/dL   Bilirubin, Direct 1.1 (H) 0.0 - 0.2 mg/dL   Indirect Bilirubin 1.0 (H) 0.3 - 0.9 mg/dL    Comment: Performed at [facility], [place] [address] [place], [place] [address]  Protime-INR     Status: Abnormal   Collection Time: 04/03/20  4:36 AM  Result Value Ref Range   Prothrombin Time 17.7 (H) 11.4 - 15.2 seconds   INR 1.5 (H) 0.8 - 1.2    Comment: (NOTE) INR goal varies based on device and disease states. Performed at [facility], [place] [address], [place],  [address]   Basic metabolic panel     Status: Abnormal   Collection Time: 04/03/20  4:36 AM  Result Value Ref Range   Sodium 135 135 - 145 mmol/L   Potassium 4.1 3.5 - 5.1 mmol/L   Chloride 99 98 - 111 mmol/L   CO2 26 22 - 32 mmol/L   Glucose, Bld 147 (H) 70 - 99 mg/dL    Comment: Glucose reference range applies only to samples taken after fasting for at least 8 hours.   BUN 33 (H) 8 - 23 mg/dL   Creatinine, Ser 1.18 0.61 - 1.24 mg/dL   Calcium 9.2 8.9 - 10.3 mg/dL   GFR calc non Af Amer 58 (L) >60 mL/min   GFR calc Af Amer >60 >60 mL/min   Anion gap 10 5 - 15    Comment: Performed at [facility]  [place] [address], [place], [place] [address]  Glucose, capillary     Status: Abnormal   Collection Time: 04/03/20  5:16 AM  Result Value Ref Range   Glucose-Capillary 168 (H) 70 - 99 mg/dL    Comment: Glucose reference range applies only to samples taken after fasting for at least 8 hours.  Glucose, capillary     Status: Abnormal   Collection Time: 04/03/20  6:14 AM  Result Value Ref Range   Glucose-Capillary 162 (H) 70 - 99 mg/dL    Comment: Glucose reference range applies only to samples taken after fasting for at least 8 hours.  MRSA PCR Screening     Status: None   Collection Time: 04/03/20  6:36 AM   Specimen:  Nasopharyngeal Wash  Result Value Ref Range   MRSA by PCR NEGATIVE NEGATIVE    Comment:        The GeneXpert MRSA Assay (FDA approved for NASAL specimens only), is one component of a comprehensive MRSA colonization surveillance program. It is not intended to diagnose MRSA infection nor to guide or monitor treatment for MRSA infections. Performed at [facility], [place] [address] [place], [place] [address]   Glucose, capillary     Status: Abnormal   Collection Time: 04/03/20  7:20 AM  Result Value Ref Range   Glucose-Capillary 122 (H) 70 - 99 mg/dL    Comment: Glucose reference range applies only to samples taken after fasting for at least 8 hours.   Comment 1 Notify RN    Comment 2 Document in Chart     DG Chest 2 View  Result Date: 04/02/2020 CLINICAL DATA:  Nausea, vomiting EXAM: CHEST - 2 VIEW COMPARISON:  01/18/2020 FINDINGS: Left pacer remains in place, unchanged. Heart is normal size. No confluent opacities or effusions. No acute bony abnormality. IMPRESSION: No active cardiopulmonary disease. Electronically Signed   By: Rolm Baptise M.D.   On: 04/02/2020 20:42    Review of Systems  All other systems reviewed and are negative.  Blood pressure (!) 152/63, pulse 85, temperature 97.9 F (36.6 C), temperature source Oral, resp. rate (!) 26, height 6\' 2"  (1.88 m), weight 82.8 kg, SpO2 97 %. Physical Exam Constitutional:      Appearance: He is well-developed.  HENT:     Head: Atraumatic.  Pulmonary:     Effort: Pulmonary effort is normal.  Musculoskeletal:     Comments: L knee with large effusion, warm to touch, pain with ROM. NVID  Skin:    General: Skin is warm and dry.  Neurological:     Mental Status: He is alert and oriented to person, place, and time.     Assessment/Plan: C/o L knee septic arthritis Aspirated at bedside STAT cx, GS and cell count pending NPO for possible I&D  Procedure: The patient's identity and site of procedure were  verified and verbal consent was obtained.  The left lateral knee was sterilely prepped with alcohol and infiltrated with 1% lidocaine 3 cc with a 25-gauge needle.  After a few minutes the area was again sterilely prepped with alcohol and an 18-gauge needle was used to enter the knee joint and used to aspirate 45 cc of purulent yellow fluid.  The knee was withdrawn and a sterile bandage was applied.  Patient tolerated the procedure well.  The fluid was sent for stat analysis.  [name] 04/03/2020, 9:16 AM

## 2020-04-04 ENCOUNTER — Encounter (HOSPITAL_COMMUNITY): Payer: Self-pay | Admitting: Orthopedic Surgery

## 2020-04-04 LAB — COMPREHENSIVE METABOLIC PANEL
ALT: 45 U/L — ABNORMAL HIGH (ref 0–44)
AST: 32 U/L (ref 15–41)
Albumin: 2.3 g/dL — ABNORMAL LOW (ref 3.5–5.0)
Alkaline Phosphatase: 90 U/L (ref 38–126)
Anion gap: 10 (ref 5–15)
BUN: 27 mg/dL — ABNORMAL HIGH (ref 8–23)
CO2: 25 mmol/L (ref 22–32)
Calcium: 8.7 mg/dL — ABNORMAL LOW (ref 8.9–10.3)
Chloride: 101 mmol/L (ref 98–111)
Creatinine, Ser: 0.91 mg/dL (ref 0.61–1.24)
GFR calc Af Amer: >60 mL/min (ref >60)
GFR calc non Af Amer: >60 mL/min (ref >60)
Glucose, Bld: 157 mg/dL — ABNORMAL HIGH (ref 70–99)
Potassium: 4.2 mmol/L (ref 3.5–5.1)
Sodium: 136 mmol/L (ref 135–145)
Total Bilirubin: 1.8 mg/dL — ABNORMAL HIGH (ref 0.3–1.2)
Total Protein: 5.4 g/dL — ABNORMAL LOW (ref 6.5–8.1)

## 2020-04-04 LAB — CBC WITH DIFFERENTIAL/PLATELET
Abs Immature Granulocytes: 0.08 10*3/uL — ABNORMAL HIGH (ref 0.00–0.07)
Basophils Absolute: 0 10*3/uL (ref 0.0–0.1)
Basophils Relative: 0 %
Eosinophils Absolute: 0 10*3/uL (ref 0.0–0.5)
Eosinophils Relative: 0 %
HCT: 35.3 % — ABNORMAL LOW (ref 39.0–52.0)
Hemoglobin: 11.6 g/dL — ABNORMAL LOW (ref 13.0–17.0)
Immature Granulocytes: 1 %
Lymphocytes Relative: 11 %
Lymphs Abs: 0.9 10*3/uL (ref 0.7–4.0)
MCH: 33 pg (ref 26.0–34.0)
MCHC: 32.9 g/dL (ref 30.0–36.0)
MCV: 100.3 fL — ABNORMAL HIGH (ref 80.0–100.0)
Monocytes Absolute: 0.9 10*3/uL (ref 0.1–1.0)
Monocytes Relative: 11 %
Neutro Abs: 6.3 10*3/uL (ref 1.7–7.7)
Neutrophils Relative %: 77 %
Platelets: 346 10*3/uL (ref 150–400)
RBC: 3.52 MIL/uL — ABNORMAL LOW (ref 4.22–5.81)
RDW: 11.9 % (ref 11.5–15.5)
WBC: 8.1 10*3/uL (ref 4.0–10.5)
nRBC: 0 % (ref 0.0–0.2)

## 2020-04-04 LAB — GLUCOSE, CAPILLARY
Glucose-Capillary: 122 mg/dL — ABNORMAL HIGH (ref 70–99)
Glucose-Capillary: 132 mg/dL — ABNORMAL HIGH (ref 70–99)
Glucose-Capillary: 147 mg/dL — ABNORMAL HIGH (ref 70–99)
Glucose-Capillary: 120 mg/dL — ABNORMAL HIGH (ref 70–99)

## 2020-04-04 NOTE — NC FL2 (Signed)
[place] LEVEL OF CARE SCREENING TOOL     IDENTIFICATION  Patient Name: Jesse [name] Birthdate: 1940-12-29 Sex: male Admission Date (Current Location): 04/02/2020  [facility] and [place] Number:  Herbalist and Address:  [facility],  [place] [address], [place]      Provider Number: [id number]  Attending Physician Name and Address:  Nita Sells, MD  Relative Name and Phone Number:  Roena Malady [phone]    Current Level of Care: Hospital Recommended Level of Care: [place] Prior Approval Number:    Date Approved/Denied:   PASRR Number: [id number]  Discharge Plan: SNF    Current Diagnoses: Patient Active Problem List   Diagnosis Date Noted  . DKA (diabetic ketoacidoses) ([place]) 04/02/2020  . Encounter for care of pacemaker 03/16/2019  . Coronary artery disease 12/15/2018  . 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 Flu-like symptoms 09/04/2018  . Troponin level elevated 09/02/2018  . CAD (coronary artery disease), native coronary artery 08/30/2018  . Essential hypertension   . Mixed hyperlipidemia   . Angina pectoris ([place]) 02/03/2015  . Post PTCA 02/03/2015  . Diabetes mellitus ([place]) 02/03/2015  . Sick sinus syndrome ([place])   . Pacemaker Medtronic dual chamber adapta ADDR01 on 11/20/2009 in [place]. 11/20/2009  . 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    Orientation RESPIRATION BLADDER Height & Weight     Self, Time, Situation, Place  Normal Continent Weight: 182 lb 8.7 oz (82.8 kg) Height:  6\' 2"  (188 cm)  BEHAVIORAL SYMPTOMS/MOOD NEUROLOGICAL BOWEL NUTRITION STATUS      Continent Diet (Carb modified)  AMBULATORY STATUS COMMUNICATION OF NEEDS Skin   Limited Assist Verbally PU Stage and Appropriate Care PU Stage 1 Dressing: No Dressing                     Personal Care Assistance Level of Assistance  Bathing, Feeding, Dressing Bathing Assistance: Limited assistance Feeding  assistance: Independent Dressing Assistance: Limited assistance     Functional Limitations Info  Sight, Hearing, Speech Sight Info: Adequate Hearing Info: Adequate Speech Info: Adequate    SPECIAL CARE FACTORS FREQUENCY  PT (By licensed PT), OT (By licensed OT)     PT Frequency: Minimum 5x a week OT Frequency: Minimum 5x a week            Contractures Contractures Info: Not present    Additional Factors Info  Code Status, Allergies, Insulin Sliding Scale Code Status Info: DNR Allergies Info: Brilinta   Insulin Sliding Scale Info: insulin aspart (novoLOG) injection 0-9 Units       Current Medications (04/04/2020):  This is the current hospital active medication list Current Facility-Administered Medications  Medication Dose Route Frequency Provider Last Rate Last Admin  . (feeding supplement) PROSource Plus liquid 30 mL  30 mL Oral BID BM Nita Sells, MD   30 mL at 04/04/20 0905  . acetaminophen (TYLENOL) tablet 1,000 mg  1,000 mg Oral Q6H Laliberte, Danielle, PA-C   1,000 mg at 04/04/20 1443  . docusate sodium (COLACE) capsule 100 mg  100 mg Oral BID Grier Mitts, PA-C   100 mg at 04/04/20 0901  . dorzolamide-timolol (COSOPT) 22.3-6.8 MG/ML ophthalmic solution 1 drop  1 drop Both Eyes BID Nita Sells, MD   1 drop at 04/04/20 0903  . insulin aspart (novoLOG) injection 0-5 Units  0-5 Units Subcutaneous QHS Samtani, Jai-Gurmukh, MD      . insulin aspart (novoLOG) injection 0-9  Units  0-9 Units Subcutaneous TID WC Nita Sells, MD   1 Units at 04/04/20 1223  . insulin glargine (LANTUS) injection 10 Units  10 Units Subcutaneous QHS Nita Sells, MD   10 Units at 04/03/20 2359  . lactated ringers infusion   Intravenous Continuous Nita Sells, MD 50 mL/hr at 04/04/20 1448 New Bag at 04/04/20 1448  . latanoprost (XALATAN) 0.005 % ophthalmic solution 1 drop  1 drop Both Eyes QHS Nita Sells, MD   1 drop at 04/03/20 2127  .  MEDLINE mouth rinse  15 mL Mouth Rinse BID Nita Sells, MD   15 mL at 04/04/20 1054  . metFORMIN (GLUCOPHAGE-XR) 24 hr tablet 500 mg  500 mg Oral QAC breakfast Nita Sells, MD   500 mg at 04/04/20 0902  . metoCLOPramide (REGLAN) tablet 5-10 mg  5-10 mg Oral Q8H PRN Grier Mitts, PA-C       Or  . metoCLOPramide (REGLAN) injection 5-10 mg  5-10 mg Intravenous Q8H PRN Grier Mitts, PA-C      . metoprolol tartrate (LOPRESSOR) tablet 25 mg  25 mg Oral BID Nita Sells, MD   25 mg at 04/04/20 0901  . morphine 2 MG/ML injection 2 mg  2 mg Intravenous Q2H PRN Grier Mitts, PA-C   2 mg at 04/04/20 1443  . multivitamin with minerals tablet 1 tablet  1 tablet Oral Daily Nita Sells, MD   1 tablet at 04/04/20 0901  . nitroGLYCERIN (NITROSTAT) SL tablet 0.4 mg  0.4 mg Sublingual Q5 min PRN Nita Sells, MD      . ondansetron (ZOFRAN) tablet 4 mg  4 mg Oral Q6H PRN Grier Mitts, PA-C       Or  . ondansetron (ZOFRAN) injection 4 mg  4 mg Intravenous Q6H PRN Grier Mitts, PA-C      . oxyCODONE (Oxy IR/ROXICODONE) immediate release tablet 5-10 mg  5-10 mg Oral Q4H PRN Grier Mitts, PA-C   5 mg at 04/04/20 1203  . prochlorperazine (COMPAZINE) injection 10 mg  10 mg Intravenous Q6H PRN Nita Sells, MD   10 mg at 04/03/20 0123  . rivaroxaban (XARELTO) tablet 20 mg  20 mg Oral Q supper Nita Sells, MD   20 mg at 04/03/20 1712  . simvastatin (ZOCOR) tablet 40 mg  40 mg Oral Daily Nita Sells, MD   40 mg at 04/04/20 0902  . vancomycin (VANCOREADY) IVPB 1500 mg/300 mL  1,500 mg Intravenous Q24H Minda Ditto, [place] at 04/04/20 9628     Discharge Medications: Please see discharge summary for a list of discharge medications.  Relevant Imaging Results:  Relevant Lab Results:   Additional Information SSN [id number]  [name], LCSW

## 2020-04-04 NOTE — TOC Progression Note (Addendum)
Transition of Care [facility]) - Progression Note    Patient Details  Name: Jesse [name] MRN: [id number] Date of Birth: 1941/06/18  Transition of Care [facility]) CM/SW Contact  [name], [place] Phone Number: 04/04/2020, 1:32 PM  Clinical Narrative:     CSW spoke to patient, his wife, and their daughter [name].  Patient is requesting that his daughter be the main contact, due to some memory issues with his wife.  CSW updated emergency contacts.  CSW spoke to patient and family they would like to pursue SNF for short term rehab.  CSW explained process and what to expect at SNF.  CSW awaiting for PT recommendations, CSW to continue to follow patient's progress throughout discharge planning.   Expected Discharge Plan: Home/Self Care Barriers to Discharge: Continued Medical Work up  Expected Discharge Plan and Services Expected Discharge Plan: Home/Self Care   Discharge Planning Services: CM Consult   Living arrangements for the past 2 months: Single Family Home                                       Social Determinants of Health (SDOH) Interventions    Readmission Risk Interventions Readmission Risk Prevention Plan 04/04/2020  Transportation Screening Complete  PCP or Specialist Appt within 3-5 Days Complete  HRI or [place] Complete  Social Work Consult for [place] Planning/Counseling Complete  Palliative Care Screening Not Applicable  Medication Review [employer]) Referral to Pharmacy  Some recent data might be hidden

## 2020-04-04 NOTE — Progress Notes (Signed)
PROGRESS NOTE    Jesse [name]  EXH:[id number] DOB: 02-Aug-1940 DOA: 04/02/2020 PCP: Deland Pretty, MD  Brief Narrative:  79 year old white male CAD three-vessel CABG DES 11/29/2017 Dr. 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 Diabetes mellitus nuclear stress showed EF 45-50% 02/06/2020  Came to emergency room 03/25/2020 painful knee swelling persist with movement-underwent arthrocentesis no fluid could be aspirated felt this was a hematoma CT was performed showing no fracture-Home health services ordered patient was given some narcotics on discharge and told to follow-up with orthopedic doctor Erlinda Hong of Ortho who he has not seen yet He was given a knee immobilizer  Patient was then sent home and he was not given any specific steroids or anything to account for his hyperglycemia  Returns to ED 9/8 with multiple episodes of vomiting inability to eat for the past several days He states that sugars are usually well controlled in the 200 range found to be in DKA with AG=22, CO2 15 Potassium 5.0, sod 132  EKG benign showing paced rhythm, 2 view chest x-ray negative    Assessment & Plan:   Active Problems:   DKA (diabetic ketoacidoses) ([place])   1. DKA likely 2/2 septic arthritis a. Gap closed on 9/9 b. CBGs acceptable 1 22-1 57 continuing Lantus 10 units and reinitiated Metformin c. U Amaryl from prior to admission is on hold 2. Septic knee status post knee washout 9/9 a. Patient had 45 cc purulent fluid-cultures still pending b. Washout showed purulence within the knee with significant debridement c. Continuing currently vancomycin monotherapy but can delineate based on culture report and presence or absence of fever 3. AKI on admission secondary to sepsis/DKA and prior to admission losartan a. Much improved with IV fluid at 50 cc/h-likely saline lock in a.m. 9/11 b. Holding ARB as above 4. CAD status post three-vessel CABG  in addition to last DES 11/29/2017 Dr. Einar Gip a. Losartan held and probably will reinitiate in the outpatient setting b. Continue metoprolol 25 twice daily 5. Mildly elevated LFTs a. Probably combination of sepsis physiology DKA b. No further work-up at this juncture needs outpatient screening labs 6. 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 On metoprolol, continue Xarelto 20 mg daily at this time 7. 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 DC all fluids none at bedside b. Will need outpatient follow-up with Dr. Einar Gip  DVT prophylaxis: Xarelto Code Status: Full Family Communication:  Disposition:   Status is: Inpatient  Remains inpatient appropriate because:Ongoing active pain requiring inpatient pain management and Ongoing diagnostic testing needed not appropriate for outpatient work up   Dispo: The patient is from: Home              Anticipated d/c is to: SNF              Anticipated d/c date is: 2 days              Patient currently is not medically stable to d/c.       Consultants:   Dr. Tamera Punt of orthopedics  Procedures: Aspiration of knee 9/9 Knee washout 9/9  Antimicrobials: Vancomycin   Subjective: Feels much better Has a drain on knee No chest pain no fever Tolerating diet No nausea no vomiting Has not been out of bed yet but is scheduled to work with therapy later today States pain is 3/10 Has no chest pain  Objective: Vitals:   04/03/20  1838 04/03/20 2115 04/04/20 0007 04/04/20 0446  BP: (!) 145/64 (!) 125/102 136/71 129/70  Pulse: 81 89 74 76  Resp: 18 20 20  (!) 22  Temp: 98.4 F (36.9 C) 98.2 F (36.8 C) 97.9 F (36.6 C) 98 F (36.7 C)  TempSrc: Oral Oral Oral Oral  SpO2: 100% 98% 98% 97%  Weight:      Height:        Intake/Output Summary (Last 24 hours) at 04/04/2020 0951 Last data filed at 04/04/2020 0900 Gross per 24 hour  Intake 1470.03 ml  Output 2400 ml  Net -929.97 ml   Filed  Weights   04/02/20 0757 04/02/20 2100  Weight: 86.2 kg 82.8 kg    Examination:   Coherent pleasant no distress moderate dentition EOMI NCAT no focal deficit Chest clear no rales no rhonchi S1-S2-has paced rhythm on monitors therefore this was discontinued on 9/10 Left knee has Ace wrap in addition to drain with Hemovac in place he is able to move his toes extremities are warm  Data Reviewed: I have personally reviewed following labs and imaging studies  Sodium currently 135-->136 CO2 25 BUNs/creatinine 27/1.4 on admit-->33/1.1-->27/0.9 LFT 64/78-->37/59-->32/45 Bilirubin 3.9-->2.1-->1.8 White count 8.1   Radiology Studies: DG Chest 2 View  Result Date: 04/02/2020 CLINICAL DATA:  Nausea, vomiting EXAM: CHEST - 2 VIEW COMPARISON:  01/18/2020 FINDINGS: Left pacer remains in place, unchanged. Heart is normal size. No confluent opacities or effusions. No acute bony abnormality. IMPRESSION: No active cardiopulmonary disease. Electronically Signed   By: Rolm Baptise M.D.   On: 04/02/2020 20:42     Scheduled Meds: . (feeding supplement) PROSource Plus  30 mL Oral BID BM  . acetaminophen  1,000 mg Oral Q6H  . docusate sodium  100 mg Oral BID  . dorzolamide-timolol  1 drop Both Eyes BID  . insulin aspart  0-5 Units Subcutaneous QHS  . insulin aspart  0-9 Units Subcutaneous TID WC  . insulin glargine  10 Units Subcutaneous QHS  . latanoprost  1 drop Both Eyes QHS  . mouth rinse  15 mL Mouth Rinse BID  . metFORMIN  500 mg Oral QAC breakfast  . metoprolol tartrate  25 mg Oral BID  . multivitamin with minerals  1 tablet Oral Daily  . rivaroxaban  20 mg Oral Q supper  . simvastatin  40 mg Oral Daily   Continuous Infusions: . lactated ringers 50 mL/hr at 04/03/20 1852  . vancomycin Stopped (04/04/20 0439)     LOS: 2 days    Time spent: 66  Nita Sells, MD [facility] To contact the attending provider between 7A-7P or the covering provider during after hours  7P-7A, please log into the web site [email or web address] and access using universal [place] password for that web site. If you do not have the password, please call the hospital operator.  04/04/2020, 9:51 AM

## 2020-04-04 NOTE — Progress Notes (Signed)
   PATIENT ID: Jesse [name]   1 Day Post-Op Procedure(s) (LRB): LEFT KNEE ARTHROSCOPY  WASHOUT (Left)  Subjective: Reports pain in left knee improving. No other concerns.   Objective:  Vitals:   04/04/20 0007 04/04/20 0446  BP: 136/71 129/70  Pulse: 74 76  Resp: 20 (!) 22  Temp: 97.9 F (36.6 C) 98 F (36.7 C)  SpO2: 98% 97%     L knee ace in place Drain in place with min output Wiggles toes, distally NVI  Labs:  Recent Labs    04/02/20 1141 04/04/20 0518  HGB 14.7 11.6*   Recent Labs    04/02/20 1141 04/04/20 0518  WBC 9.0 8.1  RBC 4.42 3.52*  HCT 44.0 35.3*  PLT 455* 346   Recent Labs    04/03/20 0436 04/04/20 0518  NA 135 136  K 4.1 4.2  CL 99 101  CO2 26 25  BUN 33* 27*  CREATININE 1.18 0.91  GLUCOSE 147* 157*  CALCIUM 9.2 8.7*    Assessment and Plan: 1 day s/p left knee arthroscopic washout Plan to pull drain tomorrow am Okay for WBAT, PT ordered to get up and ambulatory Will continue to follow over weekend Percocet for pain control, morphine for breakthough  VTE proph: asa, scds

## 2020-04-04 NOTE — Care Management Important Message (Signed)
Important Message  Patient Details IM Letter given to the Patient Name: Jesse [name] MRN: [id number] Date of Birth: Dec 09, 1940   Medicare Important Message Given:  Yes     Kerin Salen 04/04/2020, 12:07 PM

## 2020-04-04 NOTE — Evaluation (Signed)
Physical Therapy Evaluation Patient Details Name: Jesse [name] MRN: [id number] DOB: 08-29-1940 Today's Date: 04/04/2020   History of Present Illness  Patient is 79 y.o. male s/p Lt knee arthroscopic washout on 04/03/20. Patient initially presented to ED on 03/25/20 for painful knee and was discharge home as no acute findings were found. Pt returned to ED on 9/8 and was admitted for knee pain and concern for infection. PMH significant for CAD, CABGx3, CKD, HTN, SSS with PPM, DM.     Clinical Impression  Jesse [name] is a 79 y.o. male POD 0 s/p Lt knee washout. Patient reports ~1 month ago he was independent with mobility but has been very limited and has been in bed for ~ 2 weeks. Patient is now limited by functional impairments (see PT problem list below) and requires Mod assist for bed mobility and transfers with RW. Patient was limited by pain and Lt LE weakness this date and did not progress ambulation. Patient will benefit from continued skilled PT interventions to address impairments and progress towards PLOF. Recommending SNF level therapy follow up. Acute PT will follow to progress mobility as able.    Follow Up Recommendations SNF;Supervision/Assistance - 24 hour    Equipment Recommendations  Other (comment) (TBA)    Recommendations for Other Services       Precautions / Restrictions Precautions Precautions: Fall Restrictions Weight Bearing Restrictions: No LLE Weight Bearing: Weight bearing as tolerated      Mobility  Bed Mobility Overal bed mobility: Needs Assistance Bed Mobility: Supine to Sit     Supine to sit: Mod assist;HOB elevated     General bed mobility comments: Mod Assist for sequencing and bringing LE's off EOB as well as raising trunk. Pt requried mod assist to scoot to EOB as well.  Transfers Overall transfer level: Needs assistance Equipment used: Rolling walker (2 wheeled) Transfers: Sit to/from [facility] Sit to Stand: Mod  assist Stand pivot transfers: Mod assist;+2 physical assistance;+2 safety/equipment       General transfer comment: cues for hand placement for power up and to steady with rise. pt limited by pain. pt with mild buckling at Lt knee due to pain and weakness. Cues to increase support with UE's on walker. Mod assist +2 to manage walker position and turn to sit in recliner.   Ambulation/Gait                Stairs            Wheelchair Mobility    Modified Rankin (Stroke Patients Only)       Balance Overall balance assessment: Needs assistance Sitting-balance support: No upper extremity supported;Feet supported Sitting balance-Leahy Scale: Fair     Standing balance support: Bilateral upper extremity supported;During functional activity Standing balance-Leahy Scale: Poor Standing balance comment: Reliant on BUE support                   Pertinent Vitals/Pain Pain Assessment: 0-10 Pain Score: 3  Pain Location: L knee Pain Descriptors / Indicators: Guarding;Grimacing;Aching Pain Intervention(s): Limited activity within patient's tolerance;Monitored during session;Repositioned;Patient requesting pain meds-RN notified;Ice applied    Home Living Family/patient expects to be discharged to:: Private residence Living Arrangements: Spouse/significant other Available Help at Discharge: Family;Available PRN/intermittently Type of Home: House Home Access: Stairs to enter Entrance Stairs-Rails: Right;Left;Can reach both Entrance Stairs-Number of Steps: 2-3 Home Layout: One level Home Equipment: Walker - 2 wheels;Cane - single point;Shower seat - built in;Grab bars - tub/shower  Prior Function Level of Independence: Needs assistance   Gait / Transfers Assistance Needed: Pt reports he has been in bed for ~3-4 weeks and is only getting up if he has to. Over the last 2 weeks he really has not been getting up. Patient reports prior to this he was independent.   ADL's  / Homemaking Assistance Needed: Pt reports his wife is helping to sponge bathe him.  Comments: pt has been more sedentary in las  2-3 weeks     Hand Dominance   Dominant Hand: Right    Extremity/Trunk Assessment   Upper Extremity Assessment Upper Extremity Assessment: Defer to OT evaluation    Lower Extremity Assessment Lower Extremity Assessment: Generalized weakness;LLE deficits/detail LLE Deficits / Details: pt with poor quad activation in quad set, able to complete SLR with no significant lag. pt reports pain in weight bearing and some buckling noted.     Cervical / Trunk Assessment Cervical / Trunk Assessment: Kyphotic  Communication   Communication: No difficulties  Cognition Arousal/Alertness: Awake/alert Behavior During Therapy: WFL for tasks assessed/performed Overall Cognitive Status: Within Functional Limits for tasks assessed             General Comments      Exercises     Assessment/Plan    PT Assessment Patient needs continued PT services  PT Problem List Decreased strength;Decreased balance;Decreased activity tolerance;Decreased mobility;Decreased knowledge of use of DME;Decreased knowledge of precautions;Pain       PT Treatment Interventions Stair training;Gait training;DME instruction;Functional mobility training;Therapeutic activities;Therapeutic exercise;Neuromuscular re-education;Balance training;Wheelchair mobility training;Patient/family education    PT Goals (Current goals can be found in the Care Plan section)  Acute Rehab PT Goals Patient Stated Goal: to get back independence PT Goal Formulation: With patient Time For Goal Achievement: 04/18/20 Potential to Achieve Goals: Fair    Frequency Min 3X/week   Barriers to discharge Decreased caregiver support         AM-PAC PT "6 Clicks" Mobility  Outcome Measure Help needed turning from your back to your side while in a flat bed without using bedrails?: A Lot Help needed moving from  lying on your back to sitting on the side of a flat bed without using bedrails?: A Lot Help needed moving to and from a bed to a chair (including a wheelchair)?: A Lot Help needed standing up from a chair using your arms (e.g., wheelchair or bedside chair)?: A Lot Help needed to walk in hospital room?: A Lot Help needed climbing 3-5 steps with a railing? : A Lot 6 Click Score: 12    End of Session Equipment Utilized During Treatment: Gait belt Activity Tolerance: Patient tolerated treatment well Patient left: in chair;with call bell/phone within reach;with chair alarm set Nurse Communication: Mobility status PT Visit Diagnosis: Unsteadiness on feet (R26.81);Muscle weakness (generalized) (M62.81);Pain Pain - Right/Left: Left Pain - part of body: Knee    Time: 1140-1205 PT Time Calculation (min) (ACUTE ONLY): 25 min   Charges:   PT Evaluation $PT Eval Moderate Complexity: 1 Mod PT Treatments $Therapeutic Activity: 8-22 mins       Verner Mould, DPT Acute Rehabilitation Services  Office [phone] Pager [phone]  04/04/2020 1:57 PM

## 2020-04-04 NOTE — Evaluation (Signed)
Occupational Therapy Evaluation Patient Details Name: Jesse [name] MRN: [id number] DOB: 1940/10/16 Today's Date: 04/04/2020    History of Present Illness Patient is 79 y.o. male s/p Lt knee arthroscopic washout on 04/03/20. Patient initially presented to ED on 03/25/20 for painful knee and was discharge home as no acute findings were found. Pt returned to ED on 9/8 with vomiting, found to be in DKA, and was admitted for continued knee pain and concern for infection. Pt s/p LT knee arthroscopic I&D with washout on 04/03/20. PMH significant for CAD, CABGx3, CKD, HTN, SSS with PPM, DM.   Clinical Impression   An occupational therapy evaluation was completed on this 79 year old,  Male with PMH as above. Patient is currently requiring assistance with ADLs including maximum assist with LE dressing, toileting and LE bathing, contact guard assist with UE dressing and full setup for seated grooming, all of which is below patient's typical baseline of being Independent as of 3-4 weeks ago.  During this evaluation, patient was limited by LT knee as sacral pain, decreased standing balance, UE weakness with poor posture on RW, and LLE weakness, all of which has the potential to impact patient's safety and independence during functional mobility, as well as performance for ADLs. ?[place] "6-clicks" Daily Activity Inpatient Short Form score of 16/24 indicates a 53.32% ADL impairment this session. Patient lives with his wife, who is able to provide 24/7 supervision and assistance, but is 48 and will need to limit the physical assistance provided.  Patient demonstrates good rehab potential, and should benefit from continued skilled occupational therapy services while in acute care to maximize safety, independence and quality of life at home.  Continued occupational therapy services in a SNF setting prior to return home is recommended.  ?     Follow Up Recommendations  Supervision/Assistance - 24 hour;SNF     Equipment Recommendations  3 in 1 bedside commode    Recommendations for Other Services       Precautions / Restrictions Precautions Precautions: Fall Precaution Comments: Hemovac to LLE Restrictions Weight Bearing Restrictions: No LLE Weight Bearing: Weight bearing as tolerated      Mobility Bed Mobility Overal bed mobility: Needs Assistance Bed Mobility: Supine to Sit     Supine to sit: Mod assist;HOB elevated     General bed mobility comments: Pt up in recliner and left EOB for meal.  Transfers Overall transfer level: Needs assistance Equipment used: Rolling walker (2 wheeled) Transfers: Sit to/from [facility] Sit to Stand: Mod assist Stand pivot transfers: Mod assist;+2 physical assistance;+2 safety/equipment       General transfer comment: cues for hand placement for power up and to steady with rise as well as for BUE reach-back for controlled descent.  Cues to increase support with UE's on walker.    Balance Overall balance assessment: Needs assistance Sitting-balance support: No upper extremity supported;Feet supported Sitting balance-Leahy Scale: Good     Standing balance support: Bilateral upper extremity supported;During functional activity Standing balance-Leahy Scale: Poor Standing balance comment: Reliant on BUE support                            ADL either performed or assessed with clinical judgement   ADL Overall ADL's : Needs assistance/impaired Eating/Feeding: Independent   Grooming: Sitting;Set up   Upper Body Bathing: Set up;Min guard;Cueing for compensatory techniques Upper Body Bathing Details (indicate cue type and reason): Due to RUE weakness: Based  on general assessment. Lower Body Bathing: Moderate assistance;Sitting/lateral leans;Sit to/from stand Lower Body Bathing Details (indicate cue type and reason): Based on general assessment. Upper Body Dressing : Min guard;Cueing for compensatory  techniques Upper Body Dressing Details (indicate cue type and reason): Based on general assessment. Lower Body Dressing: Maximal assistance;Set up;Sit to/from stand;Sitting/lateral leans Lower Body Dressing Details (indicate cue type and reason): Pt has a reacher at home and some form of sock aid that he has never used. Pt able to doff RT sock and don with increased time/effort while EOB, but unable to doff/don LT sock and required BUE support on walker in standing. Toilet Transfer: RW;Stand-pivot;Moderate assistance;Cueing for safety;Cueing for sequencing Toilet Transfer Details (indicate cue type and reason): Stand pivot from recliner to EOB with Moderate assist with cues for step sequence as well as UB posture. Toileting- Clothing Manipulation and Hygiene: Maximal assistance;Sit to/from stand;Sitting/lateral lean Toileting - Clothing Manipulation Details (indicate cue type and reason): Based on general assessment.     Functional mobility during ADLs: Moderate assistance;Rolling walker       Vision Baseline Vision/History: No visual deficits Vision Assessment?: No apparent visual deficits     Perception     Praxis      Pertinent Vitals/Pain Pain Assessment: 0-10 Pain Score: 3  Pain Location: L knee: 3/10 rest.  Up to 5/10 with WB.  Also "tailbone" pain of 5/10 in chair. 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 Pt also with compensatory scaption of RT shoulder and proximal weakness esp to tricep and shoulder without pain. Pt reports that the shoulder "may be new".   Lower Extremity Assessment Lower Extremity Assessment: Defer to PT  evaluation LLE Deficits / Details: pt with poor quad activation in quad set, able to complete SLR with no significant lag. pt reports pain in weight bearing and some buckling noted.    Cervical / Trunk Assessment Cervical / Trunk Assessment: Kyphotic   Communication Communication Communication: No difficulties   Cognition Arousal/Alertness: Awake/alert Behavior During Therapy: WFL for tasks assessed/performed Overall Cognitive Status: Within Functional Limits for tasks assessed                                     General Comments       Exercises     Shoulder Instructions      Home Living Family/patient expects to be discharged to:: Private residence Living Arrangements: Spouse/significant other Available Help at Discharge: Family;Available PRN/intermittently Type of Home: House Home Access: Stairs to enter [employer] of Steps: 2-3 Entrance Stairs-Rails: Right;Left;Can reach both Home Layout: One level     Bathroom Shower/Tub: Occupational psychologist: Standard Bathroom Accessibility: Yes How Accessible: Accessible via walker Home Equipment: [place] - 2 wheels;Cane - single point;Shower seat - built in;Grab bars - tub/shower          Prior Functioning/Environment Level of Independence: Needs assistance  Gait / Transfers Assistance Needed: Pt reports he has been in bed for ~3-4 weeks and is only getting up if he has to. Over the last 2 weeks he really has not been getting up. Patient reports prior to this he  was independent, and go for walks every day. ADL's / Homemaking Assistance Needed: Pt reports his wife is helping to sponge bathe him. Pt reports that his wife does all the cooking and cleaning. Wife does not drive, and pt provides transporation.   Comments: pt has been more sedentary in las  2-3 weeks        OT Problem List: Decreased strength;Pain;Decreased range of motion;Decreased activity tolerance;Impaired balance (sitting  and/or standing);Decreased knowledge of use of DME or AE;Decreased knowledge of precautions      OT Treatment/Interventions: Self-care/ADL training;Therapeutic exercise;Therapeutic activities;Neuromuscular education;Energy conservation;DME and/or AE instruction;Patient/family education;Balance training    OT Goals(Current goals can be found in the care plan section) Acute Rehab OT Goals Patient Stated Goal: Resume daily walks OT Goal Formulation: With patient Time For Goal Achievement: 04/18/20 Potential to Achieve Goals: Good ADL Goals Pt Will Perform Grooming: standing;with supervision Pt Will Perform Lower Body Dressing: with set-up;with adaptive equipment;with supervision;sit to/from stand;sitting/lateral leans Pt Will Transfer to Toilet: with supervision;ambulating;bedside commode Pt Will Perform Toileting - Clothing Manipulation and hygiene: with modified independence;with adaptive equipment;sitting/lateral leans;sit to/from stand Pt/caregiver will Perform Home Exercise Program: Increased strength;Both right and left upper extremity;With theraband;Independently;With written HEP provided Additional ADL Goal #1: Pt will identify at least 3 fall prevention strategies to employ at home to prevent injury and rehosptialization.  OT Frequency: Min 2X/week   Barriers to D/C:            Co-evaluation              AM-PAC OT "6 Clicks" Daily Activity     Outcome Measure Help from another person eating meals?: None Help from another person taking care of personal grooming?: A Little Help from another person toileting, which includes using toliet, bedpan, or urinal?: A Lot Help from another person bathing (including washing, rinsing, drying)?: A Lot Help from another person to put on and taking off regular upper body clothing?: A Little Help from another person to put on and taking off regular lower body clothing?: A Lot 6 Click Score: 16   End of Session Equipment Utilized During  Treatment: Gait belt;Rolling walker Nurse Communication: Mobility status;Weight bearing status  Activity Tolerance: Patient tolerated treatment well Patient left: with bed alarm set;Other (comment);with call bell/phone within reach;in bed (Sitting EOB)  OT Visit Diagnosis: Unsteadiness on feet (R26.81);Pain;Muscle weakness (generalized) (M62.81) Pain - Right/Left: Left Pain - part of body: Knee                Time: 1353-1411 OT Time Calculation (min): 18 min Charges:  OT General Charges $OT Visit: 1 Visit OT Evaluation $OT Eval Low Complexity: 1 Low  Conita Amenta, OT Acute Rehab Services Office: [phone] 04/04/2020   Julien Girt 04/04/2020, 2:30 PM

## 2020-04-05 LAB — COMPREHENSIVE METABOLIC PANEL
ALT: 34 U/L (ref 0–44)
AST: 24 U/L (ref 15–41)
Albumin: 2.1 g/dL — ABNORMAL LOW (ref 3.5–5.0)
Alkaline Phosphatase: 90 U/L (ref 38–126)
Anion gap: 9 (ref 5–15)
BUN: 22 mg/dL (ref 8–23)
CO2: 26 mmol/L (ref 22–32)
Calcium: 8.4 mg/dL — ABNORMAL LOW (ref 8.9–10.3)
Chloride: 101 mmol/L (ref 98–111)
Creatinine, Ser: 0.82 mg/dL (ref 0.61–1.24)
GFR calc Af Amer: >60 mL/min (ref >60)
GFR calc non Af Amer: >60 mL/min (ref >60)
Glucose, Bld: 102 mg/dL — ABNORMAL HIGH (ref 70–99)
Potassium: 3.7 mmol/L (ref 3.5–5.1)
Sodium: 136 mmol/L (ref 135–145)
Total Bilirubin: 1.5 mg/dL — ABNORMAL HIGH (ref 0.3–1.2)
Total Protein: 5 g/dL — ABNORMAL LOW (ref 6.5–8.1)

## 2020-04-05 LAB — GLUCOSE, CAPILLARY
Glucose-Capillary: 97 mg/dL (ref 70–99)
Glucose-Capillary: 131 mg/dL — ABNORMAL HIGH (ref 70–99)
Glucose-Capillary: 153 mg/dL — ABNORMAL HIGH (ref 70–99)
Glucose-Capillary: 113 mg/dL — ABNORMAL HIGH (ref 70–99)

## 2020-04-05 LAB — CBC WITH DIFFERENTIAL/PLATELET
Abs Immature Granulocytes: 0.05 10*3/uL (ref 0.00–0.07)
Basophils Absolute: 0 10*3/uL (ref 0.0–0.1)
Basophils Relative: 0 %
Eosinophils Absolute: 0 10*3/uL (ref 0.0–0.5)
Eosinophils Relative: 0 %
HCT: 32.7 % — ABNORMAL LOW (ref 39.0–52.0)
Hemoglobin: 10.8 g/dL — ABNORMAL LOW (ref 13.0–17.0)
Immature Granulocytes: 1 %
Lymphocytes Relative: 11 %
Lymphs Abs: 0.7 10*3/uL (ref 0.7–4.0)
MCH: 32.9 pg (ref 26.0–34.0)
MCHC: 33 g/dL (ref 30.0–36.0)
MCV: 99.7 fL (ref 80.0–100.0)
Monocytes Absolute: 0.8 10*3/uL (ref 0.1–1.0)
Monocytes Relative: 13 %
Neutro Abs: 5 10*3/uL (ref 1.7–7.7)
Neutrophils Relative %: 75 %
Platelets: 289 10*3/uL (ref 150–400)
RBC: 3.28 MIL/uL — ABNORMAL LOW (ref 4.22–5.81)
RDW: 11.6 % (ref 11.5–15.5)
WBC: 6.5 10*3/uL (ref 4.0–10.5)
nRBC: 0 % (ref 0.0–0.2)

## 2020-04-05 MED ORDER — LEVOFLOXACIN 750 MG PO TABS
750.0000 mg | ORAL_TABLET | Freq: Every day | ORAL | Status: DC
  Administered 2020-04-05 – 2020-04-07 (×3): 750 mg via ORAL
  Filled 2020-04-05 (×3): qty 1

## 2020-04-05 MED ORDER — POLYETHYLENE GLYCOL 3350 17 G PO PACK
17.0000 g | PACK | Freq: Every day | ORAL | Status: DC
  Administered 2020-04-05 – 2020-04-07 (×3): 17 g via ORAL
  Filled 2020-04-05 (×3): qty 1

## 2020-04-05 NOTE — Progress Notes (Signed)
PROGRESS NOTE  Jesse [name]  WRU:[id number] DOB: 1941-03-22 DOA: 04/02/2020 PCP: Deland Pretty, MD   Brief Narrative:  79 year old white male CAD three-vessel CABG DES 11/29/2017 Dr. 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 Diabetes mellitus nuclear stress showed EF 45-50% 02/06/2020  Came to emergency room 03/25/2020 painful knee swelling persist with movement-underwent arthrocentesis no fluid could be aspirated felt this was a hematoma CT was performed showing no fracture-Home health services ordered patient was given some narcotics on discharge and told to follow-up with orthopedic doctor Erlinda Hong of Ortho who he has not seen yet He was given a knee immobilizer  Patient was then sent home and he was not given any specific steroids or anything to account for his hyperglycemia  Returns to ED 9/8 with multiple episodes of vomiting inability to eat for the past several days He states that sugars are usually well controlled in the 200 range found to be in DKA with AG=22, CO2 15 Potassium 5.0, sod 132  EKG benign showing paced rhythm, 2 view chest x-ray negative   Assessment & Plan:   Active Problems:   DKA (diabetic ketoacidoses) ([place])   1. DKA likely 2/2 septic arthritis a. Gap closed on 9/9 b. CBGs acceptable 97-131 continuing Lantus 10 units and reinitiated Metformin c. Hold Amaryl  2. Septic knee status post knee washout 9/9 3. ?  Pseudomonas on culture-tells me he was working in the yard several weeks prior to that the swelling and a rock might of cut the skin of his knee which would explain the source a. Patient had 45 cc purulent fluid-NGTD b. Washout showed purulence within the knee with significant debridement-pain control per Ortho-Tylenol first choice OxyIR 5-10 every 4 as needed second choice morphine sparingly third choice c. Transitioning to oral Levaquin 750 expect at least 14 days therapy which can be  increased to 21 days as an outpatient when followed up 4. AKI on admission secondary to sepsis/DKA and prior to admission losartan a. Saline lock 9/11 b. Holding ARB as above 5. CAD status post three-vessel CABG in addition to last DES 11/29/2017 Dr. Einar Gip a. Losartan held and probably will reinitiate in the outpatient setting b. Continue metoprolol 25 twice daily 6. Mildly elevated LFTs a. Probably combination of sepsis physiology DKA b. Resolved likely secondary to sepsis 7. 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 On metoprolol, continue Xarelto 20 mg daily at this time 8. 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 DC all fluids  b. Will need outpatient follow-up with Dr. Einar Gip  DVT prophylaxis: Xarelto Code Status: Full Family Communication:  Disposition:   Status is: Inpatient  Remains inpatient appropriate because:Ongoing active pain requiring inpatient pain management and Ongoing diagnostic testing needed not appropriate for outpatient work up   Dispo: The patient is from: Home              Anticipated d/c is to: SNF              Anticipated d/c date is: 2 days              Patient currently is not medically stable to d/c.       Consultants:   Dr. Tamera Punt of orthopedics  Procedures: Aspiration of knee 9/9 Knee washout 9/9  Antimicrobials: Vancomycin   Subjective: Pain is little worse today 5/10 he had the drain removed Overall he is well No chest pain  no fever About to work with therapy  Objective: Vitals:   04/04/20 1138 04/04/20 2045 04/05/20 0425 04/05/20 1230  BP: (!) 134/58 139/67 138/66 127/72  Pulse: 72 81 72 72  Resp: (!) 21 (!) 22 20 18   Temp: 97.9 F (36.6 C) 98.2 F (36.8 C) 98 F (36.7 C) 98.2 F (36.8 C)  TempSrc: Oral Oral Oral Oral  SpO2: 93% 97% 95% 100%  Weight:      Height:        Intake/Output Summary (Last 24 hours) at 04/05/2020 1310 Last data filed at 04/05/2020 0940 Gross  per 24 hour  Intake 1439.97 ml  Output 1500 ml  Net -60.03 ml   Filed Weights   04/02/20 0757 04/02/20 2100  Weight: 86.2 kg 82.8 kg    Examination:   Coherent pleasant no distress EOMI NCAT no deficit Chest clear without rales or rhonchi S1-S2 seems to be sinus on exam Left leg has Ace wrap with removed Hemovac and slight blood tinge on blankets  Data Reviewed: I have personally reviewed following labs and imaging studies  Sodium currently 135-->136 CO2 26 BUNs/creatinine 27/1.4 on admit-->33/1.1-->27/0.9-->22/0.8 LFT down to 24/34 Bilirubin 1.5 White count 6.5   Radiology Studies: No results found.   Scheduled Meds: . (feeding supplement) PROSource Plus  30 mL Oral BID BM  . acetaminophen  1,000 mg Oral Q6H  . dorzolamide-timolol  1 drop Both Eyes BID  . insulin aspart  0-5 Units Subcutaneous QHS  . insulin aspart  0-9 Units Subcutaneous TID WC  . insulin glargine  10 Units Subcutaneous QHS  . latanoprost  1 drop Both Eyes QHS  . levofloxacin  750 mg Oral Daily  . mouth rinse  15 mL Mouth Rinse BID  . metFORMIN  500 mg Oral QAC breakfast  . metoprolol tartrate  25 mg Oral BID  . multivitamin with minerals  1 tablet Oral Daily  . polyethylene glycol  17 g Oral Daily  . rivaroxaban  20 mg Oral Q supper  . simvastatin  40 mg Oral Daily   Continuous Infusions: . lactated ringers 50 mL/hr at 04/05/20 0948     LOS: 3 days    Time spent: [place], MD [facility] To contact the attending provider between 7A-7P or the covering provider during after hours 7P-7A, please log into the web site [email or web address] and access using universal [place] password for that web site. If you do not have the password, please call the hospital operator.  04/05/2020, 1:10 PM

## 2020-04-05 NOTE — Progress Notes (Signed)
Physical Therapy Treatment Patient Details Name: Jesse [name] MRN: [id number] DOB: 08-14-1940 Today's Date: 04/05/2020    History of Present Illness Patient is 79 y.o. male s/p Lt knee arthroscopic washout on 04/03/20. Patient initially presented to ED on 03/25/20 for painful knee and was discharge home as no acute findings were found. Pt returned to ED on 9/8 with vomiting, found to be in DKA, and was admitted for continued knee pain and concern for infection. Pt s/p LT knee arthroscopic I&D with washout on 04/03/20. PMH significant for CAD, CABGx3, CKD, HTN, SSS with PPM, DM.    PT Comments    Pt in fair spirits and very cooperative.  Pt with noted improvement in activity tolerance and able to ambulate short distance in room with assist.  Pt continues pain limited and fatigues easily.  Follow Up Recommendations  SNF;Supervision/Assistance - 24 hour     Equipment Recommendations  Other (comment)    Recommendations for Other Services       Precautions / Restrictions Precautions Precautions: Fall Restrictions Weight Bearing Restrictions: No LLE Weight Bearing: Weight bearing as tolerated    Mobility  Bed Mobility Overal bed mobility: Needs Assistance Bed Mobility: Supine to Sit     Supine to sit: Min assist;+2 for physical assistance;+2 for safety/equipment     General bed mobility comments: cues for sequence and use of R LE to self assist;  Physical assist to manage L LE and to bring trunk to upright  Transfers Overall transfer level: Needs assistance Equipment used: Rolling walker (2 wheeled) Transfers: Sit to/from Stand Sit to Stand: Min assist;+2 physical assistance;Mod assist;From elevated surface         General transfer comment: cues for LE management and use of UEs to self assist;  Physical assist to bring wt up and fwd and to balance in initial standing  Ambulation/Gait Ambulation/Gait assistance: Min assist;+2 safety/equipment Gait Distance (Feet): 6  Feet Assistive device: Rolling walker (2 wheeled) Gait Pattern/deviations: Step-to pattern;Shuffle;Trunk flexed;Decreased stance time - left Gait velocity: Decreased   General Gait Details: cues for posture, position from RW, sequence and increased UE WB   Stairs             Wheelchair Mobility    Modified Rankin (Stroke Patients Only)       Balance Overall balance assessment: Needs assistance Sitting-balance support: No upper extremity supported;Feet supported Sitting balance-Leahy Scale: Good     Standing balance support: Bilateral upper extremity supported;During functional activity Standing balance-Leahy Scale: Poor                              Cognition Arousal/Alertness: Awake/alert Behavior During Therapy: WFL for tasks assessed/performed Overall Cognitive Status: Within Functional Limits for tasks assessed                                        Exercises General Exercises - Lower Extremity Ankle Circles/Pumps: AROM;Both;15 reps;Supine    General Comments        Pertinent Vitals/Pain Pain Assessment: 0-10 Pain Score: 6  Pain Location: L knee: 3/10 rest.  Up to 6/10 with WB.   Pain Descriptors / Indicators: Guarding;Grimacing;Aching;Sore Pain Intervention(s): Limited activity within patient's tolerance;Monitored during session;Premedicated before session;Ice applied    Home Living  Prior Function            PT Goals (current goals can now be found in the care plan section) Acute Rehab PT Goals Patient Stated Goal: Resume daily walks PT Goal Formulation: With patient Time For Goal Achievement: 04/18/20 Potential to Achieve Goals: Fair Progress towards PT goals: Progressing toward goals    Frequency    Min 3X/week      PT Plan Current plan remains appropriate    Co-evaluation              AM-PAC PT "6 Clicks" Mobility   Outcome Measure  Help needed turning from your  back to your side while in a flat bed without using bedrails?: A Lot Help needed moving from lying on your back to sitting on the side of a flat bed without using bedrails?: A Lot Help needed moving to and from a bed to a chair (including a wheelchair)?: A Lot Help needed standing up from a chair using your arms (e.g., wheelchair or bedside chair)?: A Lot Help needed to walk in hospital room?: A Lot Help needed climbing 3-5 steps with a railing? : A Lot 6 Click Score: 12    End of Session Equipment Utilized During Treatment: Gait belt Activity Tolerance: Patient tolerated treatment well Patient left: in chair;with call bell/phone within reach;with chair alarm set Nurse Communication: Mobility status PT Visit Diagnosis: Unsteadiness on feet (R26.81);Muscle weakness (generalized) (M62.81);Pain Pain - Right/Left: Left Pain - part of body: Knee     Time: 1130-1147 PT Time Calculation (min) (ACUTE ONLY): 17 min  Charges:  $Gait Training: 8-22 mins                     [place] Pager [phone] Office [phone]    Laure Leone 04/05/2020, 12:43 PM

## 2020-04-05 NOTE — Plan of Care (Signed)
Patient plans to discharge to SNF to assist with ADLs, PT, and OT

## 2020-04-05 NOTE — Progress Notes (Signed)
   PATIENT ID: Jesse [name]   2 Days Post-Op Procedure(s) (LRB): LEFT KNEE ARTHROSCOPY  WASHOUT (Left)  Subjective:Reports left knee pain improving. Up with PT, plan to d/c to SNF and agreeable to that.   Objective:  Vitals:   04/04/20 2045 04/05/20 0425  BP: 139/67 138/66  Pulse: 81 72  Resp: (!) 22 20  Temp: 98.2 F (36.8 C) 98 F (36.7 C)  SpO2: 97% 95%     L drain removed without incident L dressing replaced Knee with mild effusion, no erythema or warmth Wiggles toes, distally NVI  Labs:  Recent Labs    04/02/20 1141 04/04/20 0518 04/05/20 0547  HGB 14.7 11.6* 10.8*   Recent Labs    04/04/20 0518 04/05/20 0547  WBC 8.1 6.5  RBC 3.52* 3.28*  HCT 35.3* 32.7*  PLT 346 289   Recent Labs    04/04/20 0518 04/05/20 0547  NA 136 136  K 4.2 3.7  CL 101 101  CO2 25 26  BUN 27* 22  CREATININE 0.91 0.82  GLUCOSE 157* 102*  CALCIUM 8.7* 8.4*    Assessment and Plan: 2 days s/p left knee arthroscopic washout Drain pulled Okay for WBAT, up with PT Cultures with no growth, once cultures speciate, may need ID to help manage IV abx in house and outpatient Will continue to follow over weekend Percocet for pain control, morphine for breakthough  VTE proph: lovenox, scds

## 2020-04-06 LAB — GLUCOSE, CAPILLARY
Glucose-Capillary: 94 mg/dL (ref 70–99)
Glucose-Capillary: 122 mg/dL — ABNORMAL HIGH (ref 70–99)
Glucose-Capillary: 125 mg/dL — ABNORMAL HIGH (ref 70–99)
Glucose-Capillary: 130 mg/dL — ABNORMAL HIGH (ref 70–99)

## 2020-04-06 LAB — COMPREHENSIVE METABOLIC PANEL
ALT: 30 U/L (ref 0–44)
AST: 22 U/L (ref 15–41)
Albumin: 2.2 g/dL — ABNORMAL LOW (ref 3.5–5.0)
Alkaline Phosphatase: 94 U/L (ref 38–126)
Anion gap: 9 (ref 5–15)
BUN: 18 mg/dL (ref 8–23)
CO2: 25 mmol/L (ref 22–32)
Calcium: 8.4 mg/dL — ABNORMAL LOW (ref 8.9–10.3)
Chloride: 99 mmol/L (ref 98–111)
Creatinine, Ser: 0.73 mg/dL (ref 0.61–1.24)
GFR calc Af Amer: >60 mL/min (ref >60)
GFR calc non Af Amer: >60 mL/min (ref >60)
Glucose, Bld: 109 mg/dL — ABNORMAL HIGH (ref 70–99)
Potassium: 3.6 mmol/L (ref 3.5–5.1)
Sodium: 133 mmol/L — ABNORMAL LOW (ref 135–145)
Total Bilirubin: 1.1 mg/dL (ref 0.3–1.2)
Total Protein: 5.5 g/dL — ABNORMAL LOW (ref 6.5–8.1)

## 2020-04-06 LAB — CBC WITH DIFFERENTIAL/PLATELET
Abs Immature Granulocytes: 0.05 10*3/uL (ref 0.00–0.07)
Basophils Absolute: 0 10*3/uL (ref 0.0–0.1)
Basophils Relative: 0 %
Eosinophils Absolute: 0 10*3/uL (ref 0.0–0.5)
Eosinophils Relative: 0 %
HCT: 33.1 % — ABNORMAL LOW (ref 39.0–52.0)
Hemoglobin: 10.9 g/dL — ABNORMAL LOW (ref 13.0–17.0)
Immature Granulocytes: 1 %
Lymphocytes Relative: 11 %
Lymphs Abs: 0.6 10*3/uL — ABNORMAL LOW (ref 0.7–4.0)
MCH: 32.5 pg (ref 26.0–34.0)
MCHC: 32.9 g/dL (ref 30.0–36.0)
MCV: 98.8 fL (ref 80.0–100.0)
Monocytes Absolute: 0.8 10*3/uL (ref 0.1–1.0)
Monocytes Relative: 14 %
Neutro Abs: 4.2 10*3/uL (ref 1.7–7.7)
Neutrophils Relative %: 74 %
Platelets: 260 10*3/uL (ref 150–400)
RBC: 3.35 MIL/uL — ABNORMAL LOW (ref 4.22–5.81)
RDW: 11.6 % (ref 11.5–15.5)
WBC: 5.7 10*3/uL (ref 4.0–10.5)
nRBC: 0 % (ref 0.0–0.2)

## 2020-04-06 LAB — BODY FLUID CULTURE

## 2020-04-06 MED ORDER — OXYCODONE HCL 5 MG PO TABS
5.0000 mg | ORAL_TABLET | Freq: Four times a day (QID) | ORAL | Status: DC
  Administered 2020-04-06: 10 mg via ORAL
  Administered 2020-04-07 (×3): 5 mg via ORAL
  Filled 2020-04-06: qty 1
  Filled 2020-04-06: qty 2
  Filled 2020-04-06 (×2): qty 1

## 2020-04-06 MED ORDER — SENNA 8.6 MG PO TABS
1.0000 | ORAL_TABLET | Freq: Every day | ORAL | Status: DC
  Administered 2020-04-06 – 2020-04-07 (×2): 8.6 mg via ORAL
  Filled 2020-04-06 (×2): qty 1

## 2020-04-06 NOTE — TOC Progression Note (Addendum)
Transition of Care [facility]) - Progression Note    Patient Details  Name: Jesse [name] MRN: [id number] Date of Birth: 02/14/1941  Transition of Care [facility]) CM/SW [place], [place] Phone Number: 04/06/2020, 11:01 AM  Clinical Narrative:   LCSW received return call back from daughter [name] on 04/06/20. Family was successfully provided bed offers. Family declined [facility]. She wishes to know [facility] current [facility] admission numbers. Family was advised to contact facility to inquire about this specific question. Family were disappointed that only two SNF's have accepted patient and do not wish to accept [facility] at this time for SNF placement as they are hopeful another SNF will soon accept patient.   LCSW checked SNF approvals later and saw that [facility] and [facility] had accepted patient for SNF. LCSW completed call to daughter but was unable to reach her. HIPPA compliant voice message was left which included this update on additional SNF approvals.   Expected Discharge Plan: Home/Self Care Barriers to Discharge: Continued Medical Work up  Expected Discharge Plan and Services Expected Discharge Plan: Home/Self Care   Discharge Planning Services: CM Consult   Living arrangements for the past 2 months: Single Family Home                 Readmission Risk Interventions Readmission Risk Prevention Plan 04/04/2020  Transportation Screening Complete  PCP or Specialist Appt within 3-5 Days Complete  HRI or [place] Complete  Social Work Consult for [place] Planning/Counseling Complete  Palliative Care Screening Not Applicable  Medication Review Press photographer) Referral to Pharmacy  Some recent data might be hidden   Eula Fried, [place], MSW, [email or web address]

## 2020-04-06 NOTE — TOC Progression Note (Signed)
Transition of Care [facility]) - Progression Note    Patient Details  Name: Jesse [name] MRN: [id number] Date of Birth: 05/10/1941  Transition of Care [facility]) CM/SW [place],  Phone Number: 04/06/2020, 9:38 AM  Clinical Narrative: LCSW completed outreach attempt to patient's daughter in order to provide bed offers. LCSW was update to reach family successfully. HIPPA compliant voice message left for family encouraging a return call back to Education officer, museum. [facility] have accepted patient for SNF thus far.    Expected Discharge Plan: Home/Self Care Barriers to Discharge: Continued Medical Work up  Expected Discharge Plan and Services Expected Discharge Plan: Home/Self Care   Discharge Planning Services: CM Consult   Living arrangements for the past 2 months: Single Family Home                   Readmission Risk Interventions Readmission Risk Prevention Plan 04/04/2020  Transportation Screening Complete  PCP or Specialist Appt within 3-5 Days Complete  HRI or [place] Complete  Social Work Consult for [place] Planning/Counseling Complete  Palliative Care Screening Not Applicable  Medication Review Press photographer) Referral to Pharmacy  Some recent data might be hidden   Eula Fried, [place], MSW, [email or web address]

## 2020-04-06 NOTE — Progress Notes (Signed)
° °  PATIENT ID: Jesse [name]   3 Days Post-Op Procedure(s) (LRB): LEFT KNEE ARTHROSCOPY  WASHOUT (Left)  Subjective: Reports no increased pain left knee. Has been up with PT, rec SNF and being arranged.   Objective:  Vitals:   04/05/20 2102 04/06/20 0458  BP: 131/65 134/69  Pulse: 74 71  Resp: 16 15  Temp: 98.1 F (36.7 C) 97.8 F (36.6 C)  SpO2: 100% 99%     K knee dressing c/d/i Wiggles toes, distally NVI  Labs:  Recent Labs    04/04/20 0518 04/05/20 0547 04/06/20 0611  HGB 11.6* 10.8* 10.9*   Recent Labs    04/05/20 0547 04/06/20 0611  WBC 6.5 5.7  RBC 3.28* 3.35*  HCT 32.7* 33.1*  PLT 289 260   Recent Labs    04/05/20 0547 04/06/20 0611  NA 136 133*  K 3.7 3.6  CL 101 99  CO2 26 25  BUN 22 18  CREATININE 0.82 0.73  GLUCOSE 102* 109*  CALCIUM 8.4* 8.4*    Assessment and Plan: 3 days s/p left knee arthroscopic washout Cultures- pseudomonas-  tx per hospitalist team, rec po Levaquin x 14-21 days Minimize narcotics for pain control- will d/c on norco and provide scripts D/c per primary team to snf when ready, we appreciate their management in this pleasant patient Fu with Dr. Tamera Punt in 1 week for wound check and suture removal  VTE proph: xalerto, scds

## 2020-04-06 NOTE — Progress Notes (Signed)
PROGRESS NOTE  Jesse [name]  EVO:[id number] DOB: Dec 17, 1940 DOA: 04/02/2020 PCP: Deland Pretty, MD   Brief Narrative:  79 year old white male CAD three-vessel CABG DES 11/29/2017 Dr. 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 Diabetes mellitus nuclear stress showed EF 45-50% 02/06/2020  Came to emergency room 03/25/2020 painful knee swelling persist with movement-underwent arthrocentesis no fluid could be aspirated felt this was a hematoma CT was performed showing no fracture-Home health services ordered patient was given some narcotics on discharge and told to follow-up with orthopedic doctor Erlinda Hong of Ortho who he has not seen yet He was given a knee immobilizer  Patient was then sent home and he was not given any specific steroids or anything to account for his hyperglycemia  Returns to ED 9/8 with multiple episodes of vomiting inability to eat for the past several days He states that sugars are usually well controlled in the 200 range found to be in DKA with AG=22, CO2 15 Potassium 5.0, sod 132  EKG benign showing paced rhythm, 2 view chest x-ray negative Secondary to cryptogenic source and usually well controlled glycemia-septic arthritis entertained versus hematoma of left lower extremity and orthopedics Dr. Tamera Punt consulted See below   Assessment & Plan:   Active Problems:   DKA (diabetic ketoacidoses) ([place])   1. DKA likely 2/2 septic arthritis a. Gap closed on 9/9 b. CBGs 94-1 22 continuing Lantus 10 units and reinitiated Metformin c. Hold Amaryl has only eating about 50% of meals-reinitiated on discharge d. He is insulin nave but will probably discharge him on low-dose Lantus and he will need teaching have asked nursing to do so 2. Septic knee status post knee washout 9/9 3. ?  Pseudomonas on culture-tells me he was working in the yard several weeks prior to that the swelling and a rock might of cut the skin of  his knee which would explain the source a. Patient had 45 cc purulent fluid-NGTD b. Washout showed purulence within the knee with significant debridement c. pain control per Ortho-Tylenol first choice-->OxyIR 5-10 given scheduled every 6 as his pain is not controlled d. Morphine on board if needed e. Transitioning to oral Levaquin 750 expect at least 14 days therapy which can be increased to 21 days if felt necessary 4. AKI on admission secondary to sepsis/DKA and prior to admission losartan a. Saline lock 9/11 b. Holding ARB as above 5. CAD status post three-vessel CABG in addition to last DES 11/29/2017 Dr. Einar Gip a. Losartan held and probably will reinitiate in the outpatient setting b. Continue metoprolol 25 twice daily 6. Mildly elevated LFTs a. Probably combination of sepsis physiology DKA b. Resolved likely secondary to sepsis 7. 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 On metoprolol, continue Xarelto 20 mg daily at this time 8. 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 DC all fluids  b. Will need outpatient follow-up with Dr. Einar Gip  DVT prophylaxis: Xarelto Code Status: Full Family Communication: None Disposition:   Status is: Inpatient  Remains inpatient appropriate because:Ongoing active pain requiring inpatient pain management and Ongoing diagnostic testing needed not appropriate for outpatient work up   Dispo: The patient is from: Home              Anticipated d/c is to: SNF              Anticipated d/c date is: 1 day  Patient currently is not medically stable to d/c.       Consultants:   Dr. Tamera Punt of orthopedics  Procedures: Aspiration of knee 9/9 Knee washout 9/9  Antimicrobials: Vancomycin   Subjective: Pain is 6/10 He just received Tylenol and is also receiving oxycodone He tells me he has been more active He has no fever Otherwise he is pretty well  Objective: Vitals:   04/05/20  1230 04/05/20 2102 04/06/20 0458 04/06/20 1302  BP: 127/72 131/65 134/69 136/87  Pulse: 72 74 71 72  Resp: 18 16 15 18   Temp: 98.2 F (36.8 C) 98.1 F (36.7 C) 97.8 F (36.6 C) 98.6 F (37 C)  TempSrc: Oral   Oral  SpO2: 100% 100% 99% 99%  Weight:      Height:        Intake/Output Summary (Last 24 hours) at 04/06/2020 1520 Last data filed at 04/06/2020 1300 Gross per 24 hour  Intake 780 ml  Output 1025 ml  Net -245 ml   Filed Weights   04/02/20 0757 04/02/20 2100  Weight: 86.2 kg 82.8 kg    Examination:   Coherent slightly tired appearing today EOMI NCAT no deficit Chest clear without rales or rhonchi S1-S2 sinus rhythm on monitors Left leg has Ace wrap with removed Hemovac and slight blood tinge on blankets  Data Reviewed: I have personally reviewed following labs and imaging studies  Sodium currently 135--> 133 BUNs/creatinine 27/1.4 on admit-->33/1.1-->27/0.9-->22/0.8 -->18/0.7 LFT resolved White count 5.7   Radiology Studies: No results found.   Scheduled Meds: . (feeding supplement) PROSource Plus  30 mL Oral BID BM  . acetaminophen  1,000 mg Oral Q6H  . dorzolamide-timolol  1 drop Both Eyes BID  . insulin aspart  0-5 Units Subcutaneous QHS  . insulin aspart  0-9 Units Subcutaneous TID WC  . insulin glargine  10 Units Subcutaneous QHS  . latanoprost  1 drop Both Eyes QHS  . levofloxacin  750 mg Oral Daily  . mouth rinse  15 mL Mouth Rinse BID  . metFORMIN  500 mg Oral QAC breakfast  . metoprolol tartrate  25 mg Oral BID  . multivitamin with minerals  1 tablet Oral Daily  . oxyCODONE  5-10 mg Oral Q6H  . polyethylene glycol  17 g Oral Daily  . rivaroxaban  20 mg Oral Q supper  . senna  1 tablet Oral Daily  . simvastatin  40 mg Oral Daily   Continuous Infusions:    LOS: 4 days    Time spent: [place], MD [facility] To contact the attending provider between 7A-7P or the covering provider during after hours 7P-7A,  please log into the web site [email or web address] and access using universal [place] password for that web site. If you do not have the password, please call the hospital operator.  04/06/2020, 3:20 PM

## 2020-04-07 DIAGNOSIS — M6259 Muscle wasting and atrophy, not elsewhere classified, multiple sites: Secondary | ICD-10-CM | POA: Diagnosis not present

## 2020-04-07 DIAGNOSIS — R2681 Unsteadiness on feet: Secondary | ICD-10-CM | POA: Diagnosis not present

## 2020-04-07 DIAGNOSIS — M6281 Muscle weakness (generalized): Secondary | ICD-10-CM | POA: Diagnosis not present

## 2020-04-07 DIAGNOSIS — R739 Hyperglycemia, unspecified: Secondary | ICD-10-CM | POA: Diagnosis not present

## 2020-04-07 DIAGNOSIS — E119 Type 2 diabetes mellitus without complications: Secondary | ICD-10-CM | POA: Diagnosis not present

## 2020-04-07 DIAGNOSIS — R2689 Other abnormalities of gait and mobility: Secondary | ICD-10-CM | POA: Diagnosis not present

## 2020-04-07 DIAGNOSIS — S81002A Unspecified open wound, left knee, initial encounter: Secondary | ICD-10-CM | POA: Diagnosis not present

## 2020-04-07 DIAGNOSIS — Z20828 Contact with and (suspected) exposure to other viral communicable diseases: Secondary | ICD-10-CM | POA: Diagnosis not present

## 2020-04-07 DIAGNOSIS — Z951 Presence of aortocoronary bypass graft: Secondary | ICD-10-CM | POA: Diagnosis not present

## 2020-04-07 DIAGNOSIS — R5381 Other malaise: Secondary | ICD-10-CM | POA: Diagnosis not present

## 2020-04-07 DIAGNOSIS — E782 Mixed hyperlipidemia: Secondary | ICD-10-CM | POA: Diagnosis not present

## 2020-04-07 DIAGNOSIS — I25810 Atherosclerosis of coronary artery bypass graft(s) without angina pectoris: Secondary | ICD-10-CM | POA: Diagnosis not present

## 2020-04-07 DIAGNOSIS — I495 Sick sinus syndrome: Secondary | ICD-10-CM | POA: Diagnosis not present

## 2020-04-07 DIAGNOSIS — N179 Acute kidney failure, unspecified: Secondary | ICD-10-CM | POA: Diagnosis not present

## 2020-04-07 DIAGNOSIS — M2550 Pain in unspecified joint: Secondary | ICD-10-CM | POA: Diagnosis not present

## 2020-04-07 DIAGNOSIS — M009 Pyogenic arthritis, unspecified: Secondary | ICD-10-CM | POA: Diagnosis not present

## 2020-04-07 DIAGNOSIS — N189 Chronic kidney disease, unspecified: Secondary | ICD-10-CM | POA: Diagnosis not present

## 2020-04-07 DIAGNOSIS — Z7401 Bed confinement status: Secondary | ICD-10-CM | POA: Diagnosis not present

## 2020-04-07 DIAGNOSIS — Z950 Presence of cardiac pacemaker: Secondary | ICD-10-CM | POA: Diagnosis not present

## 2020-04-07 DIAGNOSIS — I2510 Atherosclerotic heart disease of native coronary artery without angina pectoris: Secondary | ICD-10-CM | POA: Diagnosis not present

## 2020-04-07 DIAGNOSIS — I214 Non-ST elevation (NSTEMI) myocardial infarction: Secondary | ICD-10-CM | POA: Diagnosis not present

## 2020-04-07 DIAGNOSIS — M00262 Other streptococcal arthritis, left knee: Secondary | ICD-10-CM | POA: Diagnosis not present

## 2020-04-07 DIAGNOSIS — M0080 Arthritis due to other bacteria, unspecified joint: Secondary | ICD-10-CM | POA: Diagnosis not present

## 2020-04-07 DIAGNOSIS — H409 Unspecified glaucoma: Secondary | ICD-10-CM | POA: Diagnosis not present

## 2020-04-07 DIAGNOSIS — E1110 Type 2 diabetes mellitus with ketoacidosis without coma: Secondary | ICD-10-CM | POA: Diagnosis not present

## 2020-04-07 DIAGNOSIS — I10 Essential (primary) hypertension: Secondary | ICD-10-CM | POA: Diagnosis not present

## 2020-04-07 DIAGNOSIS — D649 Anemia, unspecified: Secondary | ICD-10-CM | POA: Diagnosis not present

## 2020-04-07 LAB — RENAL FUNCTION PANEL
Albumin: 2.2 g/dL — ABNORMAL LOW (ref 3.5–5.0)
Anion gap: 8 (ref 5–15)
BUN: 19 mg/dL (ref 8–23)
CO2: 25 mmol/L (ref 22–32)
Calcium: 8.6 mg/dL — ABNORMAL LOW (ref 8.9–10.3)
Chloride: 102 mmol/L (ref 98–111)
Creatinine, Ser: 0.78 mg/dL (ref 0.61–1.24)
GFR calc Af Amer: >60 mL/min (ref >60)
GFR calc non Af Amer: >60 mL/min (ref >60)
Glucose, Bld: 125 mg/dL — ABNORMAL HIGH (ref 70–99)
Phosphorus: 2.8 mg/dL (ref 2.5–4.6)
Potassium: 3.9 mmol/L (ref 3.5–5.1)
Sodium: 135 mmol/L (ref 135–145)

## 2020-04-07 LAB — SARS CORONAVIRUS 2 (TAT 6-24 HRS): SARS Coronavirus 2: NEGATIVE

## 2020-04-07 LAB — GLUCOSE, CAPILLARY
Glucose-Capillary: 100 mg/dL — ABNORMAL HIGH (ref 70–99)
Glucose-Capillary: 134 mg/dL — ABNORMAL HIGH (ref 70–99)
Glucose-Capillary: 113 mg/dL — ABNORMAL HIGH (ref 70–99)
Glucose-Capillary: 116 mg/dL — ABNORMAL HIGH (ref 70–99)

## 2020-04-07 MED ORDER — INFLUENZA VAC A&B SA ADJ QUAD 0.5 ML IM PRSY: 0.5000 mL | PREFILLED_SYRINGE | INTRAMUSCULAR | Status: DC

## 2020-04-07 MED ORDER — HYDROCODONE-ACETAMINOPHEN 5-325 MG PO TABS
1.0000 | ORAL_TABLET | Freq: Four times a day (QID) | ORAL | 0 refills | Status: DC | PRN
Start: 2020-04-07 — End: 2021-06-08

## 2020-04-07 MED ORDER — INSULIN GLARGINE 100 UNIT/ML ~~LOC~~ SOLN: 10.0000 [IU] | Freq: Every day | SUBCUTANEOUS | 11 refills | Status: DC

## 2020-04-07 MED ORDER — LEVOFLOXACIN 750 MG PO TABS
750.0000 mg | ORAL_TABLET | Freq: Every day | ORAL | 0 refills | Status: DC
Start: 2020-04-07 — End: 2020-06-23

## 2020-04-07 MED ORDER — POLYETHYLENE GLYCOL 3350 17 G PO PACK: 17.0000 g | PACK | Freq: Every day | ORAL | 0 refills | Status: DC

## 2020-04-07 MED ORDER — SENNA 8.6 MG PO TABS: 1.0000 | ORAL_TABLET | Freq: Every day | ORAL | 0 refills | Status: DC

## 2020-04-07 NOTE — Care Management Important Message (Signed)
Important Message  Patient Details IM Letter given to the Patient Name: Jesse [name] MRN: [id number] Date of Birth: [birth year]   Medicare Important Message Given:  Yes     Kerin Salen 04/07/2020, 12:01 PM

## 2020-04-07 NOTE — Discharge Instructions (Signed)
Discharge Instructions after Knee Arthroscopy ° ° °You will have a light dressing on your knee.  °Leave the dressing in place until the third day after your surgery and then remove it and place a band-aid over the stitches.  °After the bandage has been removed you may shower, but do not soak the incision. °You may begin gentle motion of your leg immediately after surgery. °Pump your foot up and down 20 times per hour, every hour you are awake.  °Apply ice to the knee 3 times per day for 30 minutes for the first 1 week until your knee is feeling comfortable again. Do not use heat.  °You may begin straight leg raising exercises (if you have a brace with it on). While lying down, pull your foot all the way up, tighten your quadriceps muscle and lift your heel off of the ground. Hold this position for 2 seconds, and then let the leg back down. Repeat the exercise 10 times, at least 3 times a day.  °Pain medicine has been prescribed for you.  °Use your medicine as needed over the first 48 hours, and then you can begin to taper your use. You may take Extra Strength Tylenol or Tylenol only in place of the pain pills.  °Take one 81mg aspirin daily for 3 weeks post-operatively unless it upsets your stomach. ° ° °Please call [phone] during normal business hours or [phone] after hours for any problems. Including the following: ° °- excessive redness of the incisions °- drainage for more than 4 days °- fever of more than 101.5 F ° °*Please note that pain medications will not be refilled after hours or on weekends. ° °  °

## 2020-04-07 NOTE — Progress Notes (Signed)
Physical Therapy Treatment Patient Details Name: Jesse [name] MRN: [id number] DOB: [birth year] Today's Date: 04/07/2020    History of Present Illness Patient is 79 y.o. male s/p Lt knee arthroscopic washout on 04/03/20. Patient initially presented to ED on 03/25/20 for painful knee and was discharge home as no acute findings were found. Pt returned to ED on 9/8 with vomiting, found to be in DKA, and was admitted for continued knee pain and concern for infection. Pt s/p LT knee arthroscopic I&D with washout on 04/03/20. PMH significant for CAD, CABGx3, CKD, HTN, SSS with PPM, DM.    PT Comments    Pt requiring at least mod assist for transfers at this time.  Pt with posterior bias requiring more assist when in standing.  Pt declined ambulating today due to pain, just medicated prior to arrival per pt.  Pt plans to d/c to SNF today.    Follow Up Recommendations  SNF;Supervision/Assistance - 24 hour     Equipment Recommendations  None recommended by PT    Recommendations for Other Services       Precautions / Restrictions Precautions Precautions: Fall Restrictions Weight Bearing Restrictions: No LLE Weight Bearing: Weight bearing as tolerated    Mobility  Bed Mobility Overal bed mobility: Needs Assistance Bed Mobility: Supine to Sit     Supine to sit: Min assist     General bed mobility comments: assist for L LE, pt utilized bed rails to self assist  Transfers Overall transfer level: Needs assistance Equipment used: Rolling walker (2 wheeled) Transfers: Sit to/from Stand Sit to Stand: Mod assist;From elevated surface Stand pivot transfers: Mod assist       General transfer comment: verbal cues for UE and LE positioning, pt required assist to rise and steady, presents with posterior bias in standing and transfer requiring assist  Ambulation/Gait             General Gait Details: declined due to pain   Stairs             Wheelchair Mobility    Modified  Rankin (Stroke Patients Only)       Balance Overall balance assessment: Needs assistance       Postural control: Posterior lean Standing balance support: Bilateral upper extremity supported;During functional activity Standing balance-Leahy Scale: Zero Standing balance comment: Reliant on BUE support and external assist for posterior bias                            Cognition Arousal/Alertness: Awake/alert Behavior During Therapy: WFL for tasks assessed/performed Overall Cognitive Status: Within Functional Limits for tasks assessed                                        Exercises      General Comments        Pertinent Vitals/Pain Pain Assessment: 0-10 Pain Score: 7  Pain Location: more pain in L knee with mobility, little pain at rest Pain Descriptors / Indicators: Guarding;Grimacing;Aching;Sore Pain Intervention(s): Repositioned;Premedicated before session    Home Living                      Prior Function            PT Goals (current goals can now be found in the care plan section) Progress towards PT goals: Progressing toward goals  Frequency    Min 3X/week      PT Plan Current plan remains appropriate    Co-evaluation              AM-PAC PT "6 Clicks" Mobility   Outcome Measure  Help needed turning from your back to your side while in a flat bed without using bedrails?: A Little Help needed moving from lying on your back to sitting on the side of a flat bed without using bedrails?: A Little Help needed moving to and from a bed to a chair (including a wheelchair)?: A Lot Help needed standing up from a chair using your arms (e.g., wheelchair or bedside chair)?: A Lot Help needed to walk in hospital room?: A Lot Help needed climbing 3-5 steps with a railing? : Total 6 Click Score: 13    End of Session Equipment Utilized During Treatment: Gait belt Activity Tolerance: Patient tolerated treatment  well;Patient limited by pain Patient left: in chair;with call bell/phone within reach;with chair alarm set;with family/visitor present Nurse Communication: Mobility status PT Visit Diagnosis: Unsteadiness on feet (R26.81);Muscle weakness (generalized) (M62.81);Pain Pain - Right/Left: Left Pain - part of body: Knee     Time: 7412-8786 PT Time Calculation (min) (ACUTE ONLY): 17 min  Charges:  $Therapeutic Activity: 8-22 mins                     Jannette Spanner PT, DPT Acute Rehabilitation Services Pager: [phone] Office: [phone]   Treysean Petruzzi,KATHrine E 04/07/2020, 11:22 AM

## 2020-04-07 NOTE — TOC Progression Note (Signed)
Transition of Care [facility]) - Progression Note    Patient Details  Name: Jesse [name] MRN: [id number] Date of Birth: Apr 25, 1941  Transition of Care [facility]) CM/SW Contact  Santita Hunsberger, Juliann Pulse, RN Phone Number: 04/07/2020, 11:13 AM  Clinical Narrative: confirmed Not [facility]-SNF [facility] rep [name] to get auth-confirmed w/family they want [facility]-spoke to [place] dtr/& [place] son.Await auth, & covid results. D/c summary already sent to [facility]-bed available after 2:30p-awaiting response from [facility] for auth.      Expected Discharge Plan: Skilled Nursing Facility Barriers to Discharge: Insurance Authorization  Expected Discharge Plan and Services Expected Discharge Plan: [place]   Discharge Planning Services: CM Consult   Living arrangements for the past 2 months: Single Family Home Expected Discharge Date: 04/07/20                                     Social Determinants of Health (SDOH) Interventions    Readmission Risk Interventions Readmission Risk Prevention Plan 04/04/2020  Transportation Screening Complete  PCP or Specialist Appt within 3-5 Days Complete  HRI or [place] Complete  Social Work Consult for [place] Planning/Counseling Complete  Palliative Care Screening Not Applicable  Medication Review Press photographer) Referral to Pharmacy  Some recent data might be hidden

## 2020-04-07 NOTE — Discharge Summary (Addendum)
Physician Discharge Summary  Jesse [name] QIH:[id number] DOB: 1940/09/20 DOA: 04/02/2020  PCP: Deland Pretty, MD  Admit date: 04/02/2020 Discharge date: 04/07/2020  Time spent: 37 minutes  Recommendations for Outpatient Follow-up:  1. Initiated Lantus this admission instead of Amaryl because of DKA-may be able to transition back to Amaryl in 1 or 2 weeks on discharge from facility 2. Needs Chem-12 CBC in 1 week needs LFT check Periodically secondary to a slightly elevated LFTs on admission-also if mild thrombocytopenia persists may need work-up for the same?  Platelet smear etc. 3. Needs to follow-up with Dr. Malena Catholic orthopedics office in 1 week for follow-up x-rays-please transport patient to this appointment from skilled facility 4. Had AKI on admission-ARB discontinued this admission please consider reinitiation as an outpatient versus alternative therapy 5. Recommend continuation of Levaquin for a total of 15 days please follow MAR instructions--If any concern for worsening may extend duration of antibiotics and defer this to orthopedics in the outpatient setting 6. Weightbearing as tolerated on discharge 7. Turn every 2 hours at facility as has stage I decubitus ulcer which was present on admission  Discharge Diagnoses:  Active Problems:   DKA (diabetic ketoacidoses) [facility])   Discharge Condition: Improved  Diet recommendation: Diabetic heart healthy  Filed Weights   04/02/20 0757 04/02/20 2100  Weight: 86.2 kg 82.8 kg    History of present illness:  79 year old white male CAD three-vessel CABG DES 11/29/2017 Dr. 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 Diabetes mellitus nuclear stress showed EF 45-50% 02/06/2020  Came to emergency room 03/25/2020 painful knee swelling persist with movement-underwent arthrocentesis no fluid could be aspirated felt this was a hematoma CT was performed showing no fracture-Home  health services ordered patient was given some narcotics on discharge and told to follow-up with orthopedic doctorXuof Orth[facility] he has not seen yet He was given a knee immobilizer  Patient was then sent home and he was not given any specific steroids or anything to account for his hyperglycemia  Returns to ED 9/8 with multiple episodes of vomiting inability to eatfor the past several days He states that sugars are usually well controlled in the 200 range found to be in DKA with AG=22, CO2 15 Potassium 5.0, sod 132  EKG benign showing paced rhythm, 2 view chest x-ray negative Secondary to cryptogenic source and usually well controlled glycemia-septic arthritis entertained versus hematoma of left lower extremity and orthopedics Dr. Tamera Punt consulted See below  Hospital Course:  1. DKA on admission secondary to septic arthritis now resolved a. Gap closed on 9/9 b. CBGs 94-1 22 continuing Lantus 10 units which was started this admission c. Re initiated Metformin d. Hold Amaryl has only eating about 50% of meals-reinitiated on discharge e. He is insulin nave but will probably discharge him on low-dose Lantus and he will need teaching have asked nursing to do so 2. Septic knee status post knee washout 9/9 3. ?  Pseudomonas on culture-tells me he was working in the yard several weeks prior to that the swelling and a rock might of cut the skin of his knee which would explain the source a. Patient had 45 cc purulent fluid-NGTD b. Washout showed purulence within the knee with significant debridement c. pain control per Ortho-Tylenol first choice-->OxyIR 5-10 given scheduled every 6 as his pain is not controlled d. Transitioning to oral Levaquin 750 expect at least 14 days therapy which can be increased to 21 days if felt  necessary by either orthopedics or skilled nursing facility MD 4. AKI on admission secondary to sepsis/DKA and prior to admission losartan a. Saline lock 9/11 b. Holding  ARB as above 5. CAD status post three-vessel CABG in addition to last DES 11/29/2017 Dr. Einar Gip a. Losartan held and probably will reinitiate in the outpatient setting b. Continue metoprolol 25 twice daily 6. Small stage I decubitus ulcer 1. Likely from laying in bed for long periods of time 2. Will resolve 3. Turn every 2 hours at facility 7. Mildly elevated LFTs a. Probably combination of sepsis physiology DKA b. Resolved likely secondary to sepsis 8. 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 On metoprolol, continue Xarelto 20 mg daily at this time 9. 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 DC all fluids  b. Will need outpatient follow-up with Dr. Einar Gip knee washout  Procedures:  Knee washout  Consultations:  Orthopedic surgery  Discharge Exam: Vitals:   04/06/20 2040 04/07/20 0451  BP: 131/87 126/61  Pulse: 83 69  Resp: 16 16  Temp: 97.9 F (36.6 C) (!) 97.5 F (36.4 C)  SpO2: 99% 100%    General: Awake alert coherent no distress sitting up in bed no pain tolerating diet EOMI NCAT no focal deficit Cardiovascular: S1-S2 no murmur rub or gallop Respiratory: Clinically clear no added sound no rales no rhonchi Left knee is wrapped with Kerlix he has good range of motion able to bend the same it is a little antalgic His feet are warm and well perfused Abdomen is soft nontender Neurologically he is grossly intact  Discharge Instructions   Discharge Instructions    Diet - low sodium heart healthy   Complete by: As directed    Increase activity slowly   Complete by: As directed    No wound care   Complete by: As directed    Weight bearing as tolerated   Complete by: As directed      Allergies as of 04/07/2020      Reactions   Brilinta [ticagrelor] Shortness Of Breath   Difficulty breathing       Medication List    STOP taking these medications   glimepiride 2 MG tablet Commonly known as: AMARYL   losartan  25 MG tablet Commonly known as: COZAAR     TAKE these medications   Biotin 5000 MCG Tabs Take 5,000 mcg by mouth daily.   dorzolamide-timolol 22.3-6.8 MG/ML ophthalmic solution Commonly known as: COSOPT Place 1 drop into both eyes 2 (two) times daily.   ezetimibe 10 MG tablet Commonly known as: ZETIA Take 1 tablet (10 mg total) by mouth daily after supper.   HYDROcodone-acetaminophen 5-325 MG tablet Commonly known as: Norco Take 1 tablet by mouth every 6 (six) hours as needed for moderate pain.   insulin glargine 100 UNIT/ML injection Commonly known as: LANTUS Inject 0.1 mLs (10 Units total) into the skin at bedtime.   latanoprost 0.005 % ophthalmic solution Commonly known as: XALATAN Place 1 drop into both eyes at bedtime.   levofloxacin 750 MG tablet Commonly known as: LEVAQUIN Take 1 tablet (750 mg total) by mouth daily.   melatonin 5 MG Tabs Take 5 mg by mouth at bedtime.   metFORMIN 500 MG 24 hr tablet Commonly known as: GLUCOPHAGE-XR Take 1 tablet (500 mg total) by mouth daily.   metoprolol tartrate 25 MG tablet Commonly known as: LOPRESSOR TAKE 1 TABLET BY MOUTH TWICE DAILY   nitroGLYCERIN 0.4 MG  SL tablet Commonly known as: NITROSTAT Place 1 tablet (0.4 mg total) under the tongue every 5 (five) minutes as needed for chest pain.   polyethylene glycol 17 g packet Commonly known as: MIRALAX / GLYCOLAX Take 17 g by mouth daily.   senna 8.6 MG Tabs tablet Commonly known as: SENOKOT Take 1 tablet (8.6 mg total) by mouth daily.   simvastatin 40 MG tablet Commonly known as: ZOCOR Take 40 mg by mouth daily.   traMADol 50 MG tablet Commonly known as: ULTRAM Take 1 tablet (50 mg total) by mouth every 6 (six) hours as needed for moderate pain.   True Metrix Blood Glucose Test test strip Generic drug: glucose blood TEST 3 TIMES D   vitamin B-12 1000 MCG tablet Commonly known as: CYANOCOBALAMIN Take 1,000 mcg by mouth daily.   Xarelto 20 MG Tabs  tablet Generic drug: rivaroxaban TAKE 1 TABLET(20 MG) BY MOUTH DAILY WITH SUPPER What changed: See the new instructions.            Discharge Care Instructions  (From admission, onward)         Start     Ordered   04/07/20 0000  Weight bearing as tolerated        04/07/20 0824         Allergies  Allergen Reactions  . Brilinta [Ticagrelor] Shortness Of Breath    Difficulty breathing     Follow-up Information    Malena Catholic, MD. Schedule an appointment as soon as possible for a visit in 1 week(s).   Contact information: [address] [place] [phone]                The results of significant diagnostics from this hospitalization (including imaging, microbiology, ancillary and laboratory) are listed below for reference.    Significant Diagnostic Studies: DG Chest 2 View  Result Date: 04/02/2020 CLINICAL DATA:  Nausea, vomiting EXAM: CHEST - 2 VIEW COMPARISON:  01/18/2020 FINDINGS: Left pacer remains in place, unchanged. Heart is normal size. No confluent opacities or effusions. No acute bony abnormality. IMPRESSION: No active cardiopulmonary disease. Electronically Signed   By: Rolm Baptise M.D.   On: 04/02/2020 20:42   CT Knee Left Wo Contrast  Result Date: 03/25/2020 CLINICAL DATA:  Left knee pain after injury 1 week ago EXAM: CT OF THE LEFT KNEE WITHOUT CONTRAST TECHNIQUE: Multidetector CT imaging of the left knee was performed according to the standard protocol. Multiplanar CT image reconstructions were also generated. COMPARISON:  X-ray 03/25/2020 FINDINGS: Bones/Joint/Cartilage No acute fracture. No dislocation. Osseous fragmentation at the tibial tubercle with well corticated ossification within the distal patellar tendon. Joint spaces are relatively well preserved. Mild marginal osteophytosis within the patellofemoral compartment. Subcortical cystic changes within the periphery of the medial tibial plateau. There is a moderate to large sized  knee joint effusion without fat-fluid level. Moderate-sized Baker's cyst measuring up to 7.1 cm in length. Ligaments Suboptimally assessed by CT. Soft tissue edema adjacent to the medial collateral ligament (series 7, image 50). Muscles and Tendons Fatty atrophy of the medial head of the gastrocnemius muscle. Tendinous structures appear intact. Soft tissues Mild subcutaneous edema the posteromedial aspect of the knee. IMPRESSION: 1. No acute fracture or dislocation of the left knee. Well-corticated ossification within the distal patellar tendon compatible with either remote trauma or chronic tendinopathy. 2. Moderate to large sized knee joint effusion, nonspecific. 3. Moderate-sized Baker's cyst. 4. Soft tissue edema adjacent to the medial collateral ligament, which may reflect an  MCL sprain. 5. Fatty atrophy of the medial head of the gastrocnemius muscle. Electronically Signed   By: Davina Poke D.O.   On: 03/25/2020 11:10   DG Knee Complete 4 Views Left  Result Date: 03/25/2020 CLINICAL DATA:  Left knee pain after injury that occurred 1 week ago. EXAM: LEFT KNEE - COMPLETE 4+ VIEW COMPARISON:  None. FINDINGS: Corticated lucency through the base of a large osteophyte arising form the tibial tubercle, which may represent an avulsion fracture. No malalignment. Moderate to large joint effusion. Mild multi compartment degenerative changes, greatest in the medial and patellofemoral compartments. Patellar enthesophytes. Soft tissue swelling about the medial knee. Vascular calcifications. IMPRESSION: 1. Lucency through base of the tibial tubercle, which may represent an avulsion fracture. The margins of the lucency are corticated, suggesting it may be remote. Recommend correlation with the presence or absence of point tenderness. 2. Moderate to large joint effusion. Electronically Signed   By: Margaretha Sheffield MD   On: 03/25/2020 08:55    Microbiology: Recent Results (from the past 240 hour(s))  SARS  Coronavirus 2 by RT PCR (hospital order, performed in [facility] hospital lab) Nasopharyngeal Nasopharyngeal Swab     Status: None   Collection Time: 04/02/20  4:03 PM   Specimen: Nasopharyngeal Swab  Result Value Ref Range Status   SARS Coronavirus 2 NEGATIVE NEGATIVE Final    Comment: (NOTE) SARS-CoV-2 target nucleic acids are NOT DETECTED.  The SARS-CoV-2 RNA is generally detectable in upper and lower respiratory specimens during the acute phase of infection. The lowest concentration of SARS-CoV-2 viral copies this assay can detect is 250 copies / mL. A negative result does not preclude SARS-CoV-2 infection and should not be used as the sole basis for treatment or other patient management decisions.  A negative result may occur with improper specimen collection / handling, submission of specimen other than nasopharyngeal swab, presence of viral mutation(s) within the areas targeted by this assay, and inadequate number of viral copies (<250 copies / mL). A negative result must be combined with clinical observations, patient history, and epidemiological information.  Fact Sheet for Patients:   StrictlyIdeas.no  Fact Sheet for Healthcare Providers: BankingDealers.co.za  This test is not yet approved or  cleared by the [place] FDA and has been authorized for detection and/or diagnosis of SARS-CoV-2 by FDA under an Emergency Use Authorization (EUA).  This EUA will remain in effect (meaning this test can be used) for the duration of the COVID-19 declaration under Section 564(b)(1) of the Act, 21 U.S.C. section 360bbb-3(b)(1), unless the authorization is terminated or revoked sooner.  Performed at [facility], [place] [address] [place], [place] [address]   MRSA PCR Screening     Status: None   Collection Time: 04/03/20  6:36 AM   Specimen: Nasopharyngeal Wash  Result Value Ref Range Status   MRSA by PCR  NEGATIVE NEGATIVE Final    Comment:        The GeneXpert MRSA Assay (FDA approved for NASAL specimens only), is one component of a comprehensive MRSA colonization surveillance program. It is not intended to diagnose MRSA infection nor to guide or monitor treatment for MRSA infections. Performed at [facility], [place] [address], [place], [place] [address]   Body fluid culture     Status: None   Collection Time: 04/03/20  9:25 AM   Specimen: Synovium; Body Fluid  Result Value Ref Range Status   Specimen Description   Final    SYNOVIAL  LT KNEE Performed at [facility], [place] [address] [place], [place] [address]    Special Requests   Final    Immunocompromised Performed at [facility], [place] [address], [place], [place] [address]    Gram Stain   Final    ABUNDANT WBC PRESENT,BOTH PMN AND MONONUCLEAR NO ORGANISMS SEEN Gram Stain Report Called to,Read Back By and Verified With: CARTER,K @ 8177 ON [id number] BY POTEAT,S Performed at [facility], [place] [address] [place], [place] [address]    Culture   Final    RARE PSEUDOMONAS AERUGINOSA CRITICAL RESULT CALLED TO, READ BACK BY AND VERIFIED WITH: I. HABIB RN, AT 1020 04/05/20 BY D. VANHOOK Performed at [facility], [place] [address] [place], [place] [address]    Report Status 04/06/2020 FINAL  Final   Organism ID, Bacteria PSEUDOMONAS AERUGINOSA  Final      Susceptibility   Pseudomonas aeruginosa - MIC*    CEFTAZIDIME 2 SENSITIVE Sensitive     CIPROFLOXACIN <=0.25 SENSITIVE Sensitive     GENTAMICIN <=1 SENSITIVE Sensitive     IMIPENEM 2 SENSITIVE Sensitive     PIP/TAZO 16 SENSITIVE Sensitive     CEFEPIME 2 SENSITIVE Sensitive     * RARE PSEUDOMONAS AERUGINOSA     Labs: Basic Metabolic Panel: Recent Labs  Lab 04/03/20 0436 04/04/20 0518 04/05/20 0547 04/06/20 0611 04/07/20 0511  NA 135 136 136 133* 135  K 4.1 4.2 3.7 3.6 3.9  CL 99 101 101 99  102  CO2 26 25 26 25 25   GLUCOSE 147* 157* 102* 109* 125*  BUN 33* 27* 22 18 19   CREATININE 1.18 0.91 0.82 0.73 0.78  CALCIUM 9.2 8.7* 8.4* 8.4* 8.6*  PHOS  --   --   --   --  2.8   Liver Function Tests: Recent Labs  Lab 04/02/20 1141 04/02/20 1141 04/03/20 0436 04/04/20 0518 04/05/20 0547 04/06/20 0611 04/07/20 0511  AST 64*  --  37 32 24 22  --   ALT 78*  --  59* 45* 34 30  --   ALKPHOS 127*  --  109 90 90 94  --   BILITOT 3.9*  --  2.1* 1.8* 1.5* 1.1  --   PROT 7.2  --  6.3* 5.4* 5.0* 5.5*  --   ALBUMIN 3.0*   < > 2.5* 2.3* 2.1* 2.2* 2.2*   < > = values in this interval not displayed.   Recent Labs  Lab 04/02/20 1141  LIPASE 37   No results for input(s): AMMONIA in the last 168 hours. CBC: Recent Labs  Lab 04/02/20 1141 04/04/20 0518 04/05/20 0547 04/06/20 0611  WBC 9.0 8.1 6.5 5.7  NEUTROABS  --  6.3 5.0 4.2  HGB 14.7 11.6* 10.8* 10.9*  HCT 44.0 35.3* 32.7* 33.1*  MCV 99.5 100.3* 99.7 98.8  PLT 455* 346 289 260   Cardiac Enzymes: No results for input(s): CKTOTAL, CKMB, CKMBINDEX, TROPONINI in the last 168 hours. BNP: BNP (last 3 results) No results for input(s): BNP in the last 8760 hours.  ProBNP (last 3 results) No results for input(s): PROBNP in the last 8760 hours.  CBG: Recent Labs  Lab 04/06/20 0810 04/06/20 1142 04/06/20 1647 04/06/20 2038 04/07/20 0738  GLUCAP 94 122* 125* 130* 100*       Signed:  Nita Sells MD   [facility] 04/07/2020, 10:16 AM

## 2020-04-07 NOTE — Progress Notes (Signed)
Patient awaiting transport to facility. PIV removed. AVS reviewed with patient, in packet for RN at facility. Education completed, all questions answered.   Gwendlyn Deutscher, RN

## 2020-04-07 NOTE — Progress Notes (Signed)
   PATIENT ID: Jesse [name]   4 Days Post-Op Procedure(s) (LRB): LEFT KNEE ARTHROSCOPY  WASHOUT (Left)  Subjective: Notes left knee pain is improving. Getting up with PT. Planning on d/c to SNF.   Objective:  Vitals:   04/06/20 2040 04/07/20 0451  BP: 131/87 126/61  Pulse: 83 69  Resp: 16 16  Temp: 97.9 F (36.6 C) (!) 97.5 F (36.4 C)  SpO2: 99% 100%     L knee ace and dressing c/d/i Wiggles toes, distally NVI  Labs:  Recent Labs    04/05/20 0547 04/06/20 0611  HGB 10.8* 10.9*   Recent Labs    04/05/20 0547 04/06/20 0611  WBC 6.5 5.7  RBC 3.28* 3.35*  HCT 32.7* 33.1*  PLT 289 260   Recent Labs    04/06/20 0611 04/07/20 0511  NA 133* 135  K 3.6 3.9  CL 99 102  CO2 25 25  BUN 18 19  CREATININE 0.73 0.78  GLUCOSE 109* 125*  CALCIUM 8.4* 8.6*    Assessment and Plan: 4 days s/p left knee arthroscopic washout Cultures- pseudomonas-  tx per hospitalist team, rec po Levaquin x 14-21 days Minimize narcotics for pain control- will d/c on norco and provide scripts D/c per primary team to snf when ready, we appreciate their management in this pleasant patient Fu with Dr. Tamera Punt in 1 week for wound check and suture removal  VTE proph: xalerto, scds

## 2020-04-07 NOTE — TOC Transition Note (Signed)
Transition of Care [facility]) - CM/SW Discharge Note   Patient Details  Name: Jesse [name] MRN: [id number] Date of Birth: 05-23-1941  [facility] of [facility] CM/SW Contact:  Dessa Phi, RN Phone Number: 04/07/2020, 3:05 PM   Clinical Narrative:  D/c [facility] [name] provided rm#[id number],tel# for nsg to give report [phone].PTAR called but on hold till coivd results in-Nsg to fax covid results to [facility]. Nsg aware.     Final next level of care: Skilled Nursing Facility Barriers to Discharge: No Barriers Identified   Patient Goals and CMS Choice Patient states their goals for this hospitalization and ongoing recovery are:: to go home CMS Medicare.gov Compare Post Acute Care list provided to:: Patient    Discharge Placement              Patient [facility] bed at: [facility] Patient to be transferred to facility by: [place] Name of family member notified: Lucy dtr [phone] Patient and family notified of of transfer: 04/07/20  Discharge Plan and Services   Discharge Planning Services: CM Consult                                 Social Determinants of Health ([place]) Interventions     Readmission Risk Interventions Readmission Risk Prevention Plan 04/04/2020  Transportation Screening Complete  PCP or Specialist Appt within 3-5 Days Complete  HRI or [place] Complete  Social Work Consult for [place] Planning/Counseling Complete  Palliative Care Screening Not Applicable  Medication Review [employer]) Referral to Pharmacy  Some recent data might be hidden

## 2020-04-08 DIAGNOSIS — I495 Sick sinus syndrome: Secondary | ICD-10-CM | POA: Diagnosis not present

## 2020-04-08 DIAGNOSIS — I10 Essential (primary) hypertension: Secondary | ICD-10-CM | POA: Diagnosis not present

## 2020-04-08 DIAGNOSIS — M0080 Arthritis due to other bacteria, unspecified joint: Secondary | ICD-10-CM | POA: Diagnosis not present

## 2020-04-08 DIAGNOSIS — N189 Chronic kidney disease, unspecified: Secondary | ICD-10-CM | POA: Diagnosis not present

## 2020-04-08 DIAGNOSIS — D649 Anemia, unspecified: Secondary | ICD-10-CM | POA: Diagnosis not present

## 2020-04-08 DIAGNOSIS — H409 Unspecified glaucoma: Secondary | ICD-10-CM | POA: Diagnosis not present

## 2020-04-08 DIAGNOSIS — I25810 Atherosclerosis of coronary artery bypass graft(s) without angina pectoris: Secondary | ICD-10-CM | POA: Diagnosis not present

## 2020-04-08 DIAGNOSIS — E782 Mixed hyperlipidemia: Secondary | ICD-10-CM | POA: Diagnosis not present

## 2020-04-08 DIAGNOSIS — E119 Type 2 diabetes mellitus without complications: Secondary | ICD-10-CM | POA: Diagnosis not present

## 2020-04-09 DIAGNOSIS — M009 Pyogenic arthritis, unspecified: Secondary | ICD-10-CM | POA: Diagnosis not present

## 2020-04-09 DIAGNOSIS — N179 Acute kidney failure, unspecified: Secondary | ICD-10-CM | POA: Diagnosis not present

## 2020-04-09 DIAGNOSIS — E1110 Type 2 diabetes mellitus with ketoacidosis without coma: Secondary | ICD-10-CM | POA: Diagnosis not present

## 2020-04-09 DIAGNOSIS — I2510 Atherosclerotic heart disease of native coronary artery without angina pectoris: Secondary | ICD-10-CM | POA: Diagnosis not present

## 2020-04-10 DIAGNOSIS — S81002A Unspecified open wound, left knee, initial encounter: Secondary | ICD-10-CM | POA: Diagnosis not present

## 2020-04-10 DIAGNOSIS — Z20828 Contact with and (suspected) exposure to other viral communicable diseases: Secondary | ICD-10-CM | POA: Diagnosis not present

## 2020-04-14 DIAGNOSIS — Z20828 Contact with and (suspected) exposure to other viral communicable diseases: Secondary | ICD-10-CM | POA: Diagnosis not present

## 2020-04-16 DIAGNOSIS — S81002A Unspecified open wound, left knee, initial encounter: Secondary | ICD-10-CM | POA: Diagnosis not present

## 2020-04-17 DIAGNOSIS — Z20828 Contact with and (suspected) exposure to other viral communicable diseases: Secondary | ICD-10-CM | POA: Diagnosis not present

## 2020-04-20 DIAGNOSIS — Z20828 Contact with and (suspected) exposure to other viral communicable diseases: Secondary | ICD-10-CM | POA: Diagnosis not present

## 2020-04-23 DIAGNOSIS — Z20828 Contact with and (suspected) exposure to other viral communicable diseases: Secondary | ICD-10-CM | POA: Diagnosis not present

## 2020-04-25 DIAGNOSIS — I10 Essential (primary) hypertension: Secondary | ICD-10-CM | POA: Diagnosis not present

## 2020-04-25 DIAGNOSIS — M0080 Arthritis due to other bacteria, unspecified joint: Secondary | ICD-10-CM | POA: Diagnosis not present

## 2020-04-25 DIAGNOSIS — E782 Mixed hyperlipidemia: Secondary | ICD-10-CM | POA: Diagnosis not present

## 2020-04-25 DIAGNOSIS — D649 Anemia, unspecified: Secondary | ICD-10-CM | POA: Diagnosis not present

## 2020-04-25 DIAGNOSIS — I2510 Atherosclerotic heart disease of native coronary artery without angina pectoris: Secondary | ICD-10-CM | POA: Diagnosis not present

## 2020-04-25 DIAGNOSIS — E119 Type 2 diabetes mellitus without complications: Secondary | ICD-10-CM | POA: Diagnosis not present

## 2020-04-27 DIAGNOSIS — Z20828 Contact with and (suspected) exposure to other viral communicable diseases: Secondary | ICD-10-CM | POA: Diagnosis not present

## 2020-04-30 ENCOUNTER — Other Ambulatory Visit: Payer: Self-pay | Admitting: Cardiology

## 2020-05-04 DIAGNOSIS — Z20828 Contact with and (suspected) exposure to other viral communicable diseases: Secondary | ICD-10-CM | POA: Diagnosis not present

## 2020-05-08 DIAGNOSIS — I2510 Atherosclerotic heart disease of native coronary artery without angina pectoris: Secondary | ICD-10-CM | POA: Diagnosis not present

## 2020-05-08 DIAGNOSIS — H409 Unspecified glaucoma: Secondary | ICD-10-CM | POA: Diagnosis not present

## 2020-05-08 DIAGNOSIS — Z20828 Contact with and (suspected) exposure to other viral communicable diseases: Secondary | ICD-10-CM | POA: Diagnosis not present

## 2020-05-08 DIAGNOSIS — D649 Anemia, unspecified: Secondary | ICD-10-CM | POA: Diagnosis not present

## 2020-05-08 DIAGNOSIS — M0080 Arthritis due to other bacteria, unspecified joint: Secondary | ICD-10-CM | POA: Diagnosis not present

## 2020-05-08 DIAGNOSIS — I10 Essential (primary) hypertension: Secondary | ICD-10-CM | POA: Diagnosis not present

## 2020-05-08 DIAGNOSIS — E782 Mixed hyperlipidemia: Secondary | ICD-10-CM | POA: Diagnosis not present

## 2020-05-08 DIAGNOSIS — E119 Type 2 diabetes mellitus without complications: Secondary | ICD-10-CM | POA: Diagnosis not present

## 2020-05-08 DIAGNOSIS — N189 Chronic kidney disease, unspecified: Secondary | ICD-10-CM | POA: Diagnosis not present

## 2020-05-12 DIAGNOSIS — Z20828 Contact with and (suspected) exposure to other viral communicable diseases: Secondary | ICD-10-CM | POA: Diagnosis not present

## 2020-05-14 DIAGNOSIS — I2510 Atherosclerotic heart disease of native coronary artery without angina pectoris: Secondary | ICD-10-CM | POA: Diagnosis not present

## 2020-05-14 DIAGNOSIS — N189 Chronic kidney disease, unspecified: Secondary | ICD-10-CM | POA: Diagnosis not present

## 2020-05-14 DIAGNOSIS — H409 Unspecified glaucoma: Secondary | ICD-10-CM | POA: Diagnosis not present

## 2020-05-14 DIAGNOSIS — I495 Sick sinus syndrome: Secondary | ICD-10-CM | POA: Diagnosis not present

## 2020-05-14 DIAGNOSIS — M0080 Arthritis due to other bacteria, unspecified joint: Secondary | ICD-10-CM | POA: Diagnosis not present

## 2020-05-14 DIAGNOSIS — I25810 Atherosclerosis of coronary artery bypass graft(s) without angina pectoris: Secondary | ICD-10-CM | POA: Diagnosis not present

## 2020-05-14 DIAGNOSIS — I10 Essential (primary) hypertension: Secondary | ICD-10-CM | POA: Diagnosis not present

## 2020-05-14 DIAGNOSIS — D649 Anemia, unspecified: Secondary | ICD-10-CM | POA: Diagnosis not present

## 2020-05-14 DIAGNOSIS — E119 Type 2 diabetes mellitus without complications: Secondary | ICD-10-CM | POA: Diagnosis not present

## 2020-05-15 DIAGNOSIS — M6259 Muscle wasting and atrophy, not elsewhere classified, multiple sites: Secondary | ICD-10-CM | POA: Diagnosis not present

## 2020-05-15 DIAGNOSIS — M6281 Muscle weakness (generalized): Secondary | ICD-10-CM | POA: Diagnosis not present

## 2020-05-15 DIAGNOSIS — I214 Non-ST elevation (NSTEMI) myocardial infarction: Secondary | ICD-10-CM | POA: Diagnosis not present

## 2020-05-15 DIAGNOSIS — M00262 Other streptococcal arthritis, left knee: Secondary | ICD-10-CM | POA: Diagnosis not present

## 2020-05-15 DIAGNOSIS — R2681 Unsteadiness on feet: Secondary | ICD-10-CM | POA: Diagnosis not present

## 2020-05-15 DIAGNOSIS — Z20828 Contact with and (suspected) exposure to other viral communicable diseases: Secondary | ICD-10-CM | POA: Diagnosis not present

## 2020-05-17 DIAGNOSIS — M009 Pyogenic arthritis, unspecified: Secondary | ICD-10-CM | POA: Diagnosis not present

## 2020-05-17 DIAGNOSIS — E119 Type 2 diabetes mellitus without complications: Secondary | ICD-10-CM | POA: Diagnosis not present

## 2020-05-17 DIAGNOSIS — Z950 Presence of cardiac pacemaker: Secondary | ICD-10-CM | POA: Diagnosis not present

## 2020-05-17 DIAGNOSIS — Z7901 Long term (current) use of anticoagulants: Secondary | ICD-10-CM | POA: Diagnosis not present

## 2020-05-17 DIAGNOSIS — H409 Unspecified glaucoma: Secondary | ICD-10-CM | POA: Diagnosis not present

## 2020-05-17 DIAGNOSIS — I495 Sick sinus syndrome: Secondary | ICD-10-CM | POA: Diagnosis not present

## 2020-05-17 DIAGNOSIS — Z7984 Long term (current) use of oral hypoglycemic drugs: Secondary | ICD-10-CM | POA: Diagnosis not present

## 2020-05-23 DIAGNOSIS — I495 Sick sinus syndrome: Secondary | ICD-10-CM | POA: Diagnosis not present

## 2020-05-23 DIAGNOSIS — E119 Type 2 diabetes mellitus without complications: Secondary | ICD-10-CM | POA: Diagnosis not present

## 2020-05-23 DIAGNOSIS — M009 Pyogenic arthritis, unspecified: Secondary | ICD-10-CM | POA: Diagnosis not present

## 2020-05-23 DIAGNOSIS — Z950 Presence of cardiac pacemaker: Secondary | ICD-10-CM | POA: Diagnosis not present

## 2020-05-23 DIAGNOSIS — H409 Unspecified glaucoma: Secondary | ICD-10-CM | POA: Diagnosis not present

## 2020-05-23 DIAGNOSIS — Z7984 Long term (current) use of oral hypoglycemic drugs: Secondary | ICD-10-CM | POA: Diagnosis not present

## 2020-05-23 DIAGNOSIS — Z7901 Long term (current) use of anticoagulants: Secondary | ICD-10-CM | POA: Diagnosis not present

## 2020-05-24 DIAGNOSIS — Z950 Presence of cardiac pacemaker: Secondary | ICD-10-CM | POA: Diagnosis not present

## 2020-05-24 DIAGNOSIS — E119 Type 2 diabetes mellitus without complications: Secondary | ICD-10-CM | POA: Diagnosis not present

## 2020-05-24 DIAGNOSIS — Z7901 Long term (current) use of anticoagulants: Secondary | ICD-10-CM | POA: Diagnosis not present

## 2020-05-24 DIAGNOSIS — I495 Sick sinus syndrome: Secondary | ICD-10-CM | POA: Diagnosis not present

## 2020-05-24 DIAGNOSIS — Z7984 Long term (current) use of oral hypoglycemic drugs: Secondary | ICD-10-CM | POA: Diagnosis not present

## 2020-05-24 DIAGNOSIS — H409 Unspecified glaucoma: Secondary | ICD-10-CM | POA: Diagnosis not present

## 2020-05-24 DIAGNOSIS — M009 Pyogenic arthritis, unspecified: Secondary | ICD-10-CM | POA: Diagnosis not present

## 2020-05-28 DIAGNOSIS — Z7901 Long term (current) use of anticoagulants: Secondary | ICD-10-CM | POA: Diagnosis not present

## 2020-05-28 DIAGNOSIS — I495 Sick sinus syndrome: Secondary | ICD-10-CM | POA: Diagnosis not present

## 2020-05-28 DIAGNOSIS — E119 Type 2 diabetes mellitus without complications: Secondary | ICD-10-CM | POA: Diagnosis not present

## 2020-05-28 DIAGNOSIS — H409 Unspecified glaucoma: Secondary | ICD-10-CM | POA: Diagnosis not present

## 2020-05-28 DIAGNOSIS — M009 Pyogenic arthritis, unspecified: Secondary | ICD-10-CM | POA: Diagnosis not present

## 2020-05-28 DIAGNOSIS — Z7984 Long term (current) use of oral hypoglycemic drugs: Secondary | ICD-10-CM | POA: Diagnosis not present

## 2020-05-28 DIAGNOSIS — Z950 Presence of cardiac pacemaker: Secondary | ICD-10-CM | POA: Diagnosis not present

## 2020-05-29 DIAGNOSIS — Z950 Presence of cardiac pacemaker: Secondary | ICD-10-CM | POA: Diagnosis not present

## 2020-05-29 DIAGNOSIS — E119 Type 2 diabetes mellitus without complications: Secondary | ICD-10-CM | POA: Diagnosis not present

## 2020-05-29 DIAGNOSIS — Z7984 Long term (current) use of oral hypoglycemic drugs: Secondary | ICD-10-CM | POA: Diagnosis not present

## 2020-05-29 DIAGNOSIS — M009 Pyogenic arthritis, unspecified: Secondary | ICD-10-CM | POA: Diagnosis not present

## 2020-05-29 DIAGNOSIS — H409 Unspecified glaucoma: Secondary | ICD-10-CM | POA: Diagnosis not present

## 2020-05-29 DIAGNOSIS — I495 Sick sinus syndrome: Secondary | ICD-10-CM | POA: Diagnosis not present

## 2020-05-29 DIAGNOSIS — Z7901 Long term (current) use of anticoagulants: Secondary | ICD-10-CM | POA: Diagnosis not present

## 2020-05-30 DIAGNOSIS — Z950 Presence of cardiac pacemaker: Secondary | ICD-10-CM | POA: Diagnosis not present

## 2020-05-30 DIAGNOSIS — Z7901 Long term (current) use of anticoagulants: Secondary | ICD-10-CM | POA: Diagnosis not present

## 2020-05-30 DIAGNOSIS — Z7984 Long term (current) use of oral hypoglycemic drugs: Secondary | ICD-10-CM | POA: Diagnosis not present

## 2020-05-30 DIAGNOSIS — I495 Sick sinus syndrome: Secondary | ICD-10-CM | POA: Diagnosis not present

## 2020-05-30 DIAGNOSIS — E119 Type 2 diabetes mellitus without complications: Secondary | ICD-10-CM | POA: Diagnosis not present

## 2020-05-30 DIAGNOSIS — M009 Pyogenic arthritis, unspecified: Secondary | ICD-10-CM | POA: Diagnosis not present

## 2020-05-30 DIAGNOSIS — H409 Unspecified glaucoma: Secondary | ICD-10-CM | POA: Diagnosis not present

## 2020-06-02 DIAGNOSIS — Z7901 Long term (current) use of anticoagulants: Secondary | ICD-10-CM | POA: Diagnosis not present

## 2020-06-02 DIAGNOSIS — I495 Sick sinus syndrome: Secondary | ICD-10-CM | POA: Diagnosis not present

## 2020-06-02 DIAGNOSIS — Z950 Presence of cardiac pacemaker: Secondary | ICD-10-CM | POA: Diagnosis not present

## 2020-06-02 DIAGNOSIS — E119 Type 2 diabetes mellitus without complications: Secondary | ICD-10-CM | POA: Diagnosis not present

## 2020-06-02 DIAGNOSIS — Z7984 Long term (current) use of oral hypoglycemic drugs: Secondary | ICD-10-CM | POA: Diagnosis not present

## 2020-06-02 DIAGNOSIS — M009 Pyogenic arthritis, unspecified: Secondary | ICD-10-CM | POA: Diagnosis not present

## 2020-06-02 DIAGNOSIS — H409 Unspecified glaucoma: Secondary | ICD-10-CM | POA: Diagnosis not present

## 2020-06-04 DIAGNOSIS — Z950 Presence of cardiac pacemaker: Secondary | ICD-10-CM | POA: Diagnosis not present

## 2020-06-04 DIAGNOSIS — H409 Unspecified glaucoma: Secondary | ICD-10-CM | POA: Diagnosis not present

## 2020-06-04 DIAGNOSIS — E119 Type 2 diabetes mellitus without complications: Secondary | ICD-10-CM | POA: Diagnosis not present

## 2020-06-04 DIAGNOSIS — I495 Sick sinus syndrome: Secondary | ICD-10-CM | POA: Diagnosis not present

## 2020-06-04 DIAGNOSIS — Z7984 Long term (current) use of oral hypoglycemic drugs: Secondary | ICD-10-CM | POA: Diagnosis not present

## 2020-06-04 DIAGNOSIS — M009 Pyogenic arthritis, unspecified: Secondary | ICD-10-CM | POA: Diagnosis not present

## 2020-06-04 DIAGNOSIS — Z7901 Long term (current) use of anticoagulants: Secondary | ICD-10-CM | POA: Diagnosis not present

## 2020-06-09 DIAGNOSIS — E119 Type 2 diabetes mellitus without complications: Secondary | ICD-10-CM | POA: Diagnosis not present

## 2020-06-09 DIAGNOSIS — Z7984 Long term (current) use of oral hypoglycemic drugs: Secondary | ICD-10-CM | POA: Diagnosis not present

## 2020-06-09 DIAGNOSIS — Z7901 Long term (current) use of anticoagulants: Secondary | ICD-10-CM | POA: Diagnosis not present

## 2020-06-09 DIAGNOSIS — M009 Pyogenic arthritis, unspecified: Secondary | ICD-10-CM | POA: Diagnosis not present

## 2020-06-09 DIAGNOSIS — Z950 Presence of cardiac pacemaker: Secondary | ICD-10-CM | POA: Diagnosis not present

## 2020-06-09 DIAGNOSIS — H409 Unspecified glaucoma: Secondary | ICD-10-CM | POA: Diagnosis not present

## 2020-06-09 DIAGNOSIS — I495 Sick sinus syndrome: Secondary | ICD-10-CM | POA: Diagnosis not present

## 2020-06-12 DIAGNOSIS — Z8739 Personal history of other diseases of the musculoskeletal system and connective tissue: Secondary | ICD-10-CM | POA: Diagnosis not present

## 2020-06-12 DIAGNOSIS — E118 Type 2 diabetes mellitus with unspecified complications: Secondary | ICD-10-CM | POA: Diagnosis not present

## 2020-06-12 DIAGNOSIS — I252 Old myocardial infarction: Secondary | ICD-10-CM | POA: Diagnosis not present

## 2020-06-12 DIAGNOSIS — I10 Essential (primary) hypertension: Secondary | ICD-10-CM | POA: Diagnosis not present

## 2020-06-12 DIAGNOSIS — R634 Abnormal weight loss: Secondary | ICD-10-CM | POA: Diagnosis not present

## 2020-06-12 DIAGNOSIS — E782 Mixed hyperlipidemia: Secondary | ICD-10-CM | POA: Diagnosis not present

## 2020-06-12 DIAGNOSIS — I480 Paroxysmal atrial fibrillation: Secondary | ICD-10-CM | POA: Diagnosis not present

## 2020-06-16 DIAGNOSIS — Z950 Presence of cardiac pacemaker: Secondary | ICD-10-CM | POA: Diagnosis not present

## 2020-06-16 DIAGNOSIS — Z7984 Long term (current) use of oral hypoglycemic drugs: Secondary | ICD-10-CM | POA: Diagnosis not present

## 2020-06-16 DIAGNOSIS — E119 Type 2 diabetes mellitus without complications: Secondary | ICD-10-CM | POA: Diagnosis not present

## 2020-06-16 DIAGNOSIS — M009 Pyogenic arthritis, unspecified: Secondary | ICD-10-CM | POA: Diagnosis not present

## 2020-06-16 DIAGNOSIS — Z7901 Long term (current) use of anticoagulants: Secondary | ICD-10-CM | POA: Diagnosis not present

## 2020-06-16 DIAGNOSIS — I495 Sick sinus syndrome: Secondary | ICD-10-CM | POA: Diagnosis not present

## 2020-06-16 DIAGNOSIS — H409 Unspecified glaucoma: Secondary | ICD-10-CM | POA: Diagnosis not present

## 2020-06-23 ENCOUNTER — Other Ambulatory Visit: Payer: Self-pay

## 2020-06-23 ENCOUNTER — Ambulatory Visit: Payer: PRIVATE HEALTH INSURANCE | Admitting: Cardiology

## 2020-06-23 ENCOUNTER — Encounter: Payer: Self-pay | Admitting: Cardiology

## 2020-06-23 VITALS — BP 126/70 | HR 78 | Resp 16 | Ht 74.0 in | Wt 176.0 lb

## 2020-06-23 DIAGNOSIS — Z950 Presence of cardiac pacemaker: Secondary | ICD-10-CM

## 2020-06-23 DIAGNOSIS — I10 Essential (primary) hypertension: Secondary | ICD-10-CM | POA: Diagnosis not present

## 2020-06-23 DIAGNOSIS — I480 Paroxysmal atrial fibrillation: Secondary | ICD-10-CM

## 2020-06-23 DIAGNOSIS — I25118 Atherosclerotic heart disease of native coronary artery with other forms of angina pectoris: Secondary | ICD-10-CM | POA: Diagnosis not present

## 2020-06-23 NOTE — Progress Notes (Signed)
Primary Physician/Referring:  Deland Pretty, MD  Patient ID: Jesse [name], male    DOB: [birth year], 79 y.o.   MRN: [id number]  Chief Complaint  Patient presents with  . Coronary Artery Disease  . Atrial Fibrillation  . Hypertension  . Follow-up    6 months   HPI:    Jesse [name]  is a 79 y.o. male  with coronary artery disease and CABG in 2007 in [place] with LIMA to LAD, SVG to OM which is occluded by angiography on 02/04/2015, occluded SVG to RCA. He underwent complex successful arthrectomy and PCI to Cx on 11/29/2017. His past medical history also includes sick sinus syndrome status post Medtronic pacemaker implantation 2011, PAF, diabetes mellitus, hypertension and hyperlipidemia.  Last nuclear stress test and echocardiogram on 02/06/2020, had inferior ischemia and normal LV systolic function, EF had improved from 45% to 55% by echo compared to 2019.  No change in the ischemic burden in known occluded right coronary artery.  He is essentially asymptomatic with regard to cardiac issues.  I had last seen him on 03/21/2020 and he had severe swelling in his left knee and also severe edema.  Although admit plans for him to see an orthopedic physician the same day, due to confusion he ended up being in the emergency room 3 days later and admitted with diabetic ketoacidosis and also septic arthritis for which he underwent debridement on 04/03/2020.  He was in rehab until recently, now his recuperated well.  Past Medical History:  Diagnosis Date  . Angina pectoris ([place]) 02/03/2015  . CAD (coronary artery disease), native coronary artery 08/30/2018   2007 [place], [place]: LIMA to LAD, Occluded SVG to RCA and OM by angiogram S/P  PCI/orbital atherectomy S/P 2.5x15 Resoluute to prox Cx on 11/29/2017.  Marland Kitchen Coronary artery disease   . High cholesterol   . Hypertension   . Myocardial infarction [facility]) 2017/2018?  . NSTEMI (non-ST elevated myocardial infarction) ([facility]) 02/03/2015  . Presence of permanent  cardiac pacemaker   . Type II diabetes mellitus ([place])    Past Surgical History:  Procedure Laterality Date  . CARDIAC CATHETERIZATION N/A 02/04/2015   Procedure: Left Heart Cath and Cors/Grafts Angiography;  Surgeon: Adrian Prows, MD;  Location: [place] [facility];  Service: Cardiovascular;  Laterality: N/A;  . CARDIAC CATHETERIZATION N/A 02/04/2015   Procedure: Coronary Stent Intervention;  Surgeon: Adrian Prows, MD;  Location: [place] [facility];  Service: Cardiovascular;  Laterality: N/A;  . CORONARY ANGIOGRAPHY N/A 10/18/2017   Procedure: CORONARY ANGIOGRAPHY;  Surgeon: Adrian Prows, MD;  Location: [place] [facility];  Service: Cardiovascular;  Laterality: N/A;  . CORONARY ANGIOPLASTY    . CORONARY ANGIOPLASTY WITH STENT PLACEMENT     "i've got 5-6 stents total" (08/30/2017)  . CORONARY ARTERY BYPASS GRAFT  2007   CABG X3  . CORONARY ATHERECTOMY N/A 10/18/2017   Procedure: CORONARY ATHERECTOMY;  Surgeon: Adrian Prows, MD;  Location: [place] [facility];  Service: Cardiovascular;  Laterality: N/A;  . CORONARY ATHERECTOMY N/A 11/29/2017   Procedure: CORONARY ATHERECTOMY;  Surgeon: Adrian Prows, MD;  Location: [place] [facility];  Service: Cardiovascular;  Laterality: N/A;  . CORONARY CTO INTERVENTION N/A 08/30/2017   Procedure: CORONARY CTO INTERVENTION;  Surgeon: Adrian Prows, MD;  Location: [place] [facility];  Service: Cardiovascular;  Laterality: N/A;  . CORONARY STENT INTERVENTION N/A 11/29/2017   Procedure: CORONARY STENT INTERVENTION;  Surgeon: Adrian Prows, MD;  Location: [place] [facility];  Service: Cardiovascular;  Laterality: N/A;  . INSERT / REPLACE / REMOVE PACEMAKER  ?2012   "I think it was placed in 2012"  . KNEE ARTHROSCOPY Left 04/03/2020   Procedure: LEFT KNEE ARTHROSCOPY  WASHOUT;  Surgeon: Tania Ade, MD;  Location: [facility];  Service: Orthopedics;  Laterality: Left;  . LEFT HEART CATH AND CORONARY ANGIOGRAPHY N/A 08/30/2017   Procedure: LEFT HEART CATH AND CORONARY ANGIOGRAPHY;  Surgeon:  Adrian Prows, MD;  Location: [place] [facility];  Service: Cardiovascular;  Laterality: N/A;  . LEFT HEART CATH AND CORS/GRAFTS ANGIOGRAPHY N/A 08/05/2017   Procedure: LEFT HEART CATH AND CORS/GRAFTS ANGIOGRAPHY;  Surgeon: Adrian Prows, MD;  Location: [place] [facility];  Service: Cardiovascular;  Laterality: N/A;   Family History  Problem Relation Age of Onset  . Lung cancer Mother   . Stroke Father   . Febrile seizures Brother     Social History   Tobacco Use  . Smoking status: Former Smoker    Years: 3.00    Types: Cigarettes    Quit date: 1960    Years since quitting: 61.9  . Smokeless tobacco: Never Used  Substance Use Topics  . Alcohol use: No   Marital Status: Widowed ROS  Review of Systems  Cardiovascular: Negative for chest pain, dyspnea on exertion and leg swelling.  Musculoskeletal: Positive for arthritis. Negative for joint swelling.  Gastrointestinal: Negative for melena.   Objective  Blood pressure 126/70, pulse 78, resp. rate 16, height '6\' 2"'  (1.88 m), weight 176 lb (79.8 kg), SpO2 96 %.  Vitals with BMI 06/23/2020 04/07/2020 04/07/2020  Height '6\' 2"'  - -  Weight 176 lbs - -  BMI 10.07 - -  Systolic 121 975 883  Diastolic 70 78 64  Pulse 78 80 83     Physical Exam Constitutional:      Appearance: He is well-developed.  Cardiovascular:     Rate and Rhythm: Normal rate and regular rhythm.     Pulses: Intact distal pulses.     Heart sounds: Murmur heard.  Early systolic murmur is present with a grade of 2/6 at the upper right sternal border.  No gallop.      Comments: No leg edema, no JVD. Pulmonary:     Effort: Pulmonary effort is normal.     Breath sounds: Normal breath sounds.  Abdominal:     General: Bowel sounds are normal.     Palpations: Abdomen is soft.  Musculoskeletal:        General: Normal range of motion.    Laboratory examination:    External labs:  A1C 5.700 06/12/2020  TSH 2.250 06/12/2020  Hemoglobin 10.900 g/d  04/06/2020 Platelets 260.000 K/ 04/06/2020  Creatinine, Serum 0.780 mg/ 04/07/2020 Potassium 3.900 mm 04/07/2020 ALT (SGPT) 30.000 U/L 04/06/2020  Labs 11/28/2019:  Hb 15.1/HCT 43.7, platelets 156.  Sodium 139, potassium 4.1, BUN 22, creatinine 1.1, EGFR 68 mL. CMP normal.  Total cholesterol 149, triglycerides 198, HDL 34, LDL 75, non-HDL cholesterol 115.  MicroAlbumin/Creat 11/28/2019  11/23/2018:  PSA normal.  Hb 14.3/HCT 41.8, platelets 223.  Serum glucose 127 mg, BUN 18, creatinine 1.1, EGFR 68 mL, potassium 4.6. CMP otherwise normal.   A1c 6.6%. Total cholesterol 112, triglycerides 126, HDL 29, LDL 58.  Non-HDL cholesterol 83.  TSH 1.890 UIU/ 01/31/2017  Medications and allergies   Allergies  Allergen Reactions  . Brilinta [Ticagrelor] Shortness Of Breath    Difficulty breathing      Current Outpatient Medications  Medication Instructions  . Biotin  5,000 mcg, Oral, Daily  . dorzolamide-timolol (COSOPT) 22.3-6.8 MG/ML ophthalmic solution 1 drop, Both Eyes, 2 times daily  . ezetimibe (ZETIA) 10 mg, Oral, Daily after supper  . HYDROcodone-acetaminophen (NORCO) 5-325 MG tablet 1 tablet, Oral, Every 6 hours PRN  . latanoprost (XALATAN) 0.005 % ophthalmic solution 1 drop, Both Eyes, Daily at bedtime  . losartan (COZAAR) 25 mg, Oral, Daily  . melatonin 5 mg, Oral, Daily at bedtime  . metFORMIN (GLUCOPHAGE-XR) 500 mg, Oral, Daily  . metoprolol tartrate (LOPRESSOR) 25 MG tablet TAKE 1 TABLET BY MOUTH TWICE DAILY  . nitroGLYCERIN (NITROSTAT) 0.4 mg, Sublingual, Every 5 min PRN  . rosuvastatin (CRESTOR) 20 MG tablet 1 tablet, Oral, Daily at bedtime  . traMADol (ULTRAM) 50 mg, Oral, Every 6 hours PRN  . TRUE METRIX BLOOD GLUCOSE TEST test strip TEST 3 TIMES D  . vitamin B-12 (CYANOCOBALAMIN) 1,000 mcg, Oral, Daily  . XARELTO 20 MG TABS tablet TAKE 1 TABLET(20 MG) BY MOUTH DAILY WITH SUPPER   Radiology:   No results found.  Cardiac Studies:   Coronary Angiogram05/01/2018:  ([place]): LIMA to LAD, Occluded SVG to RCA and OM by angiogram S/P PCI/orbital atherectomy S/P 2.5x15 Resoluute to prox Cx on 11/29/2017.  Lexiscan/modified Bruce Tetrofosmin stress test 02/06/2020: Lexiscan/modified Bruce nuclear stress test performed using 1-day protocol. Stress EKG is non-diagnostic, as this is pharmacological stress test. In addition, stress EKG at 110% MPHR showed AV paced rhythm.  SPECT images show medium sized, mild intensity, partially reversible perfusion defect in apical to basal inferior and basal inferoseptal myocardium. While septal perfusion defect may be due to paced rhythm, ischemia cannot be excluded. Recommend clinical correlation.  Stress LVEF 44%. High risk study. No significant change from 02/14/2017.   Echocardiogram 02/06/2020:  Normal LV systolic function with visual EF 55-60%. Left ventricle cavity  is normal in size. Mild left ventricular hypertrophy. Normal global wall  motion. Indeterminate diastolic filling pattern, indeterminate LAP. Calculated EF 55%.  Left atrial cavity is normal in size. A lipomatous septum is present.  Mild to moderate aortic regurgitation.  Moderate tricuspid regurgitation.  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   Scheduled Remote Pacemaker transmission 09/04/2019:  There were 34 automatic mode switch episodes detected since 08/31/19. The longest lasted 00:02:2:08 in duration. Review of available EGM demonstrates paroxysmal atrial fibrillation. There was a <0.1 % cumulative atrial arrhythmia burden. There were 0 high ventricular rate episodes detected since 08/31/19. Battery longevity is 27 months. RA pacing is 48.1 %, RV pacing is 0.1 %.  Scheduled  In office pacemaker check 12/19/19  Single (NSR @ 69/min. Pacemaker dependant:  No. Underlying NSR. AP 49%, VP 1%. AMS Episodes  26.  AT/AF burden <0.1% . Longest 2 hours. Latest 09/06/2019. HVR 0.  Longevity 2.5 Years. Magnet rate: >85%. Lead measurements: Stable. Histogram: Low (L)/normal (N)/high (H)  Normal. Patient activity Good.   Observations: Normal pacemaker function. Changes: None.  EKG   09/20/2019: Atrially paced rhythm at rate of 78 bpm, left axis deviation, left anterior fascicular block.  Right bundle branch block.  No evidence of ischemia.   No significant change from  EKG 03/19/2019   Assessment     ICD-10-CM   1. Coronary artery disease of native artery of native heart with stable angina pectoris ([place])  I25.118 EKG 12-Lead  2. PAF (paroxysmal atrial fibrillation) (  [place]). CHA2DS2-VASc Score is 5 (A, HTN, DM Vasc Dz). Yearly risk of stroke 6.7%  I48.0   3. Essential hypertension  I10   4. Pacemaker Medtronic dual chamber adapta ADDR01 on 11/20/2009 in [place].  Z95.0      No orders of the defined types were placed in this encounter.   Medications Discontinued During This Encounter  Medication Reason  . insulin glargine (LANTUS) 100 UNIT/ML injection Completed Course  . levofloxacin (LEVAQUIN) 750 MG tablet Completed Course  . polyethylene glycol (MIRALAX / GLYCOLAX) 17 g packet No longer needed (for PRN medications)  . senna (SENOKOT) 8.6 MG TABS tablet No longer needed (for PRN medications)  . simvastatin (ZOCOR) 40 MG tablet Change in therapy     Recommendations:   Jadd Gasior  is a 79 y.o. male  with coronary artery disease and CABG in 2007 in [place] with LIMA to LAD, SVG to OM which is occluded by angiography on 02/04/2015, occluded SVG to RCA. He underwent complex successful arthrectomy and PCI to Cx on 11/29/2017. His past medical history also includes sick sinus syndrome status post Medtronic pacemaker implantation 2011, PAF, diabetes mellitus, hypertension and hyperlipidemia.  Last nuclear stress test and echocardiogram on 02/06/2020, had inferior ischemia and normal LV systolic function,  EF had improved from 45% to 55% by echo compared to 2019.  No change in the ischemic burden in known occluded right coronary artery.  He is essentially asymptomatic with regard to cardiac issues.  Fortunately patient has not had any recurrence of angina pectoris.  His blood pressure is well controlled, he is back on appropriate medical therapy, due to recent septic arthritis involving his left knee, he has lost significant amount of weight but overall feels well and no clinical evidence heart failure and is maintaining sinus rhythm by auscultation.  He has not had pacemaker transmission for most 8 months, he will restart transmitting remotely.  I did not make any changes to his medications.  I updated his labs.    Adrian Prows, MD, [facility] 06/23/2020, 1:46 PM Office: [phone]

## 2020-07-01 DIAGNOSIS — H409 Unspecified glaucoma: Secondary | ICD-10-CM | POA: Diagnosis not present

## 2020-07-01 DIAGNOSIS — I495 Sick sinus syndrome: Secondary | ICD-10-CM | POA: Diagnosis not present

## 2020-07-01 DIAGNOSIS — E119 Type 2 diabetes mellitus without complications: Secondary | ICD-10-CM | POA: Diagnosis not present

## 2020-07-01 DIAGNOSIS — M009 Pyogenic arthritis, unspecified: Secondary | ICD-10-CM | POA: Diagnosis not present

## 2020-07-07 DIAGNOSIS — M009 Pyogenic arthritis, unspecified: Secondary | ICD-10-CM | POA: Diagnosis not present

## 2020-07-07 DIAGNOSIS — Z7984 Long term (current) use of oral hypoglycemic drugs: Secondary | ICD-10-CM | POA: Diagnosis not present

## 2020-07-07 DIAGNOSIS — E119 Type 2 diabetes mellitus without complications: Secondary | ICD-10-CM | POA: Diagnosis not present

## 2020-07-07 DIAGNOSIS — Z950 Presence of cardiac pacemaker: Secondary | ICD-10-CM | POA: Diagnosis not present

## 2020-07-07 DIAGNOSIS — I495 Sick sinus syndrome: Secondary | ICD-10-CM | POA: Diagnosis not present

## 2020-07-07 DIAGNOSIS — H409 Unspecified glaucoma: Secondary | ICD-10-CM | POA: Diagnosis not present

## 2020-07-07 DIAGNOSIS — Z7901 Long term (current) use of anticoagulants: Secondary | ICD-10-CM | POA: Diagnosis not present

## 2020-07-13 DIAGNOSIS — Z950 Presence of cardiac pacemaker: Secondary | ICD-10-CM | POA: Diagnosis not present

## 2020-07-13 DIAGNOSIS — Z45018 Encounter for adjustment and management of other part of cardiac pacemaker: Secondary | ICD-10-CM | POA: Diagnosis not present

## 2020-07-13 DIAGNOSIS — I495 Sick sinus syndrome: Secondary | ICD-10-CM | POA: Diagnosis not present

## 2020-07-14 ENCOUNTER — Other Ambulatory Visit: Payer: Self-pay | Admitting: Cardiology

## 2020-07-14 DIAGNOSIS — E7800 Pure hypercholesterolemia, unspecified: Secondary | ICD-10-CM

## 2020-07-15 ENCOUNTER — Telehealth: Payer: Self-pay

## 2020-07-15 DIAGNOSIS — L304 Erythema intertrigo: Secondary | ICD-10-CM | POA: Diagnosis not present

## 2020-07-15 DIAGNOSIS — M25561 Pain in right knee: Secondary | ICD-10-CM | POA: Diagnosis not present

## 2020-07-15 DIAGNOSIS — Z23 Encounter for immunization: Secondary | ICD-10-CM | POA: Diagnosis not present

## 2020-07-15 NOTE — Telephone Encounter (Signed)
Application for Patient Assistance for Xarelto has been faxed.

## 2020-08-20 ENCOUNTER — Other Ambulatory Visit: Payer: Self-pay | Admitting: Cardiology

## 2020-08-20 DIAGNOSIS — E7800 Pure hypercholesterolemia, unspecified: Secondary | ICD-10-CM

## 2020-10-23 ENCOUNTER — Telehealth: Payer: Self-pay | Admitting: Cardiology

## 2020-10-23 DIAGNOSIS — Z950 Presence of cardiac pacemaker: Secondary | ICD-10-CM

## 2020-10-23 NOTE — Telephone Encounter (Signed)
Number on file disconnected. I will send letter for patient to contact us.

## 2020-11-13 ENCOUNTER — Other Ambulatory Visit: Payer: Self-pay | Admitting: Cardiology

## 2020-11-19 ENCOUNTER — Other Ambulatory Visit: Payer: Self-pay | Admitting: Cardiology

## 2020-12-03 DIAGNOSIS — E782 Mixed hyperlipidemia: Secondary | ICD-10-CM | POA: Diagnosis not present

## 2020-12-03 DIAGNOSIS — E118 Type 2 diabetes mellitus with unspecified complications: Secondary | ICD-10-CM | POA: Diagnosis not present

## 2020-12-03 DIAGNOSIS — I10 Essential (primary) hypertension: Secondary | ICD-10-CM | POA: Diagnosis not present

## 2020-12-03 NOTE — Telephone Encounter (Signed)
Patient phone back on, patient also said his mouse was broken and he recently received, and will transmit today.

## 2020-12-10 DIAGNOSIS — I10 Essential (primary) hypertension: Secondary | ICD-10-CM | POA: Diagnosis not present

## 2020-12-10 DIAGNOSIS — I480 Paroxysmal atrial fibrillation: Secondary | ICD-10-CM | POA: Diagnosis not present

## 2020-12-10 DIAGNOSIS — E782 Mixed hyperlipidemia: Secondary | ICD-10-CM | POA: Diagnosis not present

## 2020-12-10 DIAGNOSIS — Z0001 Encounter for general adult medical examination with abnormal findings: Secondary | ICD-10-CM | POA: Diagnosis not present

## 2020-12-10 DIAGNOSIS — E118 Type 2 diabetes mellitus with unspecified complications: Secondary | ICD-10-CM | POA: Diagnosis not present

## 2020-12-10 DIAGNOSIS — I2510 Atherosclerotic heart disease of native coronary artery without angina pectoris: Secondary | ICD-10-CM | POA: Diagnosis not present

## 2020-12-10 DIAGNOSIS — L570 Actinic keratosis: Secondary | ICD-10-CM | POA: Diagnosis not present

## 2020-12-15 ENCOUNTER — Ambulatory Visit: Payer: Medicare HMO | Admitting: Cardiology

## 2020-12-15 DIAGNOSIS — H25812 Combined forms of age-related cataract, left eye: Secondary | ICD-10-CM | POA: Diagnosis not present

## 2020-12-15 DIAGNOSIS — H401132 Primary open-angle glaucoma, bilateral, moderate stage: Secondary | ICD-10-CM | POA: Diagnosis not present

## 2020-12-15 DIAGNOSIS — E119 Type 2 diabetes mellitus without complications: Secondary | ICD-10-CM | POA: Diagnosis not present

## 2020-12-15 DIAGNOSIS — H04123 Dry eye syndrome of bilateral lacrimal glands: Secondary | ICD-10-CM | POA: Diagnosis not present

## 2020-12-15 DIAGNOSIS — H2511 Age-related nuclear cataract, right eye: Secondary | ICD-10-CM | POA: Diagnosis not present

## 2020-12-15 NOTE — Telephone Encounter (Signed)
Remote pacemaker transmission 12/03/2020: Longevity 13 months.  AP 41%, VP 77%.  There were 3 mode switches.  There was no high atrial rate episodes.  No EGM for evaluation.

## 2020-12-18 ENCOUNTER — Ambulatory Visit: Payer: Medicare Other | Admitting: Cardiology

## 2020-12-18 ENCOUNTER — Encounter: Payer: Self-pay | Admitting: Cardiology

## 2020-12-18 ENCOUNTER — Other Ambulatory Visit: Payer: Self-pay

## 2020-12-18 VITALS — BP 125/73 | HR 86 | Temp 98.0°F | Resp 17 | Ht 74.0 in | Wt 176.4 lb

## 2020-12-18 DIAGNOSIS — E782 Mixed hyperlipidemia: Secondary | ICD-10-CM

## 2020-12-18 DIAGNOSIS — I25118 Atherosclerotic heart disease of native coronary artery with other forms of angina pectoris: Secondary | ICD-10-CM

## 2020-12-18 DIAGNOSIS — Z951 Presence of aortocoronary bypass graft: Secondary | ICD-10-CM

## 2020-12-18 DIAGNOSIS — I10 Essential (primary) hypertension: Secondary | ICD-10-CM | POA: Diagnosis not present

## 2020-12-18 DIAGNOSIS — I480 Paroxysmal atrial fibrillation: Secondary | ICD-10-CM

## 2020-12-18 DIAGNOSIS — Z950 Presence of cardiac pacemaker: Secondary | ICD-10-CM | POA: Diagnosis not present

## 2020-12-18 NOTE — Progress Notes (Signed)
Primary Physician/Referring:  Deland Pretty, MD  Patient ID: Jesse Jesse [name], Jesse [name]    DOB: Dec 22, 1940, 80 y.o.   MRN: [id number]  Chief Complaint  Patient presents with  . Atrial Fibrillation  . Hypertension    6 MONTH   HPI:    Jesse Jesse [name]  is a 80 y.o. Jesse [name]  with coronary artery disease and CABG in 2007 in [place] with LIMA to LAD, SVG to OM which is occluded by angiography on 02/04/2015, occluded SVG to RCA. Jesse underwent complex successful arthrectomy and PCI to Cx on 11/29/2017. His past medical history also includes sick sinus syndrome status post Medtronic pacemaker implantation 2011, PAF, diabetes mellitus, hypertension and hyperlipidemia.  Last nuclear stress test and echocardiogram on 02/06/2020, had inferior ischemia and normal LV systolic function, EF had improved from 45% to 55% by echo compared to 2019.  No change in the ischemic burden in known occluded right coronary artery. Jesse is essentially asymptomatic with regard to cardiac issues.  Past Medical History:  Diagnosis Date  . Angina pectoris ([place]) 02/03/2015  . CAD (coronary artery disease), native coronary artery 08/30/2018   2007 [place], [place]: LIMA to LAD, Occluded SVG to RCA and OM by angiogram S/P  PCI/orbital atherectomy S/P 2.5x15 Resoluute to prox Cx on 11/29/2017.  Marland Kitchen Coronary artery disease   . High cholesterol   . Hypertension   . Myocardial infarction [facility]) 2017/2018?  . NSTEMI (non-ST elevated myocardial infarction) ([place]) 02/03/2015  . Presence of permanent cardiac pacemaker   . Type II diabetes mellitus ([place])    Past Surgical History:  Procedure Laterality Date  . CARDIAC CATHETERIZATION N/A 02/04/2015   Procedure: Left Heart Cath and Cors/Grafts Angiography;  Surgeon: Adrian Prows, MD;  Location: [place] [facility];  Service: Cardiovascular;  Laterality: N/A;  . CARDIAC CATHETERIZATION N/A 02/04/2015   Procedure: Coronary Stent Intervention;  Surgeon: Adrian Prows, MD;  Location: [place] [facility];  Service:  Cardiovascular;  Laterality: N/A;  . CORONARY ANGIOGRAPHY N/A 10/18/2017   Procedure: CORONARY ANGIOGRAPHY;  Surgeon: Adrian Prows, MD;  Location: [place] [facility];  Service: Cardiovascular;  Laterality: N/A;  . CORONARY ANGIOPLASTY    . CORONARY ANGIOPLASTY WITH STENT PLACEMENT     "i've got 5-6 stents total" (08/30/2017)  . CORONARY ARTERY BYPASS GRAFT  2007   CABG X3  . CORONARY ATHERECTOMY N/A 10/18/2017   Procedure: CORONARY ATHERECTOMY;  Surgeon: Adrian Prows, MD;  Location: [place] [facility];  Service: Cardiovascular;  Laterality: N/A;  . CORONARY ATHERECTOMY N/A 11/29/2017   Procedure: CORONARY ATHERECTOMY;  Surgeon: Adrian Prows, MD;  Location: [place] [facility];  Service: Cardiovascular;  Laterality: N/A;  . CORONARY CTO INTERVENTION N/A 08/30/2017   Procedure: CORONARY CTO INTERVENTION;  Surgeon: Adrian Prows, MD;  Location: [place] [facility];  Service: Cardiovascular;  Laterality: N/A;  . CORONARY STENT INTERVENTION N/A 11/29/2017   Procedure: CORONARY STENT INTERVENTION;  Surgeon: Adrian Prows, MD;  Location: [place] [facility];  Service: Cardiovascular;  Laterality: N/A;  . INSERT / REPLACE / REMOVE PACEMAKER  ?2012   "I think it was placed in 2012"  . KNEE ARTHROSCOPY Left 04/03/2020   Procedure: LEFT KNEE ARTHROSCOPY  WASHOUT;  Surgeon: Tania Ade, MD;  Location: [facility];  Service: Orthopedics;  Laterality: Left;  . LEFT HEART CATH AND CORONARY ANGIOGRAPHY N/A 08/30/2017   Procedure: LEFT HEART CATH AND CORONARY ANGIOGRAPHY;  Surgeon: Adrian Prows, MD;  Location: [place] [facility];  Service: Cardiovascular;  Laterality:  N/A;  . LEFT HEART CATH AND CORS/GRAFTS ANGIOGRAPHY N/A 08/05/2017   Procedure: LEFT HEART CATH AND CORS/GRAFTS ANGIOGRAPHY;  Surgeon: Adrian Prows, MD;  Location: [place] [facility];  Service: Cardiovascular;  Laterality: N/A;   Family History  Problem Relation Age of Onset  . Lung cancer Mother   . Stroke Father   . Febrile seizures Brother     Social History    Tobacco Use  . Smoking status: Former Smoker    Years: 3.00    Types: Cigarettes    Quit date: 1960    Years since quitting: 62.4  . Smokeless tobacco: Never Used  Substance Use Topics  . Alcohol use: No   Marital Status: Widowed ROS  Review of Systems  Cardiovascular: Negative for chest pain, dyspnea on exertion and leg swelling.  Musculoskeletal: Negative for arthritis and joint swelling.  Gastrointestinal: Negative for melena.   Objective  Blood pressure 125/73, pulse 86, temperature 98 F (36.7 C), temperature source Temporal, resp. rate 17, height '6\' 2"'  (1.88 m), weight 176 lb 6.4 oz (80 kg), SpO2 98 %.  Vitals with BMI 12/18/2020 06/23/2020 04/07/2020  Height '6\' 2"'  '6\' 2"'  -  Weight 176 lbs 6 oz 176 lbs -  BMI 36.14 43.15 -  Systolic 400 867 619  Diastolic 73 70 78  Pulse 86 78 80     Physical Exam Constitutional:      Appearance: Jesse is Jesse [name]-developed.  Neck:     Vascular: No carotid bruit or JVD.  Cardiovascular:     Rate and Rhythm: Normal rate and regular rhythm.     Pulses: Intact distal pulses.          Popliteal pulses are 2+ on the right side and 2+ on the left side.       Dorsalis pedis pulses are 2+ on the right side and 1+ on the left side.       Posterior tibial pulses are 0 on the right side and 0 on the left side.     Heart sounds: Heart sounds are distant. No murmur heard.  Early systolic murmur is present at the upper right sternal border. No gallop.      Comments: No leg edema, no JVD. Pulmonary:     Effort: Pulmonary effort is normal.     Breath sounds: Normal breath sounds.  Abdominal:     General: Bowel sounds are normal.     Palpations: Abdomen is soft.  Musculoskeletal:        General: No swelling. Normal range of motion.    Laboratory examination:    External labs:  Lab 12/03/2020:  A1c 6.7%.  Total cholesterol 95, triglycerides 108, HDL 40, LDL 33.  Hb 12.7/HCT 37.4, platelets 142.  Normal indicis.  Serum glucose 144, BUN  21, creatinine 1.02, EGFR 69 mL, potassium 4.4.  CMP normal.    Medications and allergies   Allergies  Allergen Reactions  . Brilinta [Ticagrelor] Shortness Of Breath    Difficulty breathing     Current Outpatient Medications on File Prior to Visit  Medication Sig Dispense Refill  . Biotin 5000 MCG TABS Take 5,000 mcg by mouth daily.    . dorzolamide-timolol (COSOPT) 22.3-6.8 MG/ML ophthalmic solution Place 1 drop into both eyes 2 (two) times daily.  10  . ezetimibe (ZETIA) 10 MG tablet TAKE 1 TABLET(10 MG) BY MOUTH DAILY AFTER SUPPER 30 tablet 2  . HYDROcodone-acetaminophen (NORCO) 5-325 MG tablet Take 1 tablet by mouth every 6 (six) hours  as needed for moderate pain. 20 tablet 0  . latanoprost (XALATAN) 0.005 % ophthalmic solution Place 1 drop into both eyes at bedtime.  12  . losartan (COZAAR) 25 MG tablet TAKE 1 TABLET BY MOUTH DAILY 90 tablet 0  . melatonin 5 MG TABS Take 5 mg by mouth at bedtime.     . metFORMIN (GLUCOPHAGE-XR) 500 MG 24 hr tablet Take 1 tablet (500 mg total) by mouth daily. (Patient taking differently: Take 500 mg by mouth in the morning and at bedtime. 2 IN THE MORNING 1 AT NIGHT)  1  . metoprolol tartrate (LOPRESSOR) 25 MG tablet TAKE 1 TABLET BY MOUTH TWICE DAILY 180 tablet 3  . nitroGLYCERIN (NITROSTAT) 0.4 MG SL tablet Place 1 tablet (0.4 mg total) under the tongue every 5 (five) minutes as needed for chest pain. 25 tablet 3  . rosuvastatin (CRESTOR) 20 MG tablet Take 1 tablet by mouth at bedtime.    . traMADol (ULTRAM) 50 MG tablet Take 1 tablet (50 mg total) by mouth every 6 (six) hours as needed for moderate pain. 20 tablet 0  . TRUE METRIX BLOOD GLUCOSE TEST test strip TEST 3 TIMES D    . vitamin B-12 (CYANOCOBALAMIN) 1000 MCG tablet Take 1,000 mcg by mouth daily.    Alveda Reasons 20 MG TABS tablet TAKE 1 TABLET(20 MG) BY MOUTH DAILY WITH SUPPER (Patient taking differently: Take 20 mg by mouth daily with supper.) 30 tablet 3   No current facility-administered  medications on file prior to visit.    Current Meds  Medication Sig  . Biotin 5000 MCG TABS Take 5,000 mcg by mouth daily.  . dorzolamide-timolol (COSOPT) 22.3-6.8 MG/ML ophthalmic solution Place 1 drop into both eyes 2 (two) times daily.  Marland Kitchen ezetimibe (ZETIA) 10 MG tablet TAKE 1 TABLET(10 MG) BY MOUTH DAILY AFTER SUPPER  . HYDROcodone-acetaminophen (NORCO) 5-325 MG tablet Take 1 tablet by mouth every 6 (six) hours as needed for moderate pain.  Marland Kitchen latanoprost (XALATAN) 0.005 % ophthalmic solution Place 1 drop into both eyes at bedtime.  Marland Kitchen losartan (COZAAR) 25 MG tablet TAKE 1 TABLET BY MOUTH DAILY  . melatonin 5 MG TABS Take 5 mg by mouth at bedtime.   . metFORMIN (GLUCOPHAGE-XR) 500 MG 24 hr tablet Take 1 tablet (500 mg total) by mouth daily. (Patient taking differently: Take 500 mg by mouth in the morning and at bedtime. 2 IN THE MORNING 1 AT NIGHT)  . metoprolol tartrate (LOPRESSOR) 25 MG tablet TAKE 1 TABLET BY MOUTH TWICE DAILY  . nitroGLYCERIN (NITROSTAT) 0.4 MG SL tablet Place 1 tablet (0.4 mg total) under the tongue every 5 (five) minutes as needed for chest pain.  . rosuvastatin (CRESTOR) 20 MG tablet Take 1 tablet by mouth at bedtime.  . traMADol (ULTRAM) 50 MG tablet Take 1 tablet (50 mg total) by mouth every 6 (six) hours as needed for moderate pain.  . TRUE METRIX BLOOD GLUCOSE TEST test strip TEST 3 TIMES D  . vitamin B-12 (CYANOCOBALAMIN) 1000 MCG tablet Take 1,000 mcg by mouth daily.  Alveda Reasons 20 MG TABS tablet TAKE 1 TABLET(20 MG) BY MOUTH DAILY WITH SUPPER (Patient taking differently: Take 20 mg by mouth daily with supper.)    Radiology:   No results found.  Cardiac Studies:   Coronary Angiogram05/01/2018: ([place]): LIMA to LAD, Occluded SVG to RCA and OM by angiogram S/P PCI/orbital atherectomy S/P 2.5x15 Resoluute to prox Cx on 11/29/2017.  Lexiscan/modified Bruce Tetrofosmin stress test 02/06/2020:  Lexiscan/modified Bruce nuclear stress test  performed using 1-day protocol. Stress EKG is non-diagnostic, as this is pharmacological stress test. In addition, stress EKG at 110% MPHR showed AV paced rhythm.  SPECT images show medium sized, mild intensity, partially reversible perfusion defect in apical to basal inferior and basal inferoseptal myocardium. While septal perfusion defect may be due to paced rhythm, ischemia cannot be excluded. Recommend clinical correlation.  Stress LVEF 44%. High risk study. No significant change from 02/14/2017.   Echocardiogram 02/06/2020:  Normal LV systolic function with visual EF 55-60%. Left ventricle cavity  is normal in size. Mild left ventricular hypertrophy. Normal global wall  motion. Indeterminate diastolic filling pattern, indeterminate LAP. Calculated EF 55%.  Left atrial cavity is normal in size. A lipomatous septum is present.  Mild to moderate aortic regurgitation.  Moderate tricuspid regurgitation.  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   Pacemaker Medtronic dual chamber adapta [id number] on 11/20/2009   Remote pacemaker transmission 12/03/2020: Longevity 13 months.  AP 41%, VP 77%.  There were 3 mode switches.  There was no high atrial rate episodes. No EGM for evaluation.  EKG   EKG 12/18/2020: Atrial fibrillation with ventricularly paced rhythm.  No further analysis.    09/20/2019: Atrially paced rhythm at rate of 78 bpm, left axis deviation, left anterior fascicular block.  Right bundle branch block.  No evidence of ischemia.   No significant change from  EKG 03/19/2019   Assessment     ICD-10-CM   1. Coronary artery disease of native artery of native heart with stable angina pectoris ([place])  I25.118   2. S/P CABG x 3  Z95.1   3. PAF (paroxysmal atrial fibrillation) ([place]). CHA2DS2-VASc Score is 5 (A, HTN, DM Vasc Dz). Yearly risk of stroke 6.7%   I48.0   4. Pacemaker Medtronic dual chamber adapta ADDR01 on 11/20/2009 in [place].  Z95.0   5. Essential hypertension  I10 EKG 12-Lead  6. Mixed hyperlipidemia  E78.2      No orders of the defined types were placed in this encounter.   There are no discontinued medications.   Recommendations:   Caven Perine  is a 80 y.o. Jesse [name]  with coronary artery disease and CABG in 2007 in [place] with LIMA to LAD, SVG to OM which is occluded by angiography on 02/04/2015, occluded SVG to RCA. Jesse underwent complex successful arthrectomy and PCI to Cx on 11/29/2017. His past medical history also includes sick sinus syndrome status post Medtronic pacemaker implantation 2011, PAF, diabetes mellitus, hypertension and hyperlipidemia.  Last nuclear stress test and echocardiogram on 02/06/2020, had inferior ischemia and normal LV systolic function, EF had improved from 45% to 55% by echo compared to 2019.  No change in the ischemic burden in known occluded right coronary artery.  Jesse is essentially asymptomatic with regard to cardiac issues.   Jesse Jesse [name], Jesse has not had any recurrence of angina or dyspnea, remained stable, Jesse has not had any bleeding diathesis.  I reviewed his external labs, lipids are under excellent control and his diabetes is also under excellent control.  His EKG today reveals probably underlying atrial fibrillation as I am unable to see P waves.  Jesse remains asymptomatic and hence we will continue observation for now and in view of high cardioembolic risk, will continue Xarelto.  Is getting dental  extraction done, advised him to hold Xarelto only for a day, letter to his dentist sent today.  I will see him back in 6 months.  Jesse has approximately 1 year of battery life left in his pacemaker.    Adrian Prows, MD, [facility] 12/18/2020, 10:00 AM Office: [phone]

## 2020-12-24 ENCOUNTER — Other Ambulatory Visit: Payer: Self-pay | Admitting: Cardiology

## 2020-12-24 DIAGNOSIS — E7800 Pure hypercholesterolemia, unspecified: Secondary | ICD-10-CM

## 2021-01-11 DIAGNOSIS — I495 Sick sinus syndrome: Secondary | ICD-10-CM | POA: Diagnosis not present

## 2021-01-11 DIAGNOSIS — Z950 Presence of cardiac pacemaker: Secondary | ICD-10-CM | POA: Diagnosis not present

## 2021-01-11 DIAGNOSIS — Z45018 Encounter for adjustment and management of other part of cardiac pacemaker: Secondary | ICD-10-CM | POA: Diagnosis not present

## 2021-01-18 ENCOUNTER — Other Ambulatory Visit: Payer: Self-pay | Admitting: Cardiology

## 2021-01-18 DIAGNOSIS — E7800 Pure hypercholesterolemia, unspecified: Secondary | ICD-10-CM

## 2021-02-12 ENCOUNTER — Other Ambulatory Visit: Payer: Self-pay | Admitting: Cardiology

## 2021-03-12 DIAGNOSIS — I10 Essential (primary) hypertension: Secondary | ICD-10-CM | POA: Diagnosis not present

## 2021-03-12 DIAGNOSIS — E118 Type 2 diabetes mellitus with unspecified complications: Secondary | ICD-10-CM | POA: Diagnosis not present

## 2021-03-12 DIAGNOSIS — Z951 Presence of aortocoronary bypass graft: Secondary | ICD-10-CM | POA: Diagnosis not present

## 2021-03-12 DIAGNOSIS — E782 Mixed hyperlipidemia: Secondary | ICD-10-CM | POA: Diagnosis not present

## 2021-03-12 DIAGNOSIS — I480 Paroxysmal atrial fibrillation: Secondary | ICD-10-CM | POA: Diagnosis not present

## 2021-04-14 DIAGNOSIS — Z23 Encounter for immunization: Secondary | ICD-10-CM | POA: Diagnosis not present

## 2021-05-13 ENCOUNTER — Other Ambulatory Visit: Payer: Self-pay | Admitting: Cardiology

## 2021-06-01 ENCOUNTER — Encounter: Payer: Self-pay | Admitting: Cardiology

## 2021-06-04 ENCOUNTER — Ambulatory Visit: Payer: Medicare Other | Admitting: Cardiology

## 2021-06-04 ENCOUNTER — Other Ambulatory Visit: Payer: Self-pay

## 2021-06-04 DIAGNOSIS — Z950 Presence of cardiac pacemaker: Secondary | ICD-10-CM | POA: Diagnosis not present

## 2021-06-04 DIAGNOSIS — I442 Atrioventricular block, complete: Secondary | ICD-10-CM

## 2021-06-04 DIAGNOSIS — I495 Sick sinus syndrome: Secondary | ICD-10-CM | POA: Diagnosis not present

## 2021-06-04 DIAGNOSIS — Z45010 Encounter for checking and testing of cardiac pacemaker pulse generator [battery]: Secondary | ICD-10-CM

## 2021-06-04 DIAGNOSIS — Z45018 Encounter for adjustment and management of other part of cardiac pacemaker: Secondary | ICD-10-CM | POA: Diagnosis not present

## 2021-06-04 NOTE — Progress Notes (Signed)
No chief complaint on file.    ICD-10-CM   1. Encounter for care of pacemaker  Z45.018     2. Sick sinus syndrome (HCC)  I49.5 Ambulatory referral to Cardiac Electrophysiology    3. Pacemaker Medtronic dual chamber adapta [id number] on 11/20/2009 in [place].  Z95.0     4. Intermittent complete heart block (HCC)  I44.2 Ambulatory referral to Cardiac Electrophysiology    5. Pacemaker at end of battery life  Z45.010 Ambulatory referral to Cardiac Electrophysiology     Remote dual-chamber pacemaker transmission 01/11/2021: Longevity 12 months.  Lead impedance and thresholds within normal limits.  AP 41%, VP 80%.  There were no mode switches or high ventricular rate episodes.  Normal pacemaker function.  Scheduled  In office pacemaker check 06/04/21  Single (S)/Dual (D)/BV: D. Presenting ASVP. Pacemaker dependant:  Yes. Underlying SR with CHB. AP 43%, VP 79%.  . AMS Episodes 0.  HVR 0 Longevity 30 days. Magnet rate: >85%. Lead measurements: Stable. Histogram: Low (L)/normal (N)/high (H)  Normal. Patient activity NA.   Observations: Normal pacemaker function, EOL approaching. Changes: None. Will refer for generator change.    Adrian Prows, MD, [facility] 06/04/2021, 2:39 PM Office: [phone] Fax: [phone] Pager: [phone]

## 2021-06-08 ENCOUNTER — Other Ambulatory Visit: Payer: Self-pay

## 2021-06-08 ENCOUNTER — Ambulatory Visit: Payer: Medicare Other | Admitting: Student

## 2021-06-08 ENCOUNTER — Encounter: Payer: Self-pay | Admitting: Student

## 2021-06-08 VITALS — BP 133/57 | HR 65 | Temp 98.3°F | Resp 16 | Ht 74.0 in | Wt 174.0 lb

## 2021-06-08 DIAGNOSIS — R0602 Shortness of breath: Secondary | ICD-10-CM | POA: Diagnosis not present

## 2021-06-08 DIAGNOSIS — I209 Angina pectoris, unspecified: Secondary | ICD-10-CM

## 2021-06-08 DIAGNOSIS — I25118 Atherosclerotic heart disease of native coronary artery with other forms of angina pectoris: Secondary | ICD-10-CM

## 2021-06-08 MED ORDER — ISOSORBIDE MONONITRATE ER 30 MG PO TB24: 30.0000 mg | ORAL_TABLET | Freq: Every day | ORAL | 3 refills | Status: DC

## 2021-06-08 NOTE — H&P (View-Only) (Signed)
Primary Physician/Referring:  Deland Pretty, MD  Patient ID: Jesse [name], male    DOB: [birth year], 80 y.o.   MRN: [id number]  Chief Complaint  Patient presents with   Shortness of Breath   Dizziness   HPI:    Jesse [name]  is a 80 y.o. male  with coronary artery disease and CABG in 2007 in [place] with LIMA to LAD, SVG to OM which is occluded by angiography on 02/04/2015, occluded SVG to RCA. He underwent complex successful arthrectomy and PCI to Cx on 11/29/2017.  His past medical history also includes sick sinus syndrome status post Medtronic pacemaker implantation 2011, PAF, diabetes mellitus, hypertension and hyperlipidemia.  Last nuclear stress test and echocardiogram on 02/06/2020, had inferior ischemia and normal LV systolic function, EF had improved from 45% to 55% by echo compared to 2019.    Patient presents for urgent visit at his request with complaints of shortness of breath.  Last office visit patient was stable from a cardiovascular standpoint and no changes were made.  Patient reports he has been experiencing dyspnea on exertion even with minimal daily activity for the last 4 days.  He was seen in our office last week for pacemaker check, which revealed normal pacemaker function and no significant abnormalities.  Over the last 4 days patient also reports 2 episodes of "chest tightness" while walking, relieved by rest.    Past Medical History:  Diagnosis Date   Angina pectoris ([place]) 02/03/2015   CAD (coronary artery disease), native coronary artery 08/30/2018   2007 [place], [place]: LIMA to LAD, Occluded SVG to RCA and OM by angiogram S/P  PCI/orbital atherectomy S/P 2.5x15 Resoluute to prox Cx on 11/29/2017.   Coronary artery disease    High cholesterol    Hypertension    Myocardial infarction [facility]) 2017/2018?   NSTEMI (non-ST elevated myocardial infarction) ([place]) 02/03/2015   Presence of permanent cardiac pacemaker    Type II diabetes mellitus ([place])    Past Surgical  History:  Procedure Laterality Date   CARDIAC CATHETERIZATION N/A 02/04/2015   Procedure: Left Heart Cath and Cors/Grafts Angiography;  Surgeon: Adrian Prows, MD;  Location: [place];  Service: Cardiovascular;  Laterality: N/A;   CARDIAC CATHETERIZATION N/A 02/04/2015   Procedure: Coronary Stent Intervention;  Surgeon: Adrian Prows, MD;  Location: [place] [facility];  Service: Cardiovascular;  Laterality: N/A;   CORONARY ANGIOGRAPHY N/A 10/18/2017   Procedure: CORONARY ANGIOGRAPHY;  Surgeon: Adrian Prows, MD;  Location: [place] [facility];  Service: Cardiovascular;  Laterality: N/A;   CORONARY ANGIOPLASTY     CORONARY ANGIOPLASTY WITH STENT PLACEMENT     "i've got 5-6 stents total" (08/30/2017)   CORONARY ARTERY BYPASS GRAFT  2007   CABG X3   CORONARY ATHERECTOMY N/A 10/18/2017   Procedure: CORONARY ATHERECTOMY;  Surgeon: Adrian Prows, MD;  Location: [place] [facility];  Service: Cardiovascular;  Laterality: N/A;   CORONARY ATHERECTOMY N/A 11/29/2017   Procedure: CORONARY ATHERECTOMY;  Surgeon: Adrian Prows, MD;  Location: [place] [facility];  Service: Cardiovascular;  Laterality: N/A;   CORONARY CTO INTERVENTION N/A 08/30/2017   Procedure: CORONARY CTO INTERVENTION;  Surgeon: Adrian Prows, MD;  Location: [place] [facility];  Service: Cardiovascular;  Laterality: N/A;   CORONARY STENT INTERVENTION N/A 11/29/2017   Procedure: CORONARY STENT INTERVENTION;  Surgeon: Adrian Prows, MD;  Location: [place];  Service: Cardiovascular;  Laterality: N/A;   INSERT / REPLACE / REMOVE PACEMAKER  ?2012   "I  think it was placed in 2012"   KNEE ARTHROSCOPY Left 04/03/2020   Procedure: LEFT KNEE ARTHROSCOPY  WASHOUT;  Surgeon: Tania Ade, MD;  Location: [facility];  Service: Orthopedics;  Laterality: Left;   LEFT HEART CATH AND CORONARY ANGIOGRAPHY N/A 08/30/2017   Procedure: LEFT HEART CATH AND CORONARY ANGIOGRAPHY;  Surgeon: Adrian Prows, MD;  Location: [place];  Service: Cardiovascular;  Laterality: N/A;    LEFT HEART CATH AND CORS/GRAFTS ANGIOGRAPHY N/A 08/05/2017   Procedure: LEFT HEART CATH AND CORS/GRAFTS ANGIOGRAPHY;  Surgeon: Adrian Prows, MD;  Location: [place] [facility];  Service: Cardiovascular;  Laterality: N/A;   Family History  Problem Relation Age of Onset   Lung cancer Mother    Stroke Father    Febrile seizures Brother     Social History   Tobacco Use   Smoking status: Former    Packs/day: 0.50    Years: 3.00    Pack years: 1.50    Types: Cigarettes    Quit date: 1960    Years since quitting: 62.9   Smokeless tobacco: Never  Substance Use Topics   Alcohol use: No   Marital Status: Widowed ROS  Review of Systems  Cardiovascular:  Positive for chest pain and dyspnea on exertion. Negative for claudication, leg swelling, near-syncope, orthopnea, palpitations, paroxysmal nocturnal dyspnea and syncope.  Gastrointestinal:  Negative for melena.  Objective  Blood pressure (!) 133/57, pulse 65, temperature 98.3 F (36.8 C), temperature source Temporal, resp. rate 16, height '6\' 2"'  (1.88 m), weight 174 lb (78.9 kg), SpO2 99 %.  Vitals with BMI 06/08/2021 06/08/2021 06/08/2021  Height - - -  Weight - - -  BMI - - -  Systolic 202 334 356  Diastolic 57 64 58  Pulse 65 55 65     Physical Exam Vitals reviewed.  Constitutional:      Appearance: He is well-developed.  Neck:     Vascular: No carotid bruit or JVD.  Cardiovascular:     Rate and Rhythm: Normal rate and regular rhythm.     Pulses: Intact distal pulses.          Popliteal pulses are 2+ on the right side and 2+ on the left side.       Dorsalis pedis pulses are 2+ on the right side and 1+ on the left side.       Posterior tibial pulses are 0 on the right side and 0 on the left side.     Heart sounds: Heart sounds are distant. No murmur heard. Early systolic murmur is present at the upper right sternal border.    No gallop.     Comments: No JVD. Pulmonary:     Effort: Pulmonary effort is normal.     Breath  sounds: Normal breath sounds.  Abdominal:     General: Bowel sounds are normal.     Palpations: Abdomen is soft.  Musculoskeletal:        General: Normal range of motion.     Right lower leg: No edema.     Left lower leg: No edema.   Laboratory examination:   CMP Latest Ref Rng & Units 04/07/2020 04/06/2020 04/05/2020  Glucose 70 - 99 mg/dL 125(H) 109(H) 102(H)  BUN 8 - 23 mg/dL '19 18 22  ' Creatinine 0.61 - 1.24 mg/dL 0.78 0.73 0.82  Sodium 135 - 145 mmol/L 135 133(L) 136  Potassium 3.5 - 5.1 mmol/L 3.9 3.6 3.7  Chloride 98 - 111 mmol/L 102 99  101  CO2 22 - 32 mmol/L '25 25 26  ' Calcium 8.9 - 10.3 mg/dL 8.6(L) 8.4(L) 8.4(L)  Total Protein 6.5 - 8.1 g/dL - 5.5(L) 5.0(L)  Total Bilirubin 0.3 - 1.2 mg/dL - 1.1 1.5(H)  Alkaline Phos 38 - 126 U/L - 94 90  AST 15 - 41 U/L - 22 24  ALT 0 - 44 U/L - 30 34   CBC Latest Ref Rng & Units 04/06/2020 04/05/2020 04/04/2020  WBC 4.0 - 10.5 K/uL 5.7 6.5 8.1  Hemoglobin 13.0 - 17.0 g/dL 10.9(L) 10.8(L) 11.6(L)  Hematocrit 39.0 - 52.0 % 33.1(L) 32.7(L) 35.3(L)  Platelets 150 - 400 K/uL 260 289 346   Lipid Panel     Component Value Date/Time   CHOL 108 02/05/2015 1119   TRIG 226 (H) 02/05/2015 1119   HDL 21 (L) 02/05/2015 1119   CHOLHDL 5.1 02/05/2015 1119   VLDL 45 (H) 02/05/2015 1119   LDLCALC 42 02/05/2015 1119   HEMOGLOBIN A1C Lab Results  Component Value Date   HGBA1C 6.4 (H) 04/02/2020   MPG 136.98 04/02/2020   TSH No results for input(s): TSH in the last 8760 hours.  External labs:  12/03/2020: A1c 6.7%. Total cholesterol 95, triglycerides 108, HDL 40, LDL 33. Hb 12.7/HCT 37.4, platelets 142.  Normal indicis. Serum glucose 144, BUN 21, creatinine 1.02, EGFR 69 mL, potassium 4.4.  CMP normal.  Allergies   Allergies  Allergen Reactions   Brilinta [Ticagrelor] Shortness Of Breath    Difficulty breathing     Medications Prior to Visit:   Outpatient Medications Prior to Visit  Medication Sig Dispense Refill   Biotin 5000  MCG TABS Take 5,000 mcg by mouth daily.     dorzolamide-timolol (COSOPT) 22.3-6.8 MG/ML ophthalmic solution Place 1 drop into both eyes 2 (two) times daily.  10   ezetimibe (ZETIA) 10 MG tablet TAKE 1 TABLET BY MOUTH EVERY DAY AFTER SUPPER 30 tablet 2   latanoprost (XALATAN) 0.005 % ophthalmic solution Place 1 drop into both eyes at bedtime.  12   losartan (COZAAR) 25 MG tablet TAKE 1 TABLET BY MOUTH DAILY 90 tablet 1   melatonin 5 MG TABS Take 5 mg by mouth at bedtime.      metoprolol tartrate (LOPRESSOR) 25 MG tablet TAKE 1 TABLET BY MOUTH TWICE DAILY 180 tablet 3   nitroGLYCERIN (NITROSTAT) 0.4 MG SL tablet Place 1 tablet (0.4 mg total) under the tongue every 5 (five) minutes as needed for chest pain. 25 tablet 3   rosuvastatin (CRESTOR) 20 MG tablet Take 1 tablet by mouth at bedtime.     TRUE METRIX BLOOD GLUCOSE TEST test strip TEST 3 TIMES D     vitamin B-12 (CYANOCOBALAMIN) 1000 MCG tablet Take 1,000 mcg by mouth daily.     XARELTO 20 MG TABS tablet TAKE 1 TABLET(20 MG) BY MOUTH DAILY WITH SUPPER (Patient taking differently: Take 20 mg by mouth daily with supper.) 30 tablet 3   metFORMIN (GLUCOPHAGE-XR) 500 MG 24 hr tablet Take 1 tablet (500 mg total) by mouth daily. (Patient taking differently: Take 500 mg by mouth in the morning and at bedtime. 1 IN THE MORNING 1 AT NIGHT)  1   HYDROcodone-acetaminophen (NORCO) 5-325 MG tablet Take 1 tablet by mouth every 6 (six) hours as needed for moderate pain. 20 tablet 0   traMADol (ULTRAM) 50 MG tablet Take 1 tablet (50 mg total) by mouth every 6 (six) hours as needed for moderate pain. 20 tablet 0   No  facility-administered medications prior to visit.   Final Medications at End of Visit    Current Meds  Medication Sig   Biotin 5000 MCG TABS Take 5,000 mcg by mouth daily.   dorzolamide-timolol (COSOPT) 22.3-6.8 MG/ML ophthalmic solution Place 1 drop into both eyes 2 (two) times daily.   ezetimibe (ZETIA) 10 MG tablet TAKE 1 TABLET BY MOUTH  EVERY DAY AFTER SUPPER   isosorbide mononitrate (IMDUR) 30 MG 24 hr tablet Take 1 tablet (30 mg total) by mouth daily.   latanoprost (XALATAN) 0.005 % ophthalmic solution Place 1 drop into both eyes at bedtime.   losartan (COZAAR) 25 MG tablet TAKE 1 TABLET BY MOUTH DAILY   melatonin 5 MG TABS Take 5 mg by mouth at bedtime.    metoprolol tartrate (LOPRESSOR) 25 MG tablet TAKE 1 TABLET BY MOUTH TWICE DAILY   nitroGLYCERIN (NITROSTAT) 0.4 MG SL tablet Place 1 tablet (0.4 mg total) under the tongue every 5 (five) minutes as needed for chest pain.   rosuvastatin (CRESTOR) 20 MG tablet Take 1 tablet by mouth at bedtime.   TRUE METRIX BLOOD GLUCOSE TEST test strip TEST 3 TIMES D   vitamin B-12 (CYANOCOBALAMIN) 1000 MCG tablet Take 1,000 mcg by mouth daily.   XARELTO 20 MG TABS tablet TAKE 1 TABLET(20 MG) BY MOUTH DAILY WITH SUPPER (Patient taking differently: Take 20 mg by mouth daily with supper.)   Radiology:   No results found.  Cardiac Studies:   Coronary Angiogram 11/29/2017:   (CABG 2007 [place], [place]): LIMA to LAD, Occluded SVG to RCA and OM by angiogram S/P  PCI/orbital atherectomy S/P 2.5x15 Resoluute to prox Cx on 11/29/2017.  Lexiscan/modified Bruce Tetrofosmin stress test 02/06/2020: Lexiscan/modified Bruce nuclear stress test performed using 1-day protocol. Stress EKG is non-diagnostic, as this is pharmacological stress test. In addition, stress EKG at 110% MPHR showed AV paced rhythm.  SPECT images show medium sized, mild intensity, partially reversible perfusion defect in apical to basal inferior and basal inferoseptal myocardium. While septal perfusion defect may be due to paced rhythm, ischemia cannot be excluded. Recommend clinical correlation.  Stress LVEF 44%. High risk study. No significant change from 02/14/2017.   Echocardiogram 02/06/2020:  Normal LV systolic function with visual EF 55-60%. Left ventricle cavity  is normal in size. Mild left ventricular hypertrophy. Normal  global wall  motion. Indeterminate diastolic filling pattern, indeterminate LAP. Calculated EF 55%.  Left atrial cavity is normal in size. A lipomatous septum is present.  Mild to moderate aortic regurgitation.  Moderate tricuspid regurgitation.  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   Pacemaker Medtronic dual chamber adapta [id number] on 11/20/2009   Remote dual-chamber pacemaker transmission 01/11/2021: Longevity 12 months. Lead impedance and thresholds within normal limits. AP 41%, VP 80%. There were no mode switches or high ventricular rate episodes. Normal pacemaker function.  Scheduled In office pacemaker check 06/04/21  Single (S)/Dual (D)/BV: D. Presenting ASVP. Pacemaker dependant: Yes. Underlying SR with CHB. AP 43%, VP 79%. . AMS Episodes 0.  HVR 0 Longevity 30 days. Magnet rate: >85%. Lead measurements: Stable. Histogram: Low (L)/normal (N)/high (H) Normal. Patient activity NA.   Observations: Normal pacemaker function, EOL approaching. Changes: None. Will refer for generator change.   EKG  06/09/2021: Ventricularly paced rhythm at a rate of 65 bpm.  No further analysis.  EKG 12/18/2020: Atrial fibrillation with ventricularly  paced rhythm.  No further analysis.    09/20/2019: Atrially paced rhythm at rate of 78 bpm, left axis deviation, left anterior fascicular block.  Right bundle branch block.  No evidence of ischemia.   No significant change from  EKG 03/19/2019   Assessment     ICD-10-CM   1. Angina pectoris (HCC)  I20.9     2. Coronary artery disease of native artery of native heart with stable angina pectoris (HCC)  I25.118 Troponin T    CBC    Basic metabolic panel    PCV ECHOCARDIOGRAM COMPLETE    3. Shortness of breath  R06.02 EKG 12-Lead    Brain natriuretic peptide    Troponin T    CBC    Basic metabolic panel    PCV  ECHOCARDIOGRAM COMPLETE       Meds ordered this encounter  Medications   isosorbide mononitrate (IMDUR) 30 MG 24 hr tablet    Sig: Take 1 tablet (30 mg total) by mouth daily.    Dispense:  30 tablet    Refill:  3    Medications Discontinued During This Encounter  Medication Reason   HYDROcodone-acetaminophen ([place]) 5-325 MG tablet Error   traMADol (ULTRAM) 50 MG tablet Error     Recommendations:   Jesse [name]  is a 80 y.o. male  with coronary artery disease and CABG in 2007 in [place] with LIMA to LAD, SVG to OM which is occluded by angiography on 02/04/2015, occluded SVG to RCA. He underwent complex successful arthrectomy and PCI to Cx on 11/29/2017.  His past medical history also includes sick sinus syndrome status post Medtronic pacemaker implantation 2011, PAF, diabetes mellitus, hypertension and hyperlipidemia.  Last nuclear stress test and echocardiogram on 02/06/2020, had inferior ischemia and normal LV systolic function, EF had improved from 45% to 55% by echo compared to 2019.  Patient had previously opted out of left heart catheterization following discussion of risks versus benefits as he was asymptomatic at the time.  Patient now presents for urgent visit with concerns of dyspnea even with minimal exertion as well as intermittent episodes of exertional chest pain relieved with rest.  Patient's EKG is unchanged compared to previous, however he is ventricularly paced.  Given patient's symptoms concerning for angina pectoris and history of abnormal nuclear stress test discussed again left heart catheterization.  The left heart catheterization procedure was explained to the patient in detail. The indication, alternatives, risks and benefits were reviewed. Complications including but not limited to bleeding, infection, acute kidney injury, blood transfusion, heart rhythm disturbances, contrast (dye) reaction, damage to the arteries or nerves in the legs or hands, cerebrovascular  accident, myocardial infarction, need for emergent bypass surgery, blood clots in the legs, possible need for emergent blood transfusion, and rarely death were reviewed and discussed with the patient. The patient voices understanding and wishes to proceed.  We will also obtain CBC, BMP, BNP, and troponin.  Counseled patient regarding signs and symptoms that would warrant urgent or emergent evaluation, he verbalized understanding agreement.  Will obtain echocardiogram to reevaluate LVEF and underlying structural disease.  Given chest discomfort we will add Imdur 30 mg daily.  Notably discussed with patient interactions with Viagra and Cialis like medications, he verbalized understanding.  Follow-up in approximately 4 weeks.   Alethia Berthold, PA-C 06/09/2021, 9:42 AM Office: [phone]  Addendum 06/29/2021:  Following last office visit patient was seen and evaluated by Dr. Crissie Sickles for pacemaker generator exchange.  He underwent pacemaker  generator change out on 06/17/2021.  Patient is currently scheduled for cardiac catheterization tomorrow as he was experiencing dyspnea on exertion and chest pain at last office visit.  I called and discussed with patient symptoms to follow-up after pacemaker generator change out.  Patient reports he continues to have dyspnea on exertion, although his chest pain symptoms have improved and are now occurring less frequently he does continue to have some episodes of exertional chest pain.  At last office visit added Imdur 30 mg daily which is likely contributing to improvement of chest pain.  As patient continues to have dyspnea on exertion and occasional episodes of chest pain shared decision was to proceed with cardiac catheterization as scheduled tomorrow.  I have discussed this with Dr. Einar Gip and he is aware.    Alethia Berthold, PA-C 06/29/2021, 4:37 PM Office: [phone]

## 2021-06-08 NOTE — Progress Notes (Addendum)
Primary Physician/Referring:  Deland Pretty, MD  Patient ID: Jesse [name], male    DOB: Nov 11, 1940, 80 y.o.   MRN: [id number]  Chief Complaint  Patient presents with   Shortness of Breath   Dizziness   HPI:    Jesse [name]  is a 80 y.o. male  with coronary artery disease and CABG in 2007 in [place] with LIMA to LAD, SVG to OM which is occluded by angiography on 02/04/2015, occluded SVG to RCA. He underwent complex successful arthrectomy and PCI to Cx on 11/29/2017.  His past medical history also includes sick sinus syndrome status post Medtronic pacemaker implantation 2011, PAF, diabetes mellitus, hypertension and hyperlipidemia.  Last nuclear stress test and echocardiogram on 02/06/2020, had inferior ischemia and normal LV systolic function, EF had improved from 45% to 55% by echo compared to 2019.    Patient presents for urgent visit at his request with complaints of shortness of breath.  Last office visit patient was stable from a cardiovascular standpoint and no changes were made.  Patient reports he has been experiencing dyspnea on exertion even with minimal daily activity for the last 4 days.  He was seen in our office last week for pacemaker check, which revealed normal pacemaker function and no significant abnormalities.  Over the last 4 days patient also reports 2 episodes of "chest tightness" while walking, relieved by rest.    Past Medical History:  Diagnosis Date   Angina pectoris ([place]) 02/03/2015   CAD (coronary artery disease), native coronary artery 08/30/2018   2007 [place], [place]: LIMA to LAD, Occluded SVG to RCA and OM by angiogram S/P  PCI/orbital atherectomy S/P 2.5x15 Resoluute to prox Cx on 11/29/2017.   Coronary artery disease    High cholesterol    Hypertension    Myocardial infarction [facility]) 2017/2018?   NSTEMI (non-ST elevated myocardial infarction) ([place]) 02/03/2015   Presence of permanent cardiac pacemaker    Type II diabetes mellitus ([place])    Past Surgical  History:  Procedure Laterality Date   CARDIAC CATHETERIZATION N/A 02/04/2015   Procedure: Left Heart Cath and Cors/Grafts Angiography;  Surgeon: Adrian Prows, MD;  Location: [facility];  Service: Cardiovascular;  Laterality: N/A;   CARDIAC CATHETERIZATION N/A 02/04/2015   Procedure: Coronary Stent Intervention;  Surgeon: Adrian Prows, MD;  Location: [facility];  Service: Cardiovascular;  Laterality: N/A;   CORONARY ANGIOGRAPHY N/A 10/18/2017   Procedure: CORONARY ANGIOGRAPHY;  Surgeon: Adrian Prows, MD;  Location: [facility];  Service: Cardiovascular;  Laterality: N/A;   CORONARY ANGIOPLASTY     CORONARY ANGIOPLASTY WITH STENT PLACEMENT     "i've got 5-6 stents total" (08/30/2017)   CORONARY ARTERY BYPASS GRAFT  2007   CABG X3   CORONARY ATHERECTOMY N/A 10/18/2017   Procedure: CORONARY ATHERECTOMY;  Surgeon: Adrian Prows, MD;  Location: [facility];  Service: Cardiovascular;  Laterality: N/A;   CORONARY ATHERECTOMY N/A 11/29/2017   Procedure: CORONARY ATHERECTOMY;  Surgeon: Adrian Prows, MD;  Location: [facility];  Service: Cardiovascular;  Laterality: N/A;   CORONARY CTO INTERVENTION N/A 08/30/2017   Procedure: CORONARY CTO INTERVENTION;  Surgeon: Adrian Prows, MD;  Location: [facility];  Service: Cardiovascular;  Laterality: N/A;   CORONARY STENT INTERVENTION N/A 11/29/2017   Procedure: CORONARY STENT INTERVENTION;  Surgeon: Adrian Prows, MD;  Location: [facility];  Service: Cardiovascular;  Laterality: N/A;   INSERT / REPLACE / REMOVE PACEMAKER  ?2012   "I  think it was placed in 2012"   KNEE ARTHROSCOPY Left 04/03/2020   Procedure: LEFT KNEE ARTHROSCOPY  WASHOUT;  Surgeon: Tania Ade, MD;  Location: [facility];  Service: Orthopedics;  Laterality: Left;   LEFT HEART CATH AND CORONARY ANGIOGRAPHY N/A 08/30/2017   Procedure: LEFT HEART CATH AND CORONARY ANGIOGRAPHY;  Surgeon: Adrian Prows, MD;  Location: [facility];  Service: Cardiovascular;  Laterality: N/A;    LEFT HEART CATH AND CORS/GRAFTS ANGIOGRAPHY N/A 08/05/2017   Procedure: LEFT HEART CATH AND CORS/GRAFTS ANGIOGRAPHY;  Surgeon: Adrian Prows, MD;  Location: [facility];  Service: Cardiovascular;  Laterality: N/A;   Family History  Problem Relation Age of Onset   Lung cancer Mother    Stroke Father    Febrile seizures Brother     Social History   Tobacco Use   Smoking status: Former    Packs/day: 0.50    Years: 3.00    Pack years: 1.50    Types: Cigarettes    Quit date: 1960    Years since quitting: 62.9   Smokeless tobacco: Never  Substance Use Topics   Alcohol use: No   Marital Status: Widowed ROS  Review of Systems  Cardiovascular:  Positive for chest pain and dyspnea on exertion. Negative for claudication, leg swelling, near-syncope, orthopnea, palpitations, paroxysmal nocturnal dyspnea and syncope.  Gastrointestinal:  Negative for melena.  Objective  Blood pressure (!) 133/57, pulse 65, temperature 98.3 F (36.8 C), temperature source Temporal, resp. rate 16, height '6\' 2"'  (1.88 m), weight 174 lb (78.9 kg), SpO2 99 %.  Vitals with BMI 06/08/2021 06/08/2021 06/08/2021  Height - - -  Weight - - -  BMI - - -  Systolic 277 412 878  Diastolic 57 64 58  Pulse 65 55 65     Physical Exam Vitals reviewed.  Constitutional:      Appearance: He is well-developed.  Neck:     Vascular: No carotid bruit or JVD.  Cardiovascular:     Rate and Rhythm: Normal rate and regular rhythm.     Pulses: Intact distal pulses.          Popliteal pulses are 2+ on the right side and 2+ on the left side.       Dorsalis pedis pulses are 2+ on the right side and 1+ on the left side.       Posterior tibial pulses are 0 on the right side and 0 on the left side.     Heart sounds: Heart sounds are distant. No murmur heard. Early systolic murmur is present at the upper right sternal border.    No gallop.     Comments: No JVD. Pulmonary:     Effort: Pulmonary effort is normal.     Breath  sounds: Normal breath sounds.  Abdominal:     General: Bowel sounds are normal.     Palpations: Abdomen is soft.  Musculoskeletal:        General: Normal range of motion.     Right lower leg: No edema.     Left lower leg: No edema.   Laboratory examination:   CMP Latest Ref Rng & Units 04/07/2020 04/06/2020 04/05/2020  Glucose 70 - 99 mg/dL 125(H) 109(H) 102(H)  BUN 8 - 23 mg/dL '19 18 22  ' Creatinine 0.61 - 1.24 mg/dL 0.78 0.73 0.82  Sodium 135 - 145 mmol/L 135 133(L) 136  Potassium 3.5 - 5.1 mmol/L 3.9 3.6 3.7  Chloride 98 - 111 mmol/L 102 99  101  CO2 22 - 32 mmol/L '25 25 26  ' Calcium 8.9 - 10.3 mg/dL 8.6(L) 8.4(L) 8.4(L)  Total Protein 6.5 - 8.1 g/dL - 5.5(L) 5.0(L)  Total Bilirubin 0.3 - 1.2 mg/dL - 1.1 1.5(H)  Alkaline Phos 38 - 126 U/L - 94 90  AST 15 - 41 U/L - 22 24  ALT 0 - 44 U/L - 30 34   CBC Latest Ref Rng & Units 04/06/2020 04/05/2020 04/04/2020  WBC 4.0 - 10.5 K/uL 5.7 6.5 8.1  Hemoglobin 13.0 - 17.0 g/dL 10.9(L) 10.8(L) 11.6(L)  Hematocrit 39.0 - 52.0 % 33.1(L) 32.7(L) 35.3(L)  Platelets 150 - 400 K/uL 260 289 346   Lipid Panel     Component Value Date/Time   CHOL 108 02/05/2015 1119   TRIG 226 (H) 02/05/2015 1119   HDL 21 (L) 02/05/2015 1119   CHOLHDL 5.1 02/05/2015 1119   VLDL 45 (H) 02/05/2015 1119   LDLCALC 42 02/05/2015 1119   HEMOGLOBIN A1C Lab Results  Component Value Date   HGBA1C 6.4 (H) 04/02/2020   MPG 136.98 04/02/2020   TSH No results for input(s): TSH in the last 8760 hours.  External labs:  12/03/2020: A1c 6.7%. Total cholesterol 95, triglycerides 108, HDL 40, LDL 33. Hb 12.7/HCT 37.4, platelets 142.  Normal indicis. Serum glucose 144, BUN 21, creatinine 1.02, EGFR 69 mL, potassium 4.4.  CMP normal.  Allergies   Allergies  Allergen Reactions   Brilinta [Ticagrelor] Shortness Of Breath    Difficulty breathing     Medications Prior to Visit:   Outpatient Medications Prior to Visit  Medication Sig Dispense Refill   Biotin 5000  MCG TABS Take 5,000 mcg by mouth daily.     dorzolamide-timolol (COSOPT) 22.3-6.8 MG/ML ophthalmic solution Place 1 drop into both eyes 2 (two) times daily.  10   ezetimibe (ZETIA) 10 MG tablet TAKE 1 TABLET BY MOUTH EVERY DAY AFTER SUPPER 30 tablet 2   latanoprost (XALATAN) 0.005 % ophthalmic solution Place 1 drop into both eyes at bedtime.  12   losartan (COZAAR) 25 MG tablet TAKE 1 TABLET BY MOUTH DAILY 90 tablet 1   melatonin 5 MG TABS Take 5 mg by mouth at bedtime.      metoprolol tartrate (LOPRESSOR) 25 MG tablet TAKE 1 TABLET BY MOUTH TWICE DAILY 180 tablet 3   nitroGLYCERIN (NITROSTAT) 0.4 MG SL tablet Place 1 tablet (0.4 mg total) under the tongue every 5 (five) minutes as needed for chest pain. 25 tablet 3   rosuvastatin (CRESTOR) 20 MG tablet Take 1 tablet by mouth at bedtime.     TRUE METRIX BLOOD GLUCOSE TEST test strip TEST 3 TIMES D     vitamin B-12 (CYANOCOBALAMIN) 1000 MCG tablet Take 1,000 mcg by mouth daily.     XARELTO 20 MG TABS tablet TAKE 1 TABLET(20 MG) BY MOUTH DAILY WITH SUPPER (Patient taking differently: Take 20 mg by mouth daily with supper.) 30 tablet 3   metFORMIN (GLUCOPHAGE-XR) 500 MG 24 hr tablet Take 1 tablet (500 mg total) by mouth daily. (Patient taking differently: Take 500 mg by mouth in the morning and at bedtime. 1 IN THE MORNING 1 AT NIGHT)  1   HYDROcodone-acetaminophen (NORCO) 5-325 MG tablet Take 1 tablet by mouth every 6 (six) hours as needed for moderate pain. 20 tablet 0   traMADol (ULTRAM) 50 MG tablet Take 1 tablet (50 mg total) by mouth every 6 (six) hours as needed for moderate pain. 20 tablet 0   No  facility-administered medications prior to visit.   Final Medications at End of Visit    Current Meds  Medication Sig   Biotin 5000 MCG TABS Take 5,000 mcg by mouth daily.   dorzolamide-timolol (COSOPT) 22.3-6.8 MG/ML ophthalmic solution Place 1 drop into both eyes 2 (two) times daily.   ezetimibe (ZETIA) 10 MG tablet TAKE 1 TABLET BY MOUTH  EVERY DAY AFTER SUPPER   isosorbide mononitrate (IMDUR) 30 MG 24 hr tablet Take 1 tablet (30 mg total) by mouth daily.   latanoprost (XALATAN) 0.005 % ophthalmic solution Place 1 drop into both eyes at bedtime.   losartan (COZAAR) 25 MG tablet TAKE 1 TABLET BY MOUTH DAILY   melatonin 5 MG TABS Take 5 mg by mouth at bedtime.    metoprolol tartrate (LOPRESSOR) 25 MG tablet TAKE 1 TABLET BY MOUTH TWICE DAILY   nitroGLYCERIN (NITROSTAT) 0.4 MG SL tablet Place 1 tablet (0.4 mg total) under the tongue every 5 (five) minutes as needed for chest pain.   rosuvastatin (CRESTOR) 20 MG tablet Take 1 tablet by mouth at bedtime.   TRUE METRIX BLOOD GLUCOSE TEST test strip TEST 3 TIMES D   vitamin B-12 (CYANOCOBALAMIN) 1000 MCG tablet Take 1,000 mcg by mouth daily.   XARELTO 20 MG TABS tablet TAKE 1 TABLET(20 MG) BY MOUTH DAILY WITH SUPPER (Patient taking differently: Take 20 mg by mouth daily with supper.)   Radiology:   No results found.  Cardiac Studies:   Coronary Angiogram 11/29/2017:   (CABG 2007 [place], [place]): LIMA to LAD, Occluded SVG to RCA and OM by angiogram S/P  PCI/orbital atherectomy S/P 2.5x15 Resoluute to prox Cx on 11/29/2017.  Lexiscan/modified Bruce Tetrofosmin stress test 02/06/2020: Lexiscan/modified Bruce nuclear stress test performed using 1-day protocol. Stress EKG is non-diagnostic, as this is pharmacological stress test. In addition, stress EKG at 110% MPHR showed AV paced rhythm.  SPECT images show medium sized, mild intensity, partially reversible perfusion defect in apical to basal inferior and basal inferoseptal myocardium. While septal perfusion defect may be due to paced rhythm, ischemia cannot be excluded. Recommend clinical correlation.  Stress LVEF 44%. High risk study. No significant change from 02/14/2017.   Echocardiogram 02/06/2020:  Normal LV systolic function with visual EF 55-60%. Left ventricle cavity  is normal in size. Mild left ventricular hypertrophy. Normal  global wall  motion. Indeterminate diastolic filling pattern, indeterminate LAP. Calculated EF 55%.  Left atrial cavity is normal in size. A lipomatous septum is present.  Mild to moderate aortic regurgitation.  Moderate tricuspid regurgitation.  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   Pacemaker Medtronic dual chamber adapta [id number] on 11/20/2009   Remote dual-chamber pacemaker transmission 01/11/2021: Longevity 12 months. Lead impedance and thresholds within normal limits. AP 41%, VP 80%. There were no mode switches or high ventricular rate episodes. Normal pacemaker function.  Scheduled In office pacemaker check 06/04/21  Single (S)/Dual (D)/BV: D. Presenting ASVP. Pacemaker dependant: Yes. Underlying SR with CHB. AP 43%, VP 79%. . AMS Episodes 0.  HVR 0 Longevity 30 days. Magnet rate: >85%. Lead measurements: Stable. Histogram: Low (L)/normal (N)/high (H) Normal. Patient activity NA.   Observations: Normal pacemaker function, EOL approaching. Changes: None. Will refer for generator change.   EKG  06/09/2021: Ventricularly paced rhythm at a rate of 65 bpm.  No further analysis.  EKG 12/18/2020: Atrial fibrillation with ventricularly  paced rhythm.  No further analysis.    09/20/2019: Atrially paced rhythm at rate of 78 bpm, left axis deviation, left anterior fascicular block.  Right bundle branch block.  No evidence of ischemia.   No significant change from  EKG 03/19/2019   Assessment     ICD-10-CM   1. Angina pectoris (HCC)  I20.9     2. Coronary artery disease of native artery of native heart with stable angina pectoris (HCC)  I25.118 Troponin T    CBC    Basic metabolic panel    PCV ECHOCARDIOGRAM COMPLETE    3. Shortness of breath  R06.02 EKG 12-Lead    Brain natriuretic peptide    Troponin T    CBC    Basic metabolic panel    PCV  ECHOCARDIOGRAM COMPLETE       Meds ordered this encounter  Medications   isosorbide mononitrate (IMDUR) 30 MG 24 hr tablet    Sig: Take 1 tablet (30 mg total) by mouth daily.    Dispense:  30 tablet    Refill:  3    Medications Discontinued During This Encounter  Medication Reason   HYDROcodone-acetaminophen ([place]) 5-325 MG tablet Error   traMADol (ULTRAM) 50 MG tablet Error     Recommendations:   Jesse [name]  is a 80 y.o. male  with coronary artery disease and CABG in 2007 in [place] with LIMA to LAD, SVG to OM which is occluded by angiography on 02/04/2015, occluded SVG to RCA. He underwent complex successful arthrectomy and PCI to Cx on 11/29/2017.  His past medical history also includes sick sinus syndrome status post Medtronic pacemaker implantation 2011, PAF, diabetes mellitus, hypertension and hyperlipidemia.  Last nuclear stress test and echocardiogram on 02/06/2020, had inferior ischemia and normal LV systolic function, EF had improved from 45% to 55% by echo compared to 2019.  Patient had previously opted out of left heart catheterization following discussion of risks versus benefits as he was asymptomatic at the time.  Patient now presents for urgent visit with concerns of dyspnea even with minimal exertion as well as intermittent episodes of exertional chest pain relieved with rest.  Patient's EKG is unchanged compared to previous, however he is ventricularly paced.  Given patient's symptoms concerning for angina pectoris and history of abnormal nuclear stress test discussed again left heart catheterization.  The left heart catheterization procedure was explained to the patient in detail. The indication, alternatives, risks and benefits were reviewed. Complications including but not limited to bleeding, infection, acute kidney injury, blood transfusion, heart rhythm disturbances, contrast (dye) reaction, damage to the arteries or nerves in the legs or hands, cerebrovascular  accident, myocardial infarction, need for emergent bypass surgery, blood clots in the legs, possible need for emergent blood transfusion, and rarely death were reviewed and discussed with the patient. The patient voices understanding and wishes to proceed.  We will also obtain CBC, BMP, BNP, and troponin.  Counseled patient regarding signs and symptoms that would warrant urgent or emergent evaluation, he verbalized understanding agreement.  Will obtain echocardiogram to reevaluate LVEF and underlying structural disease.  Given chest discomfort we will add Imdur 30 mg daily.  Notably discussed with patient interactions with Viagra and Cialis like medications, he verbalized understanding.  Follow-up in approximately 4 weeks.   Alethia Berthold, PA-C 06/09/2021, 9:42 AM Office: [phone]  Addendum 06/29/2021:  Following last office visit patient was seen and evaluated by Dr. Crissie Sickles for pacemaker generator exchange.  He underwent pacemaker  generator change out on 06/17/2021.  Patient is currently scheduled for cardiac catheterization tomorrow as he was experiencing dyspnea on exertion and chest pain at last office visit.  I called and discussed with patient symptoms to follow-up after pacemaker generator change out.  Patient reports he continues to have dyspnea on exertion, although his chest pain symptoms have improved and are now occurring less frequently he does continue to have some episodes of exertional chest pain.  At last office visit added Imdur 30 mg daily which is likely contributing to improvement of chest pain.  As patient continues to have dyspnea on exertion and occasional episodes of chest pain shared decision was to proceed with cardiac catheterization as scheduled tomorrow.  I have discussed this with Dr. Einar Gip and he is aware.    Alethia Berthold, PA-C 06/29/2021, 4:37 PM Office: [phone]

## 2021-06-15 ENCOUNTER — Encounter: Payer: Self-pay | Admitting: Internal Medicine

## 2021-06-15 ENCOUNTER — Other Ambulatory Visit: Payer: Self-pay

## 2021-06-15 ENCOUNTER — Ambulatory Visit (INDEPENDENT_AMBULATORY_CARE_PROVIDER_SITE_OTHER): Payer: Medicare Other | Admitting: Internal Medicine

## 2021-06-15 VITALS — BP 122/70 | HR 65 | Ht 74.0 in | Wt 173.4 lb

## 2021-06-15 DIAGNOSIS — R0602 Shortness of breath: Secondary | ICD-10-CM | POA: Diagnosis not present

## 2021-06-15 DIAGNOSIS — I495 Sick sinus syndrome: Secondary | ICD-10-CM | POA: Diagnosis not present

## 2021-06-15 DIAGNOSIS — I25810 Atherosclerosis of coronary artery bypass graft(s) without angina pectoris: Secondary | ICD-10-CM | POA: Diagnosis not present

## 2021-06-15 DIAGNOSIS — Z950 Presence of cardiac pacemaker: Secondary | ICD-10-CM

## 2021-06-15 LAB — CUP PACEART INCLINIC DEVICE CHECK
Battery Impedance: 8184 Ohm
Battery Voltage: 2.61 V
Brady Statistic RV Percent Paced: 99 %
Date Time Interrogation Session: 20221121104702
Implantable Pulse Generator Implant Date: 20110409
Lead Channel Impedance Value: 504 Ohm
Lead Channel Impedance Value: 67 Ohm
Lead Channel Setting Pacing Amplitude: 2 V
Lead Channel Setting Pacing Pulse Width: 0.4 ms
Lead Channel Setting Sensing Sensitivity: 2 mV

## 2021-06-15 NOTE — H&P (View-Only) (Signed)
HPI Jesse [name] is referred today by Dr. Einar Gip to consider pacemaker generator change out.  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  He has not had syncope.  The patient has reached elective replacement on his pacemaker.  He notes to be feeling well although he has been slightly more unsteady on his feet.  He denies chest pain or shortness of breath.  No syncope. Allergies  Allergen Reactions   Brilinta [Ticagrelor] Shortness Of Breath    Difficulty breathing      Current Outpatient Medications  Medication Sig Dispense Refill   Biotin 5000 MCG TABS Take 5,000 mcg by mouth daily.     dorzolamide-timolol (COSOPT) 22.3-6.8 MG/ML ophthalmic solution Place 1 drop into both eyes 2 (two) times daily.  10   ezetimibe (ZETIA) 10 MG tablet TAKE 1 TABLET BY MOUTH EVERY DAY AFTER SUPPER 30 tablet 2   isosorbide mononitrate (IMDUR) 30 MG 24 hr tablet Take 1 tablet (30 mg total) by mouth daily. 30 tablet 3   latanoprost (XALATAN) 0.005 % ophthalmic solution Place 1 drop into both eyes at bedtime.  12   losartan (COZAAR) 25 MG tablet TAKE 1 TABLET BY MOUTH DAILY 90 tablet 1   melatonin 5 MG TABS Take 5 mg by mouth at bedtime.      metFORMIN (GLUCOPHAGE-XR) 500 MG 24 hr tablet Take 1 tablet (500 mg total) by mouth daily. (Patient taking differently: Take 500 mg by mouth in the morning and at bedtime. 1 IN THE MORNING 1 AT NIGHT)  1   metoprolol tartrate (LOPRESSOR) 25 MG tablet TAKE 1 TABLET BY MOUTH TWICE DAILY 180 tablet 3   nitroGLYCERIN (NITROSTAT) 0.4 MG SL tablet Place 1 tablet (0.4 mg total) under the tongue every 5 (five) minutes as needed for chest pain. 25 tablet 3   rosuvastatin (CRESTOR) 20 MG tablet Take 1 tablet by mouth at bedtime.     TRUE METRIX BLOOD GLUCOSE TEST test strip TEST 3 TIMES D     vitamin B-12 (CYANOCOBALAMIN) 1000 MCG tablet Take 1,000 mcg by mouth daily.     XARELTO 20 MG TABS tablet TAKE 1  TABLET(20 MG) BY MOUTH DAILY WITH SUPPER (Patient taking differently: Take 20 mg by mouth daily with supper.) 30 tablet 3   No current facility-administered medications for this visit.     Past Medical History:  Diagnosis Date   Angina pectoris ([place]) 02/03/2015   CAD (coronary artery disease), native coronary artery 08/30/2018   2007 [place], [place]: LIMA to LAD, Occluded SVG to RCA and OM by angiogram S/P  PCI/orbital atherectomy S/P 2.5x15 Resoluute to prox Cx on 11/29/2017.   Coronary artery disease    High cholesterol    Hypertension    Myocardial infarction [facility]) 2017/2018?   NSTEMI (non-ST elevated myocardial infarction) ([place]) 02/03/2015   Presence of permanent cardiac pacemaker    Type II diabetes mellitus ([place])     ROS:   All systems reviewed and negative except as noted in the HPI.   Past Surgical History:  Procedure Laterality Date   CARDIAC CATHETERIZATION N/A 02/04/2015   Procedure: Left Heart Cath and Cors/Grafts Angiography;  Surgeon: Adrian Prows, MD;  Location: [place] [facility];  Service: Cardiovascular;  Laterality: N/A;   CARDIAC CATHETERIZATION N/A 02/04/2015   Procedure: Coronary Stent Intervention;  Surgeon: Adrian Prows, MD;  Location: [place] [facility];  Service: Cardiovascular;  Laterality: N/A;  CORONARY ANGIOGRAPHY N/A 10/18/2017   Procedure: CORONARY ANGIOGRAPHY;  Surgeon: Adrian Prows, MD;  Location:  CV LAB;  Service: Cardiovascular;  Laterality: N/A;   CORONARY ANGIOPLASTY     CORONARY ANGIOPLASTY WITH STENT PLACEMENT     "i've got 5-6 stents total" (08/30/2017)   CORONARY ARTERY BYPASS GRAFT  2007   CABG X3   CORONARY ATHERECTOMY N/A 10/18/2017   Procedure: CORONARY ATHERECTOMY;  Surgeon: Adrian Prows, MD;  Location: [place] [facility];  Service: Cardiovascular;  Laterality: N/A;   CORONARY ATHERECTOMY N/A 11/29/2017   Procedure: CORONARY ATHERECTOMY;  Surgeon: Adrian Prows, MD;  Location: [place] [facility];  Service: Cardiovascular;  Laterality: N/A;    CORONARY CTO INTERVENTION N/A 08/30/2017   Procedure: CORONARY CTO INTERVENTION;  Surgeon: Adrian Prows, MD;  Location: [place] [facility];  Service: Cardiovascular;  Laterality: N/A;   CORONARY STENT INTERVENTION N/A 11/29/2017   Procedure: CORONARY STENT INTERVENTION;  Surgeon: Adrian Prows, MD;  Location: [place] [facility];  Service: Cardiovascular;  Laterality: N/A;   INSERT / REPLACE / REMOVE PACEMAKER  ?2012   "I think it was placed in 2012"   KNEE ARTHROSCOPY Left 04/03/2020   Procedure: LEFT KNEE ARTHROSCOPY  WASHOUT;  Surgeon: Tania Ade, MD;  [facility];  Service: Orthopedics;  Laterality: Left;   LEFT HEART CATH AND CORONARY ANGIOGRAPHY N/A 08/30/2017   Procedure: LEFT HEART CATH AND CORONARY ANGIOGRAPHY;  Surgeon: Adrian Prows, MD;  Location: [place] [facility];  Service: Cardiovascular;  Laterality: N/A;   LEFT HEART CATH AND CORS/GRAFTS ANGIOGRAPHY N/A 08/05/2017   Procedure: LEFT HEART CATH AND CORS/GRAFTS ANGIOGRAPHY;  Surgeon: Adrian Prows, MD;  Location: [place] [facility];  Service: Cardiovascular;  Laterality: N/A;     Family History  Problem Relation Age of Onset   Lung cancer Mother    Stroke Father    Febrile seizures Brother      Social History   Socioeconomic History   Marital status: Widowed    Spouse name: Not on file   Number of children: 0   Years of education: Not on file   Highest education level: Not on file  Occupational History   Not on file  Tobacco Use   Smoking status: Former    Packs/day: 0.50    Years: 3.00    Pack years: 1.50    Types: Cigarettes    Quit date: 38    Years since quitting: 62.9   Smokeless tobacco: Never  Vaping Use   Vaping Use: Never used  Substance and Sexual Activity   Alcohol use: No   Drug use: No   Sexual activity: Yes  Other Topics Concern   Not on file  Social History Narrative   Not on file   Social Determinants of Health   Financial Resource Strain: Not on file  Food Insecurity: Not on file   Transportation Needs: Not on file  Physical Activity: Not on file  Stress: Not on file  Social Connections: Not on file  Intimate Partner Violence: Not on file     BP 122/70   Pulse 65   Ht 6\' 2"  (1.88 m)   Wt 173 lb 6.4 oz (78.7 kg)   SpO2 99%   BMI 22.26 kg/m   Physical Exam:  Well appearing NAD HEENT: Unremarkable Neck:  No JVD, no thyromegally Lymphatics:  No adenopathy Back:  No CVA tenderness Lungs:  Clear, with no wheezes, rales, or rhonchi HEART:  Regular rate rhythm, no  murmurs, no rubs, no clicks Abd:  soft, positive bowel sounds, no organomegally, no rebound, no guarding Ext:  2 plus pulses, no edema, no cyanosis, no clubbing Skin:  No rashes no nodules Neuro:  CN II through XII intact, motor grossly intact  EKG -probable atrial fibrillation with ventricular pacing  DEVICE  Normal device function.  See PaceArt for details.  End-of-life  Assess/Plan:  1.  Complete heart block -the patient appears to have no escape.  He will undergo pacemaker generator removal and insertion of a new dual-chamber pacemaker. 2.  Paroxysmal atrial fibrillation -he appears to be in atrial fibrillation today.  He has reverted to VVI 65 so we cannot know this for sure until he is undergone evaluation. 3.  Coronary artery disease -he is status post bypass surgery and denies anginal symptoms.  He has felt more poorly recently but I suspect this is related to his pacemaker function.  Crissie Sickles, MD

## 2021-06-15 NOTE — Patient Instructions (Addendum)
Medication Instructions:  Your physician recommends that you continue on your current medications as directed. Please refer to the Current Medication list given to you today.  Labwork: You will get lab work today:  CBC, BMP and BNP  Testing/Procedures: None ordered.  Follow-Up:  SEE INSTRUCTION LETTER  Any Other Special Instructions Will Be Listed Below (If Applicable).  If you need a refill on your cardiac medications before your next appointment, please call your pharmacy.   Pacemaker Battery Change A pacemaker battery usually lasts 5-15 years (6-7 years on average). A few times a year, you may be asked to visit your health care provider to have a full evaluation of your pacemaker. When the battery is low, your pacemaker will be completely replaced. Most often, this procedure is simpler than the first surgery because the wires (leads) that connect the pacemaker to the heart are already in place. There are many things that affect how long a pacemaker battery will last, including: The age of the pacemaker. The number of leads you have(1, 2, or 3). The use or workload of the pacemaker. If the pacemaker is helping the heart more often, the battery will not last as long. Power (voltage) settings. Tell a health care provider about: Any allergies you have. All medicines you are taking, including vitamins, herbs, eye drops, creams, and over-the-counter medicines. Any problems you or family members have had with anesthetic medicines. Any blood disorders you have. Any surgeries you have had, especially any surgeries you have had since your last pacemaker was placed. Any medical conditions you have. Whether you are pregnant or may be pregnant. What are the risks? Generally, this is a safe procedure. However, problems may occur, including: Bleeding. Infection. Nerve damage. Allergic reaction to medicines. Damage to the leads that go to the heart. What happens before the  procedure? Staying hydrated Follow instructions from your health care provider about hydration, which may include: Up to 2 hours before the procedure - you may continue to drink clear liquids, such as water, clear fruit juice, black coffee, and plain tea. Eating and drinking restrictions Follow instructions from your health care provider about eating and drinking restrictions, which may include: 8 hours before the procedure - stop eating heavy meals or foods, such as meat, fried foods, or fatty foods. 6 hours before the procedure - stop eating light meals or foods, such as toast or cereal. 6 hours before the procedure - stop drinking milk or drinks that contain milk. 2 hours before the procedure - stop drinking clear liquids. Medicines Ask your health care provider about: Changing or stopping your regular medicines. This is especially important if you are taking diabetes medicines or blood thinners. Taking medicines such as aspirin and ibuprofen. These medicines can thin your blood. Do not take these medicines unless your health care provider tells you to take them. Taking over-the-counter medicines, vitamins, herbs, and supplements. General instructions Ask your health care provider what steps will be taken to help prevent infection. These may include: Removing hair at the surgery site. Washing skin with a germ-killing soap. Receiving antibiotic medicine. Plan to have someone take you home from the hospital or clinic. If you will be going home right after the procedure, plan to have someone with you for 24 hours. What happens during the procedure? An IV will be inserted into one of your veins. You will be given one or more of the following: A medicine to help you relax (sedative). A medicine to numb the area where  the pacemaker is located (local anesthetic). Your health care provider will make an incision to reopen the pocket holding the pacemaker. The old pacemaker will be disconnected  from the leads. The leads will be tested. If needed, the leads will be replaced. If the leads are functioning properly, the new pacemaker will be connected to the existing leads. A heart monitor and a pacemaker programmer will be used to make sure that the newly implanted pacemaker is working properly. The incision site will be closed with stitches (sutures), adhesive strips, or skin glue. A bandage (dressing) will be placed over the pacemaker site. The procedure may vary among health care providers and hospitals. What happens after the procedure? Your blood pressure, heart rate, breathing rate, and blood oxygen level will be monitored until you leave the hospital or clinic. You may be given antibiotics. Your health care provider will tell you when your pacemaker will need to be tested again, or when to return to the office for removal of the dressing and sutures. If you were given a sedative during the procedure, it can affect you for several hours. Do not drive or operate machinery until your health care provider says that it is safe. You will be given a pacemaker identification card. This card lists the implant date, device model, and manufacturer of your pacemaker. Summary A pacemaker battery usually lasts 5-15 years (6-7 years on average). When the battery is low, your pacemaker will need to be replaced. Most often, this procedure is simpler than the first surgery because the wires (leads) that connect the pacemaker to the heart are already in place. Risks of this procedure include bleeding, infection, and allergic reactions to medicines. This information is not intended to replace advice given to you by your health care provider. Make sure you discuss any questions you have with your health care provider. Document Revised: 06/14/2019 Document Reviewed: 06/14/2019 Elsevier Patient Education  2022 [employer].

## 2021-06-15 NOTE — Progress Notes (Signed)
HPI [name] is referred today by Dr. Einar Gip to consider pacemaker generator change out.  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  He has not had syncope.  The patient has reached elective replacement on his pacemaker.  He notes to be feeling well although he has been slightly more unsteady on his feet.  He denies chest pain or shortness of breath.  No syncope. Allergies  Allergen Reactions   Brilinta [Ticagrelor] Shortness Of Breath    Difficulty breathing      Current Outpatient Medications  Medication Sig Dispense Refill   Biotin 5000 MCG TABS Take 5,000 mcg by mouth daily.     dorzolamide-timolol (COSOPT) 22.3-6.8 MG/ML ophthalmic solution Place 1 drop into both eyes 2 (two) times daily.  10   ezetimibe (ZETIA) 10 MG tablet TAKE 1 TABLET BY MOUTH EVERY DAY AFTER SUPPER 30 tablet 2   isosorbide mononitrate (IMDUR) 30 MG 24 hr tablet Take 1 tablet (30 mg total) by mouth daily. 30 tablet 3   latanoprost (XALATAN) 0.005 % ophthalmic solution Place 1 drop into both eyes at bedtime.  12   losartan (COZAAR) 25 MG tablet TAKE 1 TABLET BY MOUTH DAILY 90 tablet 1   melatonin 5 MG TABS Take 5 mg by mouth at bedtime.      metFORMIN (GLUCOPHAGE-XR) 500 MG 24 hr tablet Take 1 tablet (500 mg total) by mouth daily. (Patient taking differently: Take 500 mg by mouth in the morning and at bedtime. 1 IN THE MORNING 1 AT NIGHT)  1   metoprolol tartrate (LOPRESSOR) 25 MG tablet TAKE 1 TABLET BY MOUTH TWICE DAILY 180 tablet 3   nitroGLYCERIN (NITROSTAT) 0.4 MG SL tablet Place 1 tablet (0.4 mg total) under the tongue every 5 (five) minutes as needed for chest pain. 25 tablet 3   rosuvastatin (CRESTOR) 20 MG tablet Take 1 tablet by mouth at bedtime.     TRUE METRIX BLOOD GLUCOSE TEST test strip TEST 3 TIMES D     vitamin B-12 (CYANOCOBALAMIN) 1000 MCG tablet Take 1,000 mcg by mouth daily.     XARELTO 20 MG TABS tablet TAKE 1  TABLET(20 MG) BY MOUTH DAILY WITH SUPPER (Patient taking differently: Take 20 mg by mouth daily with supper.) 30 tablet 3   No current facility-administered medications for this visit.     Past Medical History:  Diagnosis Date   Angina pectoris ([place]) 02/03/2015   CAD (coronary artery disease), native coronary artery 08/30/2018   2007 [place], [place]: LIMA to LAD, Occluded SVG to RCA and OM by angiogram S/P  PCI/orbital atherectomy S/P 2.5x15 Resoluute to prox Cx on 11/29/2017.   Coronary artery disease    High cholesterol    Hypertension    Myocardial infarction [facility]) 2017/2018?   NSTEMI (non-ST elevated myocardial infarction) ([place]) 02/03/2015   Presence of permanent cardiac pacemaker    Type II diabetes mellitus ([place])     ROS:   All systems reviewed and negative except as noted in the HPI.   Past Surgical History:  Procedure Laterality Date   CARDIAC CATHETERIZATION N/A 02/04/2015   Procedure: Left Heart Cath and Cors/Grafts Angiography;  Surgeon: Adrian Prows, MD;  Location: [place] [facility];  Service: Cardiovascular;  Laterality: N/A;   CARDIAC CATHETERIZATION N/A 02/04/2015   Procedure: Coronary Stent Intervention;  Surgeon: Adrian Prows, MD;  Location: [place] [facility];  Service: Cardiovascular;  Laterality: N/A;  CORONARY ANGIOGRAPHY N/A 10/18/2017   Procedure: CORONARY ANGIOGRAPHY;  Surgeon: Adrian Prows, MD;  Location: [place] [facility];  Service: Cardiovascular;  Laterality: N/A;   CORONARY ANGIOPLASTY     CORONARY ANGIOPLASTY WITH STENT PLACEMENT     "i've got 5-6 stents total" (08/30/2017)   CORONARY ARTERY BYPASS GRAFT  2007   CABG X3   CORONARY ATHERECTOMY N/A 10/18/2017   Procedure: CORONARY ATHERECTOMY;  Surgeon: Adrian Prows, MD;  Location: [place] [facility];  Service: Cardiovascular;  Laterality: N/A;   CORONARY ATHERECTOMY N/A 11/29/2017   Procedure: CORONARY ATHERECTOMY;  Surgeon: Adrian Prows, MD;  Location: [place] [facility];  Service: Cardiovascular;  Laterality: N/A;    CORONARY CTO INTERVENTION N/A 08/30/2017   Procedure: CORONARY CTO INTERVENTION;  Surgeon: Adrian Prows, MD;  Location: [place] [facility];  Service: Cardiovascular;  Laterality: N/A;   CORONARY STENT INTERVENTION N/A 11/29/2017   Procedure: CORONARY STENT INTERVENTION;  Surgeon: Adrian Prows, MD;  Location: [place] [facility];  Service: Cardiovascular;  Laterality: N/A;   INSERT / REPLACE / REMOVE PACEMAKER  ?2012   "I think it was placed in 2012"   KNEE ARTHROSCOPY Left 04/03/2020   Procedure: LEFT KNEE ARTHROSCOPY  WASHOUT;  Surgeon: Tania Ade, MD;  [facility];  Service: Orthopedics;  Laterality: Left;   LEFT HEART CATH AND CORONARY ANGIOGRAPHY N/A 08/30/2017   Procedure: LEFT HEART CATH AND CORONARY ANGIOGRAPHY;  Surgeon: Adrian Prows, MD;  Location: [place] [facility];  Service: Cardiovascular;  Laterality: N/A;   LEFT HEART CATH AND CORS/GRAFTS ANGIOGRAPHY N/A 08/05/2017   Procedure: LEFT HEART CATH AND CORS/GRAFTS ANGIOGRAPHY;  Surgeon: Adrian Prows, MD;  Location: [place] [facility];  Service: Cardiovascular;  Laterality: N/A;     Family History  Problem Relation Age of Onset   Lung cancer Mother    Stroke Father    Febrile seizures Brother      Social History   Socioeconomic History   Marital status: Widowed    Spouse name: Not on file   Number of children: 0   Years of education: Not on file   Highest education level: Not on file  Occupational History   Not on file  Tobacco Use   Smoking status: Former    Packs/day: 0.50    Years: 3.00    Pack years: 1.50    Types: Cigarettes    Quit date: 67    Years since quitting: 62.9   Smokeless tobacco: Never  Vaping Use   Vaping Use: Never used  Substance and Sexual Activity   Alcohol use: No   Drug use: No   Sexual activity: Yes  Other Topics Concern   Not on file  Social History Narrative   Not on file   Social Determinants of Health   Financial Resource Strain: Not on file  Food Insecurity: Not on file   Transportation Needs: Not on file  Physical Activity: Not on file  Stress: Not on file  Social Connections: Not on file  Intimate Partner Violence: Not on file     BP 122/70   Pulse 65   Ht 6\' 2"  (1.88 m)   Wt 173 lb 6.4 oz (78.7 kg)   SpO2 99%   BMI 22.26 kg/m   Physical Exam:  Well appearing NAD HEENT: Unremarkable Neck:  No JVD, no thyromegally Lymphatics:  No adenopathy Back:  No CVA tenderness Lungs:  Clear, with no wheezes, rales, or rhonchi HEART:  Regular rate rhythm, no  murmurs, no rubs, no clicks Abd:  soft, positive bowel sounds, no organomegally, no rebound, no guarding Ext:  2 plus pulses, no edema, no cyanosis, no clubbing Skin:  No rashes no nodules Neuro:  CN II through XII intact, motor grossly intact  EKG -probable atrial fibrillation with ventricular pacing  DEVICE  Normal device function.  See PaceArt for details.  End-of-life  Assess/Plan:  1.  Complete heart block -the patient appears to have no escape.  He will undergo pacemaker generator removal and insertion of a new dual-chamber pacemaker. 2.  Paroxysmal atrial fibrillation -he appears to be in atrial fibrillation today.  He has reverted to VVI 65 so we cannot know this for sure until he is undergone evaluation. 3.  Coronary artery disease -he is status post bypass surgery and denies anginal symptoms.  He has felt more poorly recently but I suspect this is related to his pacemaker function.  Crissie Sickles, MD

## 2021-06-16 LAB — BASIC METABOLIC PANEL
BUN/Creatinine Ratio: 14 (ref 10–24)
BUN: 18 mg/dL (ref 8–27)
CO2: 21 mmol/L (ref 20–29)
Calcium: 9.7 mg/dL (ref 8.6–10.2)
Chloride: 106 mmol/L (ref 96–106)
Creatinine, Ser: 1.26 mg/dL (ref 0.76–1.27)
Glucose: 169 mg/dL — ABNORMAL HIGH (ref 70–99)
Potassium: 4.6 mmol/L (ref 3.5–5.2)
Sodium: 143 mmol/L (ref 134–144)
eGFR: 57 mL/min/{1.73_m2} — ABNORMAL LOW (ref >59)

## 2021-06-16 LAB — CBC WITH DIFFERENTIAL/PLATELET
Basophils Absolute: 0 10*3/uL (ref 0.0–0.2)
Basos: 1 %
EOS (ABSOLUTE): 0 10*3/uL (ref 0.0–0.4)
Eos: 0 %
Hematocrit: 40.4 % (ref 37.5–51.0)
Hemoglobin: 13.3 g/dL (ref 13.0–17.7)
Immature Grans (Abs): 0 10*3/uL (ref 0.0–0.1)
Immature Granulocytes: 0 %
Lymphocytes Absolute: 1.7 10*3/uL (ref 0.7–3.1)
Lymphs: 25 %
MCH: 32.7 pg (ref 26.6–33.0)
MCHC: 32.9 g/dL (ref 31.5–35.7)
MCV: 99 fL — ABNORMAL HIGH (ref 79–97)
Monocytes Absolute: 0.6 10*3/uL (ref 0.1–0.9)
Monocytes: 9 %
Neutrophils Absolute: 4.3 10*3/uL (ref 1.4–7.0)
Neutrophils: 65 %
Platelets: 145 10*3/uL — ABNORMAL LOW (ref 150–450)
RBC: 4.07 x10E6/uL — ABNORMAL LOW (ref 4.14–5.80)
RDW: 12.3 % (ref 11.6–15.4)
WBC: 6.7 10*3/uL (ref 3.4–10.8)

## 2021-06-16 LAB — PRO B NATRIURETIC PEPTIDE: NT-Pro BNP: 733 pg/mL — ABNORMAL HIGH (ref 0–486)

## 2021-06-16 NOTE — Pre-Procedure Instructions (Signed)
Instructed patient on the following items: Arrival time 1030 Nothing to eat or drink after midnight No meds AM of procedure Responsible person to drive you home and stay with you for 24 hrs Wash with special soap night before and morning of procedure If on anti-coagulant drug instructions Xarelto

## 2021-06-17 ENCOUNTER — Encounter (HOSPITAL_COMMUNITY)
Admission: RE | Disposition: A | Discharge status: Home / Self Care | Payer: Self-pay | Source: Ambulatory Visit | Attending: Internal Medicine

## 2021-06-17 ENCOUNTER — Ambulatory Visit (HOSPITAL_COMMUNITY)
Admission: RE | Admit: 2021-06-17 | Discharge: 2021-06-17 | Disposition: A | Discharge status: Home / Self Care | Payer: Medicare Other | Source: Ambulatory Visit | Attending: Internal Medicine | Admitting: Internal Medicine

## 2021-06-17 DIAGNOSIS — I480 Paroxysmal atrial fibrillation: Secondary | ICD-10-CM | POA: Diagnosis not present

## 2021-06-17 DIAGNOSIS — I2510 Atherosclerotic heart disease of native coronary artery without angina pectoris: Secondary | ICD-10-CM | POA: Insufficient documentation

## 2021-06-17 DIAGNOSIS — Z45010 Encounter for checking and testing of cardiac pacemaker pulse generator [battery]: Secondary | ICD-10-CM | POA: Diagnosis not present

## 2021-06-17 DIAGNOSIS — I442 Atrioventricular block, complete: Secondary | ICD-10-CM | POA: Insufficient documentation

## 2021-06-17 HISTORY — PX: PPM GENERATOR CHANGEOUT: EP1233

## 2021-06-17 LAB — GLUCOSE, CAPILLARY
Glucose-Capillary: 127 mg/dL — ABNORMAL HIGH (ref 70–99)
Glucose-Capillary: 125 mg/dL — ABNORMAL HIGH (ref 70–99)

## 2021-06-17 SURGERY — PPM GENERATOR CHANGEOUT
Anesthesia: LOCAL

## 2021-06-17 MED ORDER — CEFAZOLIN SODIUM-DEXTROSE 2-4 GM/100ML-% IV SOLN
2.0000 g | INTRAVENOUS | Status: AC
  Administered 2021-06-17: 2 g via INTRAVENOUS

## 2021-06-17 MED ORDER — SODIUM CHLORIDE 0.9 % IV SOLN
INTRAVENOUS | Status: AC
  Filled 2021-06-17: qty 2

## 2021-06-17 MED ORDER — LIDOCAINE HCL 1 % IJ SOLN
INTRAMUSCULAR | Status: AC
  Filled 2021-06-17: qty 60

## 2021-06-17 MED ORDER — LIDOCAINE HCL (PF) 1 % IJ SOLN
INTRAMUSCULAR | Status: DC | PRN
  Administered 2021-06-17: 60 mL

## 2021-06-17 MED ORDER — CEFAZOLIN SODIUM-DEXTROSE 2-4 GM/100ML-% IV SOLN
INTRAVENOUS | Status: AC
  Filled 2021-06-17: qty 100

## 2021-06-17 MED ORDER — ONDANSETRON HCL 4 MG/2ML IJ SOLN: 4.0000 mg | Freq: Four times a day (QID) | INTRAMUSCULAR | Status: DC | PRN

## 2021-06-17 MED ORDER — SODIUM CHLORIDE 0.9 % IV SOLN
80.0000 mg | INTRAVENOUS | Status: AC
  Administered 2021-06-17: 80 mg

## 2021-06-17 MED ORDER — ACETAMINOPHEN 325 MG PO TABS: 325.0000 mg | ORAL_TABLET | ORAL | Status: DC | PRN

## 2021-06-17 MED ORDER — POVIDONE-IODINE 10 % EX SWAB
2.0000 "application " | Freq: Once | CUTANEOUS | Status: AC
  Administered 2021-06-17: 2 via TOPICAL

## 2021-06-17 MED ORDER — CHLORHEXIDINE GLUCONATE 4 % EX LIQD
4.0000 "application " | Freq: Once | CUTANEOUS | Status: DC
  Filled 2021-06-17: qty 60

## 2021-06-17 MED ORDER — SODIUM CHLORIDE 0.9 % IV SOLN: INTRAVENOUS | Status: DC

## 2021-06-17 SURGICAL SUPPLY — 5 items
CABLE SURGICAL S-101-97-12 (CABLE) ×2 IMPLANT
IPG PACE AZUR XT DR MRI W1DR01 (Pacemaker) ×1 IMPLANT
PACE AZURE XT DR MRI W1DR01 (Pacemaker) ×2 IMPLANT
PAD DEFIB RADIO PHYSIO CONN (PAD) ×2 IMPLANT
TRAY PACEMAKER INSERTION (PACKS) ×2 IMPLANT

## 2021-06-17 NOTE — Interval H&P Note (Signed)
History and Physical Interval Note:  06/17/2021 2:12 PM  Jesse [name]  has presented today for surgery, with the diagnosis of pacemaker ERI.  The various methods of treatment have been discussed with the patient and family. After consideration of risks, benefits and other options for treatment, the patient has consented to  Procedure(s): PPM GENERATOR CHANGEOUT (N/A) as a surgical intervention.  The patient's history has been reviewed, patient examined, no change in status, stable for surgery.  I have reviewed the patient's chart and labs.  Questions were answered to the patient's satisfaction.     Cristopher Peru

## 2021-06-17 NOTE — Progress Notes (Signed)
Renee, PA paged regarding when patient needs to restart Xarelto. PA stated to resume medication on Monday. Information placed on AVS and patient informed.

## 2021-06-19 ENCOUNTER — Encounter (HOSPITAL_COMMUNITY): Payer: Self-pay | Admitting: Internal Medicine

## 2021-06-23 ENCOUNTER — Ambulatory Visit: Payer: Medicare Other

## 2021-06-23 ENCOUNTER — Other Ambulatory Visit: Payer: Self-pay

## 2021-06-23 DIAGNOSIS — I25118 Atherosclerotic heart disease of native coronary artery with other forms of angina pectoris: Secondary | ICD-10-CM | POA: Diagnosis not present

## 2021-06-23 DIAGNOSIS — R0602 Shortness of breath: Secondary | ICD-10-CM

## 2021-06-24 ENCOUNTER — Ambulatory Visit: Payer: Medicare Other | Admitting: Cardiology

## 2021-06-30 ENCOUNTER — Ambulatory Visit (HOSPITAL_COMMUNITY)
Admission: RE | Admit: 2021-06-30 | Discharge: 2021-06-30 | Disposition: A | Discharge status: Home / Self Care | Payer: Medicare Other | Attending: Cardiology | Admitting: Cardiology

## 2021-06-30 ENCOUNTER — Ambulatory Visit (HOSPITAL_COMMUNITY)
Admission: RE | Disposition: A | Discharge status: Home / Self Care | Payer: Self-pay | Source: Home / Self Care | Attending: Cardiology

## 2021-06-30 ENCOUNTER — Encounter (HOSPITAL_COMMUNITY): Payer: Self-pay | Admitting: Cardiology

## 2021-06-30 DIAGNOSIS — I2582 Chronic total occlusion of coronary artery: Secondary | ICD-10-CM | POA: Diagnosis not present

## 2021-06-30 DIAGNOSIS — I2510 Atherosclerotic heart disease of native coronary artery without angina pectoris: Secondary | ICD-10-CM | POA: Diagnosis present

## 2021-06-30 DIAGNOSIS — R0602 Shortness of breath: Secondary | ICD-10-CM | POA: Insufficient documentation

## 2021-06-30 DIAGNOSIS — E785 Hyperlipidemia, unspecified: Secondary | ICD-10-CM | POA: Insufficient documentation

## 2021-06-30 DIAGNOSIS — I25700 Atherosclerosis of coronary artery bypass graft(s), unspecified, with unstable angina pectoris: Secondary | ICD-10-CM | POA: Insufficient documentation

## 2021-06-30 DIAGNOSIS — E119 Type 2 diabetes mellitus without complications: Secondary | ICD-10-CM | POA: Diagnosis not present

## 2021-06-30 DIAGNOSIS — Z950 Presence of cardiac pacemaker: Secondary | ICD-10-CM | POA: Insufficient documentation

## 2021-06-30 DIAGNOSIS — I495 Sick sinus syndrome: Secondary | ICD-10-CM | POA: Diagnosis not present

## 2021-06-30 DIAGNOSIS — I10 Essential (primary) hypertension: Secondary | ICD-10-CM | POA: Insufficient documentation

## 2021-06-30 DIAGNOSIS — I25110 Atherosclerotic heart disease of native coronary artery with unstable angina pectoris: Secondary | ICD-10-CM | POA: Diagnosis not present

## 2021-06-30 HISTORY — PX: LEFT HEART CATH AND CORS/GRAFTS ANGIOGRAPHY: CATH118250

## 2021-06-30 LAB — GLUCOSE, CAPILLARY
Glucose-Capillary: 107 mg/dL — ABNORMAL HIGH (ref 70–99)
Glucose-Capillary: 97 mg/dL (ref 70–99)

## 2021-06-30 SURGERY — LEFT HEART CATH AND CORS/GRAFTS ANGIOGRAPHY
Anesthesia: LOCAL

## 2021-06-30 MED ORDER — SODIUM CHLORIDE 0.9% FLUSH: 3.0000 mL | INTRAVENOUS | Status: DC | PRN

## 2021-06-30 MED ORDER — SODIUM CHLORIDE 0.9% FLUSH: 3.0000 mL | Freq: Two times a day (BID) | INTRAVENOUS | Status: DC

## 2021-06-30 MED ORDER — ONDANSETRON HCL 4 MG/2ML IJ SOLN: 4.0000 mg | Freq: Four times a day (QID) | INTRAMUSCULAR | Status: DC | PRN

## 2021-06-30 MED ORDER — VERAPAMIL HCL 2.5 MG/ML IV SOLN
INTRAVENOUS | Status: DC | PRN
  Administered 2021-06-30: 10 mL via INTRA_ARTERIAL

## 2021-06-30 MED ORDER — MIDAZOLAM HCL 2 MG/2ML IJ SOLN
INTRAMUSCULAR | Status: DC | PRN
  Administered 2021-06-30: 2 mg via INTRAVENOUS

## 2021-06-30 MED ORDER — HEPARIN (PORCINE) IN NACL 1000-0.9 UT/500ML-% IV SOLN
INTRAVENOUS | Status: DC | PRN
  Administered 2021-06-30 (×2): 500 mL

## 2021-06-30 MED ORDER — HEPARIN SODIUM (PORCINE) 1000 UNIT/ML IJ SOLN
INTRAMUSCULAR | Status: AC
  Filled 2021-06-30: qty 10

## 2021-06-30 MED ORDER — ACETAMINOPHEN 325 MG PO TABS: 650.0000 mg | ORAL_TABLET | ORAL | Status: DC | PRN

## 2021-06-30 MED ORDER — SODIUM CHLORIDE 0.9 % WEIGHT BASED INFUSION: 1.0000 mL/kg/h | INTRAVENOUS | Status: DC

## 2021-06-30 MED ORDER — METFORMIN HCL ER 500 MG PO TB24: 500.0000 mg | ORAL_TABLET | Freq: Every day | ORAL | 1 refills | Status: DC

## 2021-06-30 MED ORDER — VERAPAMIL HCL 2.5 MG/ML IV SOLN
INTRAVENOUS | Status: AC
  Filled 2021-06-30: qty 2

## 2021-06-30 MED ORDER — SODIUM CHLORIDE 0.9 % WEIGHT BASED INFUSION
3.0000 mL/kg/h | INTRAVENOUS | Status: DC
  Administered 2021-06-30: 3 mL/kg/h via INTRAVENOUS

## 2021-06-30 MED ORDER — MIDAZOLAM HCL 2 MG/2ML IJ SOLN
INTRAMUSCULAR | Status: AC
  Filled 2021-06-30: qty 2

## 2021-06-30 MED ORDER — LIDOCAINE HCL (PF) 1 % IJ SOLN
INTRAMUSCULAR | Status: AC
  Filled 2021-06-30: qty 30

## 2021-06-30 MED ORDER — IOHEXOL 350 MG/ML SOLN
INTRAVENOUS | Status: DC | PRN
  Administered 2021-06-30: 70 mL

## 2021-06-30 MED ORDER — HEPARIN SODIUM (PORCINE) 1000 UNIT/ML IJ SOLN
INTRAMUSCULAR | Status: DC | PRN
  Administered 2021-06-30: 5000 [IU] via INTRAVENOUS

## 2021-06-30 MED ORDER — SODIUM CHLORIDE 0.9 % IV SOLN: 250.0000 mL | INTRAVENOUS | Status: DC | PRN

## 2021-06-30 MED ORDER — LIDOCAINE HCL (PF) 1 % IJ SOLN
INTRAMUSCULAR | Status: DC | PRN
  Administered 2021-06-30: 2 mL

## 2021-06-30 MED ORDER — FENTANYL CITRATE (PF) 100 MCG/2ML IJ SOLN
INTRAMUSCULAR | Status: DC | PRN
  Administered 2021-06-30: 25 ug via INTRAVENOUS

## 2021-06-30 MED ORDER — ASPIRIN 81 MG PO CHEW
81.0000 mg | CHEWABLE_TABLET | ORAL | Status: AC
  Administered 2021-06-30: 81 mg via ORAL
  Filled 2021-06-30: qty 1

## 2021-06-30 SURGICAL SUPPLY — 9 items
CATH OPTITORQUE TIG 4.0 5F (CATHETERS) ×2 IMPLANT
DEVICE RAD COMP TR BAND LRG (VASCULAR PRODUCTS) ×2 IMPLANT
GLIDESHEATH SLEND SS 6F .021 (SHEATH) ×2 IMPLANT
GUIDEWIRE INQWIRE 1.5J.035X260 (WIRE) ×1 IMPLANT
INQWIRE 1.5J .035X260CM (WIRE) ×2
KIT HEART LEFT (KITS) ×2 IMPLANT
PACK CARDIAC CATHETERIZATION (CUSTOM PROCEDURE TRAY) ×2 IMPLANT
TRANSDUCER W/STOPCOCK (MISCELLANEOUS) ×2 IMPLANT
TUBING CIL FLEX 10 FLL-RA (TUBING) ×2 IMPLANT

## 2021-06-30 NOTE — Interval H&P Note (Signed)
History and Physical Interval Note:  06/30/2021 8:24 AM  Jesse [name]  has presented today for surgery, with the diagnosis of shortness of breath - cad.  The various methods of treatment have been discussed with the patient and family. After consideration of risks, benefits and other options for treatment, the patient has consented to  Procedure(s): LEFT HEART CATH AND CORONARY ANGIOGRAPHY (N/A) and possible angioplasty as a surgical intervention.  The patient's history has been reviewed, patient examined, no change in status, stable for surgery.  I have reviewed the patient's chart and labs.  Questions were answered to the patient's satisfaction.   Cath Lab Visit (complete for each Cath Lab visit)  Clinical Evaluation Leading to the Procedure:   ACS: No.  Non-ACS:    Anginal Classification: CCS III  Anti-ischemic medical therapy: Maximal Therapy (2 or more classes of medications)  Non-Invasive Test Results: High-risk stress test findings: cardiac mortality >3%/year  Prior CABG: Previous CABG     Adrian Prows

## 2021-06-30 NOTE — Discharge Instructions (Signed)
Radial Site Care  This sheet gives you information about how to care for yourself after your procedure. Your health care provider may also give you more specific instructions. If you have problems or questions, contact your health care provider. What can I expect after the procedure? After the procedure, it is common to have: Bruising and tenderness at the catheter insertion area. Follow these instructions at home: Medicines Take over-the-counter and prescription medicines only as told by your health care provider. Insertion site care Follow instructions from your health care provider about how to take care of your insertion site. Make sure you: Wash your hands with soap and water before you remove your bandage (dressing). If soap and water are not available, use hand sanitizer. May remove dressing in 24 hours. Check your insertion site every day for signs of infection. Check for: Redness, swelling, or pain. Fluid or blood. Pus or a bad smell. Warmth. Do no take baths, swim, or use a hot tub for 5 days. You may shower 24-48 hours after the procedure. Remove the dressing and gently wash the site with plain soap and water. Pat the area dry with a clean towel. Do not rub the site. That could cause bleeding. Do not apply powder or lotion to the site. Activity  For 24 hours after the procedure, or as directed by your health care provider: Do not flex or bend the affected arm. Do not push or pull heavy objects with the affected arm. Do not drive yourself home from the hospital or clinic. You may drive 24 hours after the procedure. Do not operate machinery or power tools. KEEP ARM ELEVATED THE REMAINDER OF THE DAY. Do not push, pull or lift anything that is heavier than 10 lb for 5 days. Ask your health care provider when it is okay to: Return to work or school. Resume usual physical activities or sports. Resume sexual activity. General instructions If the catheter site starts to  bleed, raise your arm and put firm pressure on the site. If the bleeding does not stop, get help right away. This is a medical emergency. DRINK PLENTY OF FLUIDS FOR THE NEXT 2-3 DAYS. No alcohol consumption for 24 hours after receiving sedation. If you went home on the same day as your procedure, a responsible adult should be with you for the first 24 hours after you arrive home. Keep all follow-up visits as told by your health care provider. This is important. Contact a health care provider if: You have a fever. You have redness, swelling, or yellow drainage around your insertion site. Get help right away if: You have unusual pain at the radial site. The catheter insertion area swells very fast. The insertion area is bleeding, and the bleeding does not stop when you hold steady pressure on the area. Your arm or hand becomes pale, cool, tingly, or numb. These symptoms may represent a serious problem that is an emergency. Do not wait to see if the symptoms will go away. Get medical help right away. Call your local emergency services (911 in the [place] Do not drive yourself to the hospital. Summary After the procedure, it is common to have bruising and tenderness at the site. Follow instructions from your health care provider about how to take care of your radial site wound. Check the wound every day for signs of infection.  This information is not intended to replace advice given to you by your health care provider. Make sure you discuss any questions you have with   your health care provider. Document Revised: 08/17/2017 Document Reviewed: 08/17/2017 Elsevier Patient Education  2020 Else[employer] Inc[employer]  

## 2021-07-01 ENCOUNTER — Ambulatory Visit: Payer: Medicare Other

## 2021-07-02 MED FILL — Fentanyl Citrate Preservative Free (PF) Inj 100 MCG/2ML: INTRAMUSCULAR | Qty: 2 | Status: AC

## 2021-07-02 MED FILL — Aspirin Chew Tab 81 MG: ORAL | Qty: 1 | Status: AC

## 2021-07-02 MED FILL — Heparin Sod (Porcine)-NaCl IV Soln 1000 Unit/500ML-0.9%: INTRAVENOUS | Qty: 500 | Status: AC

## 2021-07-02 MED FILL — Heparin Sodium (Porcine) Inj 1000 Unit/ML: INTRAMUSCULAR | Qty: 1 | Status: AC

## 2021-07-02 MED FILL — Midazolam HCl Inj PF 2 MG/2ML (Base Equivalent): INTRAMUSCULAR | Qty: 2 | Status: AC

## 2021-07-05 NOTE — Progress Notes (Signed)
Cardiology Office Note Date:  07/06/2021  Patient ID:  Jesse [name], Jesse [name] 1940/08/14, MRN [id number] PCP:  Deland Pretty, MD  Cardiologist:  Dr. Einar Gip Electrophysiologist: Dr. Lovena Le    Chief Complaint:  wound check, gen change  History of Present Illness: Jesse [name] is a 80 y.o. male with history of CHB w/PPM, CAD (CABG 2007, PCI LCx 2019), HTN, HLD, DM, AFib  She saw Dr. Lovena Le 06/15/21 for consult with PPM at [facility], underwent gen change 06/17/21.  Follows otherwise with Dr. Einar Gip, had cath 06/30/21 with stable CAD  TODAY He feels well, much better since gen change No CP, breathing is markedly improved No palpitations No near syncope or syncope. No bleeding or signs of bleeding   Device information MDT dual chamber PPM implanted 11/20/2009, gen change 06/17/21   Past Medical History:  Diagnosis Date   Angina pectoris ([place]) 02/03/2015   CAD (coronary artery disease), native coronary artery 08/30/2018   2007 [place], [place]: LIMA to LAD, Occluded SVG to RCA and OM by angiogram S/P  PCI/orbital atherectomy S/P 2.5x15 Resoluute to prox Cx on 11/29/2017.   Coronary artery disease    High cholesterol    Hypertension    Myocardial infarction [facility]) 2017/2018?   NSTEMI (non-ST elevated myocardial infarction) ([place]) 02/03/2015   Presence of permanent cardiac pacemaker    Type II diabetes mellitus ([place])     Past Surgical History:  Procedure Laterality Date   CARDIAC CATHETERIZATION N/A 02/04/2015   Procedure: Left Heart Cath and Cors/Grafts Angiography;  Surgeon: Adrian Prows, MD;  Location: [place] [facility];  Service: Cardiovascular;  Laterality: N/A;   CARDIAC CATHETERIZATION N/A 02/04/2015   Procedure: Coronary Stent Intervention;  Surgeon: Adrian Prows, MD;  Location: [place] [facility];  Service: Cardiovascular;  Laterality: N/A;   CORONARY ANGIOGRAPHY N/A 10/18/2017   Procedure: CORONARY ANGIOGRAPHY;  Surgeon: Adrian Prows, MD;  Location: [place] [facility];  Service: Cardiovascular;   Laterality: N/A;   CORONARY ANGIOPLASTY     CORONARY ANGIOPLASTY WITH STENT PLACEMENT     "i've got 5-6 stents total" (08/30/2017)   CORONARY ARTERY BYPASS GRAFT  2007   CABG X3   CORONARY ATHERECTOMY N/A 10/18/2017   Procedure: CORONARY ATHERECTOMY;  Surgeon: Adrian Prows, MD;  Location: [place] [facility];  Service: Cardiovascular;  Laterality: N/A;   CORONARY ATHERECTOMY N/A 11/29/2017   Procedure: CORONARY ATHERECTOMY;  Surgeon: Adrian Prows, MD;  Location: [place] [facility];  Service: Cardiovascular;  Laterality: N/A;   CORONARY CTO INTERVENTION N/A 08/30/2017   Procedure: CORONARY CTO INTERVENTION;  Surgeon: Adrian Prows, MD;  Location: [place] [facility];  Service: Cardiovascular;  Laterality: N/A;   CORONARY STENT INTERVENTION N/A 11/29/2017   Procedure: CORONARY STENT INTERVENTION;  Surgeon: Adrian Prows, MD;  Location: [place] [facility];  Service: Cardiovascular;  Laterality: N/A;   INSERT / REPLACE / REMOVE PACEMAKER  ?2012   "I think it was placed in 2012"   KNEE ARTHROSCOPY Left 04/03/2020   Procedure: LEFT KNEE ARTHROSCOPY  WASHOUT;  Surgeon: Tania Ade, MD;  Location: [facility];  Service: Orthopedics;  Laterality: Left;   LEFT HEART CATH AND CORONARY ANGIOGRAPHY N/A 08/30/2017   Procedure: LEFT HEART CATH AND CORONARY ANGIOGRAPHY;  Surgeon: Adrian Prows, MD;  Location: [place] [facility];  Service: Cardiovascular;  Laterality: N/A;   LEFT HEART CATH AND CORS/GRAFTS ANGIOGRAPHY N/A 08/05/2017   Procedure: LEFT HEART CATH AND CORS/GRAFTS ANGIOGRAPHY;  Surgeon: Adrian Prows, MD;  Location: [place] [facility];  Service: Cardiovascular;  Laterality: N/A;   LEFT HEART CATH AND CORS/GRAFTS ANGIOGRAPHY N/A 06/30/2021   Procedure: LEFT HEART CATH AND CORS/GRAFTS ANGIOGRAPHY;  Surgeon: Adrian Prows, MD;  Location: [place] [facility];  Service: Cardiovascular;  Laterality: N/A;   PPM GENERATOR CHANGEOUT N/A 06/17/2021   Procedure: PPM GENERATOR CHANGEOUT;  Surgeon: Evans Lance, MD;  Location: [place] [facility];  Service: Cardiovascular;  Laterality: N/A;    Current Outpatient Medications  Medication Sig Dispense Refill   Biotin 5000 MCG TABS Take 5,000 mcg by mouth daily.     dorzolamide-timolol (COSOPT) 22.3-6.8 MG/ML ophthalmic solution Place 1 drop into both eyes 2 (two) times daily.  10   ezetimibe (ZETIA) 10 MG tablet TAKE 1 TABLET BY MOUTH EVERY DAY AFTER SUPPER 30 tablet 2   hydrocortisone cream 1 % Apply 1 application topically 2 (two) times daily as needed for itching.     isosorbide mononitrate (IMDUR) 30 MG 24 hr tablet Take 1 tablet (30 mg total) by mouth daily. 30 tablet 3   latanoprost (XALATAN) 0.005 % ophthalmic solution Place 1 drop into both eyes at bedtime.  12   losartan (COZAAR) 25 MG tablet TAKE 1 TABLET BY MOUTH DAILY 90 tablet 1   metFORMIN (GLUCOPHAGE) 500 MG tablet Take 500 mg by mouth 2 (two) times daily with a meal.     metoprolol tartrate (LOPRESSOR) 25 MG tablet TAKE 1 TABLET BY MOUTH TWICE DAILY 180 tablet 3   nitroGLYCERIN (NITROSTAT) 0.4 MG SL tablet Place 1 tablet (0.4 mg total) under the tongue every 5 (five) minutes as needed for chest pain. 25 tablet 3   rosuvastatin (CRESTOR) 20 MG tablet Take 20 mg by mouth at bedtime.     TRUE METRIX BLOOD GLUCOSE TEST test strip 2 (two) times daily.     vitamin B-12 (CYANOCOBALAMIN) 1000 MCG tablet Take 1,000 mcg by mouth daily.     XARELTO 20 MG TABS tablet TAKE 1 TABLET(20 MG) BY MOUTH DAILY WITH SUPPER (Patient taking differently: Take 20 mg by mouth daily with supper.) 30 tablet 3   No current facility-administered medications for this visit.    Allergies:   Brilinta [ticagrelor]   Social History:  The patient  reports that he quit smoking about 62 years ago. His smoking use included cigarettes. He has a 1.50 pack-year smoking history. He has never used smokeless tobacco. He reports that he does not drink alcohol and does not use drugs.   Family History:  The patient's family history includes Febrile seizures in  his brother; Lung cancer in his mother; Stroke in his father.  ROS:  Please see the history of present illness.    All other systems are reviewed and otherwise negative.   PHYSICAL EXAM:  VS:  BP 122/60   Pulse 72   Ht 6\' 2"  (1.88 m)   Wt 174 lb 6.4 oz (79.1 kg)   SpO2 99%   BMI 22.39 kg/m  BMI: Body mass index is 22.39 kg/m. Well nourished, well developed, in no acute distress HEENT: normocephalic, atraumatic Neck: no JVD, carotid bruits or masses Cardiac:  RRR; no significant murmurs, no rubs, or gallops Lungs:  CTA b/l, no wheezing, rhonchi or rales Abd: soft, nontender MS: no deforities, age appropriate atrophy Ext:  no edema Skin: warm and dry, no rash Neuro:  RUE weakness (from poli as a child) no  other gross motor deficits appreciated Psych: euthymic mood, full affect   PPM site is stable, well healed,  no erythema, edema, or fluctuation, no heat, no signs of infection, no discomfort   EKG:  not done today  Device interrogation done today and reviewed by myself:  Battery and lead measurements are good No arrhythmias No programming changes made Device dependent   Coronary angiography 06/30/2021: RCA: Long CTO.  He has ipsilateral collaterals as contralateral collaterals from the LAD. LM: Large vessel.  Mildly calcified. LAD: Ostial 40 to 50% stenosis, diffuse.  Continues as a large D1 as the LAD is occluded in the proximal segment.   LIMA to LAD is widely RI: Moderate caliber vessel, ostial 40 to 50% stenosis, calcific. CX: 2.5 x 15 mm resolute Onyx DES placed 11/29/2017 in the ostium of the CX is widely patent.  CX gives origin to large OM1 and OM 2.  There is mild diffuse disease. LV: Mild inferior hypokinesis, preserved LVEF at 50 to 55%.  No significant MR. Hemodynamics: LV 93/0, EDP 7 mmHg.  Ao 89/41, mean 60 mmHg.  There was no pressure gradient across the aortic valve.  Recommendation: Coronary anatomy is not significantly changed from 2019.  He has small  vessel disease, continue medical therapy as indicated.  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  70 mL contrast utilized  Coronary Angiogram 11/29/2017:   (CABG 2007 [place], [place]): LIMA to LAD, Occluded SVG to RCA and OM by angiogram S/P  PCI/orbital atherectomy S/P 2.5x15 Resoluute to prox Cx on 11/29/2017.   Lexiscan/modified Bruce Tetrofosmin stress test 02/06/2020: Lexiscan/modified Bruce nuclear stress test performed using 1-day protocol. Stress EKG is non-diagnostic, as this is pharmacological stress test. In addition, stress EKG at 110% MPHR showed AV paced rhythm.  SPECT images show medium sized, mild intensity, partially reversible perfusion defect in apical to basal inferior and basal inferoseptal myocardium. While septal perfusion defect may be due to paced rhythm, ischemia cannot be excluded. Recommend clinical correlation.  Stress LVEF 44%. High risk study. No significant change from 02/14/2017.    Echocardiogram 02/06/2020:  Normal LV systolic function with visual EF 55-60%. Left ventricle cavity  is normal in size. Mild left ventricular hypertrophy. Normal global wall  motion. Indeterminate diastolic filling pattern, indeterminate LAP. Calculated EF 55%.  Left atrial cavity is normal in size. A lipomatous septum is present.  Mild to moderate aortic regurgitation.  Moderate tricuspid regurgitation.  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   Recent Labs: 06/15/2021: BUN 18; Creatinine, Ser 1.26; Hemoglobin 13.3; NT-Pro BNP 733; Platelets 145; Potassium 4.6; Sodium 143  No results found for requested labs within last 8760 hours.   Estimated Creatinine Clearance: 51.4 mL/min (by C-G formula based on SCr of 1.26 mg/dL).   Wt Readings from Last 3 Encounters:  07/06/21 174 lb 6.4 oz (79.1 kg)  06/30/21 170 lb (77.1 kg)   06/17/21 173 lb (78.5 kg)     Other studies reviewed: Additional studies/records reviewed today include: summarized above  ASSESSMENT AND PLAN:  PPM S/p gen change, well healed Follows with Dr. Einar Gip  Paroxysmal Afib CHA2DS2Vasc is 5, on xarelto   CAD No anginal symptoms Recent cath as abive  HTN Looks good  Disposition: F/u with Dr. Einar Gip as directed by him, here PRN  Current medicines are reviewed at length with the patient today.  The patient did not have any concerns regarding medicines.  Signed, Tommye Standard, PA-C 07/06/2021  9:47 AM     [facility] [address] [place] [address] [phone] (office)  [phone] (fax)

## 2021-07-06 ENCOUNTER — Encounter: Payer: Self-pay | Admitting: Physician Assistant

## 2021-07-06 ENCOUNTER — Ambulatory Visit (INDEPENDENT_AMBULATORY_CARE_PROVIDER_SITE_OTHER): Payer: Medicare Other | Admitting: Physician Assistant

## 2021-07-06 ENCOUNTER — Other Ambulatory Visit: Payer: Self-pay

## 2021-07-06 VITALS — BP 122/60 | HR 72 | Ht 74.0 in | Wt 174.4 lb

## 2021-07-06 DIAGNOSIS — I209 Angina pectoris, unspecified: Secondary | ICD-10-CM | POA: Diagnosis not present

## 2021-07-06 DIAGNOSIS — Z5189 Encounter for other specified aftercare: Secondary | ICD-10-CM | POA: Diagnosis not present

## 2021-07-06 DIAGNOSIS — I495 Sick sinus syndrome: Secondary | ICD-10-CM

## 2021-07-06 DIAGNOSIS — I10 Essential (primary) hypertension: Secondary | ICD-10-CM | POA: Diagnosis not present

## 2021-07-06 DIAGNOSIS — Z950 Presence of cardiac pacemaker: Secondary | ICD-10-CM | POA: Diagnosis not present

## 2021-07-06 DIAGNOSIS — I2510 Atherosclerotic heart disease of native coronary artery without angina pectoris: Secondary | ICD-10-CM | POA: Diagnosis not present

## 2021-07-06 DIAGNOSIS — I480 Paroxysmal atrial fibrillation: Secondary | ICD-10-CM | POA: Diagnosis not present

## 2021-07-06 LAB — CUP PACEART INCLINIC DEVICE CHECK
Battery Remaining Longevity: 151 mo
Battery Voltage: 3.22 V
Brady Statistic AP VP Percent: 25.98 %
Brady Statistic AP VS Percent: 0 %
Brady Statistic AS VP Percent: 73.69 %
Brady Statistic AS VS Percent: 0.33 %
Brady Statistic RA Percent Paced: 26.12 %
Brady Statistic RV Percent Paced: 99.67 %
Date Time Interrogation Session: 20221212110033
Implantable Lead Implant Date: 20110428
Implantable Lead Implant Date: 20110428
Implantable Lead Location: 753859
Implantable Lead Location: 753860
Implantable Lead Model: 5076
Implantable Lead Model: 5076
Implantable Pulse Generator Implant Date: 20221123
Lead Channel Impedance Value: 380 Ohm
Lead Channel Impedance Value: 418 Ohm
Lead Channel Impedance Value: 456 Ohm
Lead Channel Impedance Value: 513 Ohm
Lead Channel Pacing Threshold Amplitude: 0.5 V
Lead Channel Pacing Threshold Amplitude: 0.625 V
Lead Channel Pacing Threshold Pulse Width: 0.4 ms
Lead Channel Pacing Threshold Pulse Width: 0.4 ms
Lead Channel Sensing Intrinsic Amplitude: 3.25 mV
Lead Channel Sensing Intrinsic Amplitude: 4.375 mV
Lead Channel Setting Pacing Amplitude: 1.5 V
Lead Channel Setting Pacing Amplitude: 2 V
Lead Channel Setting Pacing Pulse Width: 0.4 ms
Lead Channel Setting Sensing Sensitivity: 5.6 mV

## 2021-07-06 NOTE — Patient Instructions (Addendum)
Medication Instructions:   Your physician recommends that you continue on your current medications as directed. Please refer to the Current Medication list given to you today.  *If you need a refill on your cardiac medications before your next appointment, please call your pharmacy*   Lab Work:  [place]   If you have labs (blood work) drawn today and your tests are completely normal, you will receive your results only by: [place] (if you have MyChart) OR A paper copy in the mail If you have any lab test that is abnormal or we need to change your treatment, we will call you to review the results.   Testing/Procedures:  NONE ORDERED  TODAY     Follow-Up: At [facility], you and your health needs are our priority.  As part of our continuing mission to provide you with exceptional heart care, we have created designated Provider Care Teams.  These Care Teams include your primary Cardiologist (physician) and Advanced Practice Providers (APPs -  Physician Assistants and Nurse Practitioners) who all work together to provide you with the care you need, when you need it.  We recommend signing up for the patient portal called "MyChart".  Sign up information is provided on this After Visit Summary.  MyChart is used to connect with patients for Virtual Visits (Telemedicine).  Patients are able to view lab/test results, encounter notes, upcoming appointments, etc.  Non-urgent messages can be sent to your provider as well.   To learn more about what you can do with MyChart, go to [email or web address].    Your next appointment:   CONTACT [facility] [phone] AS NEEDED FOR  ANY CARDIAC RELATED SYMPTOMS  The format for your next appointment:   In Person  Provider:   Cristopher Peru, MD{     Other Instructions

## 2021-07-10 ENCOUNTER — Ambulatory Visit: Payer: Medicare Other | Admitting: Student

## 2021-07-10 ENCOUNTER — Other Ambulatory Visit: Payer: Self-pay

## 2021-07-10 ENCOUNTER — Encounter: Payer: Self-pay | Admitting: Student

## 2021-07-10 VITALS — BP 116/61 | HR 63 | Temp 97.8°F | Ht 74.0 in | Wt 174.0 lb

## 2021-07-10 DIAGNOSIS — Z950 Presence of cardiac pacemaker: Secondary | ICD-10-CM | POA: Diagnosis not present

## 2021-07-10 DIAGNOSIS — R0609 Other forms of dyspnea: Secondary | ICD-10-CM | POA: Diagnosis not present

## 2021-07-10 DIAGNOSIS — I2510 Atherosclerotic heart disease of native coronary artery without angina pectoris: Secondary | ICD-10-CM

## 2021-07-10 DIAGNOSIS — I10 Essential (primary) hypertension: Secondary | ICD-10-CM

## 2021-07-10 NOTE — [facility] Notes (Signed)
Primary Physician/Referring:  Deland Pretty, MD  Patient ID: Jesse [name], male    DOB: [birth year], 80 y.o.   MRN: [id number]  Chief Complaint  Patient presents with   Chest Pain   Follow-up   Hospitalization Follow-up   HPI:    Jesse [name]  is a 80 y.o. male  with coronary artery disease and CABG in 2007 in [place] with LIMA to LAD, SVG to OM which is occluded by angiography on 02/04/2015, occluded SVG to RCA. He underwent complex successful arthrectomy and PCI to Cx on 11/29/2017.  His past medical history also includes sick sinus syndrome status post Medtronic pacemaker implantation 2011, PAF, diabetes mellitus, hypertension and hyperlipidemia.  Patient presented 06/08/2021 for urgent visit with complaints of shortness of breath and chest pain.  Given history of stress test concerning for inferior ischemia shared decision was to proceed with cardiac catheterization.  However following that visit he was seen by Dr. Crissie Sickles and underwent pacemaker generator change out on 06/17/2021.  Following generator change out patient continued to have dyspnea on exertion and chest pain, although these have improved with the addition of Imdur at last office visit.  Again shared decision was to continue proceed with cardiac catheterization.  Left heart cath 06/30/2021 showed coronary anatomy has not significantly changed from 2019, patient does have small vessel disease, medical therapy recommended.  Repeat echocardiogram 06/23/2021 showed improvement of diastolic dysfunction to normal and moderate TR is now mild.  Patient presents today for follow-up after cardiac catheterization.  He has had no recurrence of chest pain, however does report continued mild dyspnea on exertion.  He states this is significantly improved compared to last office visit.  He admits that he is relatively sedentary, averaging 2500 steps per day.  Past Medical History:  Diagnosis Date   Angina pectoris ([place]) 02/03/2015   CAD  (coronary artery disease), native coronary artery 08/30/2018   2007 [place], [place]: LIMA to LAD, Occluded SVG to RCA and OM by angiogram S/P  PCI/orbital atherectomy S/P 2.5x15 Resoluute to prox Cx on 11/29/2017.   Coronary artery disease    High cholesterol    Hypertension    Myocardial infarction [facility]) 2017/2018?   NSTEMI (non-ST elevated myocardial infarction) ([place]) 02/03/2015   Presence of permanent cardiac pacemaker    Type II diabetes mellitus ([place])    Past Surgical History:  Procedure Laterality Date   CARDIAC CATHETERIZATION N/A 02/04/2015   Procedure: Left Heart Cath and Cors/Grafts Angiography;  Surgeon: Adrian Prows, MD;  Location: [facility];  Service: Cardiovascular;  Laterality: N/A;   CARDIAC CATHETERIZATION N/A 02/04/2015   Procedure: Coronary Stent Intervention;  Surgeon: Adrian Prows, MD;  Location: [facility];  Service: Cardiovascular;  Laterality: N/A;   CORONARY ANGIOGRAPHY N/A 10/18/2017   Procedure: CORONARY ANGIOGRAPHY;  Surgeon: Adrian Prows, MD;  Location: [facility];  Service: Cardiovascular;  Laterality: N/A;   CORONARY ANGIOPLASTY     CORONARY ANGIOPLASTY WITH STENT PLACEMENT     "i've got 5-6 stents total" (08/30/2017)   CORONARY ARTERY BYPASS GRAFT  2007   CABG X3   CORONARY ATHERECTOMY N/A 10/18/2017   Procedure: CORONARY ATHERECTOMY;  Surgeon: Adrian Prows, MD;  Location: [facility];  Service: Cardiovascular;  Laterality: N/A;   CORONARY ATHERECTOMY N/A 11/29/2017   Procedure: CORONARY ATHERECTOMY;  Surgeon: Adrian Prows, MD;  Location: [facility];  Service: Cardiovascular;  Laterality: N/A;   CORONARY CTO INTERVENTION N/A 08/30/2017   Procedure:  CORONARY CTO INTERVENTION;  Surgeon: Adrian Prows, MD;  Location: [facility];  Service: Cardiovascular;  Laterality: N/A;   CORONARY STENT INTERVENTION N/A 11/29/2017   Procedure: CORONARY STENT INTERVENTION;  Surgeon: Adrian Prows, MD;  Location: [facility];  Service: Cardiovascular;   Laterality: N/A;   INSERT / REPLACE / REMOVE PACEMAKER  ?2012   "I think it was placed in 2012"   KNEE ARTHROSCOPY Left 04/03/2020   Procedure: LEFT KNEE ARTHROSCOPY  WASHOUT;  Surgeon: Tania Ade, MD;  Location: [facility];  Service: Orthopedics;  Laterality: Left;   LEFT HEART CATH AND CORONARY ANGIOGRAPHY N/A 08/30/2017   Procedure: LEFT HEART CATH AND CORONARY ANGIOGRAPHY;  Surgeon: Adrian Prows, MD;  Location: [facility];  Service: Cardiovascular;  Laterality: N/A;   LEFT HEART CATH AND CORS/GRAFTS ANGIOGRAPHY N/A 08/05/2017   Procedure: LEFT HEART CATH AND CORS/GRAFTS ANGIOGRAPHY;  Surgeon: Adrian Prows, MD;  Location: [facility];  Service: Cardiovascular;  Laterality: N/A;   LEFT HEART CATH AND CORS/GRAFTS ANGIOGRAPHY N/A 06/30/2021   Procedure: LEFT HEART CATH AND CORS/GRAFTS ANGIOGRAPHY;  Surgeon: Adrian Prows, MD;  Location: [facility];  Service: Cardiovascular;  Laterality: N/A;   PPM GENERATOR CHANGEOUT N/A 06/17/2021   Procedure: PPM GENERATOR CHANGEOUT;  Surgeon: Evans Lance, MD;  Location: [facility];  Service: Cardiovascular;  Laterality: N/A;   Family History  Problem Relation Age of Onset   Lung cancer Mother    Stroke Father    Febrile seizures Brother     Social History   Tobacco Use   Smoking status: Former    Packs/day: 0.50    Years: 3.00    Pack years: 1.50    Types: Cigarettes    Quit date: 1960    Years since quitting: 63.0   Smokeless tobacco: Never  Substance Use Topics   Alcohol use: No   Marital Status: Widowed ROS  Review of Systems  Cardiovascular:  Positive for dyspnea on exertion (improved). Negative for chest pain (resolved), claudication, leg swelling, near-syncope, orthopnea, palpitations, paroxysmal nocturnal dyspnea and syncope.  Gastrointestinal:  Negative for melena.  Objective  Blood pressure 116/61, pulse 63, temperature 97.8 F (36.6 C), temperature source Temporal, height '6\' 2"'  (1.88 m), weight 174 lb  (78.9 kg), SpO2 99 %.  Vitals with BMI 07/10/2021 07/06/2021 06/30/2021  Height '6\' 2"'  '6\' 2"'  -  Weight 174 lbs 174 lbs 6 oz -  BMI 03.88 82.80 -  Systolic 034 917 915  Diastolic 61 60 69  Pulse 63 72 62     Physical Exam Vitals reviewed.  Constitutional:      Appearance: He is well-developed.  Neck:     Vascular: No carotid bruit or JVD.  Cardiovascular:     Rate and Rhythm: Normal rate and regular rhythm.     Pulses: Intact distal pulses.          Popliteal pulses are 2+ on the right side and 2+ on the left side.       Dorsalis pedis pulses are 2+ on the right side and 1+ on the left side.       Posterior tibial pulses are 0 on the right side and 0 on the left side.     Heart sounds: Heart sounds are distant. No murmur heard. Early systolic murmur is present at the upper right sternal border.    No gallop.     Comments: No JVD. Radial access site well-healed without  evidence of erythema, ecchymosis, hematoma, bruit. Pulmonary:     Effort: Pulmonary effort is normal.     Breath sounds: Normal breath sounds.  Musculoskeletal:        General: Normal range of motion.     Right lower leg: No edema.     Left lower leg: No edema.   Laboratory examination:   CMP Latest Ref Rng & Units 06/15/2021 04/07/2020 04/06/2020  Glucose 70 - 99 mg/dL 169(H) 125(H) 109(H)  BUN 8 - 27 mg/dL '18 19 18  ' Creatinine 0.76 - 1.27 mg/dL 1.26 0.78 0.73  Sodium 134 - 144 mmol/L 143 135 133(L)  Potassium 3.5 - 5.2 mmol/L 4.6 3.9 3.6  Chloride 96 - 106 mmol/L 106 102 99  CO2 20 - 29 mmol/L '21 25 25  ' Calcium 8.6 - 10.2 mg/dL 9.7 8.6(L) 8.4(L)  Total Protein 6.5 - 8.1 g/dL - - 5.5(L)  Total Bilirubin 0.3 - 1.2 mg/dL - - 1.1  Alkaline Phos 38 - 126 U/L - - 94  AST 15 - 41 U/L - - 22  ALT 0 - 44 U/L - - 30   CBC Latest Ref Rng & Units 06/15/2021 04/06/2020 04/05/2020  WBC 3.4 - 10.8 x10E3/uL 6.7 5.7 6.5  Hemoglobin 13.0 - 17.7 g/dL 13.3 10.9(L) 10.8(L)  Hematocrit 37.5 - 51.0 % 40.4 33.1(L) 32.7(L)   Platelets 150 - 450 x10E3/uL 145(L) 260 289   Lipid Panel     Component Value Date/Time   CHOL 108 02/05/2015 1119   TRIG 226 (H) 02/05/2015 1119   HDL 21 (L) 02/05/2015 1119   CHOLHDL 5.1 02/05/2015 1119   VLDL 45 (H) 02/05/2015 1119   LDLCALC 42 02/05/2015 1119   HEMOGLOBIN A1C Lab Results  Component Value Date   HGBA1C 6.4 (H) 04/02/2020   MPG 136.98 04/02/2020   TSH No results for input(s): TSH in the last 8760 hours.  External labs: 12/03/2020: A1c 6.7%. Total cholesterol 95, triglycerides 108, HDL 40, LDL 33. Hb 12.7/HCT 37.4, platelets 142.  Normal indicis. Serum glucose 144, BUN 21, creatinine 1.02, EGFR 69 mL, potassium 4.4.  CMP normal.  Allergies   Allergies  Allergen Reactions   Brilinta [Ticagrelor] Anaphylaxis and Shortness Of Breath    Difficulty breathing     Medications Prior to Visit:   Outpatient Medications Prior to Visit  Medication Sig Dispense Refill   Biotin 5000 MCG TABS Take 5,000 mcg by mouth daily.     dorzolamide-timolol (COSOPT) 22.3-6.8 MG/ML ophthalmic solution Place 1 drop into both eyes 2 (two) times daily.  10   ezetimibe (ZETIA) 10 MG tablet TAKE 1 TABLET BY MOUTH EVERY DAY AFTER SUPPER 30 tablet 2   hydrocortisone cream 1 % Apply 1 application topically 2 (two) times daily as needed for itching.     isosorbide mononitrate (IMDUR) 30 MG 24 hr tablet Take 1 tablet (30 mg total) by mouth daily. 30 tablet 3   latanoprost (XALATAN) 0.005 % ophthalmic solution Place 1 drop into both eyes at bedtime.  12   losartan (COZAAR) 25 MG tablet TAKE 1 TABLET BY MOUTH DAILY 90 tablet 1   metFORMIN (GLUCOPHAGE) 500 MG tablet Take 500 mg by mouth 2 (two) times daily with a meal.     metoprolol tartrate (LOPRESSOR) 25 MG tablet TAKE 1 TABLET BY MOUTH TWICE DAILY 180 tablet 3   nitroGLYCERIN (NITROSTAT) 0.4 MG SL tablet Place 1 tablet (0.4 mg total) under the tongue every 5 (five) minutes as needed for chest pain. 25 tablet  3   rosuvastatin  (CRESTOR) 20 MG tablet Take 20 mg by mouth at bedtime.     TRUE METRIX BLOOD GLUCOSE TEST test strip 2 (two) times daily.     vitamin B-12 (CYANOCOBALAMIN) 1000 MCG tablet Take 1,000 mcg by mouth daily.     XARELTO 20 MG TABS tablet TAKE 1 TABLET(20 MG) BY MOUTH DAILY WITH SUPPER (Patient taking differently: Take 20 mg by mouth daily with supper.) 30 tablet 3   No facility-administered medications prior to visit.   Final Medications at End of Visit    Current Meds  Medication Sig   Biotin 5000 MCG TABS Take 5,000 mcg by mouth daily.   dorzolamide-timolol (COSOPT) 22.3-6.8 MG/ML ophthalmic solution Place 1 drop into both eyes 2 (two) times daily.   ezetimibe (ZETIA) 10 MG tablet TAKE 1 TABLET BY MOUTH EVERY DAY AFTER SUPPER   hydrocortisone cream 1 % Apply 1 application topically 2 (two) times daily as needed for itching.   isosorbide mononitrate (IMDUR) 30 MG 24 hr tablet Take 1 tablet (30 mg total) by mouth daily.   latanoprost (XALATAN) 0.005 % ophthalmic solution Place 1 drop into both eyes at bedtime.   losartan (COZAAR) 25 MG tablet TAKE 1 TABLET BY MOUTH DAILY   metFORMIN (GLUCOPHAGE) 500 MG tablet Take 500 mg by mouth 2 (two) times daily with a meal.   metoprolol tartrate (LOPRESSOR) 25 MG tablet TAKE 1 TABLET BY MOUTH TWICE DAILY   nitroGLYCERIN (NITROSTAT) 0.4 MG SL tablet Place 1 tablet (0.4 mg total) under the tongue every 5 (five) minutes as needed for chest pain.   rosuvastatin (CRESTOR) 20 MG tablet Take 20 mg by mouth at bedtime.   TRUE METRIX BLOOD GLUCOSE TEST test strip 2 (two) times daily.   vitamin B-12 (CYANOCOBALAMIN) 1000 MCG tablet Take 1,000 mcg by mouth daily.   XARELTO 20 MG TABS tablet TAKE 1 TABLET(20 MG) BY MOUTH DAILY WITH SUPPER (Patient taking differently: Take 20 mg by mouth daily with supper.)   Radiology:   No results found.  Cardiac Studies:   Coronary Angiogram 11/29/2017:   (CABG 2007 [place], [place]): LIMA to LAD, Occluded SVG to RCA and OM by  angiogram S/P  PCI/orbital atherectomy S/P 2.5x15 Resoluute to prox Cx on 11/29/2017.  Lexiscan/modified Bruce Tetrofosmin stress test 02/06/2020: Lexiscan/modified Bruce nuclear stress test performed using 1-day protocol. Stress EKG is non-diagnostic, as this is pharmacological stress test. In addition, stress EKG at 110% MPHR showed AV paced rhythm.  SPECT images show medium sized, mild intensity, partially reversible perfusion defect in apical to basal inferior and basal inferoseptal myocardium. While septal perfusion defect may be due to paced rhythm, ischemia cannot be excluded. Recommend clinical correlation.  Stress LVEF 44%. High risk study. No significant change from 02/14/2017.   Echocardiogram 06/23/2021:  Normal LV systolic function with visual EF 55-60%. Left ventricle cavity  is normal in size. Moderate left ventricular hypertrophy. Normal global  wall motion. Normal diastolic filling pattern, normal LAP.  Mild to moderate aortic regurgitation.  Mild (Grade I) mitral regurgitation.  Mild tricuspid regurgitation. No evidence of pulmonary hypertension.  Compared to study 09/98/3382 diastolic function improved (indeterminate to normal), moderate TR is now mild, Aortic root was 33m to 310m.   Coronary angiography 06/30/2021: RCA: Long CTO.  He has ipsilateral collaterals as contralateral collaterals from the LAD. LM: Large vessel.  Mildly calcified. LAD: Ostial 40 to 50% stenosis, diffuse.  Continues as a large D1 as the LAD is occluded in the  proximal segment.   LIMA to LAD is widely RI: Moderate caliber vessel, ostial 40 to 50% stenosis, calcific. CX: 2.5 x 15 mm resolute Onyx DES placed 11/29/2017 in the ostium of the CX is widely patent.  CX gives origin to large OM1 and OM 2.  There is mild diffuse disease. LV: Mild inferior hypokinesis, preserved LVEF at 50 to 55%.  No significant MR. Hemodynamics: LV 93/0, EDP 7 mmHg.  Ao 89/41, mean 60 mmHg.  There was no pressure gradient  across the aortic valve.  Recommendation: Coronary anatomy is not significantly changed from 2019.  He has small vessel disease, continue medical therapy as indicated.  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  70 mL contrast utilized.  Pacemaker Medtronic dual chamber adapta [id number] on 11/20/2009   Pacemaker generator change out 06/17/2021  Scheduled In office pacemaker check 06/04/21  Single (S)/Dual (D)/BV: D. Presenting ASVP. Pacemaker dependant: Yes. Underlying SR with CHB. AP 43%, VP 79%. . AMS Episodes 0.  HVR 0 Longevity 30 days. Magnet rate: >85%. Lead measurements: Stable. Histogram: Low (L)/normal (N)/high (H) Normal. Patient activity NA.   Observations: Normal pacemaker function, EOL approaching. Changes: None. Will refer for generator change.   Remote pacemaker transmission 07/02/2021: AP 26%, VP 99%. Longevity 8 years and 10 months. Lead impedance and thresholds within normal limits. There were no high ventricular rate episodes, no mode switches. Normal dual-chamber pacemaker function.  EKG  06/09/2021: Ventricularly paced rhythm at a rate of 65 bpm.  No further analysis.  EKG 12/18/2020: Atrial fibrillation with ventricularly paced rhythm.  No further analysis.    09/20/2019: Atrially paced rhythm at rate of 78 bpm, left axis deviation, left anterior fascicular block.  Right bundle branch block.  No evidence of ischemia.   No significant change from  EKG 03/19/2019   Assessment     ICD-10-CM   1. Coronary artery disease involving native coronary artery of native heart without angina pectoris  I25.10     2. Primary hypertension  I10     3. Cardiac pacemaker in situ  Z95.0     4. Dyspnea on exertion  R06.09        No orders of the defined types were placed in this encounter.   There are no discontinued medications.    Recommendations:   Nicole Hafley  is a 80 y.o. male  with coronary artery disease and CABG in 2007 in [place]  with LIMA to LAD, SVG to OM which is occluded by angiography on 02/04/2015, occluded SVG to RCA. He underwent complex successful arthrectomy and PCI to Cx on 11/29/2017.  His past medical history also includes sick sinus syndrome status post Medtronic pacemaker implantation 2011, PAF, diabetes mellitus, hypertension and hyperlipidemia.  Patient presented 06/08/2021 for urgent visit with complaints of shortness of breath and chest pain.  Given history of stress test concerning for inferior ischemia shared decision was to proceed with cardiac catheterization.  However following that visit he was seen by Dr. Crissie Sickles and underwent pacemaker generator change out on 06/17/2021.  Following generator change out patient continued to have dyspnea on exertion and chest pain, although these have improved with the addition of Imdur at last office visit.  Again shared decision was to continue proceed with cardiac catheterization.  Left heart cath 06/30/2021 showed coronary anatomy has not significantly changed from 2019, patient does have small vessel disease, medical therapy recommended.  Repeat echocardiogram 06/23/2021 showed improvement of diastolic dysfunction to normal  and moderate TR is now mild.  Patient presents today for follow-up after cardiac catheterization.  Patient's chest pain has resolved since last visit with addition of Imdur as well as pacemaker generator change out.  His shortness of breath has significantly improved.  He does continue to have mild dyspnea on exertion, much better than last visit.  Patient states he is now able to do his daily activities without dyspnea, unlike previously.  Suspect residual dyspnea on exertion is related to deconditioning.  Encourage patient to increase physical activity and continue to monitor symptoms.  Blood pressure and lipids are well controlled.  Will not make changes to medications at this time.  Patient prefers to keep follow-up appointment with Dr. Einar Gip,  follow-up in 3 months accordingly.   Alethia Berthold, PA-C 07/10/2021, 9:30 AM Office: [phone]

## 2021-07-13 DIAGNOSIS — I10 Essential (primary) hypertension: Secondary | ICD-10-CM | POA: Diagnosis not present

## 2021-07-13 DIAGNOSIS — E118 Type 2 diabetes mellitus with unspecified complications: Secondary | ICD-10-CM | POA: Diagnosis not present

## 2021-07-13 DIAGNOSIS — E782 Mixed hyperlipidemia: Secondary | ICD-10-CM | POA: Diagnosis not present

## 2021-07-13 DIAGNOSIS — I480 Paroxysmal atrial fibrillation: Secondary | ICD-10-CM | POA: Diagnosis not present

## 2021-07-15 DIAGNOSIS — E782 Mixed hyperlipidemia: Secondary | ICD-10-CM | POA: Diagnosis not present

## 2021-07-15 DIAGNOSIS — I10 Essential (primary) hypertension: Secondary | ICD-10-CM | POA: Diagnosis not present

## 2021-07-15 DIAGNOSIS — Z951 Presence of aortocoronary bypass graft: Secondary | ICD-10-CM | POA: Diagnosis not present

## 2021-07-15 DIAGNOSIS — I480 Paroxysmal atrial fibrillation: Secondary | ICD-10-CM | POA: Diagnosis not present

## 2021-07-15 DIAGNOSIS — D696 Thrombocytopenia, unspecified: Secondary | ICD-10-CM | POA: Diagnosis not present

## 2021-07-15 DIAGNOSIS — E118 Type 2 diabetes mellitus with unspecified complications: Secondary | ICD-10-CM | POA: Diagnosis not present

## 2021-08-03 DIAGNOSIS — M6281 Muscle weakness (generalized): Secondary | ICD-10-CM | POA: Diagnosis not present

## 2021-08-03 DIAGNOSIS — R200 Anesthesia of skin: Secondary | ICD-10-CM | POA: Diagnosis not present

## 2021-08-03 DIAGNOSIS — R202 Paresthesia of skin: Secondary | ICD-10-CM | POA: Diagnosis not present

## 2021-08-17 DIAGNOSIS — I10 Essential (primary) hypertension: Secondary | ICD-10-CM | POA: Diagnosis not present

## 2021-08-17 DIAGNOSIS — M6281 Muscle weakness (generalized): Secondary | ICD-10-CM | POA: Diagnosis not present

## 2021-08-17 DIAGNOSIS — R200 Anesthesia of skin: Secondary | ICD-10-CM | POA: Diagnosis not present

## 2021-08-17 DIAGNOSIS — R202 Paresthesia of skin: Secondary | ICD-10-CM | POA: Diagnosis not present

## 2021-08-18 DIAGNOSIS — R278 Other lack of coordination: Secondary | ICD-10-CM | POA: Diagnosis not present

## 2021-08-18 DIAGNOSIS — M6281 Muscle weakness (generalized): Secondary | ICD-10-CM | POA: Diagnosis not present

## 2021-08-28 DIAGNOSIS — R278 Other lack of coordination: Secondary | ICD-10-CM | POA: Diagnosis not present

## 2021-08-28 DIAGNOSIS — M6281 Muscle weakness (generalized): Secondary | ICD-10-CM | POA: Diagnosis not present

## 2021-09-01 DIAGNOSIS — M6281 Muscle weakness (generalized): Secondary | ICD-10-CM | POA: Diagnosis not present

## 2021-09-01 DIAGNOSIS — R278 Other lack of coordination: Secondary | ICD-10-CM | POA: Diagnosis not present

## 2021-09-04 DIAGNOSIS — R278 Other lack of coordination: Secondary | ICD-10-CM | POA: Diagnosis not present

## 2021-09-04 DIAGNOSIS — M6281 Muscle weakness (generalized): Secondary | ICD-10-CM | POA: Diagnosis not present

## 2021-09-10 DIAGNOSIS — R278 Other lack of coordination: Secondary | ICD-10-CM | POA: Diagnosis not present

## 2021-09-10 DIAGNOSIS — M6281 Muscle weakness (generalized): Secondary | ICD-10-CM | POA: Diagnosis not present

## 2021-09-14 DIAGNOSIS — M6281 Muscle weakness (generalized): Secondary | ICD-10-CM | POA: Diagnosis not present

## 2021-09-14 DIAGNOSIS — R278 Other lack of coordination: Secondary | ICD-10-CM | POA: Diagnosis not present

## 2021-09-16 DIAGNOSIS — I495 Sick sinus syndrome: Secondary | ICD-10-CM | POA: Diagnosis not present

## 2021-09-16 DIAGNOSIS — Z45018 Encounter for adjustment and management of other part of cardiac pacemaker: Secondary | ICD-10-CM | POA: Diagnosis not present

## 2021-09-17 DIAGNOSIS — R278 Other lack of coordination: Secondary | ICD-10-CM | POA: Diagnosis not present

## 2021-09-17 DIAGNOSIS — M6281 Muscle weakness (generalized): Secondary | ICD-10-CM | POA: Diagnosis not present

## 2021-09-22 DIAGNOSIS — M6281 Muscle weakness (generalized): Secondary | ICD-10-CM | POA: Diagnosis not present

## 2021-09-22 DIAGNOSIS — R278 Other lack of coordination: Secondary | ICD-10-CM | POA: Diagnosis not present

## 2021-09-23 ENCOUNTER — Other Ambulatory Visit: Payer: Self-pay

## 2021-09-23 ENCOUNTER — Ambulatory Visit: Payer: Medicare HMO | Admitting: Cardiology

## 2021-09-23 ENCOUNTER — Encounter: Payer: Self-pay | Admitting: Cardiology

## 2021-09-23 VITALS — BP 136/72 | HR 70 | Temp 98.5°F | Resp 17 | Ht 74.0 in | Wt 172.0 lb

## 2021-09-23 DIAGNOSIS — R0609 Other forms of dyspnea: Secondary | ICD-10-CM

## 2021-09-23 DIAGNOSIS — I25118 Atherosclerotic heart disease of native coronary artery with other forms of angina pectoris: Secondary | ICD-10-CM

## 2021-09-23 DIAGNOSIS — Z950 Presence of cardiac pacemaker: Secondary | ICD-10-CM | POA: Diagnosis not present

## 2021-09-23 DIAGNOSIS — I480 Paroxysmal atrial fibrillation: Secondary | ICD-10-CM

## 2021-09-23 MED ORDER — ISOSORBIDE MONONITRATE ER 30 MG PO TB24: 30.0000 mg | ORAL_TABLET | Freq: Every day | ORAL | 3 refills | Status: DC

## 2021-09-23 NOTE — Progress Notes (Signed)
Primary Physician/Referring:  Deland Pretty, MD  Patient ID: Jesse [name], male    DOB: 1941-04-18, 81 y.o.   MRN: [id number]  Chief Complaint  Patient presents with   Coronary Artery Disease   Atrial Fibrillation   Follow-up    6 months   HPI:    Jesse [name]  is a 81 y.o. male  with coronary artery disease and CABG in 2007 in [place] with LIMA to LAD, SVG to OM which is occluded by angiography on 02/04/2015, occluded SVG to RCA. He underwent complex successful arthrectomy and PCI to Cx on 11/29/2017.  His past medical history also includes sick sinus syndrome status post Medtronic pacemaker implantation 2011, generator change out 06/17/2021, PAF, diabetes mellitus, hypertension and hyperlipidemia.  Due to dyspnea on exertion, he underwent coronary angiography on 11/28/2020 revealing no change from 2019 and echocardiogram revealing improved diastolic function.  He has dyspnea only when he starts walking in the usual minute also and after that he is completely asymptomatic.  No PND orthopnea or leg edema.  He has had no recurrence of chest pain.   Past Medical History:  Diagnosis Date   Angina pectoris ([place]) 02/03/2015   CAD (coronary artery disease), native coronary artery 08/30/2018   2007 [place], [place]: LIMA to LAD, Occluded SVG to RCA and OM by angiogram S/P  PCI/orbital atherectomy S/P 2.5x15 Resoluute to prox Cx on 11/29/2017.   Coronary artery disease    High cholesterol    Hypertension    Myocardial infarction [facility]) 2017/2018?   NSTEMI (non-ST elevated myocardial infarction) ([place]) 02/03/2015   Presence of permanent cardiac pacemaker    Type II diabetes mellitus (HCC)    Social History   Tobacco Use   Smoking status: Former    Packs/day: 0.50    Years: 3.00    Pack years: 1.50    Types: Cigarettes    Quit date: 1960    Years since quitting: 63.2   Smokeless tobacco: Never  Substance Use Topics   Alcohol use: No   Marital Status: Widowed ROS  Review of Systems   Cardiovascular:  Positive for dyspnea on exertion. Negative for chest pain and leg swelling.  Objective  Blood pressure 136/72, pulse 70, temperature 98.5 F (36.9 C), temperature source Temporal, resp. rate 17, height _0  (1.88 m), weight 172 lb (78 kg), SpO2 99 %.  Vitals with BMI 09/23/2021 07/10/2021 07/06/2021  Height _1  _2  _3   Weight 172 lbs 174 lbs 174 lbs 6 oz  BMI 22.07 56.43 32.95  Systolic 188 416 606  Diastolic 72 61 60  Pulse 70 63 72     Physical Exam Vitals reviewed.  Constitutional:      Appearance: He is well-developed.  Neck:     Vascular: No carotid bruit or JVD.  Cardiovascular:     Rate and Rhythm: Normal rate and regular rhythm.     Pulses: Intact distal pulses.          Popliteal pulses are 2+ on the right side and 2+ on the left side.       Dorsalis pedis pulses are 2+ on the right side and 1+ on the left side.       Posterior tibial pulses are 0 on the right side and 0 on the left side.     Heart sounds: Heart sounds are distant. No murmur heard. Early systolic murmur is present at the upper right sternal border.    No gallop.  Comments: No JVD. Radial access site well-healed without evidence of erythema, ecchymosis, hematoma, bruit. Pulmonary:     Effort: Pulmonary effort is normal.     Breath sounds: Normal breath sounds.  Abdominal:     General: Bowel sounds are normal.     Palpations: Abdomen is soft.  Musculoskeletal:     Right lower leg: No edema.     Left lower leg: No edema.   Laboratory examination:   CMP Latest Ref Rng & Units 06/15/2021 04/07/2020 04/06/2020  Glucose 70 - 99 mg/dL 169(H) 125(H) 109(H)  BUN 8 - 27 mg/dL _0 Creatinine 0.76 - 1.27 mg/dL 1.26 0.78 0.73  Sodium 134 - 144 mmol/L 143 135 133(L)  Potassium 3.5 - 5.2 mmol/L 4.6 3.9 3.6  Chloride 96 - 106 mmol/L 106 102 99  CO2 20 - 29 mmol/L _1 Calcium 8.6 - 10.2 mg/dL 9.7 8.6(L) 8.4(L)  Total Protein 6.5 - 8.1 g/dL - - 5.5(L)  Total Bilirubin 0.3  - 1.2 mg/dL - - 1.1  Alkaline Phos 38 - 126 U/L - - 94  AST 15 - 41 U/L - - 22  ALT 0 - 44 U/L - - 30   CBC Latest Ref Rng & Units 06/15/2021 04/06/2020 04/05/2020  WBC 3.4 - 10.8 x10E3/uL 6.7 5.7 6.5  Hemoglobin 13.0 - 17.7 g/dL 13.3 10.9(L) 10.8(L)  Hematocrit 37.5 - 51.0 % 40.4 33.1(L) 32.7(L)  Platelets 150 - 450 x10E3/uL 145(L) 260 289   Lipid Panel     Component Value Date/Time   CHOL 108 02/05/2015 1119   TRIG 226 (H) 02/05/2015 1119   HDL 21 (L) 02/05/2015 1119   CHOLHDL 5.1 02/05/2015 1119   VLDL 45 (H) 02/05/2015 1119   LDLCALC 42 02/05/2015 1119   HEMOGLOBIN A1C Lab Results  Component Value Date   HGBA1C 6.4 (H) 04/02/2020   MPG 136.98 04/02/2020   TSH No results for input(s): TSH in the last 8760 hours.  External labs: 12/03/2020: A1c 6.7%. Total cholesterol 95, triglycerides 108, HDL 40, LDL 33. Hb 12.7/HCT 37.4, platelets 142.  Normal indicis. Serum glucose 144, BUN 21, creatinine 1.02, EGFR 69 mL, potassium 4.4.  CMP normal.  Allergies   Allergies  Allergen Reactions   Brilinta [Ticagrelor] Anaphylaxis and Shortness Of Breath    Difficulty breathing      Final Medications at End of Visit     Current Outpatient Medications:    Biotin 5000 MCG TABS, Take 5,000 mcg by mouth daily., Disp: , Rfl:    dorzolamide-timolol (COSOPT) 22.3-6.8 MG/ML ophthalmic solution, Place 1 drop into both eyes 2 (two) times daily., Disp: , Rfl: 10   ezetimibe (ZETIA) 10 MG tablet, TAKE 1 TABLET BY MOUTH EVERY DAY AFTER SUPPER, Disp: 30 tablet, Rfl: 2   hydrocortisone cream 1 %, Apply 1 application topically 2 (two) times daily as needed for itching., Disp: , Rfl:    latanoprost (XALATAN) 0.005 % ophthalmic solution, Place 1 drop into both eyes at bedtime., Disp: , Rfl: 12   losartan (COZAAR) 25 MG tablet, TAKE 1 TABLET BY MOUTH DAILY, Disp: 90 tablet, Rfl: 1   metFORMIN (GLUCOPHAGE) 500 MG tablet, Take 500 mg by mouth 2 (two) times daily with a meal., Disp: , Rfl:     metoprolol tartrate (LOPRESSOR) 25 MG tablet, TAKE 1 TABLET BY MOUTH TWICE DAILY, Disp: 180 tablet, Rfl: 3   nitroGLYCERIN (NITROSTAT) 0.4 MG SL tablet, Place 1 tablet (0.4 mg total) under the tongue every 5 (  five) minutes as needed for chest pain., Disp: 25 tablet, Rfl: 3   rosuvastatin (CRESTOR) 20 MG tablet, Take 20 mg by mouth at bedtime., Disp: , Rfl:    TRUE METRIX BLOOD GLUCOSE TEST test strip, 2 (two) times daily., Disp: , Rfl:    vitamin B-12 (CYANOCOBALAMIN) 1000 MCG tablet, Take 1,000 mcg by mouth daily., Disp: , Rfl:    XARELTO 20 MG TABS tablet, TAKE 1 TABLET(20 MG) BY MOUTH DAILY WITH SUPPER (Patient taking differently: Take 20 mg by mouth daily with supper.), Disp: 30 tablet, Rfl: 3   isosorbide mononitrate (IMDUR) 30 MG 24 hr tablet, Take 1 tablet (30 mg total) by mouth daily., Disp: 100 tablet, Rfl: 3   Radiology:   CXR 04/02/2020: Left pacer remains in place, unchanged. Heart is normal size. No confluent opacities or effusions. No acute bony abnormality.  Cardiac Studies:   Lexiscan/modified Bruce Tetrofosmin stress test 02/06/2020: Lexiscan/modified Bruce nuclear stress test performed using 1-day protocol. Stress EKG is non-diagnostic, as this is pharmacological stress test. In addition, stress EKG at 110% MPHR showed AV paced rhythm.  SPECT images show medium sized, mild intensity, partially reversible perfusion defect in apical to basal inferior and basal inferoseptal myocardium. While septal perfusion defect may be due to paced rhythm, ischemia cannot be excluded. Recommend clinical correlation.  Stress LVEF 44%. High risk study. No significant change from 02/14/2017.   Echocardiogram 06/23/2021:  Normal LV systolic function with visual EF 55-60%. Left ventricle cavity  is normal in size. Moderate left ventricular hypertrophy. Normal global  wall motion. Normal diastolic filling pattern, normal LAP.  Mild to moderate aortic regurgitation.  Mild (Grade I) mitral  regurgitation.  Mild tricuspid regurgitation. No evidence of pulmonary hypertension.  Compared to study 92/42/6834 diastolic function improved (indeterminate to normal), moderate TR is now mild, Aortic root was 19mm to 21mm).   Coronary angiography 06/30/2021: RCA: Long CTO.  He has ipsilateral collaterals as contralateral collaterals from the LAD. LM: Large vessel.  Mildly calcified. LAD: Ostial 40 to 50% stenosis, diffuse.  Continues as a large D1 as the LAD is occluded in the proximal segment.   LIMA to LAD is widely RI: Moderate caliber vessel, ostial 40 to 50% stenosis, calcific. CX: 2.5 x 15 mm resolute Onyx DES placed 11/29/2017 in the ostium of the CX is widely patent.  CX gives origin to large OM1 and OM 2.  There is mild diffuse disease. LV: Mild inferior hypokinesis, preserved LVEF at 50 to 55%.  No significant MR. Hemodynamics: LV 93/0, EDP 7 mmHg.  Ao 89/41, mean 60 mmHg.  There was no pressure gradient across the aortic valve.  Recommendation: Coronary anatomy is not significantly changed from 2019.  He has small vessel disease, continue medical therapy as indicated.  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  70 mL contrast utilized.  Pacemaker Medtronic dual chamber AZURE XT DR MRI, Gen change 06/17/2021   Remote dual-chamber pacemaker transmission 09/19/2021: AP 26%, VP 99.7%.  Lead impedance and thresholds within normal limits.  Longevity 8 years and 10 months.  There were no mode switches, no high ventricular rate episodes.  Normal pacemaker function.  EKG   EKG 09/23/2021: Sinus rhythm at rate of 69 bpm with ventricularly paced rhythm.  No further analysis.   EKG 12/18/2020: Atrial fibrillation with ventricularly paced rhythm.  No further analysis.    09/20/2019: Atrially paced rhythm at rate of 78 bpm, left axis deviation, left anterior fascicular block.  Right bundle  branch block.  No evidence of ischemia.   No significant change from  EKG  03/19/2019   Assessment     ICD-10-CM   1. Dyspnea on exertion  R06.09     2. Coronary artery disease of native artery of native heart with stable angina pectoris (HCC)  I25.118 EKG 12-Lead    isosorbide mononitrate (IMDUR) 30 MG 24 hr tablet    3. Pacemaker Medtronic dual chamber Azure XT DR MRI SureScan Gen change 06/17/21  Z95.0     4. PAF (paroxysmal atrial fibrillation) ([place]). CHA2DS2-VASc Score is 5 (A, HTN, DM Vasc Dz). Yearly risk of stroke 6.7%  I48.0       Meds ordered this encounter  Medications   isosorbide mononitrate (IMDUR) 30 MG 24 hr tablet    Sig: Take 1 tablet (30 mg total) by mouth daily.    Dispense:  100 tablet    Refill:  3     Medications Discontinued During This Encounter  Medication Reason   isosorbide mononitrate (IMDUR) 30 MG 24 hr tablet Reorder   Recommendations:   Daunte Oestreich  is a 81 y.o. male  with coronary artery disease and CABG in 2007 in [place] with LIMA to LAD, SVG to OM which is occluded by angiography on 02/04/2015, occluded SVG to RCA. He underwent complex successful arthrectomy and PCI to Cx on 11/29/2017.  His past medical history also includes sick sinus syndrome status post Medtronic pacemaker implantation 2011, generator change out 06/17/2021, PAF, diabetes mellitus, hypertension and hyperlipidemia.  Due to dyspnea on exertion, he underwent coronary angiography on 11/28/2020 revealing no change from 2019 and echocardiogram revealing improved diastolic function.  He has dyspnea only when he starts walking in the usual minute also and after that he is completely asymptomatic.  There is no clinical evidence of heart failure.  May be he needs adjustment in sensitivity in pacemaker, he has an appointment to see Dr. Lovena Le tomorrow and advised him to mention this to them.  I can follow-up on the pacemaker once they discharged him.  Remote pacemaker transmission reviewed with the patient.  Normal function.  Otherwise from cardiac standpoint he  remains stable, anginal symptoms are stable as well.  Blood pressure is well controlled.  Tolerating anticoagulation without bleeding diathesis.  I will see him back in 6 months for follow-up.    Adrian Prows, PA-C 09/23/2021, 2:49 PM Office: [phone]

## 2021-09-24 ENCOUNTER — Encounter: Payer: Self-pay | Admitting: Internal Medicine

## 2021-09-24 ENCOUNTER — Ambulatory Visit: Payer: Medicare HMO | Admitting: Internal Medicine

## 2021-09-24 VITALS — BP 132/70 | HR 79 | Resp 98 | Ht 74.0 in | Wt 171.6 lb

## 2021-09-24 DIAGNOSIS — I495 Sick sinus syndrome: Secondary | ICD-10-CM | POA: Diagnosis not present

## 2021-09-24 DIAGNOSIS — Z950 Presence of cardiac pacemaker: Secondary | ICD-10-CM

## 2021-09-24 LAB — CUP PACEART INCLINIC DEVICE CHECK
Battery Remaining Longevity: 146 mo
Battery Voltage: 3.2 V
Brady Statistic AP VP Percent: 18.11 %
Brady Statistic AP VS Percent: 0 %
Brady Statistic AS VP Percent: 80.28 %
Brady Statistic AS VS Percent: 1.61 %
Brady Statistic RA Percent Paced: 18.77 %
Brady Statistic RV Percent Paced: 98.39 %
Date Time Interrogation Session: 20230302164327
Implantable Lead Implant Date: 20110428
Implantable Lead Implant Date: 20110428
Implantable Lead Location: 753859
Implantable Lead Location: 753860
Implantable Lead Model: 5076
Implantable Lead Model: 5076
Implantable Pulse Generator Implant Date: 20221123
Lead Channel Impedance Value: 361 Ohm
Lead Channel Impedance Value: 380 Ohm
Lead Channel Impedance Value: 399 Ohm
Lead Channel Impedance Value: 456 Ohm
Lead Channel Pacing Threshold Amplitude: 0.5 V
Lead Channel Pacing Threshold Amplitude: 0.5 V
Lead Channel Pacing Threshold Pulse Width: 0.4 ms
Lead Channel Pacing Threshold Pulse Width: 0.4 ms
Lead Channel Sensing Intrinsic Amplitude: 3.625 mV
Lead Channel Sensing Intrinsic Amplitude: 4.75 mV
Lead Channel Setting Pacing Amplitude: 1.5 V
Lead Channel Setting Pacing Amplitude: 2 V
Lead Channel Setting Pacing Pulse Width: 0.4 ms
Lead Channel Setting Sensing Sensitivity: 5.6 mV

## 2021-09-24 NOTE — Patient Instructions (Addendum)
Medication Instructions:  Your physician recommends that you continue on your current medications as directed. Please refer to the Current Medication list given to you today.  Labwork: None ordered.  Testing/Procedures: None ordered.  Follow-Up: Your physician wants you to follow-up in: as needed with Gregg Taylor, MD    Any Other Special Instructions Will Be Listed Below (If Applicable).  If you need a refill on your cardiac medications before your next appointment, please call your pharmacy.       

## 2021-09-24 NOTE — Progress Notes (Signed)
? ? ? ? ?HPI ?[name] returns today for followup. He is a pleasant 81 yo man with a h/o CHB who underwent PPM gen change out about 3 months ago. He has done well in the interim with no chest pain or sob. No edema. ?Allergies  ?Allergen Reactions  ? Brilinta [Ticagrelor] Anaphylaxis and Shortness Of Breath  ?  Difficulty breathing   ? ? ? ?Current Outpatient Medications  ?Medication Sig Dispense Refill  ? Biotin 5000 MCG TABS Take 5,000 mcg by mouth daily.    ? dorzolamide-timolol (COSOPT) 22.3-6.8 MG/ML ophthalmic solution Place 1 drop into both eyes 2 (two) times daily.  10  ? ezetimibe (ZETIA) 10 MG tablet TAKE 1 TABLET BY MOUTH EVERY DAY AFTER SUPPER 30 tablet 2  ? hydrocortisone cream 1 % Apply 1 application topically 2 (two) times daily as needed for itching.    ? isosorbide mononitrate (IMDUR) 30 MG 24 hr tablet Take 1 tablet (30 mg total) by mouth daily. 100 tablet 3  ? latanoprost (XALATAN) 0.005 % ophthalmic solution Place 1 drop into both eyes at bedtime.  12  ? losartan (COZAAR) 25 MG tablet TAKE 1 TABLET BY MOUTH DAILY 90 tablet 1  ? metFORMIN (GLUCOPHAGE) 500 MG tablet Take 500 mg by mouth 2 (two) times daily with a meal.    ? metoprolol tartrate (LOPRESSOR) 25 MG tablet TAKE 1 TABLET BY MOUTH TWICE DAILY 180 tablet 3  ? nitroGLYCERIN (NITROSTAT) 0.4 MG SL tablet Place 1 tablet (0.4 mg total) under the tongue every 5 (five) minutes as needed for chest pain. 25 tablet 3  ? rosuvastatin (CRESTOR) 20 MG tablet Take 20 mg by mouth at bedtime.    ? TRUE METRIX BLOOD GLUCOSE TEST test strip 2 (two) times daily.    ? vitamin B-12 (CYANOCOBALAMIN) 1000 MCG tablet Take 1,000 mcg by mouth daily.    ? XARELTO 20 MG TABS tablet TAKE 1 TABLET(20 MG) BY MOUTH DAILY WITH SUPPER (Patient taking differently: Take 20 mg by mouth daily with supper.) 30 tablet 3  ? ?No current facility-administered medications for this visit.  ? ? ? ?Past Medical History:  ?Diagnosis Date  ? Angina pectoris ([place]) 02/03/2015  ? CAD  (coronary artery disease), native coronary artery 08/30/2018  ? 2007 [place], [place]: LIMA to LAD, Occluded SVG to RCA and OM by angiogram S/P  PCI/orbital atherectomy S/P 2.5x15 Resoluute to prox Cx on 11/29/2017.  ? Coronary artery disease   ? High cholesterol   ? Hypertension   ? Myocardial infarction [facility]) 2017/2018?  ? NSTEMI (non-ST elevated myocardial infarction) ([place]) 02/03/2015  ? Presence of permanent cardiac pacemaker   ? Type II diabetes mellitus ([place])   ? ? ?ROS: ? ? All systems reviewed and negative except as noted in the HPI. ? ? ?Past Surgical History:  ?Procedure Laterality Date  ? CARDIAC CATHETERIZATION N/A 02/04/2015  ? Procedure: Left Heart Cath and Cors/Grafts Angiography;  Surgeon: Adrian Prows, MD;  Location: [place] [facility];  Service: Cardiovascular;  Laterality: N/A;  ? CARDIAC CATHETERIZATION N/A 02/04/2015  ? Procedure: Coronary Stent Intervention;  Surgeon: Adrian Prows, MD;  Location: [place] [facility];  Service: Cardiovascular;  Laterality: N/A;  ? CORONARY ANGIOGRAPHY N/A 10/18/2017  ? Procedure: CORONARY ANGIOGRAPHY;  Surgeon: Adrian Prows, MD;  Location: [place] [facility];  Service: Cardiovascular;  Laterality: N/A;  ? CORONARY ANGIOPLASTY    ? CORONARY ANGIOPLASTY WITH STENT PLACEMENT    ? "i've got 5-6 stents total" (08/30/2017)  ?  CORONARY ARTERY BYPASS GRAFT  2007  ? CABG X3  ? CORONARY ATHERECTOMY N/A 10/18/2017  ? Procedure: CORONARY ATHERECTOMY;  Surgeon: Adrian Prows, MD;  Location: [place] [facility];  Service: Cardiovascular;  Laterality: N/A;  ? CORONARY ATHERECTOMY N/A 11/29/2017  ? Procedure: CORONARY ATHERECTOMY;  Surgeon: Adrian Prows, MD;  Location: [place] [facility];  Service: Cardiovascular;  Laterality: N/A;  ? CORONARY CTO INTERVENTION N/A 08/30/2017  ? Procedure: CORONARY CTO INTERVENTION;  Surgeon: Adrian Prows, MD;  Location: [place] [facility];  Service: Cardiovascular;  Laterality: N/A;  ? CORONARY STENT INTERVENTION N/A 11/29/2017  ? Procedure: CORONARY STENT INTERVENTION;   Surgeon: Adrian Prows, MD;  Location: [place] [facility];  Service: Cardiovascular;  Laterality: N/A;  ? INSERT / REPLACE / REMOVE PACEMAKER  ?2012  ? "I think it was placed in 2012"  ? KNEE ARTHROSCOPY Left 04/03/2020  ? Procedure: LEFT KNEE ARTHROSCOPY  WASHOUT;  Surgeon: Tania Ade, MD;  Location: [facility];  Service: Orthopedics;  Laterality: Left;  ? LEFT HEART CATH AND CORONARY ANGIOGRAPHY N/A 08/30/2017  ? Procedure: LEFT HEART CATH AND CORONARY ANGIOGRAPHY;  Surgeon: Adrian Prows, MD;  Location: [place] [facility];  Service: Cardiovascular;  Laterality: N/A;  ? LEFT HEART CATH AND CORS/GRAFTS ANGIOGRAPHY N/A 08/05/2017  ? Procedure: LEFT HEART CATH AND CORS/GRAFTS ANGIOGRAPHY;  Surgeon: Adrian Prows, MD;  Location: [place] [facility];  Service: Cardiovascular;  Laterality: N/A;  ? LEFT HEART CATH AND CORS/GRAFTS ANGIOGRAPHY N/A 06/30/2021  ? Procedure: LEFT HEART CATH AND CORS/GRAFTS ANGIOGRAPHY;  Surgeon: Adrian Prows, MD;  Location: [place] [facility];  Service: Cardiovascular;  Laterality: N/A;  ? PPM GENERATOR CHANGEOUT N/A 06/17/2021  ? Procedure: PPM GENERATOR CHANGEOUT;  Surgeon: Evans Lance, MD;  Location: [place] [facility];  Service: Cardiovascular;  Laterality: N/A;  ? ? ? ?Family History  ?Problem Relation Age of Onset  ? Lung cancer Mother   ? Stroke Father   ? Febrile seizures Brother   ? ? ? ?Social History  ? ?Socioeconomic History  ? Marital status: Widowed  ?  Spouse name: Not on file  ? Number of children: 0  ? Years of education: Not on file  ? Highest education level: Not on file  ?Occupational History  ? Not on file  ?Tobacco Use  ? Smoking status: Former  ?  Packs/day: 0.50  ?  Years: 3.00  ?  Pack years: 1.50  ?  Types: Cigarettes  ?  Quit date: 71  ?  Years since quitting: 63.2  ? Smokeless tobacco: Never  ?Vaping Use  ? Vaping Use: Never used  ?Substance and Sexual Activity  ? Alcohol use: No  ? Drug use: No  ? Sexual activity: Yes  ?Other Topics Concern  ? Not on file  ?Social  History Narrative  ? Not on file  ? ?Social Determinants of Health  ? ?Financial Resource Strain: Not on file  ?Food Insecurity: Not on file  ?Transportation Needs: Not on file  ?Physical Activity: Not on file  ?Stress: Not on file  ?Social Connections: Not on file  ?Intimate Partner Violence: Not on file  ? ? ? ?BP 132/70   Pulse 79   Resp (!) 98   Ht 6\' 2"  (1.88 m)   Wt 171 lb 9.6 oz (77.8 kg)   BMI 22.03 kg/m?  ? ?Physical Exam: ? ?Well appearing NAD ?HEENT: Unremarkable ?Neck:  No JVD, no thyromegally ?Lymphatics:  No adenopathy ?Back:  No  CVA tenderness ?Lungs:  Clear with no wheezes ?HEART:  Regular rate rhythm, no murmurs, no rubs, no clicks ?Abd:  soft, positive bowel sounds, no organomegally, no rebound, no guarding ?Ext:  2 plus pulses, no edema, no cyanosis, no clubbing ?Skin:  No rashes no nodules ?Neuro:  CN II through XII intact, motor grossly intact ? ?EKG - reviewed ? ?DEVICE  ?Normal device function.  See PaceArt for details.  ? ?Assess/Plan:  ?CHB - he is asymptomatic s/p PPM insertion.  ?PPM - his medtronic DDD PM is working normally.  ?HTN - his bp is well controlled.  ? ?Jesse Overlie Macayla Ekdahl,MD ?

## 2021-09-25 DIAGNOSIS — R278 Other lack of coordination: Secondary | ICD-10-CM | POA: Diagnosis not present

## 2021-09-25 DIAGNOSIS — M6281 Muscle weakness (generalized): Secondary | ICD-10-CM | POA: Diagnosis not present

## 2021-09-30 DIAGNOSIS — M6281 Muscle weakness (generalized): Secondary | ICD-10-CM | POA: Diagnosis not present

## 2021-09-30 DIAGNOSIS — R278 Other lack of coordination: Secondary | ICD-10-CM | POA: Diagnosis not present

## 2021-10-02 DIAGNOSIS — R278 Other lack of coordination: Secondary | ICD-10-CM | POA: Diagnosis not present

## 2021-10-02 DIAGNOSIS — M6281 Muscle weakness (generalized): Secondary | ICD-10-CM | POA: Diagnosis not present

## 2021-10-07 DIAGNOSIS — M6281 Muscle weakness (generalized): Secondary | ICD-10-CM | POA: Diagnosis not present

## 2021-10-13 DIAGNOSIS — M791 Myalgia, unspecified site: Secondary | ICD-10-CM | POA: Diagnosis not present

## 2021-10-13 DIAGNOSIS — R7989 Other specified abnormal findings of blood chemistry: Secondary | ICD-10-CM | POA: Diagnosis not present

## 2021-10-13 DIAGNOSIS — I10 Essential (primary) hypertension: Secondary | ICD-10-CM | POA: Diagnosis not present

## 2021-10-13 DIAGNOSIS — T466X5A Adverse effect of antihyperlipidemic and antiarteriosclerotic drugs, initial encounter: Secondary | ICD-10-CM | POA: Diagnosis not present

## 2021-10-13 DIAGNOSIS — I2510 Atherosclerotic heart disease of native coronary artery without angina pectoris: Secondary | ICD-10-CM | POA: Diagnosis not present

## 2021-10-13 DIAGNOSIS — I480 Paroxysmal atrial fibrillation: Secondary | ICD-10-CM | POA: Diagnosis not present

## 2021-10-13 DIAGNOSIS — I252 Old myocardial infarction: Secondary | ICD-10-CM | POA: Diagnosis not present

## 2021-10-13 DIAGNOSIS — E118 Type 2 diabetes mellitus with unspecified complications: Secondary | ICD-10-CM | POA: Diagnosis not present

## 2021-10-13 DIAGNOSIS — E782 Mixed hyperlipidemia: Secondary | ICD-10-CM | POA: Diagnosis not present

## 2021-10-14 DIAGNOSIS — R278 Other lack of coordination: Secondary | ICD-10-CM | POA: Diagnosis not present

## 2021-10-14 DIAGNOSIS — M6281 Muscle weakness (generalized): Secondary | ICD-10-CM | POA: Diagnosis not present

## 2021-10-28 DIAGNOSIS — R278 Other lack of coordination: Secondary | ICD-10-CM | POA: Diagnosis not present

## 2021-10-28 DIAGNOSIS — M6281 Muscle weakness (generalized): Secondary | ICD-10-CM | POA: Diagnosis not present

## 2021-12-03 DIAGNOSIS — H04123 Dry eye syndrome of bilateral lacrimal glands: Secondary | ICD-10-CM | POA: Diagnosis not present

## 2021-12-03 DIAGNOSIS — H25812 Combined forms of age-related cataract, left eye: Secondary | ICD-10-CM | POA: Diagnosis not present

## 2021-12-03 DIAGNOSIS — E119 Type 2 diabetes mellitus without complications: Secondary | ICD-10-CM | POA: Diagnosis not present

## 2021-12-03 DIAGNOSIS — H401132 Primary open-angle glaucoma, bilateral, moderate stage: Secondary | ICD-10-CM | POA: Diagnosis not present

## 2021-12-03 DIAGNOSIS — H2511 Age-related nuclear cataract, right eye: Secondary | ICD-10-CM | POA: Diagnosis not present

## 2021-12-10 DIAGNOSIS — E782 Mixed hyperlipidemia: Secondary | ICD-10-CM | POA: Diagnosis not present

## 2021-12-10 DIAGNOSIS — E118 Type 2 diabetes mellitus with unspecified complications: Secondary | ICD-10-CM | POA: Diagnosis not present

## 2021-12-10 DIAGNOSIS — I10 Essential (primary) hypertension: Secondary | ICD-10-CM | POA: Diagnosis not present

## 2021-12-15 DIAGNOSIS — R29898 Other symptoms and signs involving the musculoskeletal system: Secondary | ICD-10-CM | POA: Diagnosis not present

## 2021-12-15 DIAGNOSIS — D72819 Decreased white blood cell count, unspecified: Secondary | ICD-10-CM | POA: Diagnosis not present

## 2021-12-15 DIAGNOSIS — D6869 Other thrombophilia: Secondary | ICD-10-CM | POA: Diagnosis not present

## 2021-12-15 DIAGNOSIS — I10 Essential (primary) hypertension: Secondary | ICD-10-CM | POA: Diagnosis not present

## 2021-12-15 DIAGNOSIS — Z1212 Encounter for screening for malignant neoplasm of rectum: Secondary | ICD-10-CM | POA: Diagnosis not present

## 2021-12-15 DIAGNOSIS — Z23 Encounter for immunization: Secondary | ICD-10-CM | POA: Diagnosis not present

## 2021-12-15 DIAGNOSIS — E782 Mixed hyperlipidemia: Secondary | ICD-10-CM | POA: Diagnosis not present

## 2021-12-15 DIAGNOSIS — Z0000 Encounter for general adult medical examination without abnormal findings: Secondary | ICD-10-CM | POA: Diagnosis not present

## 2021-12-15 DIAGNOSIS — I480 Paroxysmal atrial fibrillation: Secondary | ICD-10-CM | POA: Diagnosis not present

## 2021-12-15 DIAGNOSIS — I517 Cardiomegaly: Secondary | ICD-10-CM | POA: Diagnosis not present

## 2021-12-19 DIAGNOSIS — Z45018 Encounter for adjustment and management of other part of cardiac pacemaker: Secondary | ICD-10-CM | POA: Diagnosis not present

## 2021-12-19 DIAGNOSIS — I495 Sick sinus syndrome: Secondary | ICD-10-CM | POA: Diagnosis not present

## 2021-12-29 ENCOUNTER — Ambulatory Visit
Admission: RE | Admit: 2021-12-29 | Discharge: 2021-12-29 | Disposition: A | Discharge status: Home / Self Care | Payer: Medicare HMO | Source: Ambulatory Visit | Attending: Neurology | Admitting: Neurology

## 2021-12-29 ENCOUNTER — Encounter: Payer: Self-pay | Admitting: Neurology

## 2021-12-29 ENCOUNTER — Ambulatory Visit: Payer: Medicare HMO | Admitting: Neurology

## 2021-12-29 VITALS — BP 150/65 | HR 85 | Ht 74.0 in | Wt 165.0 lb

## 2021-12-29 DIAGNOSIS — Z8612 Personal history of poliomyelitis: Secondary | ICD-10-CM

## 2021-12-29 DIAGNOSIS — M6250 Muscle wasting and atrophy, not elsewhere classified, unspecified site: Secondary | ICD-10-CM | POA: Diagnosis not present

## 2021-12-29 DIAGNOSIS — R292 Abnormal reflex: Secondary | ICD-10-CM

## 2021-12-29 DIAGNOSIS — R269 Unspecified abnormalities of gait and mobility: Secondary | ICD-10-CM

## 2021-12-29 DIAGNOSIS — R531 Weakness: Secondary | ICD-10-CM | POA: Diagnosis not present

## 2021-12-29 DIAGNOSIS — S00259A Superficial foreign body of unspecified eyelid and periocular area, initial encounter: Secondary | ICD-10-CM

## 2021-12-29 DIAGNOSIS — Z135 Encounter for screening for eye and ear disorders: Secondary | ICD-10-CM | POA: Diagnosis not present

## 2021-12-29 NOTE — Progress Notes (Addendum)
[facility]  PATIENT: Jesse [name] DOB: 12/02/1940  REFERRING DOCTOR OR PCP: Thayer Jew. Pharr MD SOURCE: Patient, notes from PCP,  _________________________________   HISTORICAL  CHIEF COMPLAINT:  Chief Complaint  Patient presents with   New Patient (Initial Visit)    Rm 1, alone. Pt referred for progressive weakness in hands in legs a, hx of Polio. Pt c/o of loss of balance.     HISTORY OF PRESENT ILLNESS:  I had the pleasure of seeing patient, Jesse [name], at [facility] for neurologic consultation regarding his progressive weakness over the last 6+ months.  He is an 81 year old man who had polio at age 73 in 23 while living in [place].   Weakness was in bilateral arms and legs but worse in  the right arm and left leg than the opposie side.   He needed the iron lung and was hospitalized x 9 months.    He felt strength returned to baseline and he was active as a youth playing sports and kept up with everyone else.    He was doing well until recently.Marland Kitchen    His symptoms have progressed over the last 6-8 month and he felt he was completely fine last summer.  He could easily walk a mile.   He had mild residual right hand weakness form the polio but had no left hand weakness at the time.     He had an infection in the knee requiring a hospitalization in mid 2022.  He was unable to walk for a while.   He did 2 months of Rehab and gait improved but to baseline.   However, while in rehab he had noted more issues with weakness in the hands.  He also has lost weight while in Rehab but has only lost a few more pounds since the beginning of the year.   He denies any fasciculations.    He notes a mild change in voice and has noted mild difficulty with swallowing.     Additionally, balance seems worse the last 6-7 months.   He has stumbled but no falls.    He also has noted more urinary urgency the last 6-8 months, concurrent with the other symptoms..   He has  notes some numbness in his hands and feet.    He notes some neck pain and reduced ROM.     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  Some additional labs became available: 08/03/2021: Rheumatoid factor, tPA, C3, C4, ANA and individual antibodies were negative.  B12, TSH were normal.  Vitamin D was mildly low at 23.8. Hemoglobin A1c equals 6.5, CBC and CMP were unremarkable,  REVIEW OF SYSTEMS: Constitutional: No fevers, chills, sweats, or change in appetite Eyes: No visual changes, double vision, eye pain Ear, nose and throat: No hearing loss, ear pain, nasal congestion, sore throat Cardiovascular: No chest pain, palpitations Respiratory:  No shortness of breath at rest or with exertion.   No wheezes GastrointestinaI: No nausea, vomiting, diarrhea, abdominal pain, fecal incontinence Genitourinary:  No dysuria, urinary retention or frequency.  No nocturia. Musculoskeletal:  No neck pain, back pain Integumentary: No rash, pruritus, skin lesions Neurological: as above Psychiatric: No depression at this time.  No anxiety Endocrine: No palpitations, diaphoresis, change in appetite, change in weigh or increased thirst Hematologic/Lymphatic:  No anemia, purpura, petechiae. Allergic/Immunologic: No itchy/runny eyes, nasal congestion, recent allergic reactions, rashes  ALLERGIES: Allergies  Allergen Reactions   Brilinta [Ticagrelor] Anaphylaxis and Shortness Of Breath    Difficulty breathing     HOME MEDICATIONS:  Current Outpatient Medications:    Biotin 5000 MCG TABS, Take 5,000 mcg by mouth daily., Disp: , Rfl:    Cholecalciferol (VITAMIN D3) 50 MCG (2000 UT) CAPS, Take 1 capsule by mouth daily., Disp: , Rfl:    dorzolamide-timolol (COSOPT) 22.3-6.8 MG/ML ophthalmic solution, Place 1 drop into both eyes 2 (two) times daily., Disp: , Rfl: 10   ezetimibe (ZETIA) 10 MG tablet, TAKE 1 TABLET BY MOUTH EVERY DAY AFTER SUPPER, Disp: 30 tablet, Rfl: 2    hydrocortisone cream 1 %, Apply 1 application topically 2 (two) times daily as needed for itching., Disp: , Rfl:    isosorbide mononitrate (IMDUR) 30 MG 24 hr tablet, Take 1 tablet (30 mg total) by mouth daily., Disp: 100 tablet, Rfl: 3   latanoprost (XALATAN) 0.005 % ophthalmic solution, Place 1 drop into both eyes at bedtime., Disp: , Rfl: 12   losartan (COZAAR) 25 MG tablet, TAKE 1 TABLET BY MOUTH DAILY, Disp: 90 tablet, Rfl: 1   metFORMIN (GLUCOPHAGE-XR) 500 MG 24 hr tablet, Take by mouth. 1 tablet in the morning and 2 tablets in the evening, Disp: , Rfl:    metoprolol tartrate (LOPRESSOR) 25 MG tablet, TAKE 1 TABLET BY MOUTH TWICE DAILY, Disp: 180 tablet, Rfl: 3   nitroGLYCERIN (NITROSTAT) 0.4 MG SL tablet, Place 1 tablet (0.4 mg total) under the tongue every 5 (five) minutes as needed for chest pain., Disp: 25 tablet, Rfl: 3   rosuvastatin (CRESTOR) 20 MG tablet, Take 20 mg by mouth at bedtime., Disp: , Rfl:    TRUE METRIX BLOOD GLUCOSE TEST test strip, 2 (two) times daily., Disp: , Rfl:    vitamin B-12 (CYANOCOBALAMIN) 1000 MCG tablet, Take 1,000 mcg by mouth daily., Disp: , Rfl:    XARELTO 20 MG TABS tablet, TAKE 1 TABLET(20 MG) BY MOUTH DAILY WITH SUPPER, Disp: 30 tablet, Rfl: 3  PAST MEDICAL HISTORY: Past Medical History:  Diagnosis Date   Angina pectoris ([place]) 02/03/2015   CAD (coronary artery disease), native coronary artery 08/30/2018   2007 [place], [place]: LIMA to LAD, Occluded SVG to RCA and OM by angiogram S/P  PCI/orbital atherectomy S/P 2.5x15 Resoluute to prox Cx on 11/29/2017.   Coronary artery disease    High cholesterol    Hypertension    Myocardial infarction [facility]) 2017/2018?   NSTEMI (non-ST elevated myocardial infarction) ([place]) 02/03/2015   Presence of permanent cardiac pacemaker    Type II diabetes mellitus ([place])     PAST SURGICAL HISTORY: Past Surgical History:  Procedure Laterality Date   CARDIAC CATHETERIZATION N/A 02/04/2015   Procedure: Left Heart Cath and  Cors/Grafts Angiography;  Surgeon: Adrian Prows, MD;  Location: [place] [facility];  Service: Cardiovascular;  Laterality: N/A;   CARDIAC CATHETERIZATION N/A 02/04/2015   Procedure: Coronary Stent Intervention;  Surgeon: Adrian Prows, MD;  Location: [place] [facility];  Service: Cardiovascular;  Laterality: N/A;   CORONARY ANGIOGRAPHY N/A 10/18/2017   Procedure: CORONARY ANGIOGRAPHY;  Surgeon: Adrian Prows, MD;  Location: [place] [facility];  Service: Cardiovascular;  Laterality: N/A;   CORONARY ANGIOPLASTY     CORONARY ANGIOPLASTY WITH STENT PLACEMENT     "i've got 5-6 stents total" (08/30/2017)   CORONARY ARTERY BYPASS GRAFT  2007   CABG X3   CORONARY ATHERECTOMY N/A 10/18/2017   Procedure: CORONARY ATHERECTOMY;  Surgeon: Adrian Prows, MD;  Location: [facility]  INVASIVE [facility] LAB;  Service: Cardiovascular;  Laterality: N/A;   CORONARY ATHERECTOMY N/A 11/29/2017   Procedure: CORONARY ATHERECTOMY;  Surgeon: Adrian Prows, MD;  Location: [place] [facility];  Service: Cardiovascular;  Laterality: N/A;   CORONARY CTO INTERVENTION N/A 08/30/2017   Procedure: CORONARY CTO INTERVENTION;  Surgeon: Adrian Prows, MD;  Location: [place] [facility];  Service: Cardiovascular;  Laterality: N/A;   CORONARY STENT INTERVENTION N/A 11/29/2017   Procedure: CORONARY STENT INTERVENTION;  Surgeon: Adrian Prows, MD;  Location: [place] [facility];  Service: Cardiovascular;  Laterality: N/A;   INSERT / REPLACE / REMOVE PACEMAKER  ?2012   "I think it was placed in 2012"   KNEE ARTHROSCOPY Left 04/03/2020   Procedure: LEFT KNEE ARTHROSCOPY  WASHOUT;  Surgeon: Tania Ade, MD;  Location: [facility];  Service: Orthopedics;  Laterality: Left;   LEFT HEART CATH AND CORONARY ANGIOGRAPHY N/A 08/30/2017   Procedure: LEFT HEART CATH AND CORONARY ANGIOGRAPHY;  Surgeon: Adrian Prows, MD;  Location: [place] [facility];  Service: Cardiovascular;  Laterality: N/A;   LEFT HEART CATH AND CORS/GRAFTS ANGIOGRAPHY N/A 08/05/2017   Procedure: LEFT HEART CATH AND  CORS/GRAFTS ANGIOGRAPHY;  Surgeon: Adrian Prows, MD;  Location: [place] [facility];  Service: Cardiovascular;  Laterality: N/A;   LEFT HEART CATH AND CORS/GRAFTS ANGIOGRAPHY N/A 06/30/2021   Procedure: LEFT HEART CATH AND CORS/GRAFTS ANGIOGRAPHY;  Surgeon: Adrian Prows, MD;  Location: [place] [facility];  Service: Cardiovascular;  Laterality: N/A;   PPM GENERATOR CHANGEOUT N/A 06/17/2021   Procedure: PPM GENERATOR CHANGEOUT;  Surgeon: Evans Lance, MD;  Location: [place] [facility];  Service: Cardiovascular;  Laterality: N/A;    FAMILY HISTORY: Family History  Problem Relation Age of Onset   Lung cancer Mother    Stroke Father    Febrile seizures Brother     SOCIAL HISTORY:  Social History   Socioeconomic History   Marital status: Significant Other    Spouse name: Copy   Number of children: 0   Years of education: Not on file   Highest education level: High school graduate  Occupational History   Not on file  Tobacco Use   Smoking status: Former    Packs/day: 0.50    Years: 3.00    Pack years: 1.50    Types: Cigarettes    Quit date: 1960    Years since quitting: 63.4   Smokeless tobacco: Never  Vaping Use   Vaping Use: Never used  Substance and Sexual Activity   Alcohol use: No   Drug use: No   Sexual activity: Yes  Other Topics Concern   Not on file  Social History Narrative   Lives spouse   Ambidextrous   Caffeine: 2 C of coffee a day   Social Determinants of Radio broadcast assistant Strain: Not on file  Food Insecurity: Not on file  Transportation Needs: Not on file  Physical Activity: Not on file  Stress: Not on file  Social Connections: Not on file  Intimate Partner Violence: Not on file     PHYSICAL EXAM  Vitals:   12/29/21 1414  BP: (!) 150/65  Pulse: 85  Weight: 165 lb (74.8 kg)  Height: '6\' 2"'$  (1.88 m)    Body mass index is 21.18 kg/m.   General: The patient is well-developed and well-nourished and in no acute  distress  HEENT:  Head is [place]/AT.  Sclera are anicteric.   Neck: No carotid bruits are noted.  The  neck is nontender.  Cardiovascular: The heart has a regular rate and rhythm with a normal S1 and S2. There were no murmurs, gallops or rubs.    Skin: Extremities are without rash or  edema.  Musculoskeletal:  Back is nontender  Neurologic Exam  Mental status: The patient is alert and oriented x 3 at the time of the examination. The patient has apparent normal recent and remote memory, with an apparently normal attention span and concentration ability.   Speech is normal.  Cranial nerves: Extraocular movements are full. Pupils are equal, round, and reactive to light and accomodation.  Visual fields are full.  Facial symmetry is present. There is good facial sensation to soft touch bilaterally.Facial strength is normal.  Trapezius and sternocleidomastoid strength is normal. No dysarthria is noted.  The tongue is midline, and the patient has symmetric elevation of the soft palate. No obvious hearing deficits are noted.  Motor: He has muscle atrophy in the intrinsic hand muscles.  Muscle tone was normal.  Strength was (right/left): Intrinsic hand muscles 3/4 -, deltoid 4/4, triceps 2/2+, biceps 4+/4, iliopsoas 4 -/4, EHL 4+/4+  Sensory: Sensory testing is intact to pinprick, soft touch and vibration sensation in all 4 extremities.  Coordination: Cerebellar testing reveals good finger-nose-finger and heel-to-shin bilaterally.  Gait and station: Station is normal.   Gait is mildly wide.  He can turn 180 degrees in 4 steps.  Romberg is negative.   Reflexes: Deep tendon reflexes are 2+ at the biceps, increase at the knees with spread on the left.  2 at the ankles..   Plantar responses are flexor.     DIAGNOSTIC DATA (LABS, IMAGING, TESTING) - I reviewed patient records, labs, notes, testing and imaging myself where available.  Lab Results  Component Value Date   WBC 6.7 06/15/2021   HGB 13.3  06/15/2021   HCT 40.4 06/15/2021   MCV 99 (H) 06/15/2021   PLT 145 (L) 06/15/2021      Component Value Date/Time   NA 143 06/15/2021 1010   K 4.6 06/15/2021 1010   CL 106 06/15/2021 1010   CO2 21 06/15/2021 1010   GLUCOSE 169 (H) 06/15/2021 1010   GLUCOSE 125 (H) 04/07/2020 0511   BUN 18 06/15/2021 1010   CREATININE 1.26 06/15/2021 1010   CALCIUM 9.7 06/15/2021 1010   PROT 5.5 (L) 04/06/2020 0611   ALBUMIN 2.2 (L) 04/07/2020 0511   AST 22 04/06/2020 0611   ALT 30 04/06/2020 0611   ALKPHOS 94 04/06/2020 0611   BILITOT 1.1 04/06/2020 0611   GFRNONAA >60 04/07/2020 0511   GFRAA >60 04/07/2020 0511   Lab Results  Component Value Date   CHOL 108 02/05/2015   HDL 21 (L) 02/05/2015   LDLCALC 42 02/05/2015   TRIG 226 (H) 02/05/2015   CHOLHDL 5.1 02/05/2015   Lab Results  Component Value Date   HGBA1C 6.4 (H) 04/02/2020   No results found for: VITAMINB12 No results found for: TSH     ASSESSMENT AND PLAN  Weakness - Plan: CK, GM1 IgG Autoantibodies, Anti-Hu, Anti-Ri, Anti-Yo IFA, Vitamin B12, NCV with EMG(electromyography), MR CERVICAL SPINE WO CONTRAST  Muscular atrophy, unspecified site - Plan: CK, GM1 IgG Autoantibodies, Anti-Hu, Anti-Ri, Anti-Yo IFA, Vitamin B12, NCV with EMG(electromyography), MR CERVICAL SPINE WO CONTRAST  History of poliomyelitis - Plan: CK, GM1 IgG Autoantibodies, NCV with EMG(electromyography)  Metal foreign body in eye region - Plan: DG Eye Foreign Body  Gait disturbance  Hyperreflexia - Plan: MR CERVICAL SPINE WO  CONTRAST   In summary, Jesse [name] is an 81 year old man with a history of polio at age 43 who had residual mild right hand and left leg weakness.  He has had progressive weakness and atrophy in the hand muscles since late 2022.   His case is more difficult due to his history of polio.  I am most concerned about the possibility of cervical compressive myelopathy as he did have increased reflexes in his legs.  Because of the muscle  weakness and atrophy, we need to also be concerned about ALS.  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  Additionally I discussed his case with Dr. Krista Blue and she will do a NCV/EMG study to further evaluate.  I will check lab work for anti-GM1, CK and paraneoplastic antibodies.  Based on the results of the studies we will schedule follow-up evaluation or referral for intervention.  He should call sooner if new or worsening neurologic symptoms.  Thank you for asking me to see Jesse [name].  Please let me know if I can be of further assistance with him or other patients in the future     Vassie Kugel A. Felecia Shelling, MD, [facility] 0/01/3709, 6:26 PM Certified in Neurology, Clinical Neurophysiology, Sleep Medicine and Neuroimaging  [facility] [address], [place], [place] [address] [phone]

## 2021-12-30 ENCOUNTER — Telehealth: Payer: Self-pay | Admitting: Neurology

## 2021-12-30 NOTE — Telephone Encounter (Signed)
[employer]are Jesse [name]: [phone] exp. 12/30/21-01/29/22 sent to GI they will call the patient to schedule

## 2022-01-05 NOTE — Telephone Encounter (Signed)
Patient has a pacemaker so he needs to be scheduled at the hospital. I submitted a location change with [facility] Task [id number]

## 2022-01-06 LAB — ANTI-HU, ANTI-RI, ANTI-YO IFA
Anti-Hu Ab: NEGATIVE
Anti-Ri Ab: NEGATIVE
Anti-Yo Ab: NEGATIVE

## 2022-01-06 LAB — GM1 IGG AUTOANTIBODIES: GM1 IgG Autoantibodies: <20 % (ref 0–30)

## 2022-01-06 LAB — INTERPRETATION

## 2022-01-06 LAB — CK: Total CK: 150 U/L (ref 30–208)

## 2022-01-06 LAB — VITAMIN B12: Vitamin B-12: 1213 pg/mL (ref 232–1245)

## 2022-01-11 ENCOUNTER — Telehealth: Payer: Self-pay | Admitting: Neurology

## 2022-01-11 NOTE — Telephone Encounter (Signed)
Rescheduled 7/19 ncv/emg with pt over the phone- MD out.

## 2022-01-11 NOTE — Telephone Encounter (Signed)
I changed the location with [employer] to [facility] and sent it to their workqueue

## 2022-01-19 ENCOUNTER — Ambulatory Visit: Payer: Medicare HMO | Admitting: Neurology

## 2022-01-20 ENCOUNTER — Encounter: Payer: Medicare HMO | Admitting: Neurology

## 2022-01-20 ENCOUNTER — Ambulatory Visit: Payer: Medicare HMO | Admitting: Neurology

## 2022-01-20 ENCOUNTER — Ambulatory Visit (INDEPENDENT_AMBULATORY_CARE_PROVIDER_SITE_OTHER): Payer: Medicare HMO | Admitting: Neurology

## 2022-01-20 ENCOUNTER — Encounter: Payer: Self-pay | Admitting: Neurology

## 2022-01-20 VITALS — BP 132/66 | HR 77 | Ht 74.0 in | Wt 165.0 lb

## 2022-01-20 DIAGNOSIS — G14 Postpolio syndrome: Secondary | ICD-10-CM

## 2022-01-20 DIAGNOSIS — R531 Weakness: Secondary | ICD-10-CM | POA: Diagnosis not present

## 2022-01-20 DIAGNOSIS — M6250 Muscle wasting and atrophy, not elsewhere classified, unspecified site: Secondary | ICD-10-CM

## 2022-01-20 DIAGNOSIS — Z8612 Personal history of poliomyelitis: Secondary | ICD-10-CM

## 2022-01-20 NOTE — Procedures (Signed)
Full Name: Jesse [name] Gender: Male MRN #: [id number] Date of Birth: 18-Jun-1941    Visit Date: 01/20/2022 16:20 Age: 81 Years Examining Physician: Marcial Pacas Referring Physician: Arlice Colt Height: 6 feet 2 inch History: 81 year old male, with history of polio at age 13, was treated at [place], stay in hospital for 9 months, also was put in iron lung, he had residual right hand, left arm weakness, but did not notice significant gait difficulty, he suffered severe left knee infection in 2021, since then, he noticed great decline, worsening bilateral upper and lower extremity weakness, gait abnormality, he denies sensory changes, denies double vision, swallowing difficulty  on examination: mild dysarthria, no tongue atrophy, slight eye closure, cheek puff weakness, slight neck flexion weakness, significant atrophy of bilateral hand and forearm muscles, R/L, shoulder abduction 4/3, biceps 4/3+, triceps 4/4-, wrist extension 5-/5-, wrist flexion 4/4+, grip 3/4, hip flexion 4/4, knee extension 5/5, knee flexion5/5, ankle dorsiflexion 5-/5-, toe extension 4/4, deformity of right foot, atrophy of right calf muscle, limited range of motion of bilateral ankle, right worse than left  Brisk bilateral upper extremity and patellar reflex, absent ankle reflex, plantar responses were extensor bilaterally.  Summary of the test: Right sural, superficial peroneal sensory responses showed moderately decreased snap amplitude.  Right median, ulnar sensory responses showed robust snap amplitude, with mildly prolonged peak latency, this can be related to his cold limb temperature.  Right ulnar, median motor responses showed significantly decreased CMAP amplitude.  Right tibial motor response showed normal distal latency, significantly decreased CMAP amplitude.  Electromyography: Selected needle examination was performed at bilateral lower extremity muscles, bilateral lumbosacral paraspinal muscles; right  upper extremity muscles, right cervical paraspinal muscles; right mid and lower thoracic paraspinal; right sternocleidomastoid, and right orbicularis oculi.  There is clearly chronic neuropathic changes involving bilateral lumbosacral, right cervical myotomes, only mild spontaneous activity in comparison with significant muscle atrophy and remodeling process.  There is also evidence of increased insertional activity, complex and large motor unit potential at the right mid and lower thoracic paraspinal muscles.  There is minimally increased insertional activity at right cervical, lumbosacral paraspinal muscles, motor unit potential were complex, enlarged.  There is also evidence of mild chronic neuropathic changes noted at right sternocleidomastoid, even at right orbicularis oculi,   Conclusion: This is an abnormal study.  There is widespread chronic neuropathic changes involving bilateral lumbosacral myotomes, right cervical myotomes, also evidence of chronic remodeling process involving right middle and lower thoracic paraspinal muscles, right sternocleidomastoid, milder degree involving orbicularis oculi.  But the relative sparse active denervation noted on the needle examination and rubberish feeling when the needle penetrating the muscles suggest a long chronic process.  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    ------------------------------- Marcial Pacas M.D. Ph.D.  [facility] [address] [place], [place] [address] Tel: [phone] Fax: [phone]  Verbal informed consent was obtained from the patient, patient was informed of potential risk of procedure, including bruising, bleeding, hematoma formation, infection, muscle weakness, muscle pain, numbness, among others.        [place]    Nerve / Sites Muscle Latency Ref. Amplitude Ref. Rel Amp Segments Distance Velocity Ref. Area    ms ms mV mV %  cm m/s m/s mVms  R  Median - APB     Wrist APB 5.5 ?4.4 0.9 ?4.0 100 Wrist - APB 7   1.7     Upper  arm APB 10.9  1.0  111 Upper arm - Wrist 27 50 ?49 2.1  R Ulnar - ADM     Wrist ADM 4.5 ?3.3 0.3 ?6.0 100 Wrist - ADM 7   0.6     B.Elbow ADM 7.8  0.3  98.6 B.Elbow - Wrist 16.5 50 ?49 0.5     A.Elbow ADM 11.0  0.4  121 A.Elbow - B.Elbow 15 48 ?49 0.7  R Tibial - AH     Ankle AH 5.3 ?5.8 0.3 ?4.0 100 Ankle - AH 9   0.5     Pop fossa AH 17.6  0.2  63 Pop fossa - Ankle   ?41 0.1           SNC    Nerve / Sites Rec. Site Peak Lat Ref.  Amp Ref. Segments Distance    ms ms V V  cm  R Sural - Ankle (Calf)     Calf Ankle 4.6 ?4.4 3 ?6 Calf - Ankle 14  R Superficial peroneal - Ankle     Lat leg Ankle 3.7 ?4.4 2 ?6 Lat leg - Ankle 14  R Median - Orthodromic (Dig II, Mid palm)     Mid palm Wrist 3.3 ?2.3 16 ?35 Mid palm - Wrist 8  R Ulnar - Orthodromic, (Dig V, Mid palm)     Dig V Wrist 3.4 ?3.1 12 ?5 Dig V - Wrist 51             F  Wave    Nerve F Lat Ref.   ms ms  R Ulnar - ADM 35.4 ?32.0  R Tibial - AH  ?56.0         EMG Summary Table    Spontaneous MUAP Recruitment  Muscle IA Fib PSW Fasc Other Amp Dur. Poly Pattern  R. Tibialis anterior Increased None None None _______ Increased Increased 1+ Reduced  R. Tibialis posterior Increased None None None _______ Increased Increased 1+ Reduced  R. Peroneus longus Increased None None None _______ Increased Increased 1+ Reduced  R. Vastus lateralis Increased None None None _______ Increased Increased 1+ Reduced  R. Lumbar paraspinals (low) Normal None None None _______ Increased Increased 1+ Reduced  R. Lumbar paraspinals (mid) Normal None None None _______ Increased Increased 1+ Reduced  R. First dorsal interosseous Increased None None None _______ Increased Increased 1+ Reduced  R. Pronator teres Normal None None None _______ Increased Increased 1+ Reduced  R. Biceps brachii Increased None None None _______ Increased Increased 1+ Reduced  R. Deltoid Increased  None None None _______ Increased Increased 1+ Reduced  R. Triceps brachii Increased None None None _______ Increased Increased 1+ Reduced  R. Cervical paraspinals Normal None None None _______ Increased Increased 1+ Reduced  R. Sternocleidomastoid Normal None None None _______ Increased Increased 1+ Reduced  R. Orbicularis oculi Normal None None None _______ Increased Increased 1+ Reduced  R. Thoracic paraspinals (low) Normal None None None _______ Increased Increased 1+ Reduced  R. Thoracic paraspinals (mid) Normal None None None _______ Increased Increased 1+ Reduced  L. Tibialis anterior Increased None None None _______ Increased Increased 1+ Reduced  L. Peroneus longus Normal None None None _______ Increased Increased 1+ Reduced  L. Vastus lateralis Normal None None None _______ Increased Increased 1+ Reduced  L. Gastrocnemius (Medial head) Normal None None None _______ Increased Increased 1+ Reduced  L. Lumbar paraspinals (low) Normal None None None _______ Normal Normal Normal Reduced  L. Lumbar paraspinals (mid) Normal None None  None _______ Increased Increased 1+ Reduced

## 2022-01-21 ENCOUNTER — Encounter: Payer: Self-pay | Admitting: Neurology

## 2022-01-21 ENCOUNTER — Telehealth: Payer: Self-pay | Admitting: Neurology

## 2022-01-21 NOTE — Telephone Encounter (Signed)
Called patient to discuss nerve conduction study results. No answer at this time. LVM for the patient to call back.  Will send a mychart message as well

## 2022-01-21 NOTE — Telephone Encounter (Signed)
Pt returned call. I was able to review the results with him. Pt states he has not been contacted in regards to being scheduled for MRI. Informed the pt I will make sure our mri team is aware and have the follow up. Pt verbalized understanding and was appreciative. Pt had no questions at this time but was encouraged to call back if questions arise.

## 2022-01-21 NOTE — Telephone Encounter (Signed)
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  It is still possible that degenerative changes in the cervical spine could contribute to some of his symptoms.  I had ordered an MRI of the cervical spine but it does not look like it has been done.  If he is interested in pursuing this, could you recheck with the imaging schedulers.  Thank you

## 2022-01-21 NOTE — Telephone Encounter (Signed)
I sent another request by fax to the [facility] MRI scheduling to call the patient to schedule.

## 2022-01-22 DIAGNOSIS — G14 Postpolio syndrome: Secondary | ICD-10-CM | POA: Insufficient documentation

## 2022-01-22 NOTE — Progress Notes (Signed)
EMG is under procedure tab 

## 2022-02-10 ENCOUNTER — Encounter: Payer: Medicare HMO | Admitting: Neurology

## 2022-02-26 ENCOUNTER — Telehealth: Payer: Self-pay | Admitting: Cardiology

## 2022-02-26 NOTE — Telephone Encounter (Signed)
[place] w/[facility] calling about getting a clearance for this patient. He is planned to have extractions done, as well as implants. Dr. Gentry Fitz is to be performing these for this patient. Once completed, we will inform the patient/office.

## 2022-02-27 NOTE — Telephone Encounter (Signed)
No antibiotic prophylaxis needed. CAN PROCEED

## 2022-03-01 ENCOUNTER — Telehealth: Payer: Self-pay | Admitting: Cardiology

## 2022-03-01 NOTE — Telephone Encounter (Signed)
Chief Complaint  Patient presents with   Medical Clearance    Surgical clearance for dental work    Solmon Ice, please let the dentist know I have no idea what they want from me if they do not specify what they are doing. From heart stand point, he is low risk and as long as they do not have to stop medications, they can do what they need to do. No antibiotic prophylaxis needed.   Adrian Prows, MD, [facility] 03/01/2022, 12:13 PM Office: [phone] Fax: [phone] Pager: [phone]

## 2022-03-12 ENCOUNTER — Telehealth: Payer: Self-pay | Admitting: *Deleted

## 2022-03-12 NOTE — Patient Outreach (Signed)
  Care Coordination   Initial Visit Note   03/12/2022 Name: Connar Keating MRN: [id number] DOB: 1941/02/15  Bayron Dalto is a 81 y.o. year old male who sees Deland Pretty, MD for primary care. I spoke with  [name] and daughter, [name], by phone today  What matters to the patients health and wellness today?  Scheduled for Care Coordination Intake visit on 03/17/22    Goals Addressed   None     SDOH assessments and interventions completed:  No     Care Coordination Interventions Activated:  No  Care Coordination Interventions:  No, not indicated   Follow up plan: Follow up call scheduled for 03/17/22    Encounter Outcome:  Pt. Visit Completed   Eduard Clos MSW, LCSW Licensed Clinical Social Worker      [phone]

## 2022-03-15 DIAGNOSIS — R69 Illness, unspecified: Secondary | ICD-10-CM | POA: Diagnosis not present

## 2022-03-17 ENCOUNTER — Ambulatory Visit: Payer: Self-pay | Admitting: *Deleted

## 2022-03-17 NOTE — Patient Outreach (Addendum)
  Care Coordination   Initial Visit Note   03/17/2022 Name: Sudeep Scheibel MRN: [id number] DOB: 07/29/1940  Connar Keating is a 81 y.o. year old male who sees Deland Pretty, MD for primary care. I spoke with  [name] and his HCPOA/daughter, Roena Malady, by phone today  What matters to the patients health and wellness today?  Transportation needs.   Pt living with his 90yo wife and their dog, Charlie. Pt is fairly independent however is dealing with post-polio syndrome.  They are planning for MRI and then to see a Neurologist.  Daughter is a Marine scientist and involved with care/health care, transportation,etc.   Goals Addressed             This Visit's Progress    Transportation and other resources       Care Coordination Interventions:  Depression screen reviewed  Solution-Focused Strategies employed:  Mindfulness or Relaxation training provided Problem [place] strategies reviewed Discussed caregiver resources and support: SCAT application to be mailed to you Review [employer] portal and benefits you have through your policy         SDOH assessments and interventions completed:  Yes  SDOH Interventions Today    Flowsheet Row Most Recent Value  SDOH Interventions   Physical Activity Interventions Intervention Not Indicated        Care Coordination Interventions Activated:  Yes  Care Coordination Interventions:  Yes, provided   Follow up plan: Follow up call scheduled for 04/08/22    Encounter Outcome:  Pt. Visit Completed   Eduard Clos MSW, LCSW Licensed Clinical Social Worker      [phone]

## 2022-03-17 NOTE — Patient Instructions (Signed)
Visit Information  Thank you for taking time to visit with me today. Please don't hesitate to contact me if I can be of assistance to you.   Following are the goals we discussed today:   Goals Addressed             This Visit's Progress    Transportation and other resources       Care Coordination Interventions:  Depression screen reviewed  Solution-Focused Strategies employed:  Mindfulness or Relaxation training provided Problem [place] strategies reviewed Discussed caregiver resources and support: SCAT application to be mailed to you Review [employer] portal and benefits you have through your policy         Our next appointment is by telephone on 04/08/22 at 2pm  Please call the care guide team at [phone] if you need to cancel or reschedule your appointment.   If you are experiencing a Mental Health or [place] or need someone to talk to, please call the [place] National Suicide Prevention Lifeline: 626-683-3449 or TTY: 404-515-6565 TTY [phone]) to talk to a trained counselor call 911   Patient verbalizes understanding of instructions and care plan provided today and agrees to view in [place]. Active MyChart status and patient understanding of how to access instructions and care plan via MyChart confirmed with patient.     Telephone follow up appointment with care management team member scheduled for:04/08/22  Eduard Clos MSW, LCSW Licensed Clinical Social Worker      [phone]

## 2022-03-19 ENCOUNTER — Ambulatory Visit (HOSPITAL_COMMUNITY)
Admission: RE | Admit: 2022-03-19 | Discharge: 2022-03-19 | Disposition: A | Discharge status: Home / Self Care | Payer: Medicare HMO | Source: Ambulatory Visit | Attending: Neurology | Admitting: Neurology

## 2022-03-19 ENCOUNTER — Ambulatory Visit (HOSPITAL_COMMUNITY): Admission: RE | Admit: 2022-03-19 | Payer: Medicare HMO | Source: Ambulatory Visit

## 2022-03-19 DIAGNOSIS — R531 Weakness: Secondary | ICD-10-CM | POA: Insufficient documentation

## 2022-03-19 DIAGNOSIS — R292 Abnormal reflex: Secondary | ICD-10-CM | POA: Diagnosis not present

## 2022-03-19 DIAGNOSIS — M4802 Spinal stenosis, cervical region: Secondary | ICD-10-CM | POA: Diagnosis not present

## 2022-03-19 DIAGNOSIS — M6250 Muscle wasting and atrophy, not elsewhere classified, unspecified site: Secondary | ICD-10-CM | POA: Insufficient documentation

## 2022-03-19 DIAGNOSIS — M47812 Spondylosis without myelopathy or radiculopathy, cervical region: Secondary | ICD-10-CM | POA: Diagnosis not present

## 2022-03-19 DIAGNOSIS — M4312 Spondylolisthesis, cervical region: Secondary | ICD-10-CM | POA: Diagnosis not present

## 2022-03-19 NOTE — Progress Notes (Signed)
Per order, Changed device settings for MRI to DOO at 15 bpm above current HR.     Tachy-therapies to off if applicable.   Will program device back to pre-MRI settings after completion of exam.

## 2022-03-19 NOTE — Progress Notes (Signed)
Informed of MRI for today.   Device system confirmed to be MRI conditional, with implant date > 6 weeks ago, and no evidence of abandoned or epicardial leads in review of most recent CXR Interrogation from today reviewed, pt is currently AS-VP at ~ 80 bpm  Change device settings for MRI to DOO at 15 bpm above current HR.    Tachy-therapies to off if applicable.  Program device back to pre-MRI settings after completion of exam.  Annamaria Helling  03/19/2022 12:50 PM

## 2022-03-22 ENCOUNTER — Telehealth: Payer: Self-pay | Admitting: Neurology

## 2022-03-22 DIAGNOSIS — M4802 Spinal stenosis, cervical region: Secondary | ICD-10-CM

## 2022-03-22 DIAGNOSIS — G959 Disease of spinal cord, unspecified: Secondary | ICD-10-CM

## 2022-03-22 NOTE — Telephone Encounter (Signed)
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  On the sagittal images, there appears to be increased signal within the spinal cord at this level consistent with myelopathy.  I discussed this with him.  I recommend that we have him see neurosurgery about surgery to help reduce the pressure on the spinal cord and prevent further damage over time.  He would like to proceed.

## 2022-03-23 ENCOUNTER — Telehealth: Payer: Self-pay | Admitting: Neurology

## 2022-03-23 DIAGNOSIS — I252 Old myocardial infarction: Secondary | ICD-10-CM | POA: Diagnosis not present

## 2022-03-23 DIAGNOSIS — E118 Type 2 diabetes mellitus with unspecified complications: Secondary | ICD-10-CM | POA: Diagnosis not present

## 2022-03-23 DIAGNOSIS — E559 Vitamin D deficiency, unspecified: Secondary | ICD-10-CM | POA: Diagnosis not present

## 2022-03-23 DIAGNOSIS — E1169 Type 2 diabetes mellitus with other specified complication: Secondary | ICD-10-CM | POA: Diagnosis not present

## 2022-03-23 DIAGNOSIS — I10 Essential (primary) hypertension: Secondary | ICD-10-CM | POA: Diagnosis not present

## 2022-03-23 DIAGNOSIS — E782 Mixed hyperlipidemia: Secondary | ICD-10-CM | POA: Diagnosis not present

## 2022-03-23 NOTE — Telephone Encounter (Signed)
Sent to [facility] ph # [phone]

## 2022-03-25 ENCOUNTER — Ambulatory Visit (HOSPITAL_COMMUNITY): Payer: Medicare HMO

## 2022-03-26 ENCOUNTER — Ambulatory Visit: Payer: Medicare HMO | Admitting: Cardiology

## 2022-03-26 ENCOUNTER — Encounter: Payer: Self-pay | Admitting: Cardiology

## 2022-03-26 VITALS — BP 126/64 | HR 96 | Temp 98.6°F | Resp 16 | Ht 74.0 in | Wt 160.2 lb

## 2022-03-26 DIAGNOSIS — I25118 Atherosclerotic heart disease of native coronary artery with other forms of angina pectoris: Secondary | ICD-10-CM

## 2022-03-26 DIAGNOSIS — E782 Mixed hyperlipidemia: Secondary | ICD-10-CM

## 2022-03-26 DIAGNOSIS — Z950 Presence of cardiac pacemaker: Secondary | ICD-10-CM

## 2022-03-26 DIAGNOSIS — I495 Sick sinus syndrome: Secondary | ICD-10-CM

## 2022-03-26 DIAGNOSIS — I480 Paroxysmal atrial fibrillation: Secondary | ICD-10-CM

## 2022-03-26 DIAGNOSIS — Z006 Encounter for examination for normal comparison and control in clinical research program: Secondary | ICD-10-CM | POA: Insufficient documentation

## 2022-03-26 NOTE — Patient Instructions (Signed)
Please do not forget to get blood work done at the end of October 2023 or the beginning of November 2023, blood orders have been placed.

## 2022-03-26 NOTE — Progress Notes (Signed)
Primary Physician/Referring:  Deland Pretty, MD  Patient ID: Jesse [name], male    DOB: May 11, 1941, 81 y.o.   MRN: [id number]  Chief Complaint  Patient presents with  . Coronary Artery Disease  . Shortness of Breath  . Heart block  . Follow-up    6 months   HPI:    Jesse [name]  is a 81 y.o. male  with coronary artery disease and CABG in 2007 in [place] with LIMA to LAD, SVG to OM which is occluded by angiography on 02/04/2015, occluded SVG to RCA. He underwent complex successful arthrectomy and PCI to Cx on 11/29/2017.  His past medical history also includes sick sinus syndrome status post Medtronic pacemaker implantation 2011, generator change out 06/17/2021, PAF, diabetes mellitus, hypertension and hyperlipidemia.  Due to dyspnea on exertion, he underwent coronary angiography on 11/28/2020 revealing no change from 2019 and echocardiogram revealing improved diastolic function.    He is presently doing well, except he has lost about 10 pounds in weight as he is edentulous as he had his teeth extracted recently and has lost about 10 pounds in weight.  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   Past Medical History:  Diagnosis Date  . 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 CAD (coronary artery disease), native coronary artery 08/30/2018   2007 [place], [place]: LIMA to LAD, Occluded SVG to RCA and OM by angiogram S/P  PCI/orbital atherectomy S/P 2.5x15 Resoluute to prox Cx on 11/29/2017.  Marland Kitchen Coronary artery disease   . High cholesterol   . Hypertension   . Myocardial infarction [facility]) 2017/2018?  . NSTEMI (non-ST elevated myocardial infarction) ([place]) 02/03/2015  . Presence of permanent cardiac pacemaker   . Type II diabetes mellitus (HCC)    Social History   Tobacco Use  .  Smoking status: Former    Packs/day: 0.50    Years: 3.00    Total pack years: 1.50    Types: Cigarettes    Quit date: 1960    Years since quitting: 63.7  . Smokeless tobacco: Never  Substance Use Topics  . Alcohol use: No   Marital Status: Widowed ROS  Review of Systems  Cardiovascular:  Positive for dyspnea on exertion. Negative for chest pain and leg swelling.   Objective  Blood pressure 126/64, pulse 96, temperature 98.6 F (37 C), temperature source Temporal, resp. rate 16, height '6\' 2"'  (1.88 m), weight 160 lb 3.2 oz (72.7 kg), SpO2 100 %.     03/26/2022    8:29 AM 01/20/2022    3:39 PM 12/29/2021    2:14 PM  Vitals with BMI  Height '6\' 2"'  '6\' 2"'  '6\' 2"'   Weight 160 lbs 3 oz 165 lbs 165 lbs  BMI 20.56 27.25 36.64  Systolic 403 474 259  Diastolic 64 66 65  Pulse 96 77 85     Physical Exam Vitals reviewed.  Constitutional:      Appearance: He is well-developed.  Neck:     Vascular: No carotid bruit or JVD.  Cardiovascular:     Rate and Rhythm: Normal rate and regular rhythm.     Pulses: Intact distal pulses.          Popliteal pulses are 2+ on the right side and 2+ on the left side.  Dorsalis pedis pulses are 2+ on the right side and 1+ on the left side.       Posterior tibial pulses are 0 on the right side and 0 on the left side.     Heart sounds: Heart sounds are distant. No murmur heard.    Early systolic murmur is present at the upper right sternal border.     No gallop.  Pulmonary:     Effort: Pulmonary effort is normal.     Breath sounds: Normal breath sounds.  Abdominal:     General: Bowel sounds are normal.     Palpations: Abdomen is soft.  Musculoskeletal:     Right lower leg: No edema.     Left lower leg: No edema.   Laboratory examination:   External labs:  Labs 12/10/2021:  Hb 13.2/HCT 38.3, platelets 151.  Normal indicis.  Sodium 140, potassium 4.7, BUN 21, creatinine 1.15, EGFR 59 mL, LFTs normal.  Urine analysis normal.  A1C 6.5, TSH  3.090  Total cholesterol 198, triglycerides 160, HDL 38, LDL 128.   12/03/2020:  A1c 6.7%. Total cholesterol 95, triglycerides 108, HDL 40, LDL 33. Hb 12.7/HCT 37.4, platelets 142.  Normal indicis. Serum glucose 144, BUN 21, creatinine 1.02, EGFR 69 mL, potassium 4.4.  CMP normal.  Allergies   Allergies  Allergen Reactions  . Brilinta [Ticagrelor] Anaphylaxis and Shortness Of Breath    Difficulty breathing      Final Medications at End of Visit     Current Outpatient Medications:  .  Biotin 5000 MCG TABS, Take 5,000 mcg by mouth daily., Disp: , Rfl:  .  Cholecalciferol (VITAMIN D3) 50 MCG (2000 UT) CAPS, Take 1 capsule by mouth daily., Disp: , Rfl:  .  dorzolamide-timolol (COSOPT) 22.3-6.8 MG/ML ophthalmic solution, Place 1 drop into both eyes 2 (two) times daily., Disp: , Rfl: 10 .  ezetimibe (ZETIA) 10 MG tablet, TAKE 1 TABLET BY MOUTH EVERY DAY AFTER SUPPER, Disp: 30 tablet, Rfl: 2 .  hydrocortisone cream 1 %, Apply 1 application topically 2 (two) times daily as needed for itching., Disp: , Rfl:  .  isosorbide mononitrate (IMDUR) 30 MG 24 hr tablet, Take 1 tablet (30 mg total) by mouth daily., Disp: 100 tablet, Rfl: 3 .  latanoprost (XALATAN) 0.005 % ophthalmic solution, Place 1 drop into both eyes at bedtime., Disp: , Rfl: 12 .  losartan (COZAAR) 25 MG tablet, TAKE 1 TABLET BY MOUTH DAILY, Disp: 90 tablet, Rfl: 1 .  metFORMIN (GLUCOPHAGE-XR) 500 MG 24 hr tablet, Take by mouth. 1 tablet in the morning and 2 tablets in the evening, Disp: , Rfl:  .  metoprolol tartrate (LOPRESSOR) 25 MG tablet, TAKE 1 TABLET BY MOUTH TWICE DAILY, Disp: 180 tablet, Rfl: 3 .  nitroGLYCERIN (NITROSTAT) 0.4 MG SL tablet, Place 1 tablet (0.4 mg total) under the tongue every 5 (five) minutes as needed for chest pain., Disp: 25 tablet, Rfl: 3 .  rosuvastatin (CRESTOR) 20 MG tablet, Take 20 mg by mouth at bedtime., Disp: , Rfl:  .  TRUE METRIX BLOOD GLUCOSE TEST test strip, 2 (two) times daily., Disp: ,  Rfl:  .  vitamin B-12 (CYANOCOBALAMIN) 1000 MCG tablet, Take 1,000 mcg by mouth daily., Disp: , Rfl:  .  XARELTO 20 MG TABS tablet, TAKE 1 TABLET(20 MG) BY MOUTH DAILY WITH SUPPER, Disp: 30 tablet, Rfl: 3   Radiology:   CXR 04/02/2020: Left pacer remains in place, unchanged. Heart is normal size. No confluent opacities or effusions. No  acute bony abnormality.  Cardiac Studies:   Lexiscan/modified Bruce Tetrofosmin stress test 02/06/2020: Lexiscan/modified Bruce nuclear stress test performed using 1-day protocol. Stress EKG is non-diagnostic, as this is pharmacological stress test. In addition, stress EKG at 110% MPHR showed AV paced rhythm.  SPECT images show medium sized, mild intensity, partially reversible perfusion defect in apical to basal inferior and basal inferoseptal myocardium. While septal perfusion defect may be due to paced rhythm, ischemia cannot be excluded. Recommend clinical correlation.  Stress LVEF 44%. High risk study. No significant change from 02/14/2017.   Echocardiogram 06/23/2021:  Normal LV systolic function with visual EF 55-60%. Left ventricle cavity  is normal in size. Moderate left ventricular hypertrophy. Normal global  wall motion. Normal diastolic filling pattern, normal LAP.  Mild to moderate aortic regurgitation.  Mild (Grade I) mitral regurgitation.  Mild tricuspid regurgitation. No evidence of pulmonary hypertension.  Compared to study 15/83/0940 diastolic function improved (indeterminate to normal), moderate TR is now mild, Aortic root was 52m to 377m.   Coronary angiography 06/30/2021: RCA: Long CTO.  He has ipsilateral collaterals as contralateral collaterals from the LAD. LM: Large vessel.  Mildly calcified. LAD: Ostial 40 to 50% stenosis, diffuse.  Continues as a large D1 as the LAD is occluded in the proximal segment.   LIMA to LAD is widely RI: Moderate caliber vessel, ostial 40 to 50% stenosis, calcific. CX: 2.5 x 15 mm resolute Onyx DES  placed 11/29/2017 in the ostium of the CX is widely patent.  CX gives origin to large OM1 and OM 2.  There is mild diffuse disease. LV: Mild inferior hypokinesis, preserved LVEF at 50 to 55%.  No significant MR. Hemodynamics: LV 93/0, EDP 7 mmHg.  Ao 89/41, mean 60 mmHg.  There was no pressure gradient across the aortic valve.  Recommendation: Coronary anatomy is not significantly changed from 2019.  He has small vessel disease, continue medical therapy as indicated.  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  70 mL contrast utilized.  Pacemaker Medtronic dual chamber AZURE XT DR MRI, Gen change 06/17/2021   Remote pacemaker transmission 03/21/2022: AP 5%, VP 97%.  Longevity 11 years and 8 months.  Lead impedance and thresholds are normal.  There are no high rate episodes.  Normal dual-chamber pacemaker function.  EKG   EKG 03/26/2022: Normal sinus rhythm with ventricularly paced rhythm at rate of 88 bpm.  No further analysis.    Assessment     ICD-10-CM   1. Coronary artery disease of native artery of native heart with stable [place]ina pectoris ([place] I25.118 EKG 12-Lead    2. PAF (paroxysmal atrial fibrillation) ([place] CHA2DS2-VASc Score is 5 (A, HTN, DM Vasc Dz). Yearly risk of stroke 6.7%  I48.0     3. Sick sinus syndrome (HCC)  I49.5     4. Pacemaker Medtronic dual chamber AzureT XT DR MRI SureScan Gen change 06/17/21  Z95.0     5. Mixed hyperlipidemia  E78.2 Lipid Panel With LDL/HDL Ratio    LDL cholesterol, direct    6. Enrolled in clinical trial: OCEANIC-AF (Asundexian - factor XIa inhibitor PO BID vs Apixaban PO BID in patients with A. Fib for stroke prevention.  Z00.6       No orders of the defined types were placed in this encounter.    There are no discontinued medications.  Recommendations:   DiRani Sisneyis a [age].o. male  [place]  [place] LIMA to LAD, SVG to OM which is occluded by angiography on  02/04/2015, occluded SVG to RCA. He underwent complex successful arthrectomy and PCI to Cx on 11/29/2017.  His past medical history also includes sick sinus syndrome status post Medtronic pacemaker implantation 2011, generator change out 06/17/2021, PAF, diabetes mellitus, hypertension and hyperlipidemia.  Due to dyspnea on exertion, he underwent coronary angiography on 11/28/2020 revealing no change from 2019 and echocardiogram revealing improved diastolic function.    He is presently doing well, except he has lost about 10 pounds in weight as he is edentulous as he had his teeth extracted recently and has lost about 10 pounds in weight.  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  He is scheduled to see neurosurgery soon and they plan on operating him.  I do not think he is high risk from cardiac standpoint and can be taken up for surgery with low risk.  I reviewed his external labs, renal function is remained stable, CBC has remained stable, no bleeding diathesis on anticoagulation.  Patient is enrolled in OCEANIC-AF (Asundexian - factor XIa inhibitor PO BID vs Apixaban PO BID in patients with A. Fib for stroke prevention.   Also after review of the lipids, his LDL has risen.  I am surprised by this finding as his LDL has been in the low 50s for the last 2 to 3 years since addition of ezetimibe.  Hence I would like to recheck his LDL and also measured direct LDL in about 2 months from now.  Otherwise stable from cardiac standpoint and I will see him back in 6 months for follow-up.  His pacemaker is functioning normally.    Jesse Prows, PA-C 03/26/2022, 9:41 AM Office: [phone]

## 2022-04-05 DIAGNOSIS — M4802 Spinal stenosis, cervical region: Secondary | ICD-10-CM | POA: Diagnosis not present

## 2022-04-05 DIAGNOSIS — G992 Myelopathy in diseases classified elsewhere: Secondary | ICD-10-CM | POA: Diagnosis not present

## 2022-04-07 ENCOUNTER — Other Ambulatory Visit: Payer: Self-pay | Admitting: Neurosurgery

## 2022-04-08 ENCOUNTER — Ambulatory Visit: Payer: Self-pay | Admitting: *Deleted

## 2022-04-08 ENCOUNTER — Other Ambulatory Visit: Payer: Self-pay | Admitting: Neurosurgery

## 2022-04-08 NOTE — Patient Instructions (Signed)
Visit Information  Thank you for taking time to visit with me today. Please don't hesitate to contact me if I can be of assistance to you.   Following are the goals we discussed today:   Goals Addressed             This Visit's Progress    Transportation and other resources       Care Coordination Interventions:  Pt reports MRI showed 2 spurs on spine and will have surgery 04/16/22 at [place] application is still pending PCP documentation- CSW to communicate with PCP office for coordination Discussed plans for [facility] team to assist in hospital with [facility], DME, etc needs Depression screen reviewed  Solution-Focused Strategies employed:  Mindfulness or Relaxation training provided Problem [place] strategies reviewed Discussed caregiver resources and support: SCAT application to be mailed to you Review [employer] portal and benefits you have through your policy         Our next appointment is by telephone on 04/28/22    Please call the care guide team at [phone] if you need to cancel or reschedule your appointment.   If you are experiencing a Mental Health or [place] or need someone to talk to, please call 1-800-273-TALK (toll free, 24 hour hotline) call 911   Patient verbalizes understanding of instructions and care plan provided today and agrees to view in [place]. Active MyChart status and patient understanding of how to access instructions and care plan via MyChart confirmed with patient.     Telephone follow up appointment with care management team member scheduled for:04/28/22  Eduard Clos MSW, LCSW Licensed Clinical Social Worker      [phone]

## 2022-04-08 NOTE — Patient Outreach (Signed)
  Care Coordination   Follow Up Visit Note   04/08/2022 Name: Jesse [name] MRN: [id number] DOB: [birth year]  Jesse [name] is a 81 y.o. year old male who sees Deland Pretty, MD for primary care. I spoke with  [name] and family by phone today.  What matters to the patients health and wellness today?  Plans for spine surgery 04/16/22. Transportation resources pending application.    Goals Addressed             This Visit's Progress    Transportation and other resources       Care Coordination Interventions:  Pt reports MRI showed 2 spurs on spine and will have surgery 04/16/22 at [place] application is still pending PCP documentation- CSW to communicate with PCP office for coordination Discussed plans for [facility] team to assist in hospital with [facility], DME, etc needs Depression screen reviewed  Solution-Focused Strategies employed:  Mindfulness or Relaxation training provided Problem [place] strategies reviewed Discussed caregiver resources and support: SCAT application to be mailed to you Review [employer] portal and benefits you have through your policy         SDOH assessments and interventions completed:  Yes     Care Coordination Interventions Activated:  Yes  Care Coordination Interventions:  Yes, provided   Follow up plan:  04/28/22    Encounter Outcome:  Pt. Visit Completed   Eduard Clos MSW, LCSW Licensed Clinical Social Worker      [phone]

## 2022-04-12 ENCOUNTER — Encounter (HOSPITAL_COMMUNITY): Payer: Self-pay

## 2022-04-12 NOTE — Pre-Procedure Instructions (Signed)
Surgical Instructions    Your procedure is scheduled on April 16, 2022.  Report to [facility] Main Entrance "A" at 5:30 A.M., then check in with the Admitting office.  Call this number if you have problems the morning of surgery:  [phone]   If you have any questions prior to your surgery date call [phone]: Open Monday-Friday 8am-4pm    Remember:  Do not eat or drink after midnight the night before your surgery     Take these medicines the morning of surgery with A SIP OF WATER:  dorzolamide-timolol (COSOPT)  isosorbide mononitrate (IMDUR)  metoprolol tartrate (LOPRESSOR)     Take these medications the morning of surgery AS NEEDED:  nitroGLYCERIN (NITROSTAT)    Follow your surgeon's instructions on when to stop apixaban.  If no instructions were given by your surgeon then you will need to call the office to get those instructions.     As of today, STOP taking any Aspirin (unless otherwise instructed by your surgeon) Aleve, Naproxen, Ibuprofen, Motrin, Advil, Goody's, BC's, all herbal medications, fish oil, and all vitamins.           WHAT DO I DO ABOUT MY DIABETES MEDICATION?   Do not take metFORMIN (GLUCOPHAGE-XR) the morning of surgery.   HOW TO MANAGE YOUR DIABETES BEFORE AND AFTER SURGERY  Why is it important to control my blood sugar before and after surgery? Improving blood sugar levels before and after surgery helps healing and can limit problems. A way of improving blood sugar control is eating a healthy diet by:  Eating less sugar and carbohydrates  Increasing activity/exercise  Talking with your doctor about reaching your blood sugar goals High blood sugars (greater than 180 mg/dL) can raise your risk of infections and slow your recovery, so you will need to focus on controlling your diabetes during the weeks before surgery. Make sure that the doctor who takes care of your diabetes knows about your planned surgery including the date and  location.  How do I manage my blood sugar before surgery? Check your blood sugar at least 4 times a day, starting 2 days before surgery, to make sure that the level is not too high or low.  Check your blood sugar the morning of your surgery when you wake up and every 2 hours until you get to the Short Stay unit.  If your blood sugar is less than 70 mg/dL, you will need to treat for low blood sugar: Do not take insulin. Treat a low blood sugar (less than 70 mg/dL) with  cup of clear juice (cranberry or apple), 4 glucose tablets, OR glucose gel. Recheck blood sugar in 15 minutes after treatment (to make sure it is greater than 70 mg/dL). If your blood sugar is not greater than 70 mg/dL on recheck, call [phone] for further instructions. Report your blood sugar to the short stay nurse when you get to Short Stay.  If you are admitted to the hospital after surgery: Your blood sugar will be checked by the staff and you will probably be given insulin after surgery (instead of oral diabetes medicines) to make sure you have good blood sugar levels. The goal for blood sugar control after surgery is 80-180 mg/dL.  Do NOT Smoke (Tobacco/Vaping) for 24 hours prior to your procedure.  If you use a CPAP at night, you may bring your mask/headgear for your overnight stay.   Contacts, glasses, piercing's, hearing aid's, dentures or partials may not be worn into surgery, please  bring cases for these belongings.    For patients admitted to the hospital, discharge time will be determined by your treatment team.   Patients discharged the day of surgery will not be allowed to drive home, and someone needs to stay with them for 24 hours.  SURGICAL WAITING ROOM VISITATION Patients having surgery or a procedure may have no more than 2 support people in the waiting area - these visitors may rotate.   Children under the age of 62 must have an adult with them who is not the patient. If the patient needs to  stay at the hospital during part of their recovery, the visitor guidelines for inpatient rooms apply. Pre-op nurse will coordinate an appropriate time for 1 support person to accompany patient in pre-op.  This support person may not rotate.   Please refer to the [facility] website for the visitor guidelines for Inpatients (after your surgery is over and you are in a regular room).    Special instructions:   [place]- Preparing For Surgery  Before surgery, you can play an important role. Because skin is not sterile, your skin needs to be as free of germs as possible. You can reduce the number of germs on your skin by washing with CHG (chlorahexidine gluconate) Soap before surgery.  CHG is an antiseptic cleaner which kills germs and bonds with the skin to continue killing germs even after washing.    Oral Hygiene is also important to reduce your risk of infection.  Remember - BRUSH YOUR TEETH THE MORNING OF SURGERY WITH YOUR REGULAR TOOTHPASTE  Please do not use if you have an allergy to CHG or antibacterial soaps. If your skin becomes reddened/irritated stop using the CHG.  Do not shave (including legs and underarms) for at least 48 hours prior to first CHG shower. It is OK to shave your face.  Please follow these instructions carefully.   Shower the NIGHT BEFORE SURGERY and the MORNING OF SURGERY  If you chose to wash your hair, wash your hair first as usual with your normal shampoo.  After you shampoo, rinse your hair and body thoroughly to remove the shampoo.  Use CHG Soap as you would any other liquid soap. You can apply CHG directly to the skin and wash gently with a scrungie or a clean washcloth.   Apply the CHG Soap to your body ONLY FROM THE NECK DOWN.  Do not use on open wounds or open sores. Avoid contact with your eyes, ears, mouth and genitals (private parts). Wash Face and genitals (private parts)  with your normal soap.   Wash thoroughly, paying special attention to the  area where your surgery will be performed.  Thoroughly rinse your body with warm water from the neck down.  DO NOT shower/wash with your normal soap after using and rinsing off the CHG Soap.  Pat yourself dry with a CLEAN TOWEL.  Wear CLEAN PAJAMAS to bed the night before surgery  Place CLEAN SHEETS on your bed the night before your surgery  DO NOT SLEEP WITH PETS.   Day of Surgery: Take a shower with CHG soap. Do not wear jewelry or makeup Do not wear lotions, powders, perfumes/colognes, or deodorant. Do not shave 48 hours prior to surgery.  Men may shave face and neck. Do not bring valuables to the hospital.  [facility] is not responsible for any belongings or valuables. Do not wear nail polish, gel polish, artificial nails, or any other type of covering on  natural nails (fingers and toes) If you have artificial nails or gel coating that need to be removed by a nail salon, please have this removed prior to surgery. Artificial nails or gel coating may interfere with anesthesia's ability to adequately monitor your vital signs.  Wear Clean/Comfortable clothing the morning of surgery Remember to brush your teeth WITH YOUR REGULAR TOOTHPASTE.   Please read over the following fact sheets that you were given.    If you received a COVID test during your pre-op visit  it is requested that you wear a mask when out in public, stay away from anyone that may not be feeling well and notify your surgeon if you develop symptoms. If you have been in contact with anyone that has tested positive in the last 10 days please notify you surgeon.

## 2022-04-12 NOTE — Progress Notes (Signed)
PCP - Dr' \\Walter'$  [facility] Cardiologist - Dr Adrian Prows EP- Dr Crissie Sickles  Chest x-ray - n/a EKG - 03/26/22 Stress Test - 02/06/20 PCV ECHO - 06/23/21 Cardiac Cath - 06/30/21  ICD Pacemaker - Medtronic Pacemaker.  Dia; Chamber Azure XT DR MRI.  Last remote check was on  03/21/22.  Sleep Study -  n/a CPAP - none  Do not take Metformin on the morning of surgery.  If your blood sugar is less than 70 mg/dL, you will need to treat for low blood sugar: Treat a low blood sugar (less than 70 mg/dL) with  cup of clear juice (cranberry or apple), 4 glucose tablets, OR glucose gel. Recheck blood sugar in 15 minutes after treatment (to make sure it is greater than 70 mg/dL). If your blood sugar is not greater than 70 mg/dL on recheck, call [phone] for further instructions.   Blood Thinner Instructions:  Follow your surgeon's instructions on when to stop Xarelto prior to surgery.  Last dose was on   Stop taking apixaban prior to surgery.  Last dose was on  If no instructions were given by your surgeon then you will need to call the office for those instructions.  Anesthesia review: Yes  STOP now taking any Aspirin (unless otherwise instructed by your surgeon), Aleve, Naproxen, Ibuprofen, Motrin, Advil, Goody's, BC's, all herbal medications, fish oil, and all vitamins.   Coronavirus Screening Do you have any of the following symptoms:  Cough yes/no: No Fever (>100.86F)  yes/no: No Runny nose yes/no: No Sore throat yes/no: No Difficulty breathing/shortness of breath  yes/no: No  Have you traveled in the last 14 days and where? yes/no: No  Patient verbalized understanding of instructions that were given to them at the PAT appointment. Patient was also instructed that they will need to review over the PAT instructions again at home before surgery.

## 2022-04-13 ENCOUNTER — Other Ambulatory Visit: Payer: Self-pay

## 2022-04-13 ENCOUNTER — Encounter (HOSPITAL_COMMUNITY)
Admission: RE | Admit: 2022-04-13 | Discharge: 2022-04-13 | Disposition: A | Discharge status: Home / Self Care | Payer: Medicare HMO | Source: Ambulatory Visit | Attending: Neurosurgery | Admitting: Neurosurgery

## 2022-04-13 ENCOUNTER — Encounter (HOSPITAL_COMMUNITY): Payer: Self-pay

## 2022-04-13 VITALS — BP 145/64 | HR 64 | Temp 98.0°F | Resp 17 | Ht 74.0 in | Wt 158.0 lb

## 2022-04-13 DIAGNOSIS — E785 Hyperlipidemia, unspecified: Secondary | ICD-10-CM | POA: Insufficient documentation

## 2022-04-13 DIAGNOSIS — I2510 Atherosclerotic heart disease of native coronary artery without angina pectoris: Secondary | ICD-10-CM | POA: Insufficient documentation

## 2022-04-13 DIAGNOSIS — I495 Sick sinus syndrome: Secondary | ICD-10-CM | POA: Diagnosis not present

## 2022-04-13 DIAGNOSIS — Z01812 Encounter for preprocedural laboratory examination: Secondary | ICD-10-CM | POA: Insufficient documentation

## 2022-04-13 DIAGNOSIS — I10 Essential (primary) hypertension: Secondary | ICD-10-CM | POA: Diagnosis not present

## 2022-04-13 DIAGNOSIS — I480 Paroxysmal atrial fibrillation: Secondary | ICD-10-CM | POA: Insufficient documentation

## 2022-04-13 DIAGNOSIS — Z01818 Encounter for other preprocedural examination: Secondary | ICD-10-CM

## 2022-04-13 DIAGNOSIS — Z951 Presence of aortocoronary bypass graft: Secondary | ICD-10-CM | POA: Diagnosis not present

## 2022-04-13 DIAGNOSIS — I340 Nonrheumatic mitral (valve) insufficiency: Secondary | ICD-10-CM | POA: Diagnosis not present

## 2022-04-13 LAB — CBC
HCT: 38.4 % — ABNORMAL LOW (ref 39.0–52.0)
Hemoglobin: 12.6 g/dL — ABNORMAL LOW (ref 13.0–17.0)
MCH: 33.8 pg (ref 26.0–34.0)
MCHC: 32.8 g/dL (ref 30.0–36.0)
MCV: 102.9 fL — ABNORMAL HIGH (ref 80.0–100.0)
Platelets: 134 10*3/uL — ABNORMAL LOW (ref 150–400)
RBC: 3.73 MIL/uL — ABNORMAL LOW (ref 4.22–5.81)
RDW: 13 % (ref 11.5–15.5)
WBC: 3.4 10*3/uL — ABNORMAL LOW (ref 4.0–10.5)
nRBC: 0 % (ref 0.0–0.2)

## 2022-04-13 LAB — SURGICAL PCR SCREEN
MRSA, PCR: NEGATIVE
Staphylococcus aureus: NEGATIVE

## 2022-04-13 LAB — GLUCOSE, CAPILLARY: Glucose-Capillary: 124 mg/dL — ABNORMAL HIGH (ref 70–99)

## 2022-04-13 NOTE — Progress Notes (Signed)
[employer] Rep Leanna Sato called to let me know he will be at [facility] for this 04/16/22 surgery.

## 2022-04-14 NOTE — Progress Notes (Addendum)
Anesthesia Chart Review:  Follows with cardiology for history of CAD s/p  CABG in 2007 in [place] with LIMA to LAD, SVG to OM which is occluded by angiography on 02/04/2015, occluded SVG to RCA. 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 Due to dyspnea on exertion, he underwent coronary angiography on 11/28/2020 revealing no change from 2019 and echocardiogram revealing improved diastolic function.  He was last seen by Dr. Einar Gip 03/26/2022 and upcoming spine surgery was discussed.  Per note, "He is presently doing well, except he has lost about 10 pounds in weight as he is edentulous as he had his teeth extracted recently and has lost about 10 pounds in weight.  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  He is scheduled to see neurosurgery soon and they plan on operating him.  I do not think he is high risk from cardiac standpoint and can be taken up for surgery with low risk."  Patient reports last dose Eliquis 04/12/2022.  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  Non-insulin-dependent DM2, A1c 6.5 on 12/11/21.  Preop CBC reviewed, mild anemia with hemoglobin 12.6, platelets mildly low at 134k, WBC mildly low at 3.4. BMP hemolyzed and will need to be drawn day of surgery.  EKG 03/26/2022: Ventricularly paced rhythm at rate of 88.   Echocardiogram 06/23/2021:  Normal LV systolic function with visual EF 55-60%. Left ventricle cavity  is normal in size. Moderate left ventricular hypertrophy. Normal global  wall motion. Normal diastolic filling pattern, normal LAP.   Mild to moderate aortic regurgitation.  Mild (Grade I) mitral regurgitation.  Mild tricuspid regurgitation. No evidence of pulmonary hypertension.  Compared to study 03/47/4259 diastolic function improved (indeterminate to normal), moderate TR is now mild, Aortic root was 43m to 371m.    Coronary angiography 06/30/2021: RCA: Long CTO.  He has ipsilateral collaterals as contralateral collaterals from the LAD. LM: Large vessel.  Mildly calcified. LAD: Ostial 40 to 50% stenosis, diffuse.  Continues as a large D1 as the LAD is occluded in the proximal segment.   LIMA to LAD is widely RI: Moderate caliber vessel, ostial 40 to 50% stenosis, calcific. CX: 2.5 x 15 mm resolute Onyx DES placed 11/29/2017 in the ostium of the CX is widely patent.  CX gives origin to large OM1 and OM 2.  There is mild diffuse disease. LV: Mild inferior hypokinesis, preserved LVEF at 50 to 55%.  No significant MR. Hemodynamics: LV 93/0, EDP 7 mmHg.  Ao 89/41, mean 60 mmHg.  There was no pressure gradient across the aortic valve.  Recommendation: Coronary anatomy is not significantly changed from 2019.  He has small vessel disease, continue medical therapy as indicated.  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  70 mL contrast utilized.   [facility]ynonia [facility]CTem[facility] Stay Center/Anesthesiology Phone ([phone]/20/2023 2:34 PM

## 2022-04-14 NOTE — Anesthesia Preprocedure Evaluation (Addendum)
Anesthesia Evaluation  Patient identified by MRN, date of birth, ID band Patient awake    Reviewed: Allergy & Precautions, NPO status , Patient's Chart, lab work & pertinent test results  Airway Mallampati: II  TM Distance: >3 FB Neck ROM: Full    Dental  (+) Edentulous Upper, Dental Advisory Given   Pulmonary former smoker,    breath sounds clear to auscultation       Cardiovascular hypertension, Pt. on medications and Pt. on home beta blockers + CAD, + Cardiac Stents and + CABG  + pacemaker  Rhythm:Regular Rate:Normal  Echo: Normal LV systolic function with visual EF 55-60%. Left ventricle cavity  is normal in size. Moderate left ventricular hypertrophy. Normal global  wall motion. Normal diastolic filling pattern, normal LAP.  Mild to moderate aortic regurgitation.  Mild (Grade I) mitral regurgitation.  Mild tricuspid regurgitation. No evidence of pulmonary hypertension.  Compared to study 79/89/2119 diastolic function improved (indeterminate to  normal), moderate TR is now mild, Aortic root was 93m to 319m.    Neuro/Psych negative neurological ROS  negative psych ROS   GI/Hepatic negative GI ROS, Neg liver ROS,   Endo/Other  diabetes, Type 2, Oral Hypoglycemic Agents  Renal/GU negative Renal ROS     Musculoskeletal negative musculoskeletal ROS (+)   Abdominal Normal abdominal exam  (+)   Peds  Hematology negative hematology ROS (+)   Anesthesia Other Findings   Reproductive/Obstetrics                           Anesthesia Physical Anesthesia Plan  ASA: 3  Anesthesia Plan: General   Post-op Pain Management:    Induction: Intravenous  PONV Risk Score and Plan: 3 and Ondansetron, Dexamethasone and Midazolam  Airway Management Planned: Oral ETT  Additional Equipment: None  Intra-op Plan:   Post-operative Plan: Extubation in OR  Informed Consent: I have reviewed the  patients History and Physical, chart, labs and discussed the procedure including the risks, benefits and alternatives for the proposed anesthesia with the patient or authorized representative who has indicated his/her understanding and acceptance.     Dental advisory given  Plan Discussed with: CRNA  Anesthesia Plan Comments: (PAT note by JaKaroline CaldwellPA-C: [place]s with cardiology [place] of [place] SVG to OM which is occluded by angiography on 02/04/2015, occluded SVG to RCA. 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 Due to dyspnea on exertion, he underwent coronary angiography on 11/28/2020 revealing no change from 2019 and echocardiogram revealing improved diastolic function. He was last seen by Dr. GaEinar Gip/07/2021 and upcoming spine surgery was discussed.  Per note, "He is presently doing well, except he has lost about 10 pounds in weight as he is edentulous as he had his teeth extracted recently and has lost about 10 pounds in weight. 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 He is scheduled to see neurosurgery soon and they plan on operating him. I do not think he is high risk from cardiac standpoint and can be taken up for surgery with low risk."  Patient reports  last dose Eliquis 04/12/2022.  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  Non-insulin-dependent DM2, A1c 6.5 on 12/11/21.  Preop CBC reviewed, mild anemia with hemoglobin 12.6, platelets mildly low at 134k, WBC mildly low at 3.4. BMP hemolyzed and  will need to be drawn day of surgery.  EKG 03/26/2022: Ventricularly paced rhythm at rate of 88.  Echocardiogram 06/23/2021:  Normal LV systolic function with visual EF 55-60%. Left ventricle cavity  is normal in size. Moderate left ventricular hypertrophy. Normal global  wall motion. Normal diastolic filling pattern, normal LAP.  Mild to moderate aortic regurgitation.  Mild (Grade I) mitral regurgitation.  Mild tricuspid regurgitation. No evidence of pulmonary hypertension.  Compared to study 83/66/2947 diastolic function improved (indeterminate to normal), moderate TR is now mild, Aortic root was 39m to 358m.   Coronary angiography 06/30/2021: RCA: Long CTO. He has ipsilateral collaterals as contralateral collaterals from the LAD. LMML:YYTKPessel. Mildly calcified. LATWS:FKCLEX0 to 50% stenosis, diffuse. Continues as a large D1 as the LAD is occluded in the proximal segment.  LIMAto LAD is widely RI: Moderate caliber vessel, ostial 40 to 50% stenosis, calcific. CX:2.5 x 15 mm resolute Onyx DES placed 11/29/2017 in the ostium of the CX is widely patent. CX gives origin to large OM1 and OM 2. There is mild diffuse disease. LV: Mild inferior hypokinesis, preserved LVEF at 50 to 55%. No significant MR. Hemodynamics: LV 93/0, EDP 7 mmHg. Ao 89/41, mean 60 mmHg. There was no pressure gradient across the aortic valve.  Recommendation: Coronary anatomy is not significantly changed from 2019. He has small vessel disease, continue medical therapy as indicated. 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 70 mL contrast utilized. )    Anesthesia Quick Evaluation

## 2022-04-15 ENCOUNTER — Encounter: Payer: Self-pay | Admitting: Cardiology

## 2022-04-16 ENCOUNTER — Encounter (HOSPITAL_COMMUNITY): Payer: Self-pay | Admitting: Neurosurgery

## 2022-04-16 ENCOUNTER — Ambulatory Visit (HOSPITAL_COMMUNITY): Payer: Medicare HMO | Admitting: Vascular Surgery

## 2022-04-16 ENCOUNTER — Other Ambulatory Visit: Payer: Self-pay

## 2022-04-16 ENCOUNTER — Observation Stay (HOSPITAL_COMMUNITY)
Admission: RE | Admit: 2022-04-16 | Discharge: 2022-04-17 | Disposition: A | Discharge status: Home / Self Care | Payer: Medicare HMO | Attending: Neurosurgery | Admitting: Neurosurgery

## 2022-04-16 ENCOUNTER — Ambulatory Visit (HOSPITAL_COMMUNITY)
Admission: RE | Disposition: A | Discharge status: Home / Self Care | Payer: Self-pay | Source: Home / Self Care | Attending: Neurosurgery

## 2022-04-16 ENCOUNTER — Ambulatory Visit (HOSPITAL_COMMUNITY): Payer: Medicare HMO

## 2022-04-16 ENCOUNTER — Ambulatory Visit (HOSPITAL_COMMUNITY): Payer: Medicare HMO | Admitting: Certified Registered Nurse Anesthetist

## 2022-04-16 DIAGNOSIS — Z01818 Encounter for other preprocedural examination: Secondary | ICD-10-CM

## 2022-04-16 DIAGNOSIS — Z7984 Long term (current) use of oral hypoglycemic drugs: Secondary | ICD-10-CM | POA: Insufficient documentation

## 2022-04-16 DIAGNOSIS — M50021 Cervical disc disorder at C4-C5 level with myelopathy: Secondary | ICD-10-CM | POA: Diagnosis not present

## 2022-04-16 DIAGNOSIS — I10 Essential (primary) hypertension: Secondary | ICD-10-CM | POA: Diagnosis not present

## 2022-04-16 DIAGNOSIS — M4802 Spinal stenosis, cervical region: Secondary | ICD-10-CM

## 2022-04-16 DIAGNOSIS — G992 Myelopathy in diseases classified elsewhere: Principal | ICD-10-CM | POA: Diagnosis present

## 2022-04-16 DIAGNOSIS — Z7901 Long term (current) use of anticoagulants: Secondary | ICD-10-CM | POA: Diagnosis not present

## 2022-04-16 DIAGNOSIS — E119 Type 2 diabetes mellitus without complications: Secondary | ICD-10-CM | POA: Insufficient documentation

## 2022-04-16 DIAGNOSIS — Z951 Presence of aortocoronary bypass graft: Secondary | ICD-10-CM | POA: Diagnosis not present

## 2022-04-16 DIAGNOSIS — M5002 Cervical disc disorder with myelopathy, mid-cervical region, unspecified level: Secondary | ICD-10-CM | POA: Diagnosis not present

## 2022-04-16 DIAGNOSIS — I25119 Atherosclerotic heart disease of native coronary artery with unspecified angina pectoris: Secondary | ICD-10-CM | POA: Diagnosis not present

## 2022-04-16 DIAGNOSIS — Z79899 Other long term (current) drug therapy: Secondary | ICD-10-CM | POA: Diagnosis not present

## 2022-04-16 DIAGNOSIS — Z981 Arthrodesis status: Secondary | ICD-10-CM | POA: Diagnosis not present

## 2022-04-16 DIAGNOSIS — Z955 Presence of coronary angioplasty implant and graft: Secondary | ICD-10-CM | POA: Insufficient documentation

## 2022-04-16 DIAGNOSIS — I2510 Atherosclerotic heart disease of native coronary artery without angina pectoris: Secondary | ICD-10-CM

## 2022-04-16 DIAGNOSIS — G959 Disease of spinal cord, unspecified: Secondary | ICD-10-CM | POA: Diagnosis not present

## 2022-04-16 DIAGNOSIS — Z87891 Personal history of nicotine dependence: Secondary | ICD-10-CM

## 2022-04-16 DIAGNOSIS — Z950 Presence of cardiac pacemaker: Secondary | ICD-10-CM | POA: Insufficient documentation

## 2022-04-16 HISTORY — PX: POSTERIOR CERVICAL LAMINECTOMY: SHX2248

## 2022-04-16 LAB — BASIC METABOLIC PANEL
Anion gap: 9 (ref 5–15)
BUN: 22 mg/dL (ref 8–23)
CO2: 23 mmol/L (ref 22–32)
Calcium: 9.6 mg/dL (ref 8.9–10.3)
Chloride: 103 mmol/L (ref 98–111)
Creatinine, Ser: 1.17 mg/dL (ref 0.61–1.24)
GFR, Estimated: >60 mL/min (ref >60)
Glucose, Bld: 125 mg/dL — ABNORMAL HIGH (ref 70–99)
Potassium: 3.6 mmol/L (ref 3.5–5.1)
Sodium: 135 mmol/L (ref 135–145)

## 2022-04-16 LAB — HEMOGLOBIN A1C
Hgb A1c MFr Bld: 5.7 % — ABNORMAL HIGH (ref 4.8–5.6)
Mean Plasma Glucose: 116.89 mg/dL

## 2022-04-16 LAB — PROTIME-INR
INR: 1.2 (ref 0.8–1.2)
Prothrombin Time: 15 seconds (ref 11.4–15.2)

## 2022-04-16 LAB — GLUCOSE, CAPILLARY
Glucose-Capillary: 117 mg/dL — ABNORMAL HIGH (ref 70–99)
Glucose-Capillary: 122 mg/dL — ABNORMAL HIGH (ref 70–99)
Glucose-Capillary: 147 mg/dL — ABNORMAL HIGH (ref 70–99)
Glucose-Capillary: 100 mg/dL — ABNORMAL HIGH (ref 70–99)
Glucose-Capillary: 350 mg/dL — ABNORMAL HIGH (ref 70–99)

## 2022-04-16 LAB — APTT: aPTT: 30 seconds (ref 24–36)

## 2022-04-16 SURGERY — POSTERIOR CERVICAL LAMINECTOMY
Anesthesia: General

## 2022-04-16 MED ORDER — ACETAMINOPHEN 10 MG/ML IV SOLN: 1000.0000 mg | Freq: Once | INTRAVENOUS | Status: DC | PRN

## 2022-04-16 MED ORDER — ONDANSETRON HCL 4 MG/2ML IJ SOLN
INTRAMUSCULAR | Status: AC
  Filled 2022-04-16: qty 2

## 2022-04-16 MED ORDER — LIDOCAINE 2% (20 MG/ML) 5 ML SYRINGE
INTRAMUSCULAR | Status: DC | PRN
  Administered 2022-04-16: 40 mg via INTRAVENOUS

## 2022-04-16 MED ORDER — CEFAZOLIN SODIUM-DEXTROSE 2-4 GM/100ML-% IV SOLN
INTRAVENOUS | Status: AC
  Filled 2022-04-16: qty 100

## 2022-04-16 MED ORDER — SODIUM CHLORIDE 0.9% FLUSH
3.0000 mL | Freq: Two times a day (BID) | INTRAVENOUS | Status: DC
  Administered 2022-04-16 (×2): 3 mL via INTRAVENOUS

## 2022-04-16 MED ORDER — NITROGLYCERIN 0.4 MG SL SUBL: 0.4000 mg | SUBLINGUAL_TABLET | SUBLINGUAL | Status: DC | PRN

## 2022-04-16 MED ORDER — METFORMIN HCL ER 500 MG PO TB24
1000.0000 mg | ORAL_TABLET | Freq: Every day | ORAL | Status: DC
  Administered 2022-04-16: 1000 mg via ORAL
  Filled 2022-04-16: qty 2

## 2022-04-16 MED ORDER — ONDANSETRON HCL 4 MG/2ML IJ SOLN: 4.0000 mg | Freq: Four times a day (QID) | INTRAMUSCULAR | Status: DC | PRN

## 2022-04-16 MED ORDER — LIDOCAINE 2% (20 MG/ML) 5 ML SYRINGE
INTRAMUSCULAR | Status: AC
  Filled 2022-04-16: qty 5

## 2022-04-16 MED ORDER — INSULIN ASPART 100 UNIT/ML IJ SOLN
0.0000 [IU] | Freq: Three times a day (TID) | INTRAMUSCULAR | Status: DC
  Administered 2022-04-17: 3 [IU] via SUBCUTANEOUS

## 2022-04-16 MED ORDER — THROMBIN 5000 UNITS EX SOLR
OROMUCOSAL | Status: DC | PRN
  Administered 2022-04-16: 5 mL via TOPICAL

## 2022-04-16 MED ORDER — LACTATED RINGERS IV SOLN: INTRAVENOUS | Status: DC

## 2022-04-16 MED ORDER — METOPROLOL TARTRATE 25 MG PO TABS
25.0000 mg | ORAL_TABLET | Freq: Two times a day (BID) | ORAL | Status: DC
  Administered 2022-04-16: 25 mg via ORAL
  Filled 2022-04-16: qty 1

## 2022-04-16 MED ORDER — CHLORHEXIDINE GLUCONATE CLOTH 2 % EX PADS: 6.0000 | MEDICATED_PAD | Freq: Once | CUTANEOUS | Status: DC

## 2022-04-16 MED ORDER — EPHEDRINE 5 MG/ML INJ
INTRAVENOUS | Status: AC
  Filled 2022-04-16: qty 5

## 2022-04-16 MED ORDER — METOPROLOL TARTRATE 12.5 MG HALF TABLET
25.0000 mg | ORAL_TABLET | Freq: Once | ORAL | Status: AC
  Administered 2022-04-16: 25 mg via ORAL
  Filled 2022-04-16: qty 2

## 2022-04-16 MED ORDER — ACETAMINOPHEN 160 MG/5ML PO SOLN: 325.0000 mg | Freq: Once | ORAL | Status: DC | PRN

## 2022-04-16 MED ORDER — PHENYLEPHRINE 80 MCG/ML (10ML) SYRINGE FOR IV PUSH (FOR BLOOD PRESSURE SUPPORT)
PREFILLED_SYRINGE | INTRAVENOUS | Status: DC | PRN
  Administered 2022-04-16: 160 ug via INTRAVENOUS
  Administered 2022-04-16: 240 ug via INTRAVENOUS
  Administered 2022-04-16: 160 ug via INTRAVENOUS
  Administered 2022-04-16 (×3): 80 ug via INTRAVENOUS
  Administered 2022-04-16: 160 ug via INTRAVENOUS

## 2022-04-16 MED ORDER — ACETAMINOPHEN 325 MG PO TABS: 325.0000 mg | ORAL_TABLET | Freq: Once | ORAL | Status: DC | PRN

## 2022-04-16 MED ORDER — EPHEDRINE SULFATE-NACL 50-0.9 MG/10ML-% IV SOSY
PREFILLED_SYRINGE | INTRAVENOUS | Status: DC | PRN
  Administered 2022-04-16: 5 mg via INTRAVENOUS

## 2022-04-16 MED ORDER — PROMETHAZINE HCL 25 MG/ML IJ SOLN: 6.2500 mg | INTRAMUSCULAR | Status: DC | PRN

## 2022-04-16 MED ORDER — LATANOPROST 0.005 % OP SOLN
1.0000 [drp] | Freq: Every day | OPHTHALMIC | Status: DC
  Administered 2022-04-16: 1 [drp] via OPHTHALMIC
  Filled 2022-04-16: qty 2.5

## 2022-04-16 MED ORDER — LIDOCAINE-EPINEPHRINE 1 %-1:100000 IJ SOLN
INTRAMUSCULAR | Status: AC
  Filled 2022-04-16: qty 1

## 2022-04-16 MED ORDER — PROPOFOL 10 MG/ML IV BOLUS
INTRAVENOUS | Status: DC | PRN
  Administered 2022-04-16: 80 mg via INTRAVENOUS
  Administered 2022-04-16: 40 mg via INTRAVENOUS

## 2022-04-16 MED ORDER — LACTATED RINGERS IV SOLN: INTRAVENOUS | Status: DC | PRN

## 2022-04-16 MED ORDER — FENTANYL CITRATE (PF) 100 MCG/2ML IJ SOLN
INTRAMUSCULAR | Status: AC
  Filled 2022-04-16: qty 2

## 2022-04-16 MED ORDER — EZETIMIBE 10 MG PO TABS
10.0000 mg | ORAL_TABLET | Freq: Every day | ORAL | Status: DC
  Administered 2022-04-16: 10 mg via ORAL
  Filled 2022-04-16: qty 1

## 2022-04-16 MED ORDER — SODIUM CHLORIDE 0.9% FLUSH: 3.0000 mL | INTRAVENOUS | Status: DC | PRN

## 2022-04-16 MED ORDER — PROPOFOL 10 MG/ML IV BOLUS
INTRAVENOUS | Status: AC
  Filled 2022-04-16: qty 20

## 2022-04-16 MED ORDER — BACITRACIN ZINC 500 UNIT/GM EX OINT
TOPICAL_OINTMENT | CUTANEOUS | Status: DC | PRN
  Administered 2022-04-16: 1 via TOPICAL

## 2022-04-16 MED ORDER — CEFAZOLIN SODIUM-DEXTROSE 2-4 GM/100ML-% IV SOLN
2.0000 g | Freq: Three times a day (TID) | INTRAVENOUS | Status: AC
  Administered 2022-04-16 (×2): 2 g via INTRAVENOUS
  Filled 2022-04-16 (×2): qty 100

## 2022-04-16 MED ORDER — THROMBIN (RECOMBINANT) 5000 UNITS EX SOLR
CUTANEOUS | Status: DC | PRN
  Administered 2022-04-16: 10 mL via TOPICAL

## 2022-04-16 MED ORDER — LIDOCAINE-EPINEPHRINE 1 %-1:100000 IJ SOLN
INTRAMUSCULAR | Status: DC | PRN
  Administered 2022-04-16: 4 mL

## 2022-04-16 MED ORDER — PANTOPRAZOLE SODIUM 40 MG PO TBEC
40.0000 mg | DELAYED_RELEASE_TABLET | Freq: Every day | ORAL | Status: DC
  Administered 2022-04-16: 40 mg via ORAL
  Filled 2022-04-16: qty 1

## 2022-04-16 MED ORDER — FENTANYL CITRATE (PF) 250 MCG/5ML IJ SOLN
INTRAMUSCULAR | Status: AC
  Filled 2022-04-16: qty 5

## 2022-04-16 MED ORDER — INSULIN ASPART 100 UNIT/ML IJ SOLN: 0.0000 [IU] | INTRAMUSCULAR | Status: DC | PRN

## 2022-04-16 MED ORDER — FENTANYL CITRATE (PF) 250 MCG/5ML IJ SOLN
INTRAMUSCULAR | Status: DC | PRN
  Administered 2022-04-16: 50 ug via INTRAVENOUS

## 2022-04-16 MED ORDER — FLEET ENEMA 7-19 GM/118ML RE ENEM: 1.0000 | ENEMA | Freq: Once | RECTAL | Status: DC | PRN

## 2022-04-16 MED ORDER — DEXAMETHASONE SODIUM PHOSPHATE 10 MG/ML IJ SOLN
INTRAMUSCULAR | Status: DC | PRN
  Administered 2022-04-16: 10 mg via INTRAVENOUS

## 2022-04-16 MED ORDER — PHENYLEPHRINE HCL-NACL 20-0.9 MG/250ML-% IV SOLN
INTRAVENOUS | Status: DC | PRN
  Administered 2022-04-16: 25 ug/min via INTRAVENOUS

## 2022-04-16 MED ORDER — BISACODYL 10 MG RE SUPP: 10.0000 mg | Freq: Every day | RECTAL | Status: DC | PRN

## 2022-04-16 MED ORDER — ROCURONIUM BROMIDE 10 MG/ML (PF) SYRINGE
PREFILLED_SYRINGE | INTRAVENOUS | Status: DC | PRN
  Administered 2022-04-16: 30 mg via INTRAVENOUS
  Administered 2022-04-16: 70 mg via INTRAVENOUS

## 2022-04-16 MED ORDER — SODIUM CHLORIDE 0.9 % IV SOLN: 250.0000 mL | INTRAVENOUS | Status: DC

## 2022-04-16 MED ORDER — PHENYLEPHRINE 80 MCG/ML (10ML) SYRINGE FOR IV PUSH (FOR BLOOD PRESSURE SUPPORT)
PREFILLED_SYRINGE | INTRAVENOUS | Status: AC
  Filled 2022-04-16: qty 10

## 2022-04-16 MED ORDER — THROMBIN 5000 UNITS EX SOLR
CUTANEOUS | Status: AC
  Filled 2022-04-16: qty 15000

## 2022-04-16 MED ORDER — LOSARTAN POTASSIUM 50 MG PO TABS
25.0000 mg | ORAL_TABLET | Freq: Every day | ORAL | Status: DC
  Administered 2022-04-16: 25 mg via ORAL
  Filled 2022-04-16: qty 1

## 2022-04-16 MED ORDER — FENTANYL CITRATE (PF) 100 MCG/2ML IJ SOLN
25.0000 ug | INTRAMUSCULAR | Status: DC | PRN
  Administered 2022-04-16 (×3): 25 ug via INTRAVENOUS

## 2022-04-16 MED ORDER — PANTOPRAZOLE SODIUM 40 MG IV SOLR: 40.0000 mg | Freq: Every day | INTRAVENOUS | Status: DC

## 2022-04-16 MED ORDER — CEFAZOLIN SODIUM-DEXTROSE 2-4 GM/100ML-% IV SOLN
2.0000 g | INTRAVENOUS | Status: AC
  Administered 2022-04-16: 2 g via INTRAVENOUS

## 2022-04-16 MED ORDER — SENNA 8.6 MG PO TABS
1.0000 | ORAL_TABLET | Freq: Two times a day (BID) | ORAL | Status: DC
  Administered 2022-04-16: 8.6 mg via ORAL
  Filled 2022-04-16: qty 1

## 2022-04-16 MED ORDER — POLYETHYLENE GLYCOL 3350 17 G PO PACK: 17.0000 g | PACK | Freq: Every day | ORAL | Status: DC | PRN

## 2022-04-16 MED ORDER — OXYCODONE HCL 5 MG PO TABS
5.0000 mg | ORAL_TABLET | ORAL | Status: DC | PRN
  Administered 2022-04-16: 5 mg via ORAL
  Filled 2022-04-16: qty 1

## 2022-04-16 MED ORDER — INSULIN ASPART 100 UNIT/ML IJ SOLN: 0.0000 [IU] | Freq: Three times a day (TID) | INTRAMUSCULAR | Status: DC

## 2022-04-16 MED ORDER — SODIUM CHLORIDE 0.9 % IV SOLN: INTRAVENOUS | Status: DC

## 2022-04-16 MED ORDER — ACETAMINOPHEN 650 MG RE SUPP: 650.0000 mg | RECTAL | Status: DC | PRN

## 2022-04-16 MED ORDER — ISOSORBIDE MONONITRATE ER 30 MG PO TB24
30.0000 mg | ORAL_TABLET | Freq: Every day | ORAL | Status: DC
  Administered 2022-04-16: 30 mg via ORAL
  Filled 2022-04-16 (×2): qty 1

## 2022-04-16 MED ORDER — MORPHINE SULFATE (PF) 2 MG/ML IV SOLN: 2.0000 mg | INTRAVENOUS | Status: DC | PRN

## 2022-04-16 MED ORDER — METHOCARBAMOL 1000 MG/10ML IJ SOLN: 500.0000 mg | Freq: Four times a day (QID) | INTRAVENOUS | Status: DC | PRN

## 2022-04-16 MED ORDER — DOCUSATE SODIUM 100 MG PO CAPS
100.0000 mg | ORAL_CAPSULE | Freq: Two times a day (BID) | ORAL | Status: DC
  Administered 2022-04-16: 100 mg via ORAL
  Filled 2022-04-16: qty 1

## 2022-04-16 MED ORDER — METHOCARBAMOL 500 MG PO TABS
500.0000 mg | ORAL_TABLET | Freq: Four times a day (QID) | ORAL | Status: DC | PRN
  Administered 2022-04-16 – 2022-04-17 (×2): 500 mg via ORAL
  Filled 2022-04-16 (×2): qty 1

## 2022-04-16 MED ORDER — OXYCODONE HCL 5 MG PO TABS
10.0000 mg | ORAL_TABLET | ORAL | Status: DC | PRN
  Administered 2022-04-16 – 2022-04-17 (×2): 10 mg via ORAL
  Filled 2022-04-16 (×2): qty 2

## 2022-04-16 MED ORDER — BUPIVACAINE HCL (PF) 0.5 % IJ SOLN
INTRAMUSCULAR | Status: AC
  Filled 2022-04-16: qty 30

## 2022-04-16 MED ORDER — DORZOLAMIDE HCL-TIMOLOL MAL 2-0.5 % OP SOLN
1.0000 [drp] | Freq: Two times a day (BID) | OPHTHALMIC | Status: DC
  Administered 2022-04-16: 1 [drp] via OPHTHALMIC
  Filled 2022-04-16: qty 10

## 2022-04-16 MED ORDER — ONDANSETRON HCL 4 MG PO TABS: 4.0000 mg | ORAL_TABLET | Freq: Four times a day (QID) | ORAL | Status: DC | PRN

## 2022-04-16 MED ORDER — ROCURONIUM BROMIDE 10 MG/ML (PF) SYRINGE
PREFILLED_SYRINGE | INTRAVENOUS | Status: AC
  Filled 2022-04-16: qty 10

## 2022-04-16 MED ORDER — MENTHOL 3 MG MT LOZG: 1.0000 | LOZENGE | OROMUCOSAL | Status: DC | PRN

## 2022-04-16 MED ORDER — PHENOL 1.4 % MT LIQD: 1.0000 | OROMUCOSAL | Status: DC | PRN

## 2022-04-16 MED ORDER — ACETAMINOPHEN 325 MG PO TABS
650.0000 mg | ORAL_TABLET | ORAL | Status: DC | PRN
  Administered 2022-04-16 – 2022-04-17 (×3): 650 mg via ORAL
  Filled 2022-04-16 (×3): qty 2

## 2022-04-16 MED ORDER — METFORMIN HCL 500 MG PO TABS: 1000.0000 mg | ORAL_TABLET | Freq: Every evening | ORAL | Status: DC

## 2022-04-16 MED ORDER — INSULIN ASPART 100 UNIT/ML IJ SOLN
0.0000 [IU] | Freq: Every day | INTRAMUSCULAR | Status: DC
  Administered 2022-04-16: 5 [IU] via SUBCUTANEOUS

## 2022-04-16 MED ORDER — ONDANSETRON HCL 4 MG/2ML IJ SOLN
INTRAMUSCULAR | Status: DC | PRN
  Administered 2022-04-16: 4 mg via INTRAVENOUS

## 2022-04-16 MED ORDER — SUGAMMADEX SODIUM 200 MG/2ML IV SOLN
INTRAVENOUS | Status: DC | PRN
  Administered 2022-04-16: 200 mg via INTRAVENOUS

## 2022-04-16 MED ORDER — ROSUVASTATIN CALCIUM 20 MG PO TABS
20.0000 mg | ORAL_TABLET | Freq: Every day | ORAL | Status: DC
  Administered 2022-04-16: 20 mg via ORAL
  Filled 2022-04-16: qty 1

## 2022-04-16 MED ORDER — BUPIVACAINE HCL (PF) 0.5 % IJ SOLN
INTRAMUSCULAR | Status: DC | PRN
  Administered 2022-04-16: 4 mL

## 2022-04-16 MED ORDER — METFORMIN HCL ER 500 MG PO TB24
500.0000 mg | ORAL_TABLET | Freq: Every day | ORAL | Status: DC
  Administered 2022-04-17: 500 mg via ORAL
  Filled 2022-04-16: qty 1

## 2022-04-16 MED ORDER — BACITRACIN ZINC 500 UNIT/GM EX OINT
TOPICAL_OINTMENT | CUTANEOUS | Status: AC
  Filled 2022-04-16: qty 28.35

## 2022-04-16 MED ORDER — CHLORHEXIDINE GLUCONATE 0.12 % MT SOLN
OROMUCOSAL | Status: AC
  Administered 2022-04-16: 15 mL
  Filled 2022-04-16: qty 15

## 2022-04-16 MED ORDER — DEXAMETHASONE SODIUM PHOSPHATE 10 MG/ML IJ SOLN
INTRAMUSCULAR | Status: AC
  Filled 2022-04-16: qty 1

## 2022-04-16 SURGICAL SUPPLY — 47 items
BAG COUNTER SPONGE SURGICOUNT (BAG) ×1 IMPLANT
BAND RUBBER #18 3X1/16 STRL (MISCELLANEOUS) IMPLANT
BENZOIN TINCTURE PRP APPL 2/3 (GAUZE/BANDAGES/DRESSINGS) ×2 IMPLANT
BLADE CLIPPER SURG (BLADE) ×1 IMPLANT
BLADE ULTRA TIP 2M (BLADE) IMPLANT
BUR MATCHSTICK NEURO 3.0 LAGG (BURR) ×1 IMPLANT
CANISTER SUCT 3000ML PPV (MISCELLANEOUS) ×1 IMPLANT
DERMABOND ADVANCED .7 DNX12 (GAUZE/BANDAGES/DRESSINGS) IMPLANT
DRAPE C-ARM 42X72 X-RAY (DRAPES) ×2 IMPLANT
DRAPE LAPAROTOMY 100X72 PEDS (DRAPES) ×1 IMPLANT
DRAPE MICROSCOPE SLANT 54X150 (MISCELLANEOUS) ×1 IMPLANT
DRSG OPSITE 4X5.5 SM (GAUZE/BANDAGES/DRESSINGS) ×2 IMPLANT
DRSG OPSITE POSTOP 4X6 (GAUZE/BANDAGES/DRESSINGS) IMPLANT
DURAPREP 26ML APPLICATOR (WOUND CARE) IMPLANT
ELECT REM PT RETURN 9FT ADLT (ELECTROSURGICAL) ×1
ELECTRODE REM PT RTRN 9FT ADLT (ELECTROSURGICAL) ×1 IMPLANT
GAUZE 4X4 16PLY ~~LOC~~+RFID DBL (SPONGE) IMPLANT
GLOVE BIO SURGEON STRL SZ7.5 (GLOVE) IMPLANT
GLOVE BIOGEL PI IND STRL 7.5 (GLOVE) ×2 IMPLANT
GLOVE ECLIPSE 7.0 STRL STRAW (GLOVE) ×1 IMPLANT
GOWN STRL REUS W/ TWL LRG LVL3 (GOWN DISPOSABLE) IMPLANT
GOWN STRL REUS W/ TWL XL LVL3 (GOWN DISPOSABLE) ×1 IMPLANT
GOWN STRL REUS W/TWL LRG LVL3 (GOWN DISPOSABLE)
GOWN STRL REUS W/TWL XL LVL3 (GOWN DISPOSABLE) ×1
HEMOSTAT POWDER KIT SURGIFOAM (HEMOSTASIS) ×1 IMPLANT
KIT BASIN OR (CUSTOM PROCEDURE TRAY) ×1 IMPLANT
KIT TURNOVER KIT B (KITS) ×1 IMPLANT
NDL HYPO 25X1 1.5 SAFETY (NEEDLE) ×1 IMPLANT
NEEDLE HYPO 25X1 1.5 SAFETY (NEEDLE) ×1 IMPLANT
NS IRRIG 1000ML POUR BTL (IV SOLUTION) ×1 IMPLANT
PACK LAMINECTOMY NEURO (CUSTOM PROCEDURE TRAY) ×1 IMPLANT
PAD ARMBOARD 7.5X6 YLW CONV (MISCELLANEOUS) ×3 IMPLANT
PIN MAYFIELD SKULL DISP (PIN) ×1 IMPLANT
SPIKE FLUID TRANSFER (MISCELLANEOUS) ×1 IMPLANT
SPONGE SURGIFOAM ABS GEL SZ50 (HEMOSTASIS) ×1 IMPLANT
SPONGE T-LAP 4X18 ~~LOC~~+RFID (SPONGE) IMPLANT
STAPLER SKIN PROX WIDE 3.9 (STAPLE) ×1 IMPLANT
STRIP CLOSURE SKIN 1/2X4 (GAUZE/BANDAGES/DRESSINGS) IMPLANT
SUT ETHILON 3 0 FSL (SUTURE) IMPLANT
SUT VIC AB 0 CT1 18XCR BRD8 (SUTURE) ×1 IMPLANT
SUT VIC AB 0 CT1 8-18 (SUTURE) ×2
SUT VIC AB 2-0 CT1 18 (SUTURE) ×1 IMPLANT
SUT VICRYL 3-0 RB1 18 ABS (SUTURE) ×1 IMPLANT
TOWEL GREEN STERILE (TOWEL DISPOSABLE) ×1 IMPLANT
TOWEL GREEN STERILE FF (TOWEL DISPOSABLE) ×1 IMPLANT
UNDERPAD 30X36 HEAVY ABSORB (UNDERPADS AND DIAPERS) ×1 IMPLANT
WATER STERILE IRR 1000ML POUR (IV SOLUTION) ×1 IMPLANT

## 2022-04-16 NOTE — H&P (Signed)
Chief Complaint   Gait instability  History of Present Illness  Jesse [name] is a 81 y.o. male initially seen in the outpatient neurosurgery clinic as a consult from the patient's neurologist.  Patient describes progressive symptoms of cervical myelopathy over the last year.  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  Over the last 12 months he has noted progressively worsening balance.  He has also noted worsening clumsiness with his hands and loss of upper extremity strength.  At this point he does not complain of any bladder dysfunction.  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  Past Medical History   Past Medical History:  Diagnosis Date   Angina pectoris ([place]) 02/03/2015   CAD (coronary artery disease), native coronary artery 08/30/2018   2007 [place], [place]: LIMA to LAD, Occluded SVG to RCA and OM by angiogram S/P  PCI/orbital atherectomy S/P 2.5x15 Resoluute to prox Cx on 11/29/2017.   Coronary artery disease    High cholesterol    Hypertension    Myocardial infarction [facility]) 2017/2018?   NSTEMI (non-ST elevated myocardial infarction) ([place]) 02/03/2015   Presence of permanent cardiac pacemaker    Medtronic   Type II diabetes mellitus [facility])     Past Surgical History   Past Surgical History:  Procedure Laterality Date   CARDIAC CATHETERIZATION N/A 02/04/2015   Procedure: Left Heart Cath and Cors/Grafts Angiography;  Surgeon: Adrian Prows, MD;  Location: [place] [facility];  Service: Cardiovascular;  Laterality: N/A;   CARDIAC CATHETERIZATION N/A 02/04/2015   Procedure: Coronary Stent Intervention;  Surgeon: Adrian Prows, MD;  Location: [place] [facility];  Service: Cardiovascular;  Laterality: N/A;   COLONOSCOPY     several   CORONARY ANGIOGRAPHY N/A 10/18/2017   Procedure: CORONARY ANGIOGRAPHY;  Surgeon: Adrian Prows, MD;  Location: [place] [facility];   Service: Cardiovascular;  Laterality: N/A;   CORONARY ANGIOPLASTY     CORONARY ANGIOPLASTY WITH STENT PLACEMENT     "i've got 5-6 stents total" (08/30/2017)   CORONARY ARTERY BYPASS GRAFT  2007   CABG X3   CORONARY ATHERECTOMY N/A 10/18/2017   Procedure: CORONARY ATHERECTOMY;  Surgeon: Adrian Prows, MD;  Location: [place] [facility];  Service: Cardiovascular;  Laterality: N/A;   CORONARY ATHERECTOMY N/A 11/29/2017   Procedure: CORONARY ATHERECTOMY;  Surgeon: Adrian Prows, MD;  Location: [place] [facility];  Service: Cardiovascular;  Laterality: N/A;   CORONARY CTO INTERVENTION N/A 08/30/2017   Procedure: CORONARY CTO INTERVENTION;  Surgeon: Adrian Prows, MD;  Location: [place] [facility];  Service: Cardiovascular;  Laterality: N/A;   CORONARY STENT INTERVENTION N/A 11/29/2017   Procedure: CORONARY STENT INTERVENTION;  Surgeon: Adrian Prows, MD;  Location: [place] [facility];  Service: Cardiovascular;  Laterality: N/A;   INSERT / REPLACE / REMOVE PACEMAKER  ?2012   Now currently has Medtronic Pacemaker.  "I think it was placed in 2012"   KNEE ARTHROSCOPY Left 04/03/2020   Procedure: LEFT KNEE ARTHROSCOPY  WASHOUT;  Surgeon: Tania Ade, MD;  Location: [facility];  Service: Orthopedics;  Laterality: Left;   LEFT HEART CATH AND CORONARY ANGIOGRAPHY N/A 08/30/2017   Procedure: LEFT HEART CATH AND CORONARY ANGIOGRAPHY;  Surgeon: Adrian Prows, MD;  Location: [place] [facility];  Service: Cardiovascular;  Laterality: N/A;   LEFT HEART CATH AND CORS/GRAFTS ANGIOGRAPHY N/A 08/05/2017   Procedure: LEFT HEART CATH AND CORS/GRAFTS ANGIOGRAPHY;  Surgeon:  Adrian Prows, MD;  Location: [place] [facility];  Service: Cardiovascular;  Laterality: N/A;   LEFT HEART CATH AND CORS/GRAFTS ANGIOGRAPHY N/A 06/30/2021   Procedure: LEFT HEART CATH AND CORS/GRAFTS ANGIOGRAPHY;  Surgeon: Adrian Prows, MD;  Location: [place] [facility];  Service: Cardiovascular;  Laterality: N/A;   PPM GENERATOR CHANGEOUT N/A 06/17/2021   Procedure:  PPM GENERATOR CHANGEOUT;  Surgeon: Evans Lance, MD;  Location: [place] [facility];  Service: Cardiovascular;  Laterality: N/A;    Social History   Social History   Tobacco Use   Smoking status: Former    Packs/day: 0.50    Years: 3.00    Total pack years: 1.50    Types: Cigarettes    Quit date: 1960    Years since quitting: 63.7   Smokeless tobacco: Never  Vaping Use   Vaping Use: Never used  Substance Use Topics   Alcohol use: No   Drug use: No    Medications   Prior to Admission medications   Medication Sig Start Date End Date Taking? Authorizing Provider  Biotin 5000 MCG TABS Take 5,000 mcg by mouth daily.   Yes [provider]  dorzolamide-timolol (COSOPT) 22.3-6.8 MG/ML ophthalmic solution Place 1 drop into both eyes 2 (two) times daily. 05/22/17  Yes [provider]  ezetimibe (ZETIA) 10 MG tablet TAKE 1 TABLET BY MOUTH EVERY DAY AFTER SUPPER 01/19/21  Yes Adrian Prows, MD  hydrocortisone cream 1 % Apply 1 application topically 2 (two) times daily as needed for itching.   Yes [provider]  isosorbide mononitrate (IMDUR) 30 MG 24 hr tablet Take 1 tablet (30 mg total) by mouth daily. 09/23/21 10/28/22 Yes Adrian Prows, MD  latanoprost (XALATAN) 0.005 % ophthalmic solution Place 1 drop into both eyes at bedtime. 06/04/17  Yes [provider]  losartan (COZAAR) 25 MG tablet TAKE 1 TABLET BY MOUTH DAILY 02/12/21  Yes Adrian Prows, MD  metFORMIN (GLUCOPHAGE-XR) 500 MG 24 hr tablet Take by mouth. 1 tablet in the morning and 2 tablets in the evening 10/12/21  Yes [provider]  metoprolol tartrate (LOPRESSOR) 25 MG tablet TAKE 1 TABLET BY MOUTH TWICE DAILY 05/13/21  Yes Adrian Prows, MD  nitroGLYCERIN (NITROSTAT) 0.4 MG SL tablet Place 1 tablet (0.4 mg total) under the tongue every 5 (five) minutes as needed for chest pain. 01/24/20  Yes Adrian Prows, MD  rosuvastatin (CRESTOR) 20 MG tablet Take 20 mg by mouth at bedtime. 06/18/20  Yes [provider]  STUDY - ASPIRE - apixaban 5 mg or placebo tablet (PI-Sethi) Take by mouth 2 (two) times daily.   Yes [provider]  vitamin B-12 (CYANOCOBALAMIN) 1000 MCG tablet Take 1,000 mcg by mouth daily.   Yes [provider]  VITAMIN D, CHOLECALCIFEROL, PO Take 10,000 Units by mouth daily. 10/15/21  Yes [provider]  TRUE METRIX BLOOD GLUCOSE TEST test strip 2 (two) times daily. 07/10/18   [provider]  XARELTO 20 MG TABS tablet TAKE 1 TABLET(20 MG) BY MOUTH DAILY WITH SUPPER 12/13/19   Adrian Prows, MD    Allergies   Allergies  Allergen Reactions   Brilinta [Ticagrelor] Anaphylaxis and Shortness Of Breath    Difficulty breathing     Review of Systems  ROS  Neurologic Exam  Awake, alert, oriented Memory and concentration grossly intact Speech fluent, appropriate CN grossly intact Motor exam: Upper Extremities Deltoid Bicep Tricep Grip  Right 4/5 -->     Left 4/5 -->  Lower Extremities IP Quad PF DF EHL  Right 5/5 5/5 5/5 5/5 5/5  Left 5/5 5/5 5/5 5/5 5/5   Sensation grossly intact to LT  Imaging  MRI of the cervical spine dated 03/19/2022 was personally reviewed.  This demonstrates maintenance of cervical lordosis.  Primary finding is at C3-4 and C4-5 where at both levels there is significant thickening and thickening of the ligamentum flavum posteriorly as well as severe bilateral facet arthropathy.  These contribute to severe central spinal stenosis with spinal cord compression.  There does appear to be some intrinsic T2 signal change within the spinal cord at C3-4.  Impression  - 81 y.o. 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  We have discussed the associated risks, benefits, and alternatives to surgery.  All questions were answered and he provided informed consent to proceed.   Consuella Lose, MD [facility]

## 2022-04-16 NOTE — Anesthesia Postprocedure Evaluation (Signed)
Anesthesia Post Note  Patient: Jesse [name]  Procedure(s) Performed: POSTERIOR CERVICAL LAMINECTOMY, CERVICAL THREE-CERVICAL FOUR     Patient location during evaluation: PACU Anesthesia Type: General Level of consciousness: awake and alert Pain management: pain level controlled Vital Signs Assessment: post-procedure vital signs reviewed and stable Respiratory status: spontaneous breathing, nonlabored ventilation, respiratory function stable and patient connected to nasal cannula oxygen Cardiovascular status: blood pressure returned to baseline and stable Postop Assessment: no apparent nausea or vomiting Anesthetic complications: no   No notable events documented.  Last Vitals:  Vitals:   04/16/22 1100 04/16/22 1126  BP: (!) 128/56 137/64  Pulse: 60 71  Resp: 16 16  Temp:    SpO2: 98% 98%    Last Pain:  Vitals:   04/16/22 1100  TempSrc:   PainSc: [place]

## 2022-04-16 NOTE — Anesthesia Procedure Notes (Signed)
Procedure Name: Intubation Date/Time: 04/16/2022 7:57 AM  Performed by: Harden Mo, CRNAPre-anesthesia Checklist: Patient identified, Emergency Drugs available, Suction available and Patient being monitored Patient Re-evaluated:Patient Re-evaluated prior to induction Oxygen Delivery Method: Circle System Utilized Preoxygenation: Pre-oxygenation with 100% oxygen Induction Type: IV induction Ventilation: Mask ventilation without difficulty and Oral airway inserted - appropriate to patient size Laryngoscope Size: Glidescope and 4 Grade View: Grade I Tube type: Oral Tube size: 7.5 mm Number of attempts: 1 Airway Equipment and Method: Stylet and Oral airway Placement Confirmation: ETT inserted through vocal cords under direct vision, positive ETCO2 and breath sounds checked- equal and bilateral Secured at: 23 cm Tube secured with: Tape Dental Injury: Teeth and Oropharynx as per pre-operative assessment

## 2022-04-16 NOTE — Op Note (Signed)
  NEUROSURGERY OPERATIVE NOTE   PREOP DIAGNOSIS:  Cervical spinal stenosis with myelopathy, C3-4, C4-5   POSTOP DIAGNOSIS: Same  PROCEDURE: C3-C5 laminectomy for decompression of spinal cord  SURGEON: Dr. Consuella Lose, MD  ASSISTANT: Dr. Duffy Rhody, MD  ANESTHESIA: General Endotracheal  EBL: 20cc  SPECIMENS: None  DRAINS: None  COMPLICATIONS: None immediate  CONDITION: Hemodynamically stable to PACU  HISTORY: Jesse [name] is a 81 y.o. male initially seen in the outpatient neurosurgery clinic as a referral from neurology for progressive cervical myelopathy.  Patient's imaging did reveal significant cervical spinal stenosis with spinal cord compression and T2 signal change at C3-4 and C4-5.  Surgical decompression was therefore recommended.  The risks, benefits, and alternatives to surgery were all reviewed in detail with the patient and his daughter.  After all questions were answered informed consent was obtained and witnessed.  PROCEDURE IN DETAIL: The patient was brought to the operating room. After induction of general anesthesia, the patient was positioned on the operative table in the [place] head holder in the prone position. All pressure points were meticulously padded.  Care was taken to ensure neutral cervical alignment.  Skin incision was then marked out and prepped and draped in the usual sterile fashion.  After timeout was conducted, midline skin incision was infiltrated with local anesthetic with epinephrine.  Incision was then made sharply and carried down through the subcutaneous fat.  Local fascia was incised in the midline avascular plane.  Spinous processes were identified.  Large bifid spinous process was identified presumably C2.  Tablet was placed and lateral fluoroscopy confirmed location.  Subperiosteal dissection was then carried out along the C3, C4, and superior aspect of C5 lamina.  Self-retaining retractors were then placed.  High-speed drill  was then used to complete a C4 laminectomy as well as the inferior half of C3 and the superior half of C5.  Ligamentum flavum was identified and noted to be significantly thickened at the C3-4 and C4-5 interspaces.  Ligamentum flavum was then carefully cut with the endplate Kerrisons.  The laminectomy was then removed en bloc.  Kerrison punches were then used to remove the remaining ligamentum flavum and widen the laterally in order to fully decompress the thecal sac.  At this point hemostasis was secured with a combination of bipolar electrocautery and morselized Gelfoam and thrombin.  The wound was then irrigated with copious amount of normal saline irrigation.  Wound was then closed in multiple layers using a combination of interrupted 0 Vicryl stitches.  Skin was closed with interrupted 3 subcuticular stitch.  Dermabond was placed.  Honeycomb dressing was placed.  Patient was then transferred to the stretcher and the [place] head holder was removed.  At the end of the case all sponge, needle, instrument, and cottonoid counts were correct.  Patient was then extubated and taken to the postanesthesia care unit in stable hemodynamic condition.    Consuella Lose, MD [facility]

## 2022-04-16 NOTE — Care Management Obs Status (Signed)
[facility] NOTIFICATION   Patient Details  Name: Jesse [name] MRN: [id number] Date of Birth: [birth year]   Medicare Observation Status Notification Given:  Yes    Pollie Friar, RN 04/16/2022, 4:02 PM

## 2022-04-16 NOTE — Care Management CC44 (Signed)
Condition Code 44 Documentation Completed  Patient Details  Name: Jesse [name] MRN: [id number] Date of Birth: [birth year]   Condition Code 44 given:  Yes Patient signature on Condition Code 44 notice:  Yes Documentation of 2 MD's agreement:  Yes Code 44 added to claim:  Yes    Pollie Friar, RN 04/16/2022, 4:02 PM

## 2022-04-16 NOTE — Transfer of Care (Signed)
Immediate Anesthesia Transfer of Care Note  Patient: Jesse [name]  Procedure(s) Performed: POSTERIOR CERVICAL LAMINECTOMY, CERVICAL THREE-CERVICAL FOUR  Patient Location: PACU  Anesthesia Type:General  Level of Consciousness: awake and alert   Airway & Oxygen Therapy: Patient Spontanous Breathing  Post-op Assessment: Report given to RN, Post -op Vital signs reviewed and stable and Patient moving all extremities X 4  Post vital signs: Reviewed and stable  Last Vitals:  Vitals Value Taken Time  BP 94/52 04/16/22 0939  Temp    Pulse 60 04/16/22 0942  Resp 14 04/16/22 0942  SpO2 100 % 04/16/22 0942  Vitals shown include unvalidated device data.  Last Pain:  Vitals:   04/16/22 0617  TempSrc:   PainSc: 0-No pain         Complications: No notable events documented.

## 2022-04-16 NOTE — Progress Notes (Signed)
Jesse [name], medtronic rep at bedside programming pacemaker.   Francine Graven, RN

## 2022-04-16 NOTE — Progress Notes (Signed)
Orthopedic Tech Progress Note Patient Details:  Jesse [name] [birth year] [id number]  Ortho Devices Type of Ortho Device: Soft collar Ortho Device/Splint Interventions: Ordered, Application, Adjustment   Post Interventions Patient Tolerated: Well Instructions Provided: Adjustment of device, Care of device  [name] 04/16/2022, 11:54 AM

## 2022-04-17 ENCOUNTER — Encounter (HOSPITAL_COMMUNITY): Payer: Self-pay | Admitting: Neurosurgery

## 2022-04-17 DIAGNOSIS — I25119 Atherosclerotic heart disease of native coronary artery with unspecified angina pectoris: Secondary | ICD-10-CM | POA: Diagnosis not present

## 2022-04-17 DIAGNOSIS — M50021 Cervical disc disorder at C4-C5 level with myelopathy: Secondary | ICD-10-CM | POA: Diagnosis not present

## 2022-04-17 DIAGNOSIS — E119 Type 2 diabetes mellitus without complications: Secondary | ICD-10-CM | POA: Diagnosis not present

## 2022-04-17 DIAGNOSIS — M4802 Spinal stenosis, cervical region: Secondary | ICD-10-CM | POA: Diagnosis not present

## 2022-04-17 DIAGNOSIS — Z955 Presence of coronary angioplasty implant and graft: Secondary | ICD-10-CM | POA: Diagnosis not present

## 2022-04-17 DIAGNOSIS — M5002 Cervical disc disorder with myelopathy, mid-cervical region, unspecified level: Secondary | ICD-10-CM | POA: Diagnosis not present

## 2022-04-17 DIAGNOSIS — Z950 Presence of cardiac pacemaker: Secondary | ICD-10-CM | POA: Diagnosis not present

## 2022-04-17 DIAGNOSIS — I10 Essential (primary) hypertension: Secondary | ICD-10-CM | POA: Diagnosis not present

## 2022-04-17 DIAGNOSIS — Z951 Presence of aortocoronary bypass graft: Secondary | ICD-10-CM | POA: Diagnosis not present

## 2022-04-17 LAB — GLUCOSE, CAPILLARY: Glucose-Capillary: 174 mg/dL — ABNORMAL HIGH (ref 70–99)

## 2022-04-17 MED ORDER — HYDROCODONE-ACETAMINOPHEN 5-325 MG PO TABS: 1.0000 | ORAL_TABLET | ORAL | 0 refills | Status: DC | PRN

## 2022-04-17 MED ORDER — METHOCARBAMOL 750 MG PO TABS: 750.0000 mg | ORAL_TABLET | Freq: Three times a day (TID) | ORAL | 2 refills | Status: DC | PRN

## 2022-04-17 NOTE — Discharge Instructions (Signed)
Wound Care Remove dressing in 3 days Leave incision open to air. You may shower. Do not scrub directly on incision.  Do not put any creams, lotions, or ointments on incision. Activity Walk each and every day, increasing distance each day. No lifting greater than 8 lbs.  Avoid excessive neck motion. No driving for 2 weeks; may ride as a passenger locally. Wear neck brace at all times except when showering.  If provided soft collar, may wear for comfort unless otherwise instructed. Diet Resume your normal diet.   Call Your Doctor If Any of These Occur Redness, drainage, or swelling at the wound.  Temperature greater than 101 degrees. Severe pain not relieved by pain medication. Increased difficulty swallowing. Incision starts to come apart. Follow Up Appt

## 2022-04-17 NOTE — Progress Notes (Signed)
PT Cancellation Note  Patient Details Name: Jesse [name] MRN: [id number] DOB: 1941/03/12   Cancelled Treatment:    Reason Eval/Treat Not Completed: PT screened, no needs identified, will sign off Per OT, no skilled PT needs. If needs change, please re-consult.   Jesse [name], DPT  Acute Rehabilitation Services  Office: [phone]    Rudean Hitt 04/17/2022, 8:50 AM

## 2022-04-17 NOTE — Evaluation (Signed)
Occupational Therapy Evaluation Patient Details Name: Jesse [name] MRN: [id number] DOB: 1941/03/31 Today's Date: 04/17/2022   History of Present Illness 81 yo M s/p ACDF.  PMH includes: CAD, CABG, CKD, HTN   Clinical Impression   Patient admitted for the diagnosis above.  PTA he lives with his spouse and remains independent with mobility, ADL and iADL.  Patient is very close to his baseline, and does not need any DME, or post acute rehab.  All questions answered and precautions reviewed.  No further acute care needs.       Recommendations for follow up therapy are one component of a multi-disciplinary discharge planning process, led by the attending physician.  Recommendations may be updated based on patient status, additional functional criteria and insurance authorization.   Follow Up Recommendations  No OT follow up    Assistance Recommended at Discharge Set up Supervision/Assistance  Patient can return home with the following Assist for transportation    Functional Status Assessment  Patient has not had a recent decline in their functional status  Equipment Recommendations  None recommended by OT    Recommendations for Other Services       Precautions / Restrictions Precautions Precautions: Cervical Precaution Booklet Issued: Yes (comment) Required Braces or Orthoses: Cervical Brace Cervical Brace: Soft collar;For comfort Restrictions Weight Bearing Restrictions: No      Mobility Bed Mobility Overal bed mobility: Modified Independent                  Transfers Overall transfer level: Modified independent                        Balance Overall balance assessment: Mild deficits observed, not formally tested                                         ADL either performed or assessed with clinical judgement   ADL Overall ADL's : At baseline                                             Vision Baseline  Vision/History: 1 Wears glasses Patient Visual Report: No change from baseline       Perception Perception Perception: Within Functional Limits   Praxis Praxis Praxis: Intact    Pertinent Vitals/Pain Pain Assessment Pain Assessment: Faces Faces Pain Scale: Hurts a little bit Pain Location: neck Pain Descriptors / Indicators: Tender Pain Intervention(s): Monitored during session     Hand Dominance     Extremity/Trunk Assessment Upper Extremity Assessment Upper Extremity Assessment: Generalized weakness   Lower Extremity Assessment Lower Extremity Assessment: Overall WFL for tasks assessed   Cervical / Trunk Assessment Cervical / Trunk Assessment: Neck Surgery   Communication Communication Communication: No difficulties   Cognition Arousal/Alertness: Awake/alert Behavior During Therapy: WFL for tasks assessed/performed Overall Cognitive Status: Within Functional Limits for tasks assessed                                       General Comments       Exercises     Shoulder Instructions      Home Living Family/patient expects to be discharged to:: Private residence  Living Arrangements: Spouse/significant other Available Help at Discharge: Family;Available 24 hours/day Type of Home: House Home Access: Stairs to enter [employer] of Steps: 3 Entrance Stairs-Rails: Can reach both Home Layout: Two level;Able to live on main level with bedroom/bathroom     Bathroom Shower/Tub: Occupational psychologist: Standard Bathroom Accessibility: Yes   Home Equipment: Conservation officer, nature (2 wheels)          Prior Functioning/Environment Prior Level of Function : Independent/Modified Independent;Driving                        OT Problem List: Decreased strength      OT Treatment/Interventions:      OT Goals(Current goals can be found in the care plan section) Acute Rehab OT Goals Patient Stated Goal: Return home OT Goal  Formulation: With patient Time For Goal Achievement: 04/19/22 Potential to Achieve Goals: Good  OT Frequency:      Co-evaluation              AM-PAC OT "6 Clicks" Daily Activity     Outcome Measure Help from another person eating meals?: None Help from another person taking care of personal grooming?: None Help from another person toileting, which includes using toliet, bedpan, or urinal?: None Help from another person bathing (including washing, rinsing, drying)?: A Little Help from another person to put on and taking off regular upper body clothing?: None Help from another person to put on and taking off regular lower body clothing?: A Little 6 Click Score: 22   End of Session Equipment Utilized During Treatment: Rolling walker (2 wheels) Nurse Communication: Mobility status  Activity Tolerance: Patient tolerated treatment well Patient left: in chair;with call bell/phone within reach  OT Visit Diagnosis: Unsteadiness on feet (R26.81);Muscle weakness (generalized) (M62.81)                Time: 0272-5366 OT Time Calculation (min): 23 min Charges:  OT General Charges $OT Visit: 1 Visit OT Evaluation $OT Eval Moderate Complexity: 1 Mod OT Treatments $Self Care/Home Management : 8-22 mins  04/17/2022  RP, OTR/L  Acute Rehabilitation Services  Office:  [phone]   Metta Clines 04/17/2022, 8:45 AM

## 2022-04-17 NOTE — Progress Notes (Signed)
Patient discharged home with daughter. Discharged instructions given  and all belongings with patient at discharge.

## 2022-04-17 NOTE — Discharge Summary (Signed)
Physician Discharge Summary  Patient ID: Jesse [name] MRN: [id number] DOB/AGE: 09/14/1940 81 y.o.  Admit date: 04/16/2022 Discharge date: 04/17/2022  Admission Diagnoses: Cervical spinal stenosis with myelopathy, C3-4, C4-5  Discharge Diagnoses: Cervical spinal stenosis with myelopathy, C3-4, C4-5 Principal Problem:   Stenosis of cervical spine with myelopathy [facility])   Discharged Condition: good  Hospital Course: The patient was admitted on 04/16/2022 and taken to the operating room where the patient underwent C3-C5 laminectomy for decompression of spinal cord. The patient tolerated the procedure well and was taken to the recovery room and then to the floor in stable condition. The hospital course was routine. There were no complications. The wound remained clean dry and intact. Pt had appropriate neck soreness. No complaints of arm pain or new N/T/W. The patient remained afebrile with stable vital signs, and tolerated a regular diet. The patient continued to increase activities, and pain was well controlled with oral pain medications.   Consults: None  Significant Diagnostic Studies: radiology: X-Ray: intraoperative   Treatments: surgery: C3-C5 laminectomy for decompression of spinal cord  Discharge Exam: Blood pressure (!) 121/59, pulse 61, temperature 97.9 F (36.6 C), temperature source Oral, resp. rate 20, height '6\' 2"'$  (1.88 m), weight 71.7 kg, SpO2 100 %. Physical Exam: Patient is awake, A/O X 4, conversant, and in good spirits. Eyes open spontaneously. They are in NAD and VSS. Doing well. Speech is fluent and appropriate. MAEW with good strength. PERLA, EOMI. CNs grossly intact. Dressing is clean dry intact. Incision is well approximated with no drainage, erythema, or edema.      Disposition: Discharge disposition: 01-Home or Self Care       Discharge Instructions     Incentive spirometry RT   Complete by: As directed       Allergies as of 04/17/2022        Reactions   Brilinta [ticagrelor] Anaphylaxis, Shortness Of Breath   Difficulty breathing         Medication List     TAKE these medications    apixaban or placebo 5 mg Tabs tablet Take by mouth 2 (two) times daily.   Biotin 5000 MCG Tabs Take 5,000 mcg by mouth daily.   cyanocobalamin 1000 MCG tablet Commonly known as: VITAMIN B12 Take 1,000 mcg by mouth daily.   dorzolamide-timolol 22.3-6.8 MG/ML ophthalmic solution Commonly known as: COSOPT Place 1 drop into both eyes 2 (two) times daily.   ezetimibe 10 MG tablet Commonly known as: ZETIA TAKE 1 TABLET BY MOUTH EVERY DAY AFTER SUPPER   HYDROcodone-acetaminophen 5-325 MG tablet Commonly known as: NORCO/VICODIN Take 1 tablet by mouth every 4 (four) hours as needed for moderate pain.   hydrocortisone cream 1 % Apply 1 application topically 2 (two) times daily as needed for itching.   isosorbide mononitrate 30 MG 24 hr tablet Commonly known as: IMDUR Take 1 tablet (30 mg total) by mouth daily.   latanoprost 0.005 % ophthalmic solution Commonly known as: XALATAN Place 1 drop into both eyes at bedtime.   losartan 25 MG tablet Commonly known as: COZAAR TAKE 1 TABLET BY MOUTH DAILY   metFORMIN 500 MG 24 hr tablet Commonly known as: GLUCOPHAGE-XR Take by mouth. 1 tablet in the morning and 2 tablets in the evening   methocarbamol 750 MG tablet Commonly known as: Robaxin-750 Take 1 tablet (750 mg total) by mouth every 8 (eight) hours as needed for muscle spasms.   metoprolol tartrate 25 MG tablet Commonly known as: LOPRESSOR TAKE 1 TABLET BY  MOUTH TWICE DAILY   nitroGLYCERIN 0.4 MG SL tablet Commonly known as: NITROSTAT Place 1 tablet (0.4 mg total) under the tongue every 5 (five) minutes as needed for chest pain.   rosuvastatin 20 MG tablet Commonly known as: CRESTOR Take 20 mg by mouth at bedtime.   True Metrix Blood Glucose Test test strip Generic drug: glucose blood 2 (two) times daily.   VITAMIN D  (CHOLECALCIFEROL) PO Take 10,000 Units by mouth daily.   Xarelto 20 MG Tabs tablet Generic drug: rivaroxaban TAKE 1 TABLET(20 MG) BY MOUTH DAILY WITH SUPPER        Follow-up Information     Consuella Lose, MD. Call.   Specialty: Neurosurgery Why: As needed, If symptoms worsen Contact information: [address] [place] [address] [phone]                 Signed: Marvis Moeller 04/17/2022, 9:16 AM

## 2022-04-19 MED FILL — Thrombin For Soln 5000 Unit: CUTANEOUS | Qty: 2 | Status: AC

## 2022-04-24 ENCOUNTER — Emergency Department (HOSPITAL_BASED_OUTPATIENT_CLINIC_OR_DEPARTMENT_OTHER): Payer: Medicare HMO

## 2022-04-24 ENCOUNTER — Emergency Department (HOSPITAL_BASED_OUTPATIENT_CLINIC_OR_DEPARTMENT_OTHER)
Admission: EM | Admit: 2022-04-24 | Discharge: 2022-04-24 | Disposition: A | Discharge status: Home / Self Care | Payer: Medicare HMO | Attending: Emergency Medicine | Admitting: Emergency Medicine

## 2022-04-24 ENCOUNTER — Encounter (HOSPITAL_BASED_OUTPATIENT_CLINIC_OR_DEPARTMENT_OTHER): Payer: Self-pay | Admitting: Emergency Medicine

## 2022-04-24 ENCOUNTER — Other Ambulatory Visit: Payer: Self-pay

## 2022-04-24 DIAGNOSIS — I2510 Atherosclerotic heart disease of native coronary artery without angina pectoris: Secondary | ICD-10-CM | POA: Diagnosis not present

## 2022-04-24 DIAGNOSIS — T402X5A Adverse effect of other opioids, initial encounter: Secondary | ICD-10-CM | POA: Diagnosis not present

## 2022-04-24 DIAGNOSIS — Z7901 Long term (current) use of anticoagulants: Secondary | ICD-10-CM | POA: Insufficient documentation

## 2022-04-24 DIAGNOSIS — Z79899 Other long term (current) drug therapy: Secondary | ICD-10-CM | POA: Diagnosis not present

## 2022-04-24 DIAGNOSIS — Z7984 Long term (current) use of oral hypoglycemic drugs: Secondary | ICD-10-CM | POA: Insufficient documentation

## 2022-04-24 DIAGNOSIS — K5903 Drug induced constipation: Secondary | ICD-10-CM | POA: Diagnosis not present

## 2022-04-24 DIAGNOSIS — E119 Type 2 diabetes mellitus without complications: Secondary | ICD-10-CM | POA: Insufficient documentation

## 2022-04-24 DIAGNOSIS — K5900 Constipation, unspecified: Secondary | ICD-10-CM | POA: Diagnosis not present

## 2022-04-24 LAB — CBC WITH DIFFERENTIAL/PLATELET
Abs Immature Granulocytes: 0.04 10*3/uL (ref 0.00–0.07)
Basophils Absolute: 0 10*3/uL (ref 0.0–0.1)
Basophils Relative: 1 %
Eosinophils Absolute: 0 10*3/uL (ref 0.0–0.5)
Eosinophils Relative: 0 %
HCT: 35.6 % — ABNORMAL LOW (ref 39.0–52.0)
Hemoglobin: 11.9 g/dL — ABNORMAL LOW (ref 13.0–17.0)
Immature Granulocytes: 1 %
Lymphocytes Relative: 16 %
Lymphs Abs: 0.8 10*3/uL (ref 0.7–4.0)
MCH: 32.9 pg (ref 26.0–34.0)
MCHC: 33.4 g/dL (ref 30.0–36.0)
MCV: 98.3 fL (ref 80.0–100.0)
Monocytes Absolute: 0.5 10*3/uL (ref 0.1–1.0)
Monocytes Relative: 10 %
Neutro Abs: 3.8 10*3/uL (ref 1.7–7.7)
Neutrophils Relative %: 72 %
Platelets: 142 10*3/uL — ABNORMAL LOW (ref 150–400)
RBC: 3.62 MIL/uL — ABNORMAL LOW (ref 4.22–5.81)
RDW: 11.9 % (ref 11.5–15.5)
WBC: 5.3 10*3/uL (ref 4.0–10.5)
nRBC: 0 % (ref 0.0–0.2)

## 2022-04-24 LAB — COMPREHENSIVE METABOLIC PANEL
ALT: 28 U/L (ref 0–44)
AST: 41 U/L (ref 15–41)
Albumin: 4.1 g/dL (ref 3.5–5.0)
Alkaline Phosphatase: 56 U/L (ref 38–126)
Anion gap: 14 (ref 5–15)
BUN: 29 mg/dL — ABNORMAL HIGH (ref 8–23)
CO2: 21 mmol/L — ABNORMAL LOW (ref 22–32)
Calcium: 9.7 mg/dL (ref 8.9–10.3)
Chloride: 102 mmol/L (ref 98–111)
Creatinine, Ser: 0.99 mg/dL (ref 0.61–1.24)
GFR, Estimated: >60 mL/min (ref >60)
Glucose, Bld: 119 mg/dL — ABNORMAL HIGH (ref 70–99)
Potassium: 5.7 mmol/L — ABNORMAL HIGH (ref 3.5–5.1)
Sodium: 137 mmol/L (ref 135–145)
Total Bilirubin: 0.9 mg/dL (ref 0.3–1.2)
Total Protein: 7.1 g/dL (ref 6.5–8.1)

## 2022-04-24 LAB — URINALYSIS, ROUTINE W REFLEX MICROSCOPIC
Bilirubin Urine: NEGATIVE
Glucose, UA: >1000 mg/dL — AB
Hgb urine dipstick: NEGATIVE
Ketones, ur: 15 mg/dL — AB
Leukocytes,Ua: NEGATIVE
Nitrite: NEGATIVE
Protein, ur: NEGATIVE mg/dL
Specific Gravity, Urine: 1.028 (ref 1.005–1.030)
pH: 5 (ref 5.0–8.0)

## 2022-04-24 LAB — LIPASE, BLOOD: Lipase: 31 U/L (ref 11–51)

## 2022-04-24 LAB — POTASSIUM: Potassium: 4.3 mmol/L (ref 3.5–5.1)

## 2022-04-24 MED ORDER — FLEET ENEMA 7-19 GM/118ML RE ENEM
1.0000 | ENEMA | Freq: Once | RECTAL | Status: AC
  Administered 2022-04-24: 1 via RECTAL
  Filled 2022-04-24: qty 1

## 2022-04-24 MED ORDER — POLYETHYLENE GLYCOL 3350 17 G PO PACK: 17.0000 g | PACK | Freq: Every day | ORAL | 0 refills | Status: AC

## 2022-04-24 MED ORDER — SORBITOL 70 % SOLN: 960.0000 mL | TOPICAL_OIL | Freq: Once | ORAL | Status: DC

## 2022-04-24 MED ORDER — GLYCERIN (LAXATIVE) 2.1 G RE SUPP: 1.0000 | Freq: Once | RECTAL | Status: DC

## 2022-04-24 MED ORDER — GLYCERIN (ADULT) 2 G RE SUPP: 1.0000 | RECTAL | 0 refills | Status: DC | PRN

## 2022-04-24 NOTE — Discharge Instructions (Addendum)
Your symptoms are concerning for likely opiate-induced constipation although it is not necessarily abnormal to not have a bowel movement in 2 days.  Your abdomen is soft, nontender and nondistended and you had no evidence of fecal impaction on exam.  You can try glycerin suppositories at home as well as at home enemas to assist you have a bowel movement while taking opiates. Return to the ED if you develop new or worsening symptoms

## 2022-04-24 NOTE — ED Triage Notes (Signed)
Neck surgery last Friday,week ago. Stayed overnight. Taking hydrocodone for pain ,every 4 hours. Pt has not been on any stool softener. Pt hasnt taken any laxatives, cant seem to answer why he hasnt/

## 2022-04-24 NOTE — ED Notes (Signed)
Pt had a very small amount of liquid brown stool in bedpan after receiving enema.  Pt still says he feels like he "still needs to have BM, but nothing is coming out".

## 2022-04-24 NOTE — ED Provider Notes (Signed)
Care assumed from Dr. Armandina Gemma.  Patient here with constipation 8 days after C-spine surgery.  No abdominal pain or vomiting.  Patient only passed small liquid brown stool after enema.  On exam he has a large volume impaction which was digitally removed by myself.  Fecal disimpaction  Date/Time: 04/24/2022 4:52 PM  Performed by: Ezequiel Essex, MD Authorized by: Ezequiel Essex, MD  Consent: Verbal consent obtained. Risks and benefits: risks, benefits and alternatives were discussed Consent given by: patient Patient understanding: patient states understanding of the procedure being performed Patient consent: the patient's understanding of the procedure matches consent given Procedure consent: procedure consent matches procedure scheduled Relevant documents: relevant documents present and verified Test results: test results available and properly labeled Site marked: the operative site was marked Imaging studies: imaging studies available Patient identity confirmed: verbally with patient Time out: Immediately prior to procedure a "time out" was called to verify the correct patient, procedure, equipment, support staff and site/side marked as required. Local anesthesia used: no  Anesthesia: Local anesthesia used: no Patient tolerance: patient tolerated the procedure well with no immediate complications    Patient improved after disimpaction as above.  Abdomen soft without peritoneal signs.  He was given glycerin suppositories.  Previous physician.  We will add MiraLAX and Colace.  Increase hydration at home.  Labs are reassuring.  Hyperkalemia was likely spurious and improved on recheck.  Follow-up with his surgeon.  Return precautions discussed   Ezequiel Essex, MD 04/24/22 [phone]

## 2022-04-24 NOTE — ED Provider Notes (Signed)
[facility] EMERGENCY DEPT Provider Note   CSN: [id number] Arrival date & time: 04/24/22  1134     History  Chief Complaint  Patient presents with   Constipation    Jesse [name] is a 81 y.o. male.   Constipation    81 year old male with medical history significant for CAD,  HLD, DM 2, recent posterior cervical laminectomy at C3-C5 for decompression of the spinal cord with Dr. Kathyrn Sheriff of neurosurgery who presents to the emergency department with constipation.  The patient states that he has had a bowel movement since surgery.  He denies any fecal or urinary incontinence.  He is on opiates for pain control.  He states that he has had difficulty initiating a bowel movement in the past 2 days.  He states he normally has a bowel movement every day.  He states that he is passing gas.  He denies any abdominal pain, nausea, vomiting, diarrhea or dysuria.  He denies any new weakness or numbness.  He has not tried any stool softeners or laxatives at home.  Home Medications Prior to Admission medications   Medication Sig Start Date End Date Taking? Authorizing Provider  glycerin adult 2 g suppository Place 1 suppository rectally as needed for constipation. 04/24/22  Yes Regan Lemming, MD  Biotin 5000 MCG TABS Take 5,000 mcg by mouth daily.    [provider]  dorzolamide-timolol (COSOPT) 22.3-6.8 MG/ML ophthalmic solution Place 1 drop into both eyes 2 (two) times daily. 05/22/17   [provider]  ezetimibe (ZETIA) 10 MG tablet TAKE 1 TABLET BY MOUTH EVERY DAY AFTER SUPPER 01/19/21   Adrian Prows, MD  HYDROcodone-acetaminophen (NORCO/VICODIN) 5-325 MG tablet Take 1 tablet by mouth every 4 (four) hours as needed for moderate pain. 04/17/22 04/17/23  Marvis Moeller, NP  hydrocortisone cream 1 % Apply 1 application topically 2 (two) times daily as needed for itching.    [provider]  isosorbide mononitrate (IMDUR) 30 MG 24 hr tablet Take 1 tablet (30 mg  total) by mouth daily. 09/23/21 10/28/22  Adrian Prows, MD  latanoprost (XALATAN) 0.005 % ophthalmic solution Place 1 drop into both eyes at bedtime. 06/04/17   [provider]  losartan (COZAAR) 25 MG tablet TAKE 1 TABLET BY MOUTH DAILY 02/12/21   Adrian Prows, MD  metFORMIN (GLUCOPHAGE-XR) 500 MG 24 hr tablet Take by mouth. 1 tablet in the morning and 2 tablets in the evening 10/12/21   [provider]  methocarbamol (ROBAXIN-750) 750 MG tablet Take 1 tablet (750 mg total) by mouth every 8 (eight) hours as needed for muscle spasms. 04/17/22   Marvis Moeller, NP  metoprolol tartrate (LOPRESSOR) 25 MG tablet TAKE 1 TABLET BY MOUTH TWICE DAILY 05/13/21   Adrian Prows, MD  nitroGLYCERIN (NITROSTAT) 0.4 MG SL tablet Place 1 tablet (0.4 mg total) under the tongue every 5 (five) minutes as needed for chest pain. 01/24/20   Adrian Prows, MD  rosuvastatin (CRESTOR) 20 MG tablet Take 20 mg by mouth at bedtime. 06/18/20   [provider]  STUDY - ASPIRE - apixaban 5 mg or placebo tablet (PI-Sethi) Take by mouth 2 (two) times daily.    [provider]  TRUE METRIX BLOOD GLUCOSE TEST test strip 2 (two) times daily. 07/10/18   [provider]  vitamin B-12 (CYANOCOBALAMIN) 1000 MCG tablet Take 1,000 mcg by mouth daily.    [provider]  VITAMIN D, CHOLECALCIFEROL, PO Take 10,000 Units by mouth daily. 10/15/21   [provider]  XARELTO 20 MG TABS tablet TAKE 1 TABLET(20 MG) BY MOUTH DAILY WITH SUPPER 12/13/19   Adrian Prows, MD      Allergies    Brilinta [ticagrelor]    Review of Systems   Review of Systems  Gastrointestinal:  Positive for constipation.  All other systems reviewed and are negative.   Physical Exam Updated Vital Signs BP (!) 148/59 (BP Location: Left Arm)   Pulse 72   Temp 98.1 F (36.7 C)   Resp 18   SpO2 100%  Physical Exam Vitals and nursing note reviewed. Exam conducted with a chaperone present.  Constitutional:      General:  He is not in acute distress.    Appearance: He is well-developed.  HENT:     Head: Normocephalic and atraumatic.  Eyes:     Conjunctiva/sclera: Conjunctivae normal.  Cardiovascular:     Rate and Rhythm: Normal rate and regular rhythm.     Heart sounds: No murmur heard. Pulmonary:     Effort: Pulmonary effort is normal. No respiratory distress.     Breath sounds: Normal breath sounds.  Abdominal:     Palpations: Abdomen is soft.     Tenderness: There is no abdominal tenderness.     Comments: Abdomen soft, nontender, nondistended, no rebound or guarding  Genitourinary:    Comments: No fecal impaction, no significant perirectal or rectal tenderness, no melena or hematochezia Musculoskeletal:        General: No swelling.     Cervical back: Neck supple.  Skin:    General: Skin is warm and dry.     Capillary Refill: Capillary refill takes less than 2 seconds.  Neurological:     Mental Status: He is alert.  Psychiatric:        Mood and Affect: Mood normal.     ED Results / Procedures / Treatments   Labs (all labs ordered are listed, but only abnormal results are displayed) Labs Reviewed  CBC WITH DIFFERENTIAL/PLATELET  COMPREHENSIVE METABOLIC PANEL  LIPASE, BLOOD  URINALYSIS, ROUTINE W REFLEX MICROSCOPIC    EKG None  Radiology DG Abdomen 1 View  Result Date: 04/24/2022 CLINICAL DATA:  Constipation EXAM: ABDOMEN - 1 VIEW COMPARISON:  None Available. FINDINGS: There is no small bowel dilation. Moderate to large amount of stool is seen in colon and rectum. No abnormal masses or calcifications are seen. Vascular calcifications are seen in pelvis. Degenerative changes are noted in lumbar spine. There is mild levoscoliosis. Pacemaker lead is seen in hot. Metallic sutures are seen in sternum. IMPRESSION: Moderate to large amount of stool is seen in colon and rectum. No other significant abnormalities are seen. Electronically Signed   By: Elmer Picker M.D.   On: 04/24/2022  13:09    Procedures Procedures    Medications Ordered in ED Medications  Glycerin (Adult) 2.1 g suppository 1 suppository (1 suppository Rectal Not Given 04/24/22 1454)  sodium phosphate (FLEET) 7-19 GM/118ML enema 1 enema (1 enema Rectal Given 04/24/22 1546)    ED Course/ Medical Decision Making/ A&P                           Medical Decision Making Amount and/or Complexity of Data Reviewed Labs: ordered. Radiology: ordered.  Risk OTC drugs.    81 year old male with medical history significant for CAD,  HLD, DM 2, recent posterior cervical laminectomy at C3-C5 for decompression of the spinal cord with Dr. Kathyrn Sheriff of neurosurgery who presents  to the emergency department with constipation.  The patient states that he has had a bowel movement since surgery.  He denies any fecal or urinary incontinence.  He is on opiates for pain control.  He states that he has had difficulty initiating a bowel movement in the past 2 days.  He states he normally has a bowel movement every day.  He states that he is passing gas.  He denies any abdominal pain, nausea, vomiting, diarrhea or dysuria.  He denies any new weakness or numbness.  He has not tried any stool softeners or laxatives at home.  On arrival, the patient was vitally stable, presenting with likely opiate induced constipation.  No red flag symptoms to suggest recurrent spinal cord compression postoperatively.  The patient has had a bowel movement since surgery and is passing gas.  His abdomen on physical exam is soft, nondistended, nontender.  Symptoms are consistent with likely opiate-induced constipation.  Rectal exam with good rectal tone, no fecal impaction noted.  Will trial a course of an enema in the emergency department.  X-ray of the abdomen revealed moderate to large amount of stool is seen in the colon and rectum with no other significant abnormalities noted.  Feel that patient will likely be stable for discharge following this with  outpatient follow-up with his neurosurgeon.  Recommended that he try a course of stool softeners, glycerin suppositories and possibly at home enemas.  Signout given to Dr. Kingsley Callander pending results of his enema and reassessment.  Signout given at 1530.   Final Clinical Impression(s) / ED Diagnoses Final diagnoses:  Drug-induced constipation    Rx / DC Orders ED Discharge Orders          Ordered    glycerin adult 2 g suppository  As needed        04/24/22 1512              Regan Lemming, MD 04/24/22 1601

## 2022-04-28 ENCOUNTER — Encounter: Payer: Self-pay | Admitting: *Deleted

## 2022-04-29 ENCOUNTER — Telehealth: Payer: Self-pay

## 2022-04-29 NOTE — Telephone Encounter (Signed)
        Patient  visited [facility] on 9/30   Telephone encounter attempt :  1st  A HIPAA compliant voice message was left requesting a return call.  Instructed patient to call back   Jesse [name] [facility] Care Guide, [place], Care Management  [phone] [address], [place], [place] [address] Phone: [phone] Email: [email or web address]       

## 2022-04-30 ENCOUNTER — Encounter: Payer: Self-pay | Admitting: *Deleted

## 2022-05-04 ENCOUNTER — Encounter: Payer: Self-pay | Admitting: *Deleted

## 2022-05-04 ENCOUNTER — Telehealth: Payer: Self-pay | Admitting: *Deleted

## 2022-05-04 NOTE — Patient Outreach (Signed)
  Care Coordination   05/04/2022 Name: Jesse [name] MRN: [id number] DOB: [birth year]   Care Coordination Outreach Attempts:  An unsuccessful telephone outreach was attempted today to offer the patient information about available care coordination services as a benefit of their health plan.   Follow Up Plan:  Additional outreach attempts will be made to offer the patient care coordination information and services.   Encounter Outcome:  No Answer  Care Coordination Interventions Activated:  No   Care Coordination Interventions:  No, not indicated    Eduard Clos MSW, LCSW Licensed Clinical Social Worker      [phone]

## 2022-05-11 ENCOUNTER — Encounter: Payer: Self-pay | Admitting: Rehabilitative and Restorative Service Providers"

## 2022-05-11 ENCOUNTER — Other Ambulatory Visit: Payer: Self-pay

## 2022-05-11 ENCOUNTER — Ambulatory Visit: Payer: Medicare HMO | Attending: Neurosurgery | Admitting: Rehabilitative and Restorative Service Providers"

## 2022-05-11 DIAGNOSIS — R278 Other lack of coordination: Secondary | ICD-10-CM | POA: Insufficient documentation

## 2022-05-11 DIAGNOSIS — R2689 Other abnormalities of gait and mobility: Secondary | ICD-10-CM | POA: Diagnosis not present

## 2022-05-11 DIAGNOSIS — M6281 Muscle weakness (generalized): Secondary | ICD-10-CM | POA: Diagnosis not present

## 2022-05-11 DIAGNOSIS — M542 Cervicalgia: Secondary | ICD-10-CM | POA: Diagnosis not present

## 2022-05-11 DIAGNOSIS — R208 Other disturbances of skin sensation: Secondary | ICD-10-CM | POA: Insufficient documentation

## 2022-05-11 DIAGNOSIS — M4802 Spinal stenosis, cervical region: Secondary | ICD-10-CM | POA: Insufficient documentation

## 2022-05-11 DIAGNOSIS — R252 Cramp and spasm: Secondary | ICD-10-CM | POA: Insufficient documentation

## 2022-05-11 NOTE — Therapy (Signed)
OUTPATIENT PHYSICAL THERAPY CERVICAL EVALUATION   Patient Name: Jesse [name] MRN: [id number] DOB:09/26/1940, 81 y.o., male Today's Date: 05/11/2022   PT End of Session - 05/11/22 1155     Visit Number 1    Date for PT Re-Evaluation 07/02/22    Authorization Type [employer]    Progress Note Due on Visit 10    PT Start Time 1145    PT Stop Time 1225    PT Time Calculation (min) 40 min    Activity Tolerance Patient tolerated treatment well    Behavior During Therapy [facility] for tasks assessed/performed             Past Medical History:  Diagnosis Date   Angina pectoris ([place]) 02/03/2015   CAD (coronary artery disease), native coronary artery 08/30/2018   2007 [place], [place]: LIMA to LAD, Occluded SVG to RCA and OM by angiogram S/P  PCI/orbital atherectomy S/P 2.5x15 Resoluute to prox Cx on 11/29/2017.   Coronary artery disease    High cholesterol    Hypertension    Myocardial infarction [facility]) 2017/2018?   NSTEMI (non-ST elevated myocardial infarction) ([place]) 02/03/2015   Presence of permanent cardiac pacemaker    Medtronic   Type II diabetes mellitus ([place])    Past Surgical History:  Procedure Laterality Date   CARDIAC CATHETERIZATION N/A 02/04/2015   Procedure: Left Heart Cath and Cors/Grafts Angiography;  Surgeon: Adrian Prows, MD;  Location: [place] [facility];  Service: Cardiovascular;  Laterality: N/A;   CARDIAC CATHETERIZATION N/A 02/04/2015   Procedure: Coronary Stent Intervention;  Surgeon: Adrian Prows, MD;  Location: [place] [facility];  Service: Cardiovascular;  Laterality: N/A;   COLONOSCOPY     several   CORONARY ANGIOGRAPHY N/A 10/18/2017   Procedure: CORONARY ANGIOGRAPHY;  Surgeon: Adrian Prows, MD;  Location: [place] [facility];  Service: Cardiovascular;  Laterality: N/A;   CORONARY ANGIOPLASTY     CORONARY ANGIOPLASTY WITH STENT PLACEMENT     "i've got 5-6 stents total" (08/30/2017)   CORONARY ARTERY BYPASS GRAFT  2007   CABG X3   CORONARY ATHERECTOMY N/A  10/18/2017   Procedure: CORONARY ATHERECTOMY;  Surgeon: Adrian Prows, MD;  Location: [place] [facility];  Service: Cardiovascular;  Laterality: N/A;   CORONARY ATHERECTOMY N/A 11/29/2017   Procedure: CORONARY ATHERECTOMY;  Surgeon: Adrian Prows, MD;  Location: [place] [facility];  Service: Cardiovascular;  Laterality: N/A;   CORONARY CTO INTERVENTION N/A 08/30/2017   Procedure: CORONARY CTO INTERVENTION;  Surgeon: Adrian Prows, MD;  Location: [place] [facility];  Service: Cardiovascular;  Laterality: N/A;   CORONARY STENT INTERVENTION N/A 11/29/2017   Procedure: CORONARY STENT INTERVENTION;  Surgeon: Adrian Prows, MD;  Location: [place] [facility];  Service: Cardiovascular;  Laterality: N/A;   INSERT / REPLACE / REMOVE PACEMAKER  ?2012   Now currently has Medtronic Pacemaker.  "I think it was placed in 2012"   KNEE ARTHROSCOPY Left 04/03/2020   Procedure: LEFT KNEE ARTHROSCOPY  WASHOUT;  Surgeon: Tania Ade, MD;  Location: [facility];  Service: Orthopedics;  Laterality: Left;   LEFT HEART CATH AND CORONARY ANGIOGRAPHY N/A 08/30/2017   Procedure: LEFT HEART CATH AND CORONARY ANGIOGRAPHY;  Surgeon: Adrian Prows, MD;  Location: [place] [facility];  Service: Cardiovascular;  Laterality: N/A;   LEFT HEART CATH AND CORS/GRAFTS ANGIOGRAPHY N/A 08/05/2017   Procedure: LEFT HEART CATH AND CORS/GRAFTS ANGIOGRAPHY;  Surgeon: Adrian Prows, MD;  Location: [place] [facility];  Service: Cardiovascular;  Laterality: N/A;   LEFT  HEART CATH AND CORS/GRAFTS ANGIOGRAPHY N/A 06/30/2021   Procedure: LEFT HEART CATH AND CORS/GRAFTS ANGIOGRAPHY;  Surgeon: Adrian Prows, MD;  Location: [place] [facility];  Service: Cardiovascular;  Laterality: N/A;   POSTERIOR CERVICAL LAMINECTOMY N/A 04/16/2022   Procedure: POSTERIOR CERVICAL LAMINECTOMY, CERVICAL THREE-CERVICAL FOUR;  Surgeon: Consuella Lose, MD;  Location: [place];  Service: Neurosurgery;  Laterality: N/A;  3C   PPM GENERATOR CHANGEOUT N/A 06/17/2021   Procedure: PPM GENERATOR  CHANGEOUT;  Surgeon: Evans Lance, MD;  Location: [place] [facility];  Service: Cardiovascular;  Laterality: N/A;   Patient Active Problem List   Diagnosis Date Noted   Stenosis of cervical spine with myelopathy ([place]) 04/16/2022   Enrolled in clinical trial: OCEANIC-AF (Asundexian - factor XIa inhibitor PO BID vs Apixaban PO BID in patients with A. Fib for stroke prevention. 03/26/2022   PAF (paroxysmal atrial fibrillation) ([place]). CHA2DS2-VASc Score is 5 (A, HTN, DM Vasc Dz). 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 12/15/2018   Flu-like symptoms 09/04/2018   Troponin level elevated 09/02/2018   CAD (coronary artery disease), native coronary artery 08/30/2018   Essential hypertension    Mixed hyperlipidemia    Angina pectoris ([place]) 02/03/2015   Post PTCA 02/03/2015   Diabetes mellitus ([place]) 02/03/2015   Sick sinus syndrome [facility])    Pacemaker Medtronic dual chamber AzureT XT DR MRI SureScan Gen change 06/17/21 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    PCP: Deland Pretty, MD  REFERRING PROVIDER: Consuella Lose, MD   REFERRING DIAG: [phone] (ICD-10-CM) - Spinal stenosis, cervical region   THERAPY DIAG:  Cervicalgia - Plan: PT plan of care cert/re-cert  Muscle weakness (generalized) - Plan: PT plan of care cert/re-cert  Other abnormalities of gait and mobility - Plan: PT plan of care cert/re-cert  Cramp and spasm - Plan: PT plan of care cert/re-cert  Rationale for Evaluation and Treatment Rehabilitation  ONSET DATE: s/p cervical laminectomy on 04/16/2022  SUBJECTIVE:                                                                                                                                                                                                          SUBJECTIVE STATEMENT: Pt with diagnosis of cervical spinal stenosis and s/p cervical laminectomy of 04/16/22.  Since surgery, pt reports that his balance and ambulation has improved some,  but he is still having some difficulty walking.  Pt reports that he is having weakness in bilat hands and feet.  PERTINENT HISTORY:  Angina, CAD, Hx of MI in 2016, Pacemaker placement in 2007, s/p cervical laminectomy 04/16/22, polio as a child  PAIN:  Are you having pain? Yes: NPRS scale:  discomfort 5/10 Pain location: bilateral hands Pain description: discomfort numbness Aggravating factors: unknown Relieving factors: unknown  PRECAUTIONS: Cervical, Fall, and ICD/Pacemaker recent cervical laminectomy  WEIGHT BEARING RESTRICTIONS No  FALLS:  Has patient fallen in last 6 months? No  LIVING ENVIRONMENT: Lives with: lives with their spouse Lives in: House/apartment Stairs: Yes: External: 3 steps; can reach both Has following equipment at home: Gilford Rile - 4 wheeled, shower chair, and Grab bars  OCCUPATION: Retired Theatre manager  PLOF: Independent and Leisure: Museum/gallery curator, model train  PATIENT GOALS:  To walk normally again.  OBJECTIVE:   DIAGNOSTIC FINDINGS:  04/16/22 Cervical Radiographs IMPRESSION: Localization film for C3-C4 posterior laminectomy. Surgical instruments overlying the soft tissues posterior to the C2 through C4 spinous processes.  PATIENT SURVEYS:  05/11/2022:  FOTO 51% (projected 60% by visit 12)   COGNITION: Overall cognitive status: Within functional limits for tasks assessed   SENSATION: Reports numbness and tingling in hands and feet  POSTURE:  Right shoulder higher than left with side lean noted  PALPATION: Some muscle spasms noted in cervical region   CERVICAL ROM:   Active ROM A/ROM (deg) eval  Flexion 40  Extension 10  Right lateral flexion 15  Left lateral flexion 10  Right rotation 30  Left rotation 19   (Blank rows = not  tested)  UPPER EXTREMITY ROM:  Active ROM Right eval Left eval  Shoulder flexion 126 105  Shoulder extension    Shoulder abduction 115 85  Shoulder adduction    Shoulder internal rotation    Shoulder external rotation    Elbow flexion    Elbow extension    Wrist flexion    Wrist extension    Wrist ulnar deviation    Wrist radial deviation    Wrist pronation    Wrist supination     (Blank rows = not tested)  UPPER EXTREMITY AND LOWER EXTREMITY MMT: 05/11/2022:   Right UE strength of 4-/5, Left UE strength of 3/5 Bilateral LE strength of 4/5 grossly throughout  FUNCTIONAL TESTS:  05/11/2022: 5 times sit to stand: 15.3 sec Timed up and go (TUG): 13.4 sec   TODAY'S TREATMENT:  05/11/2022:  Issued HEP and reviewed with patient   PATIENT EDUCATION:  Education details: Issued HEP Person educated: Patient Education method: Consulting civil engineer, Verbal cues, and Handouts Education comprehension: verbalized understanding and returned demonstration   HOME EXERCISE PROGRAM: Access Code: [email or web address] URL: [email or web address] Date: 05/11/2022 Prepared by: Shelby Dubin Magdalyn Arenivas  Exercises - Seated Cervical Rotation AROM  - 1 x daily - 7 x weekly - 2 sets - 10 reps - Seated Cervical Extension AROM  - 1 x daily - 7 x weekly - 2 sets - 10 reps - Seated Scapular Retraction  - 1 x daily - 7 x weekly - 2 sets - 10 reps - Seated March  - 1 x daily - 7 x weekly - 2 sets - 10 reps - Seated Long Arc Quad  - 1 x daily - 7 x weekly - 2 sets - 10 reps  ASSESSMENT:  CLINICAL IMPRESSION: Patient is an 81 y.o. male who was seen today for physical therapy evaluation and treatment for cervical stenosis  s/p laminectomy. Pts PLOF is independent and ambulatory without a device.  Pt has a history of Polio as a child with some associated postural deficits.  Pt presents to skilled PT with decreased cervical A/ROM, muscle weakness, decreased shoulder A/ROM, difficulty walking with imbalance noted  during TUG.  Patient has a pending order for OT Evaluation secondary to hand weakness.  Pt would benefit from skilled PT to progress towards goal related activities.   OBJECTIVE IMPAIRMENTS decreased activity tolerance, decreased balance, difficulty walking, decreased ROM, decreased strength, increased muscle spasms, impaired flexibility, impaired UE functional use, postural dysfunction, and pain.   ACTIVITY LIMITATIONS carrying, stairs, transfers, and reach over head  PARTICIPATION LIMITATIONS: cleaning, driving, and community activity  PERSONAL FACTORS Age and 3+ comorbidities: s/p cervical laminectomy on 04/16/22, polio in childhood, s/p Pacemaker placement, DM, Angina  are also affecting patient's functional outcome.   REHAB POTENTIAL: Good  CLINICAL DECISION MAKING: Evolving/moderate complexity  EVALUATION COMPLEXITY: Moderate   GOALS: Goals reviewed with patient? Yes  SHORT TERM GOALS: Target date: 06/01/2022   Pt will be independent with initial HEP. Baseline:  Goal status: INITIAL  2.  Pt will report at least a 30% improvement in symptoms since starting PT. Baseline:  Goal status: INITIAL   LONG TERM GOALS: Target date: 07/02/2022  Pt will be independent with advanced HEP. Baseline:  Goal status: INITIAL  2.  Pt will increase FOTO to at least 60% to demonstrate improvements in functional mobility. Baseline: 51% Goal status: INITIAL  3.  Pt will increase UE and LE bilateral strength to at least 4+/5 to allow him to more easily perform household tasks. Baseline: see above Goal status: INITIAL  4.  Pt will report ability to ambulate for at least 20 min without any increased pain or loss of balance to allow for community reintegration. Baseline:  Goal status: INITIAL  5.  Pt will increase cervical A/ROM to [facility] to allow him to drive without difficulty or increased pain. Baseline: see above Goal status: INITIAL    PLAN: PT FREQUENCY: 2x/week  PT DURATION: 8  weeks  PLANNED INTERVENTIONS: Therapeutic exercises, Therapeutic activity, Neuromuscular re-education, Balance training, Gait training, Patient/Family education, Self Care, Joint mobilization, Joint manipulation, Stair training, Aquatic Therapy, Dry Needling, Spinal manipulation, Spinal mobilization, Cryotherapy, Moist heat, scar mobilization, Taping, Ultrasound, Ionotophoresis '4mg'$ /ml Dexamethasone, Manual therapy, and Re-evaluation  PLAN FOR NEXT SESSION: progress and assess HEP, strengthening, A/ROM, balance   Camyra Vaeth, PT 05/11/2022, 1:36 PM

## 2022-05-13 ENCOUNTER — Telehealth: Payer: Self-pay | Admitting: Cardiology

## 2022-05-13 NOTE — Telephone Encounter (Signed)
Oceanic-AF trial medication added to medication list.  Last dose of Xarelto taken on 12Jun2023.

## 2022-05-15 ENCOUNTER — Other Ambulatory Visit: Payer: Self-pay | Admitting: Cardiology

## 2022-05-15 DIAGNOSIS — E782 Mixed hyperlipidemia: Secondary | ICD-10-CM

## 2022-05-17 DIAGNOSIS — Z45018 Encounter for adjustment and management of other part of cardiac pacemaker: Secondary | ICD-10-CM | POA: Diagnosis not present

## 2022-05-17 DIAGNOSIS — I495 Sick sinus syndrome: Secondary | ICD-10-CM | POA: Diagnosis not present

## 2022-05-18 IMAGING — CR DG KNEE COMPLETE 4+V*L*
4 series · 4 of 4 positions shown · non-contrast
Comparison: None.

CLINICAL DATA: Left knee pain after injury that occurred 1 week
ago.

EXAM:
LEFT KNEE - COMPLETE 4+ VIEW

[knee ap]
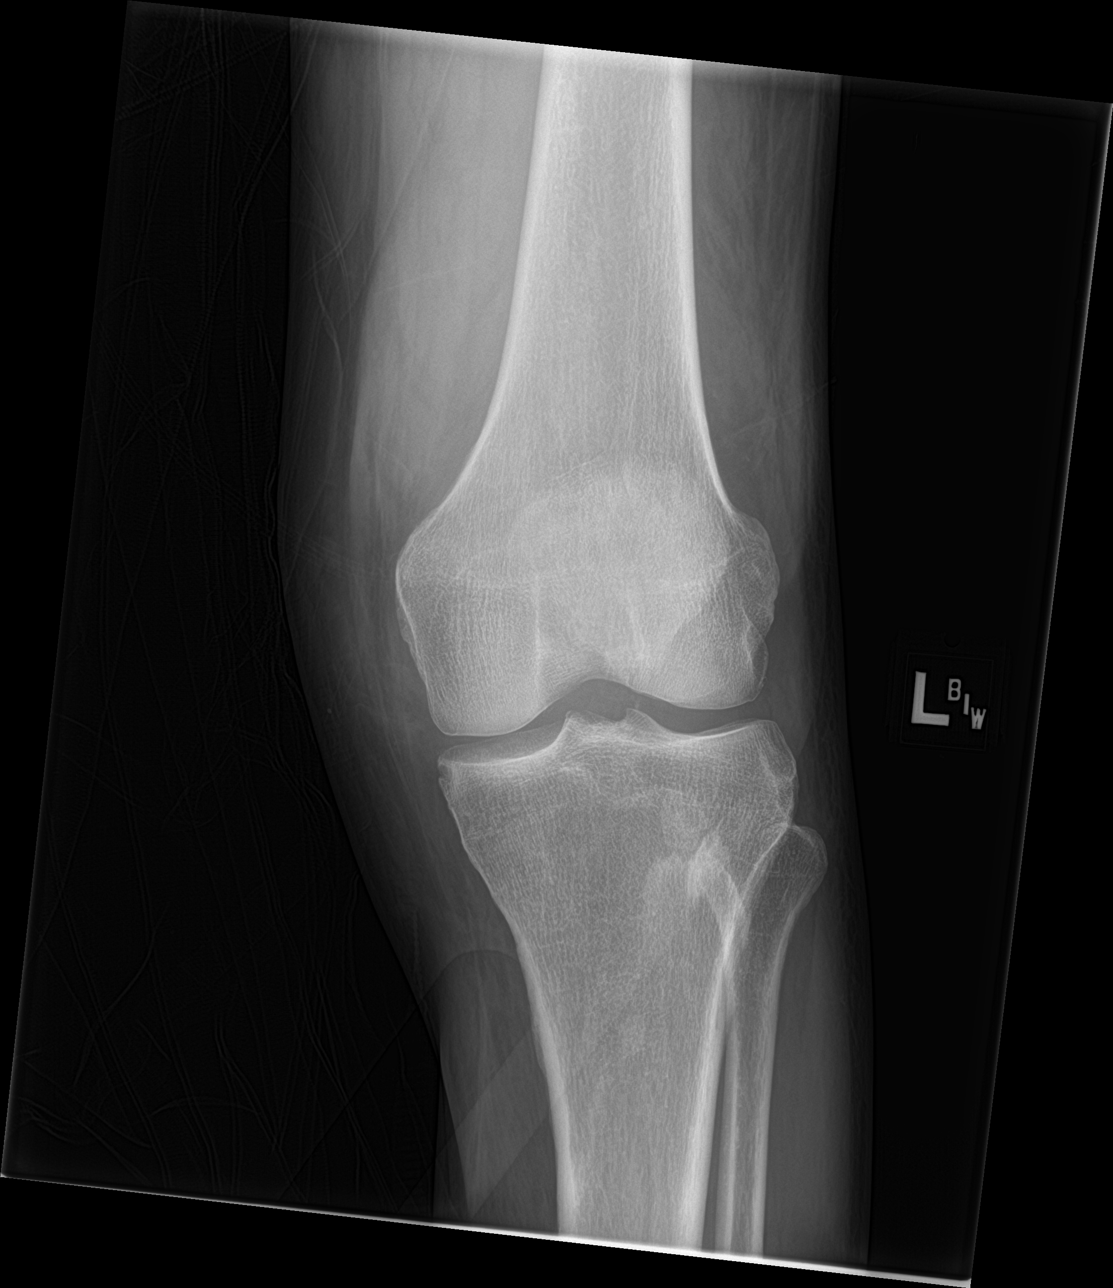

[knee lat]
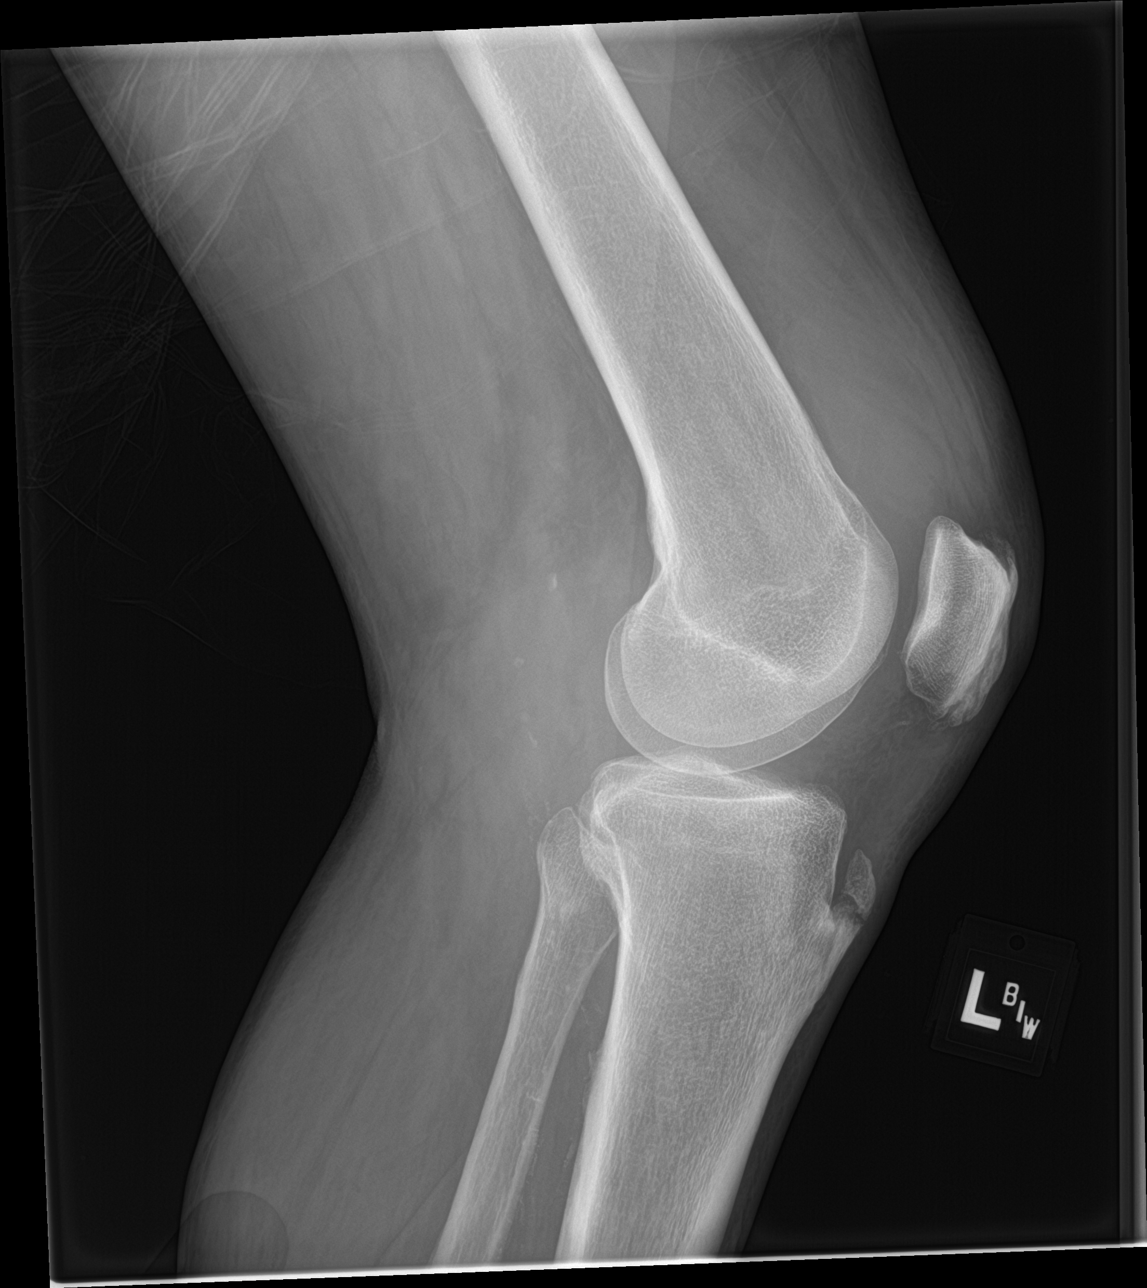

[knee obl (1 of 2)]
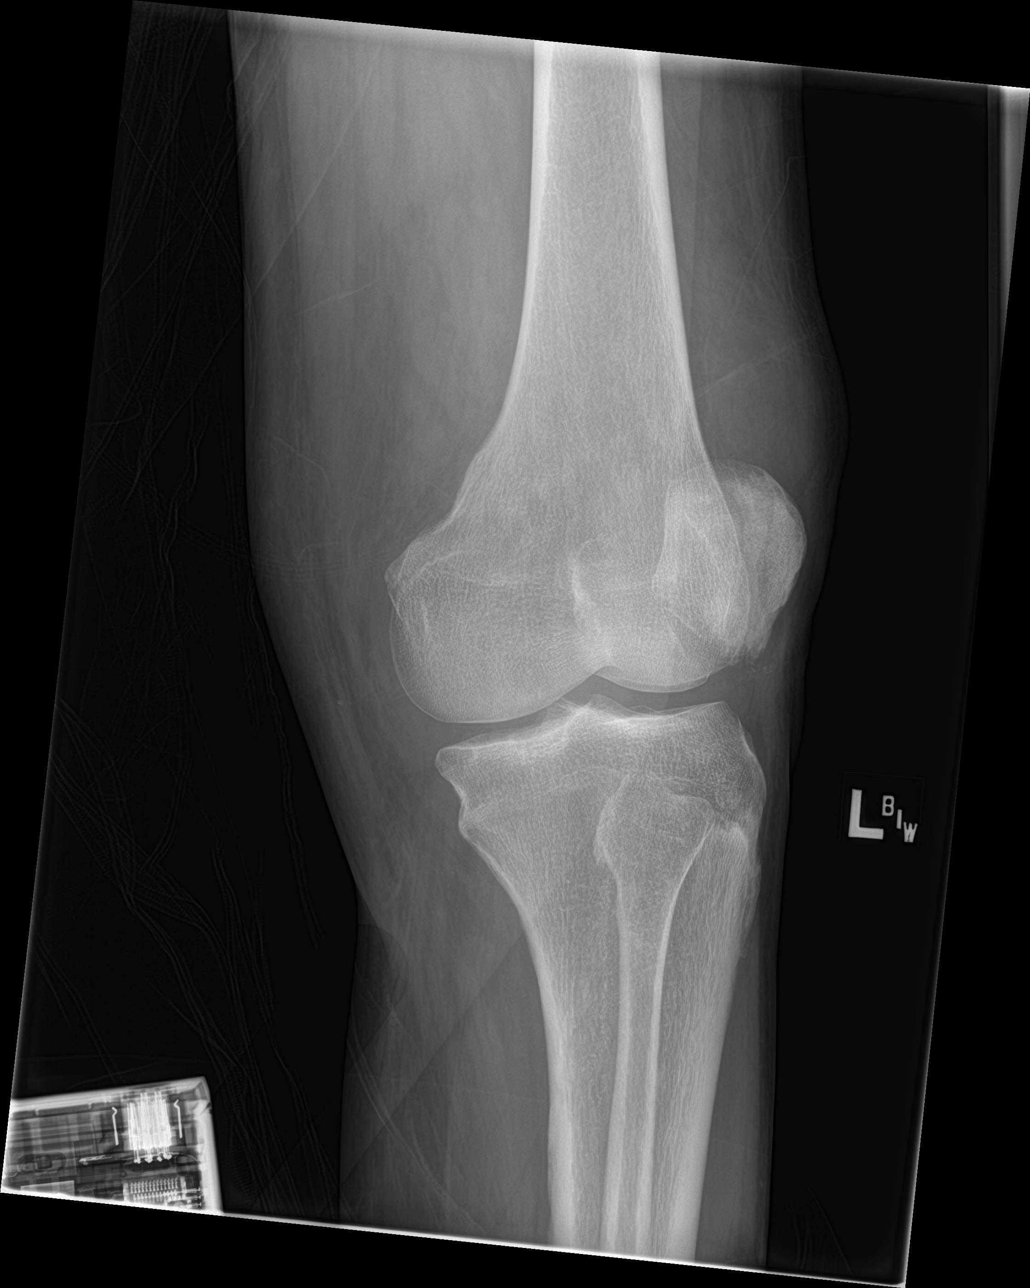

[knee obl (2 of 2)]
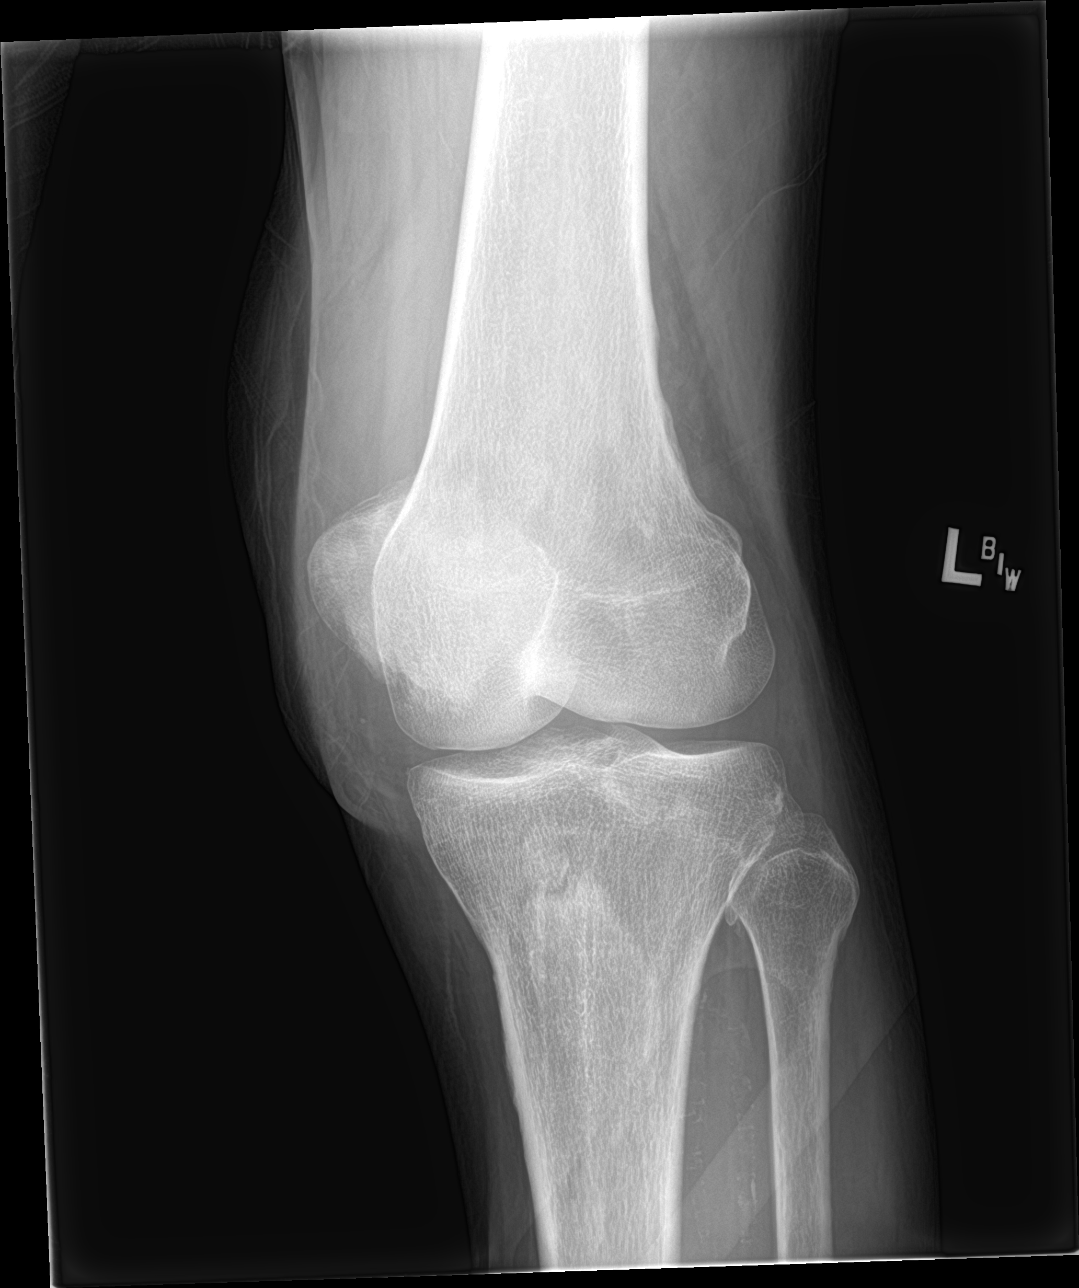

[4 of 4 positions shown; findings below may reference images not displayed]

FINDINGS: Corticated lucency through the base of a large osteophyte arising
form the tibial tubercle, which may represent an avulsion fracture.
No malalignment. Moderate to large joint effusion. Mild multi
compartment degenerative changes, greatest in the medial and
patellofemoral compartments. Patellar enthesophytes. Soft tissue
swelling about the medial knee. Vascular calcifications.
IMPRESSION: 1. Lucency through base of the tibial tubercle, which may represent
an avulsion fracture. The margins of the lucency are corticated,
suggesting it may be remote. Recommend correlation with the presence
or absence of point tenderness.
2. Moderate to large joint effusion.

## 2022-05-18 IMAGING — CT CT KNEE*L* W/O CM
3 series · 14 of 33 positions shown, 17 images · non-contrast
Comparison: X-ray 03/25/2020

CLINICAL DATA: Left knee pain after injury 1 week ago

EXAM:
CT OF THE LEFT KNEE WITHOUT CONTRAST
TECHNIQUE: Multidetector CT imaging of the left knee was performed according to
the standard protocol. Multiplanar CT image reconstructions were
also generated.

[Series 4: lfov ext 3.0 b40s · axial · 0.44mm/px · z∈[+394,+562]mm · 6 of 74 slices shown, 8 images]
[im 12/74  soft-tissue]
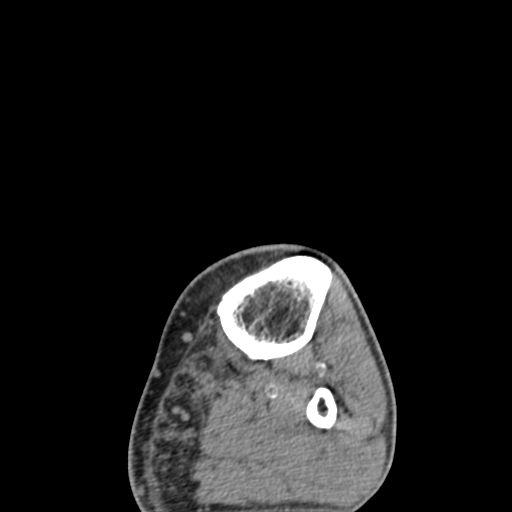
[im 12/74  bone]
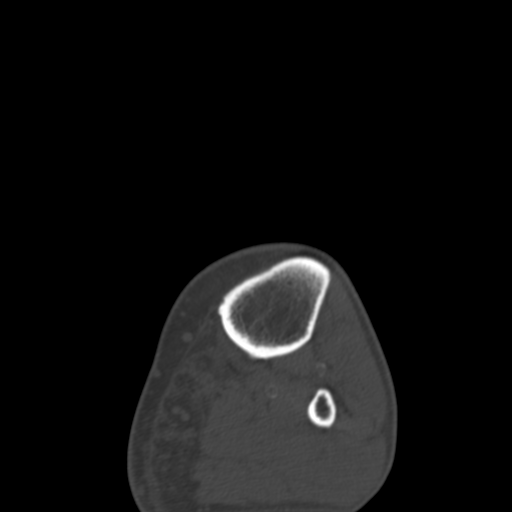
[im 23/74  bone]
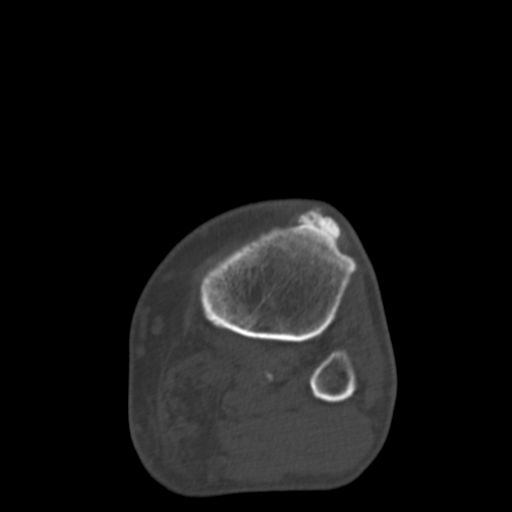
[im 34/74  bone]
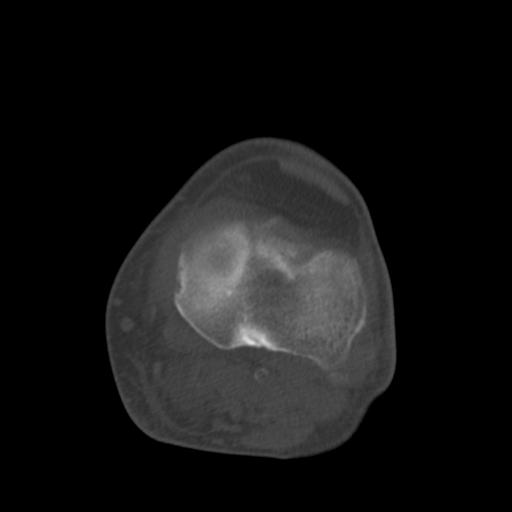
[im 45/74  bone]
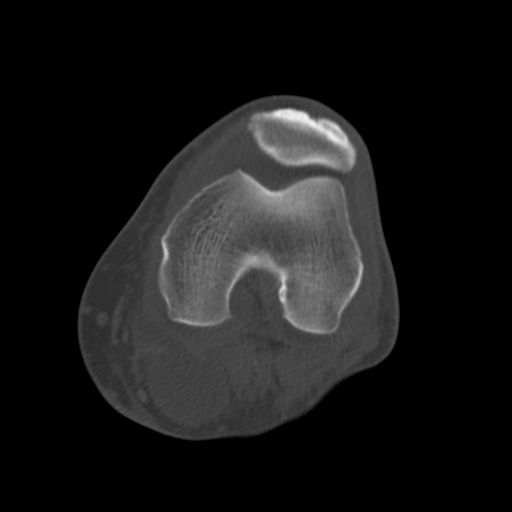
[im 57/74  soft-tissue]
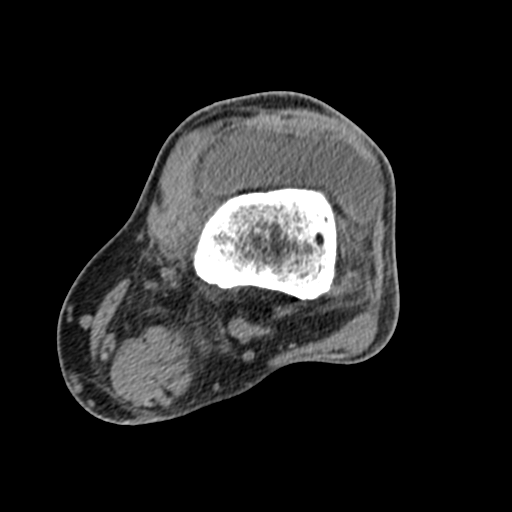
[im 57/74  bone]
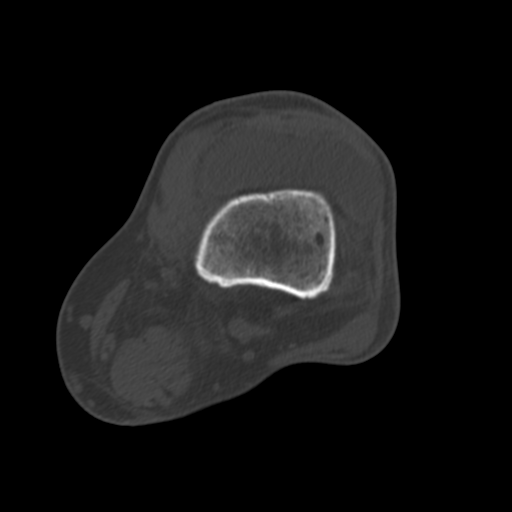
[im 68/74  bone]
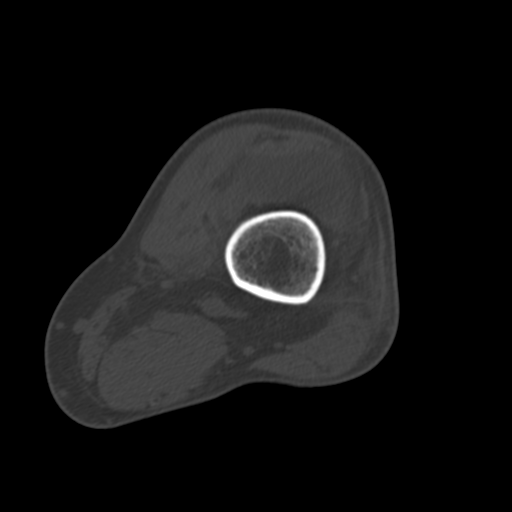

[Series 7: coronalsoft tissue · coronal · 0.37mm/px · 3 of 90 slices shown]
[im 18/90  bone]
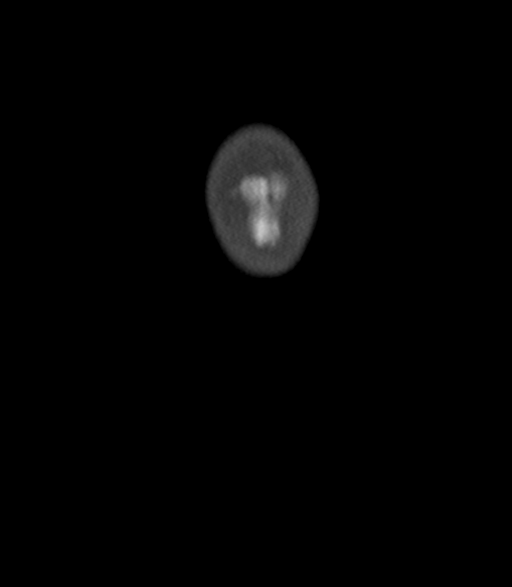
[im 36/90  bone]
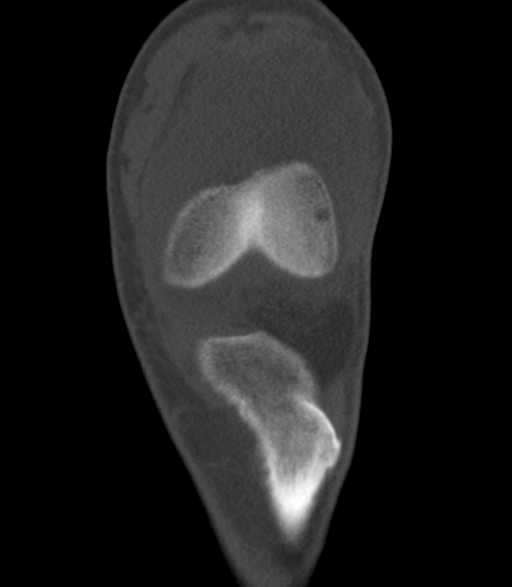
[im 54/90  bone]
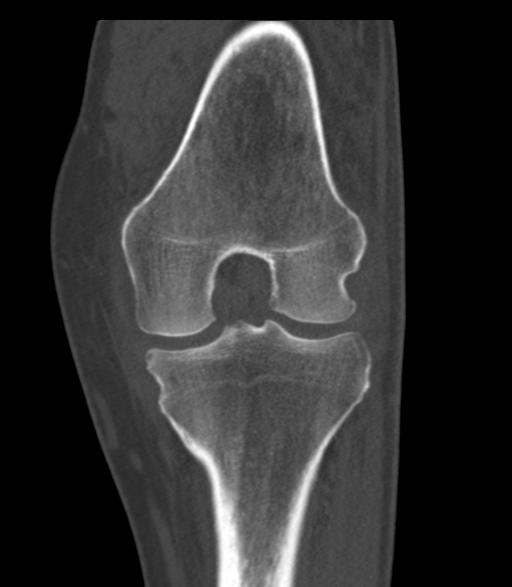

[Series 8: sagittalsoft tissue · sagittal · 0.33mm/px · 5 of 77 slices shown, 6 images]
[im 26/77  bone]
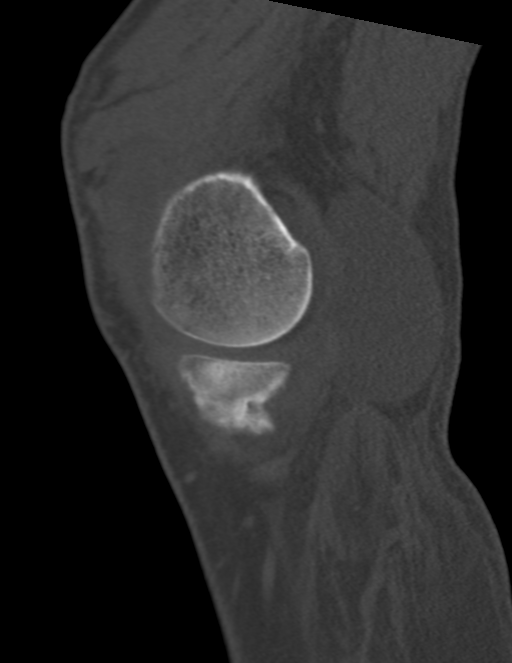
[im 32/77  bone]
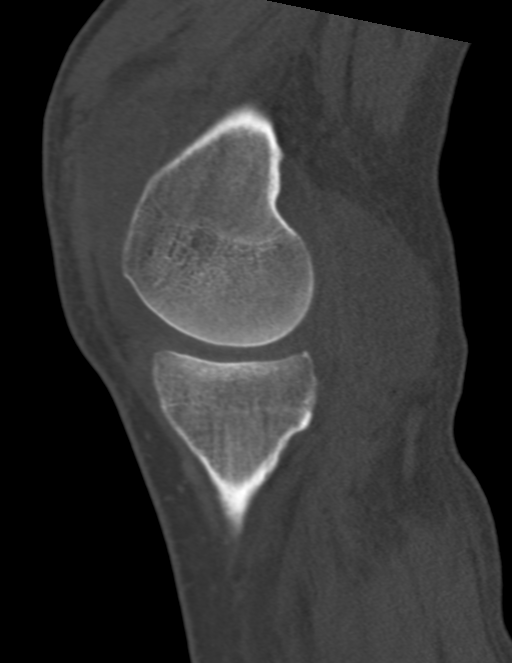
[im 39/77  soft-tissue]
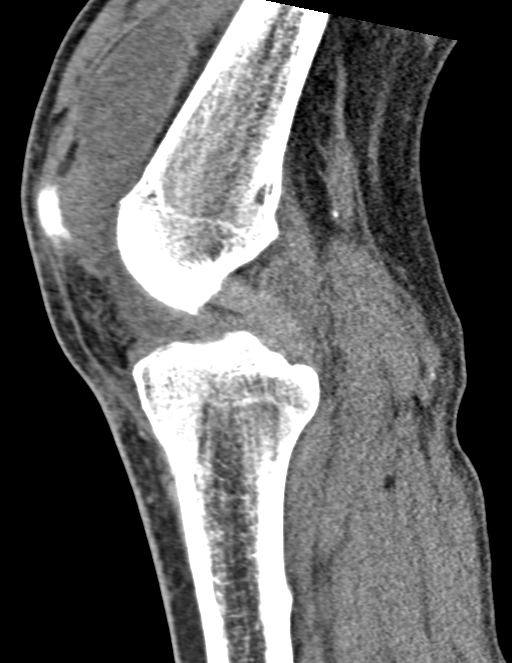
[im 39/77  bone]
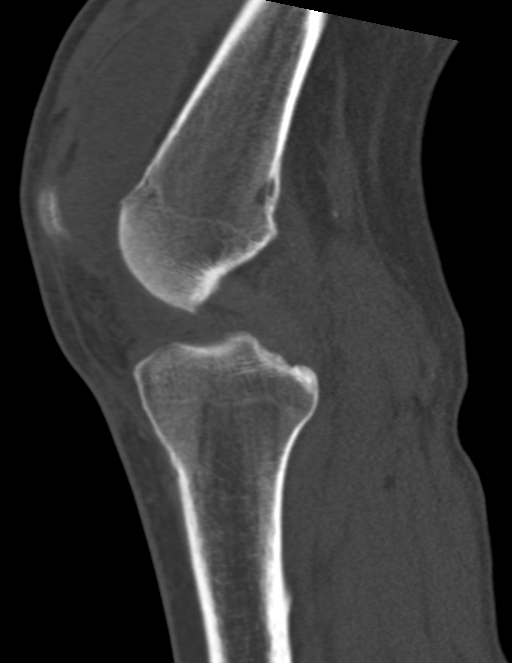
[im 45/77  bone]
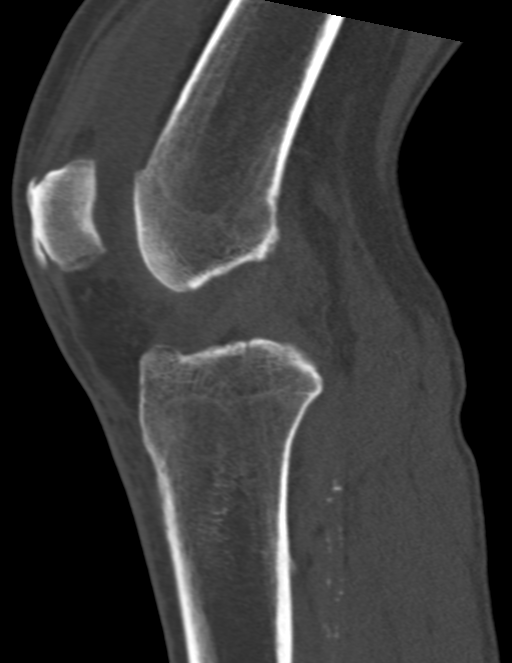
[im 51/77  bone]
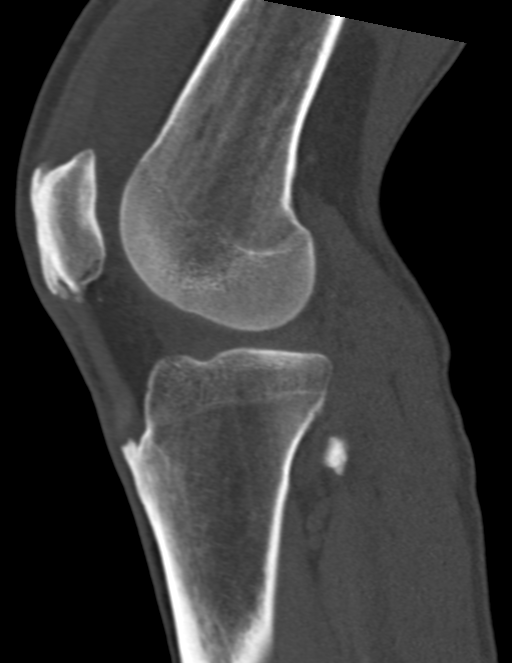

[14 of 33 positions shown; findings below may reference images not displayed]

FINDINGS: Bones/Joint/Cartilage

No acute fracture. No dislocation. Osseous fragmentation at the
tibial tubercle with well corticated ossification within the distal
patellar tendon. Joint spaces are relatively well preserved. Mild
marginal osteophytosis within the patellofemoral compartment.
Subcortical cystic changes within the periphery of the medial tibial
plateau. There is a moderate to large sized knee joint effusion
without fat-fluid level. Moderate-sized Baker's cyst measuring up to
7.1 cm in length.

Ligaments

Suboptimally assessed by CT. Soft tissue edema adjacent to the
medial collateral ligament (series 7, image 50).

Muscles and Tendons

Fatty atrophy of the medial head of the gastrocnemius muscle.
Tendinous structures appear intact.

Soft tissues

Mild subcutaneous edema the posteromedial aspect of the knee.
IMPRESSION: 1. No acute fracture or dislocation of the left knee.
Well-corticated ossification within the distal patellar tendon
compatible with either remote trauma or chronic tendinopathy.
2. Moderate to large sized knee joint effusion, nonspecific.
3. Moderate-sized Baker's cyst.
4. Soft tissue edema adjacent to the medial collateral ligament,
which may reflect an MCL sprain.
5. Fatty atrophy of the medial head of the gastrocnemius muscle.

## 2022-05-19 ENCOUNTER — Encounter: Payer: Self-pay | Admitting: Rehabilitative and Restorative Service Providers"

## 2022-05-19 ENCOUNTER — Ambulatory Visit: Payer: Medicare HMO | Admitting: Rehabilitative and Restorative Service Providers"

## 2022-05-19 ENCOUNTER — Ambulatory Visit: Payer: Medicare HMO | Admitting: Occupational Therapy

## 2022-05-19 ENCOUNTER — Other Ambulatory Visit: Payer: Self-pay

## 2022-05-19 DIAGNOSIS — R252 Cramp and spasm: Secondary | ICD-10-CM

## 2022-05-19 DIAGNOSIS — R2689 Other abnormalities of gait and mobility: Secondary | ICD-10-CM | POA: Diagnosis not present

## 2022-05-19 DIAGNOSIS — R278 Other lack of coordination: Secondary | ICD-10-CM | POA: Diagnosis not present

## 2022-05-19 DIAGNOSIS — M6281 Muscle weakness (generalized): Secondary | ICD-10-CM

## 2022-05-19 DIAGNOSIS — R208 Other disturbances of skin sensation: Secondary | ICD-10-CM | POA: Diagnosis not present

## 2022-05-19 DIAGNOSIS — M4802 Spinal stenosis, cervical region: Secondary | ICD-10-CM | POA: Diagnosis not present

## 2022-05-19 DIAGNOSIS — M542 Cervicalgia: Secondary | ICD-10-CM

## 2022-05-19 NOTE — Therapy (Signed)
OUTPATIENT PHYSICAL THERAPY TREATMENT NOTE   Patient Name: Jesse [name] MRN: [id number] DOB:July 31, 1940, 81 y.o., male Today's Date: 05/19/2022   PT End of Session - 05/19/22 1202     Visit Number 2    Date for PT Re-Evaluation 07/02/22    Authorization Type [employer]    Progress Note Due on Visit 10    PT Start Time 1158    PT Stop Time 1228    PT Time Calculation (min) 30 min    Activity Tolerance Patient tolerated treatment well    Behavior During Therapy [facility] for tasks assessed/performed             Past Medical History:  Diagnosis Date   Angina pectoris ([place]) 02/03/2015   CAD (coronary artery disease), native coronary artery 08/30/2018   2007 [place], [place]: LIMA to LAD, Occluded SVG to RCA and OM by angiogram S/P  PCI/orbital atherectomy S/P 2.5x15 Resoluute to prox Cx on 11/29/2017.   Coronary artery disease    High cholesterol    Hypertension    Myocardial infarction [facility]) 2017/2018?   NSTEMI (non-ST elevated myocardial infarction) ([place]) 02/03/2015   Presence of permanent cardiac pacemaker    Medtronic   Type II diabetes mellitus ([place])    Past Surgical History:  Procedure Laterality Date   CARDIAC CATHETERIZATION N/A 02/04/2015   Procedure: Left Heart Cath and Cors/Grafts Angiography;  Surgeon: Adrian Prows, MD;  Location: [place] [facility];  Service: Cardiovascular;  Laterality: N/A;   CARDIAC CATHETERIZATION N/A 02/04/2015   Procedure: Coronary Stent Intervention;  Surgeon: Adrian Prows, MD;  Location: [place] [facility];  Service: Cardiovascular;  Laterality: N/A;   COLONOSCOPY     several   CORONARY ANGIOGRAPHY N/A 10/18/2017   Procedure: CORONARY ANGIOGRAPHY;  Surgeon: Adrian Prows, MD;  Location: [place] [facility];  Service: Cardiovascular;  Laterality: N/A;   CORONARY ANGIOPLASTY     CORONARY ANGIOPLASTY WITH STENT PLACEMENT     "i've got 5-6 stents total" (08/30/2017)   CORONARY ARTERY BYPASS GRAFT  2007   CABG X3   CORONARY ATHERECTOMY N/A 10/18/2017    Procedure: CORONARY ATHERECTOMY;  Surgeon: Adrian Prows, MD;  Location: [place] [facility];  Service: Cardiovascular;  Laterality: N/A;   CORONARY ATHERECTOMY N/A 11/29/2017   Procedure: CORONARY ATHERECTOMY;  Surgeon: Adrian Prows, MD;  Location: [place] [facility];  Service: Cardiovascular;  Laterality: N/A;   CORONARY CTO INTERVENTION N/A 08/30/2017   Procedure: CORONARY CTO INTERVENTION;  Surgeon: Adrian Prows, MD;  Location: [place] [facility];  Service: Cardiovascular;  Laterality: N/A;   CORONARY STENT INTERVENTION N/A 11/29/2017   Procedure: CORONARY STENT INTERVENTION;  Surgeon: Adrian Prows, MD;  Location: [place] [facility];  Service: Cardiovascular;  Laterality: N/A;   INSERT / REPLACE / REMOVE PACEMAKER  ?2012   Now currently has Medtronic Pacemaker.  "I think it was placed in 2012"   KNEE ARTHROSCOPY Left 04/03/2020   Procedure: LEFT KNEE ARTHROSCOPY  WASHOUT;  Surgeon: Tania Ade, MD;  Location: [facility];  Service: Orthopedics;  Laterality: Left;   LEFT HEART CATH AND CORONARY ANGIOGRAPHY N/A 08/30/2017   Procedure: LEFT HEART CATH AND CORONARY ANGIOGRAPHY;  Surgeon: Adrian Prows, MD;  Location: [place] [facility];  Service: Cardiovascular;  Laterality: N/A;   LEFT HEART CATH AND CORS/GRAFTS ANGIOGRAPHY N/A 08/05/2017   Procedure: LEFT HEART CATH AND CORS/GRAFTS ANGIOGRAPHY;  Surgeon: Adrian Prows, MD;  Location: [place] [facility];  Service: Cardiovascular;  Laterality: N/A;   LEFT  HEART CATH AND CORS/GRAFTS ANGIOGRAPHY N/A 06/30/2021   Procedure: LEFT HEART CATH AND CORS/GRAFTS ANGIOGRAPHY;  Surgeon: Adrian Prows, MD;  Location: [place] [facility];  Service: Cardiovascular;  Laterality: N/A;   POSTERIOR CERVICAL LAMINECTOMY N/A 04/16/2022   Procedure: POSTERIOR CERVICAL LAMINECTOMY, CERVICAL THREE-CERVICAL FOUR;  Surgeon: Consuella Lose, MD;  Location: [place];  Service: Neurosurgery;  Laterality: N/A;  3C   PPM GENERATOR CHANGEOUT N/A 06/17/2021   Procedure: PPM GENERATOR CHANGEOUT;   Surgeon: Evans Lance, MD;  Location: [place] [facility];  Service: Cardiovascular;  Laterality: N/A;   Patient Active Problem List   Diagnosis Date Noted   Stenosis of cervical spine with myelopathy ([place]) 04/16/2022   Enrolled in clinical trial: OCEANIC-AF (Asundexian - factor XIa inhibitor PO BID vs Apixaban PO BID in patients with A. Fib for stroke prevention. 03/26/2022   PAF (paroxysmal atrial fibrillation) ([place]). CHA2DS2-VASc Score is 5 (A, HTN, DM Vasc Dz). 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 12/15/2018   Flu-like symptoms 09/04/2018   Troponin level elevated 09/02/2018   CAD (coronary artery disease), native coronary artery 08/30/2018   Essential hypertension    Mixed hyperlipidemia    Angina pectoris ([place]) 02/03/2015   Post PTCA 02/03/2015   Diabetes mellitus ([place]) 02/03/2015   Sick sinus syndrome [facility])    Pacemaker Medtronic dual chamber AzureT XT DR MRI SureScan Gen change 06/17/21 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    PCP: Deland Pretty, MD  REFERRING PROVIDER: Consuella Lose, MD   REFERRING DIAG: [id number] (ICD-10-CM) - Spinal stenosis, cervical region   THERAPY DIAG:  Cervicalgia  Muscle weakness (generalized)  Other abnormalities of gait and mobility  Cramp and spasm  Rationale for Evaluation and Treatment Rehabilitation  ONSET DATE: s/p cervical laminectomy on 04/16/2022  SUBJECTIVE:                                                                                                                                                                                                         SUBJECTIVE STATEMENT: Pt arrives late, as he was held up at his OT Evaluation.  Pt states denies pain, just reports discomfort.  PERTINENT HISTORY:  Angina, CAD,  Hx of MI in 2016, Pacemaker placement in 2007, s/p cervical laminectomy 04/16/22, polio as a child  PAIN:  Are you having pain? Yes: NPRS scale:  discomfort 5/10 Pain location: bilateral hands Pain description: discomfort numbness Aggravating factors: unknown Relieving factors: unknown  PRECAUTIONS: Cervical, Fall, and ICD/Pacemaker recent cervical laminectomy  WEIGHT BEARING RESTRICTIONS No  FALLS:  Has patient fallen in last 6 months? No  LIVING ENVIRONMENT: Lives with: lives with their spouse Lives in: House/apartment Stairs: Yes: External: 3 steps; can reach both Has following equipment at home: [place] Rile - 4 wheeled, shower chair, and Grab bars  OCCUPATION: Retired Theatre manager  PLOF: Independent and Leisure: Museum/gallery curator, model train  PATIENT GOALS:  To walk normally again.  OBJECTIVE:   DIAGNOSTIC FINDINGS:  04/16/22 Cervical Radiographs IMPRESSION: Localization film for C3-C4 posterior laminectomy. Surgical instruments overlying the soft tissues posterior to the C2 through C4 spinous processes.  PATIENT SURVEYS:  05/11/2022:  FOTO 51% (projected 60% by visit 12)   COGNITION: Overall cognitive status: Within functional limits for tasks assessed   SENSATION: Reports numbness and tingling in hands and feet  POSTURE:  Right shoulder higher than left with side lean noted  PALPATION: Some muscle spasms noted in cervical region   CERVICAL ROM:   Active ROM A/ROM (deg) eval  Flexion 40  Extension 10  Right lateral flexion 15  Left lateral flexion 10  Right rotation 30  Left rotation 19   (Blank rows = not tested)  UPPER EXTREMITY ROM:  Active ROM Right eval Left eval  Shoulder flexion 126 105  Shoulder extension    Shoulder abduction 115 85  Shoulder adduction    Shoulder internal rotation    Shoulder external rotation    Elbow flexion    Elbow extension    Wrist flexion    Wrist extension    Wrist ulnar deviation    Wrist radial  deviation    Wrist pronation    Wrist supination     (Blank rows = not tested)  UPPER EXTREMITY AND LOWER EXTREMITY MMT: 05/11/2022:   Right UE strength of 4-/5, Left UE strength of 3/5 Bilateral LE strength of 4/5 grossly throughout  FUNCTIONAL TESTS:  05/11/2022: 5 times sit to stand: 15.3 sec Timed up and go (TUG): 13.4 sec  05/19/2022: 3 minute walk test:  451 ft without assistive device   TODAY'S TREATMENT:  Date:  05/19/2022 Nustep level 3 x5 min with PT present to discuss status Cervical rotation and cervical extension 2x10 each Seated scapular retraction 2x10 Seated with 2#:  marching, LAQ, hip abduction scissors.  2x10 bilat each Seated hip adduction ball squeeze 2x10 3 minute ambulation without assistive device Seated green pball rollout 2x10 to encourage shoulder flexion Sit to/from stand x10   05/11/2022:  Issued HEP and reviewed with patient   PATIENT EDUCATION:  Education details: Issued HEP Person educated: Patient Education method: Consulting civil engineer, Verbal cues, and Handouts Education comprehension: verbalized understanding and returned demonstration   HOME EXERCISE PROGRAM: Access Code: [email or web address] URL: [email or web address] Date: 05/11/2022 Prepared by: Shelby Dubin Gennie Dib  Exercises - Seated Cervical Rotation AROM  - 1 x daily - 7 x weekly - 2 sets - 10 reps - Seated Cervical Extension AROM  - 1 x daily - 7 x weekly - 2 sets - 10 reps - Seated Scapular Retraction  - 1 x daily - 7 x weekly - 2 sets - 10 reps - Seated March  - 1 x daily - 7 x weekly - 2 sets - 10 reps - Seated Long Arc Quad  - 1 x daily - 7 x weekly - 2 sets - 10 reps  ASSESSMENT:  CLINICAL IMPRESSION: Mr Musgrave presents to skilled PT immediately after leaving his OT appointment.  Pt reports that he has been doing his HEP.  Pt able to perform exercises with minimal cuing, primarily to slow down.  Pt with some reports of fatigue during PT session, but able to progress with  limited recovery periods required.  Pt continues to require skilled PT to progress towards goal related activities.   OBJECTIVE IMPAIRMENTS decreased activity tolerance, decreased balance, difficulty walking, decreased ROM, decreased strength, increased muscle spasms, impaired flexibility, impaired UE functional use, postural dysfunction, and pain.   ACTIVITY LIMITATIONS carrying, stairs, transfers, and reach over head  PARTICIPATION LIMITATIONS: cleaning, driving, and community activity  PERSONAL FACTORS Age and 3+ comorbidities: s/p cervical laminectomy on 04/16/22, polio in childhood, s/p Pacemaker placement, DM, Angina  are also affecting patient's functional outcome.   REHAB POTENTIAL: Good  CLINICAL DECISION MAKING: Evolving/moderate complexity  EVALUATION COMPLEXITY: Moderate   GOALS: Goals reviewed with patient? Yes  SHORT TERM GOALS: Target date: 06/01/2022   Pt will be independent with initial HEP. Baseline:  Goal status: IN PROGRESS  2.  Pt will report at least a 30% improvement in symptoms since starting PT. Baseline:  Goal status: INITIAL   LONG TERM GOALS: Target date: 07/02/2022  Pt will be independent with advanced HEP. Baseline:  Goal status: INITIAL  2.  Pt will increase FOTO to at least 60% to demonstrate improvements in functional mobility. Baseline: 51% Goal status: INITIAL  3.  Pt will increase UE and LE bilateral strength to at least 4+/5 to allow him to more easily perform household tasks. Baseline: see above Goal status: INITIAL  4.  Pt will report ability to ambulate for at least 20 min without any increased pain or loss of balance to allow for community reintegration. Baseline:  Goal status: INITIAL  5.  Pt will increase cervical A/ROM to [facility] to allow him to drive without difficulty or increased pain. Baseline: see above Goal status: INITIAL    PLAN: PT FREQUENCY: 2x/week  PT DURATION: 8 weeks  PLANNED INTERVENTIONS: Therapeutic  exercises, Therapeutic activity, Neuromuscular re-education, Balance training, Gait training, Patient/Family education, Self Care, Joint mobilization, Joint manipulation, Stair training, Aquatic Therapy, Dry Needling, Spinal manipulation, Spinal mobilization, Cryotherapy, Moist heat, scar mobilization, Taping, Ultrasound, Ionotophoresis '4mg'$ /ml Dexamethasone, Manual therapy, and Re-evaluation  PLAN FOR NEXT SESSION: progress and assess HEP, strengthening, A/ROM, balance   Juel Burrow, PT 05/19/2022, 12:39 PM   [facility] [address] [place], [place] [address] Phone # [phone] Fax [phone]

## 2022-05-19 NOTE — Therapy (Signed)
OUTPATIENT OCCUPATIONAL THERAPY NEURO EVALUATION  Patient Name: Jesse [name] MRN: [id number] DOB:Feb 29, 1941, 81 y.o., male Today's Date: 05/19/2022  PCP: Deland Pretty, MD REFERRING PROVIDER: Consuella Lose, MD    OT End of Session - 05/19/22 1300     Visit Number 1    Number of Visits 15    Date for OT Re-Evaluation 07/16/22    Authorization Type [employer]    OT Start Time 1105    OT Stop Time 1761    OT Time Calculation (min) 41 min             Past Medical History:  Diagnosis Date   Angina pectoris ([place]) 02/03/2015   CAD (coronary artery disease), native coronary artery 08/30/2018   2007 [place], [place]: LIMA to LAD, Occluded SVG to RCA and OM by angiogram S/P  PCI/orbital atherectomy S/P 2.5x15 Resoluute to prox Cx on 11/29/2017.   Coronary artery disease    High cholesterol    Hypertension    Myocardial infarction [facility]) 2017/2018?   NSTEMI (non-ST elevated myocardial infarction) ([place]) 02/03/2015   Presence of permanent cardiac pacemaker    Medtronic   Type II diabetes mellitus ([place])    Past Surgical History:  Procedure Laterality Date   CARDIAC CATHETERIZATION N/A 02/04/2015   Procedure: Left Heart Cath and Cors/Grafts Angiography;  Surgeon: Adrian Prows, MD;  Location: [place] [facility];  Service: Cardiovascular;  Laterality: N/A;   CARDIAC CATHETERIZATION N/A 02/04/2015   Procedure: Coronary Stent Intervention;  Surgeon: Adrian Prows, MD;  Location: [place] [facility];  Service: Cardiovascular;  Laterality: N/A;   COLONOSCOPY     several   CORONARY ANGIOGRAPHY N/A 10/18/2017   Procedure: CORONARY ANGIOGRAPHY;  Surgeon: Adrian Prows, MD;  Location: [place] [facility];  Service: Cardiovascular;  Laterality: N/A;   CORONARY ANGIOPLASTY     CORONARY ANGIOPLASTY WITH STENT PLACEMENT     "i've got 5-6 stents total" (08/30/2017)   CORONARY ARTERY BYPASS GRAFT  2007   CABG X3   CORONARY ATHERECTOMY N/A 10/18/2017   Procedure: CORONARY ATHERECTOMY;  Surgeon:  Adrian Prows, MD;  Location: [place] [facility];  Service: Cardiovascular;  Laterality: N/A;   CORONARY ATHERECTOMY N/A 11/29/2017   Procedure: CORONARY ATHERECTOMY;  Surgeon: Adrian Prows, MD;  Location: [place] [facility];  Service: Cardiovascular;  Laterality: N/A;   CORONARY CTO INTERVENTION N/A 08/30/2017   Procedure: CORONARY CTO INTERVENTION;  Surgeon: Adrian Prows, MD;  Location: [place] [facility];  Service: Cardiovascular;  Laterality: N/A;   CORONARY STENT INTERVENTION N/A 11/29/2017   Procedure: CORONARY STENT INTERVENTION;  Surgeon: Adrian Prows, MD;  Location: [place] [facility];  Service: Cardiovascular;  Laterality: N/A;   INSERT / REPLACE / REMOVE PACEMAKER  ?2012   Now currently has Medtronic Pacemaker.  "I think it was placed in 2012"   KNEE ARTHROSCOPY Left 04/03/2020   Procedure: LEFT KNEE ARTHROSCOPY  WASHOUT;  Surgeon: Tania Ade, MD;  Location: [facility];  Service: Orthopedics;  Laterality: Left;   LEFT HEART CATH AND CORONARY ANGIOGRAPHY N/A 08/30/2017   Procedure: LEFT HEART CATH AND CORONARY ANGIOGRAPHY;  Surgeon: Adrian Prows, MD;  Location: [place] [facility];  Service: Cardiovascular;  Laterality: N/A;   LEFT HEART CATH AND CORS/GRAFTS ANGIOGRAPHY N/A 08/05/2017   Procedure: LEFT HEART CATH AND CORS/GRAFTS ANGIOGRAPHY;  Surgeon: Adrian Prows, MD;  Location: [place] [facility];  Service: Cardiovascular;  Laterality: N/A;   LEFT HEART CATH AND CORS/GRAFTS ANGIOGRAPHY N/A 06/30/2021   Procedure: LEFT  HEART CATH AND CORS/GRAFTS ANGIOGRAPHY;  Surgeon: Adrian Prows, MD;  Location: [place] [facility];  Service: Cardiovascular;  Laterality: N/A;   POSTERIOR CERVICAL LAMINECTOMY N/A 04/16/2022   Procedure: POSTERIOR CERVICAL LAMINECTOMY, CERVICAL THREE-CERVICAL FOUR;  Surgeon: Consuella Lose, MD;  Location: [place];  Service: Neurosurgery;  Laterality: N/A;  3C   PPM GENERATOR CHANGEOUT N/A 06/17/2021   Procedure: PPM GENERATOR CHANGEOUT;  Surgeon: Evans Lance, MD;  Location: [place] [facility];  Service: Cardiovascular;  Laterality: N/A;   Patient Active Problem List   Diagnosis Date Noted   Stenosis of cervical spine with myelopathy ([place]) 04/16/2022   Enrolled in clinical trial: OCEANIC-AF (Asundexian - factor XIa inhibitor PO BID vs Apixaban PO BID in patients with A. Fib for stroke prevention. 03/26/2022   PAF (paroxysmal atrial fibrillation) ([place]). CHA2DS2-VASc Score is 5 (A, HTN, DM Vasc Dz). 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 12/15/2018   Flu-like symptoms 09/04/2018   Troponin level elevated 09/02/2018   CAD (coronary artery disease), native coronary artery 08/30/2018   Essential hypertension    Mixed hyperlipidemia    Angina pectoris ([place]) 02/03/2015   Post PTCA 02/03/2015   Diabetes mellitus ([place]) 02/03/2015   Sick sinus syndrome ([place])    Pacemaker Medtronic dual chamber AzureT XT DR MRI SureScan Gen change 06/17/21 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 Since surgery, pt reports that his balance and ambulation has improved some, but he is still having some difficulty walking. Pt reports that he is having weakness in bilat hands and feet. Pt reports numbness and no strength in B hand, stating that he has "itrouble picking stuff up". Pt accompanied by: self  PERTINENT HISTORY: Angina, CAD, Hx of MI in 2016, Pacemaker placement in  2007, s/p cervical laminectomy 04/16/22, polio as a child  PRECAUTIONS: Cervical, Fall, and ICD/Pacemaker - recent cervical laminectomy  WEIGHT BEARING RESTRICTIONS: No  PAIN:  Are you having pain? No  FALLS: Has patient fallen in last 6 months? No  LIVING ENVIRONMENT: Lives with: lives with their spouse Lives in: House/apartment Stairs: Yes: Internal: full flight upstairs, but does not typically go upstairs as bedroom/bathroom on main floor and External: 3 steps; can reach both Has following equipment at home: Gilford Rile - 4 wheeled, Shower bench, and Grab bars  PLOF: Independent  PATIENT GOALS: to have better use of hands  OBJECTIVE:   HAND DOMINANCE: Ambidextrous, learned to use left hand as child as his RUE was effected by polio  ADLs: Transfers/ambulation related to ADLs: Mod I Eating: Mod I Grooming: Mod I, utilizing AE to open containers UB Dressing: Mod I LB Dressing: Mod I Toileting: Mod I Bathing: Mod I, uses built in bench as needed for bathing Tub Shower transfers: Mod I Equipment: Grab bars, Walk in shower, and built in bench in shower  IADLs: Community mobility: Currently driving Medication management: Mod I Financial management: Mod I Handwriting: 100%  legible and Increased time  MOBILITY STATUS: Independent  POSTURE COMMENTS:  Right shoulder higher than left with side lean noted  ACTIVITY TOLERANCE: Activity tolerance: WNL for tasks assessed on eval  FUNCTIONAL OUTCOME MEASURES: Quick Dash: 31.8  UPPER EXTREMITY ROM: Pt reports polio impacted L upper arm more than R upper arm, but R hand impacted significantly more and with no L hand impairments from polio.  Active ROM Right eval Left eval  Shoulder flexion WFL   82  Shoulder abduction    Shoulder adduction 115 78  Shoulder extension    Shoulder internal rotation WFL 90%  Shoulder external rotation WFL 90%  Elbow flexion [facility] WFL  Elbow extension [facility] WFL  Wrist flexion    Wrist extension     Wrist ulnar deviation    Wrist radial deviation    Wrist pronation    Wrist supination    (Blank rows = not tested)  UPPER EXTREMITY MMT:     MMT Right eval Left eval  Shoulder flexion 3+/5 3-/5   Shoulder abduction    Shoulder adduction    Shoulder extension    Shoulder internal rotation    Shoulder external rotation    Middle trapezius    Lower trapezius    Elbow flexion 4-/5 3/5  Elbow extension 4-/5 3-/5  Wrist flexion    Wrist extension    Wrist ulnar deviation    Wrist radial deviation    Wrist pronation    Wrist supination    (Blank rows = not tested)  HAND FUNCTION: Grip strength: Right: 10 lbs; Left: 45 lbs, Lateral pinch: Right: 2 lbs, Left: 12 lbs, and 3 point pinch: Right: 0 lbs, Left: 8 lbs  COORDINATION: Finger Nose Finger test: moderate dysmetria on R, minimal dysmetria on L (completed half as many on R as L in same time frame) 9 Hole Peg test: Right: 1:29.34 (utilized LUE to pick up and place pegs in to R hand d/t polio and removed pegs by grasping between 3-4 digit) sec; Left: 1:25.69 sec Box and Blocks:  Right 31blocks, Left 41blocks  SENSATION: Reports numbness and tingling in hands and feet  COGNITION: Overall cognitive status: Within functional limits for tasks assessed  VISION: Baseline vision: Bifocals and Wears glasses all the time Visual history:  takes drops for his eyes as the pressure is high    TODAY'S TREATMENT:                                                                         DATE:  N/A  PATIENT EDUCATION: Education details: Educated on role and purpose of OT as well as potential interventions and goals for therapy based on initial evaluation findings. Person educated: Patient Education method: Explanation Education comprehension: verbalized understanding  HOME EXERCISE PROGRAM: TBD   GOALS: Goals reviewed with patient? No  SHORT TERM GOALS: Target date: 06/18/22  Pt will be independent with HEP for fine and  gross motor coordination and strengthening. Baseline: Goal status: INITIAL  2.  Pt will verbalize understanding of task modifications and/or potential AE needs to increase ease, safety, and independence w/ ADLs. Baseline:  Goal status: INITIAL   LONG TERM GOALS: Target date: 07/16/22  Pt will demonstrate improved L  shoulder flexion to retrieve light weight object from cabinet at moderate height with use of BUE as needed. Baseline:  Goal status: INITIAL  2.  Pt will demonstrate improved fine motor coordination for ADLs as evidenced by decreasing 9 hole peg test score for LUE by 10 sec and RUE by 5 secs Baseline: 9 Hole Peg test: Right: 1:29.34 (utilized LUE to pick up and place pegs in to R hand d/t polio and removed pegs by grasping between 3-4 digit) sec; Left: 1:25.69 sec Goal status: INITIAL  3.  Pt will demonstrate improved ability to open containers and cut food with use of AE as needed. Baseline:  Goal status: INITIAL  4.  Pt will report improved functional use of BUE as demonstrated by improved score on QuickDash to </= to 25 Baseline: 31.8 Goal status: INITIAL   ASSESSMENT:  CLINICAL IMPRESSION: Patient is a 81 y.o. male who was seen today for occupational therapy evaluation for BUE weakness and impaired coordination s/p cervical laminectomy. Pt does have h/o polio which impacted BUE - L upper arm more than R upper arm, but R hand impacted significantly more and with no L hand impairments from polio.  Pt currently lives with spouse in a 2 story home with ability to remain on main level with bedroom/bathroom and 3 steps to enter with B rails.  Pt will benefit from skilled occupational therapy services to address strength and coordination, ROM, pain management, altered sensation, balance, GM/FM control, safety awareness, introduction of compensatory strategies/AE prn, and implementation of an HEP to improve participation and safety during ADLs and IADLs.   PERFORMANCE DEFICITS:  in functional skills including ADLs, IADLs, coordination, dexterity, sensation, ROM, strength, pain, flexibility, Fine motor control, Gross motor control, balance, body mechanics, decreased knowledge of use of DME, and UE functional use and psychosocial skills including environmental adaptation.   IMPAIRMENTS: are limiting patient from ADLs and IADLs.   CO-MORBIDITIES; may have co-morbidities  that affects occupational performance. Patient will benefit from skilled OT to address above impairments and improve overall function.  MODIFICATION OR ASSISTANCE TO COMPLETE EVALUATION: Min-Moderate modification of tasks or assist with assess necessary to complete an evaluation.  OT OCCUPATIONAL PROFILE AND HISTORY: Detailed assessment: Review of records and additional review of physical, cognitive, psychosocial history related to current functional performance.  CLINICAL DECISION MAKING: Moderate - several treatment options, min-mod task modification necessary  REHAB POTENTIAL: Good  EVALUATION COMPLEXITY: Moderate    PLAN:  OT FREQUENCY: 1-2x/week  OT DURATION: 8 weeks  PLANNED INTERVENTIONS: self care/ADL training, therapeutic exercise, therapeutic activity, neuromuscular re-education, manual therapy, passive range of motion, functional mobility training, splinting, moist heat, cryotherapy, patient/family education, psychosocial skills training, energy conservation, coping strategies training, and DME and/or AE instructions  RECOMMENDED OTHER SERVICES: NA  CONSULTED AND AGREED WITH PLAN OF CARE: Patient  PLAN FOR NEXT SESSION: Initiate coordination and strengthening HEP (both hands and L shoulder).  Educate on AE for grasp and opening containers.   Simonne Come, OTR/L 05/19/2022, 1:01 PM

## 2022-05-21 ENCOUNTER — Encounter: Payer: Self-pay | Admitting: Rehabilitative and Restorative Service Providers"

## 2022-05-21 ENCOUNTER — Ambulatory Visit: Payer: Medicare HMO | Admitting: Rehabilitative and Restorative Service Providers"

## 2022-05-21 DIAGNOSIS — M542 Cervicalgia: Secondary | ICD-10-CM | POA: Diagnosis not present

## 2022-05-21 DIAGNOSIS — M6281 Muscle weakness (generalized): Secondary | ICD-10-CM | POA: Diagnosis not present

## 2022-05-21 DIAGNOSIS — R2689 Other abnormalities of gait and mobility: Secondary | ICD-10-CM

## 2022-05-21 DIAGNOSIS — R252 Cramp and spasm: Secondary | ICD-10-CM | POA: Diagnosis not present

## 2022-05-21 DIAGNOSIS — R278 Other lack of coordination: Secondary | ICD-10-CM | POA: Diagnosis not present

## 2022-05-21 DIAGNOSIS — R208 Other disturbances of skin sensation: Secondary | ICD-10-CM | POA: Diagnosis not present

## 2022-05-21 DIAGNOSIS — M4802 Spinal stenosis, cervical region: Secondary | ICD-10-CM | POA: Diagnosis not present

## 2022-05-21 NOTE — Therapy (Signed)
OUTPATIENT PHYSICAL THERAPY TREATMENT NOTE   Patient Name: Jesse [name] MRN: [id number] DOB:June 28, 1941, 81 y.o., male Today's Date: 05/21/2022   PT End of Session - 05/21/22 0934     Visit Number 3    Date for PT Re-Evaluation 07/02/22    Authorization Type [employer]    Progress Note Due on Visit 10    PT Start Time 0930    PT Stop Time 1010    PT Time Calculation (min) 40 min    Activity Tolerance Patient tolerated treatment well    Behavior During Therapy [facility] for tasks assessed/performed             Past Medical History:  Diagnosis Date   Angina pectoris ([place]) 02/03/2015   CAD (coronary artery disease), native coronary artery 08/30/2018   2007 [place], [place]: LIMA to LAD, Occluded SVG to RCA and OM by angiogram S/P  PCI/orbital atherectomy S/P 2.5x15 Resoluute to prox Cx on 11/29/2017.   Coronary artery disease    High cholesterol    Hypertension    Myocardial infarction [facility]) 2017/2018?   NSTEMI (non-ST elevated myocardial infarction) ([place]) 02/03/2015   Presence of permanent cardiac pacemaker    Medtronic   Type II diabetes mellitus ([place])    Past Surgical History:  Procedure Laterality Date   CARDIAC CATHETERIZATION N/A 02/04/2015   Procedure: Left Heart Cath and Cors/Grafts Angiography;  Surgeon: Adrian Prows, MD;  Location: [place] [facility];  Service: Cardiovascular;  Laterality: N/A;   CARDIAC CATHETERIZATION N/A 02/04/2015   Procedure: Coronary Stent Intervention;  Surgeon: Adrian Prows, MD;  Location: [place] [facility];  Service: Cardiovascular;  Laterality: N/A;   COLONOSCOPY     several   CORONARY ANGIOGRAPHY N/A 10/18/2017   Procedure: CORONARY ANGIOGRAPHY;  Surgeon: Adrian Prows, MD;  Location: [place] [facility];  Service: Cardiovascular;  Laterality: N/A;   CORONARY ANGIOPLASTY     CORONARY ANGIOPLASTY WITH STENT PLACEMENT     "i've got 5-6 stents total" (08/30/2017)   CORONARY ARTERY BYPASS GRAFT  2007   CABG X3   CORONARY ATHERECTOMY N/A 10/18/2017    Procedure: CORONARY ATHERECTOMY;  Surgeon: Adrian Prows, MD;  Location: [place] [facility];  Service: Cardiovascular;  Laterality: N/A;   CORONARY ATHERECTOMY N/A 11/29/2017   Procedure: CORONARY ATHERECTOMY;  Surgeon: Adrian Prows, MD;  Location: [place] [facility];  Service: Cardiovascular;  Laterality: N/A;   CORONARY CTO INTERVENTION N/A 08/30/2017   Procedure: CORONARY CTO INTERVENTION;  Surgeon: Adrian Prows, MD;  Location: [place] [facility];  Service: Cardiovascular;  Laterality: N/A;   CORONARY STENT INTERVENTION N/A 11/29/2017   Procedure: CORONARY STENT INTERVENTION;  Surgeon: Adrian Prows, MD;  Location: [place] [facility];  Service: Cardiovascular;  Laterality: N/A;   INSERT / REPLACE / REMOVE PACEMAKER  ?2012   Now currently has Medtronic Pacemaker.  "I think it was placed in 2012"   KNEE ARTHROSCOPY Left 04/03/2020   Procedure: LEFT KNEE ARTHROSCOPY  WASHOUT;  Surgeon: Tania Ade, MD;  Location: [facility];  Service: Orthopedics;  Laterality: Left;   LEFT HEART CATH AND CORONARY ANGIOGRAPHY N/A 08/30/2017   Procedure: LEFT HEART CATH AND CORONARY ANGIOGRAPHY;  Surgeon: Adrian Prows, MD;  Location: [place] [facility];  Service: Cardiovascular;  Laterality: N/A;   LEFT HEART CATH AND CORS/GRAFTS ANGIOGRAPHY N/A 08/05/2017   Procedure: LEFT HEART CATH AND CORS/GRAFTS ANGIOGRAPHY;  Surgeon: Adrian Prows, MD;  Location: [place] [facility];  Service: Cardiovascular;  Laterality: N/A;   LEFT  HEART CATH AND CORS/GRAFTS ANGIOGRAPHY N/A 06/30/2021   Procedure: LEFT HEART CATH AND CORS/GRAFTS ANGIOGRAPHY;  Surgeon: Adrian Prows, MD;  Location: [place] [facility];  Service: Cardiovascular;  Laterality: N/A;   POSTERIOR CERVICAL LAMINECTOMY N/A 04/16/2022   Procedure: POSTERIOR CERVICAL LAMINECTOMY, CERVICAL THREE-CERVICAL FOUR;  Surgeon: Consuella Lose, MD;  Location: [place];  Service: Neurosurgery;  Laterality: N/A;  3C   PPM GENERATOR CHANGEOUT N/A 06/17/2021   Procedure: PPM GENERATOR CHANGEOUT;   Surgeon: Evans Lance, MD;  Location: [place] [facility];  Service: Cardiovascular;  Laterality: N/A;   Patient Active Problem List   Diagnosis Date Noted   Stenosis of cervical spine with myelopathy ([place]) 04/16/2022   Enrolled in clinical trial: OCEANIC-AF (Asundexian - factor XIa inhibitor PO BID vs Apixaban PO BID in patients with A. Fib for stroke prevention. 03/26/2022   PAF (paroxysmal atrial fibrillation) ([place]). CHA2DS2-VASc Score is 5 (A, HTN, DM Vasc Dz). 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 12/15/2018   Flu-like symptoms 09/04/2018   Troponin level elevated 09/02/2018   CAD (coronary artery disease), native coronary artery 08/30/2018   Essential hypertension    Mixed hyperlipidemia    Angina pectoris ([place]) 02/03/2015   Post PTCA 02/03/2015   Diabetes mellitus ([place]) 02/03/2015   Sick sinus syndrome [facility])    Pacemaker Medtronic dual chamber AzureT XT DR MRI SureScan Gen change 06/17/21 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    PCP: Deland Pretty, MD  REFERRING PROVIDER: Consuella Lose, MD   REFERRING DIAG: [phone] (ICD-10-CM) - Spinal stenosis, cervical region   THERAPY DIAG:  Cervicalgia  Muscle weakness (generalized)  Other abnormalities of gait and mobility  Cramp and spasm  Rationale for Evaluation and Treatment Rehabilitation  ONSET DATE: s/p cervical laminectomy on 04/16/2022  SUBJECTIVE:                                                                                                                                                                                                         SUBJECTIVE STATEMENT: Pt reports that he is having some weakness this morning, but he did his exercises this morning.  PERTINENT HISTORY:  Angina, CAD, Hx of MI  in 2016, Pacemaker placement in 2007, s/p cervical laminectomy 04/16/22, polio as a child  PAIN:  Are you having pain? Yes: NPRS scale:  discomfort 3/10 Pain  location: bilateral hands Pain description: discomfort numbness Aggravating factors: unknown Relieving factors: unknown  PRECAUTIONS: Cervical, Fall, and ICD/Pacemaker recent cervical laminectomy  WEIGHT BEARING RESTRICTIONS No  FALLS:  Has patient fallen in last 6 months? No  LIVING ENVIRONMENT: Lives with: lives with their spouse Lives in: House/apartment Stairs: Yes: External: 3 steps; can reach both Has following equipment at home: Gilford Rile - 4 wheeled, shower chair, and Grab bars  OCCUPATION: Retired Theatre manager  PLOF: Independent and Leisure: Museum/gallery curator, model train  PATIENT GOALS:  To walk normally again.  OBJECTIVE:   DIAGNOSTIC FINDINGS:  04/16/22 Cervical Radiographs IMPRESSION: Localization film for C3-C4 posterior laminectomy. Surgical instruments overlying the soft tissues posterior to the C2 through C4 spinous processes.  PATIENT SURVEYS:  05/11/2022:  FOTO 51% (projected 60% by visit 12)   COGNITION: Overall cognitive status: Within functional limits for tasks assessed   SENSATION: Reports numbness and tingling in hands and feet  POSTURE:  Right shoulder higher than left with side lean noted  PALPATION: Some muscle spasms noted in cervical region   CERVICAL ROM:   Active ROM A/ROM (deg) eval  Flexion 40  Extension 10  Right lateral flexion 15  Left lateral flexion 10  Right rotation 30  Left rotation 19   (Blank rows = not tested)  UPPER EXTREMITY ROM:  Active ROM Right eval Left eval  Shoulder flexion 126 105  Shoulder extension    Shoulder abduction 115 85  Shoulder adduction    Shoulder internal rotation    Shoulder external rotation    Elbow flexion    Elbow extension    Wrist flexion    Wrist extension    Wrist ulnar deviation    Wrist radial deviation     Wrist pronation    Wrist supination     (Blank rows = not tested)  UPPER EXTREMITY AND LOWER EXTREMITY MMT: 05/11/2022:   Right UE strength of 4-/5, Left UE strength of 3/5 Bilateral LE strength of 4/5 grossly throughout  FUNCTIONAL TESTS:  05/11/2022: 5 times sit to stand: 15.3 sec Timed up and go (TUG): 13.4 sec  05/19/2022: 3 minute walk test:  451 ft without assistive device   TODAY'S TREATMENT:  Date:  05/21/2022 Nustep level 3 x6 min with PT present to discuss status Seated green pball rollout 2x10 to encourage shoulder flexion Seated with 2#:  marching, LAQ, hip abduction scissors.  2x10 bilat each Seated lumbar/thoracic extension 2x10 Seated hip adduction ball squeeze 2x10 Seated clamshells with red tband 2x10 Seated hamstring curls with red tband 2x10 bilat Seated shoulder ER and horizontal abduction with red tband 2x10 (pt with difficulty with LUE during horizontal abduction) Manual therapy to bilateral cervical paraspinals (with gentle pressure avoiding surgical incision), upper traps, and rhomboids  Date:  05/19/2022 Nustep level 3 x5 min with PT present to discuss status Cervical rotation and cervical extension 2x10 each Seated scapular retraction 2x10 Seated with 2#:  marching, LAQ, hip abduction scissors.  2x10 bilat each Seated hip adduction ball squeeze 2x10 3 minute ambulation without assistive device Seated green pball rollout 2x10 to encourage shoulder flexion Sit to/from stand x10    PATIENT EDUCATION:  Education details: Issued HEP Person educated: Patient Education method: Consulting civil engineer, Verbal cues, and Handouts Education comprehension: verbalized understanding and returned demonstration   HOME EXERCISE PROGRAM: Access Code: [email or web address] URL: [email or web address] Date: 05/11/2022 Prepared by: Shelby Dubin Shirle Provencal  Exercises - Seated Cervical Rotation AROM  - 1 x daily - 7 x weekly - 2  sets - 10 reps - Seated Cervical Extension AROM   - 1 x daily - 7 x weekly - 2 sets - 10 reps - Seated Scapular Retraction  - 1 x daily - 7 x weekly - 2 sets - 10 reps - Seated March  - 1 x daily - 7 x weekly - 2 sets - 10 reps - Seated Long Arc Quad  - 1 x daily - 7 x weekly - 2 sets - 10 reps  ASSESSMENT:  CLINICAL IMPRESSION: Mr Rottenberg presents to skilled PT with some weakness reported and a "catch" reported in his neck.  Pt able to progress with exercises in session with minimal recovery periods required.  Following manual therapy, pt reports that his neck was feeling better and he no longer felt the "catch".  Pt with muscle atrophy noted with manual therapy.   OBJECTIVE IMPAIRMENTS decreased activity tolerance, decreased balance, difficulty walking, decreased ROM, decreased strength, increased muscle spasms, impaired flexibility, impaired UE functional use, postural dysfunction, and pain.   ACTIVITY LIMITATIONS carrying, stairs, transfers, and reach over head  PARTICIPATION LIMITATIONS: cleaning, driving, and community activity  PERSONAL FACTORS Age and 3+ comorbidities: s/p cervical laminectomy on 04/16/22, polio in childhood, s/p Pacemaker placement, DM, Angina  are also affecting patient's functional outcome.   REHAB POTENTIAL: Good  CLINICAL DECISION MAKING: Evolving/moderate complexity  EVALUATION COMPLEXITY: Moderate   GOALS: Goals reviewed with patient? Yes  SHORT TERM GOALS: Target date: 06/01/2022   Pt will be independent with initial HEP. Baseline:  Goal status: MET  2.  Pt will report at least a 30% improvement in symptoms since starting PT. Baseline:  Goal status: INITIAL   LONG TERM GOALS: Target date: 07/02/2022  Pt will be independent with advanced HEP. Baseline:  Goal status: INITIAL  2.  Pt will increase FOTO to at least 60% to demonstrate improvements in functional mobility. Baseline: 51% Goal status: INITIAL  3.  Pt will increase UE and LE bilateral strength to at least 4+/5 to allow him to  more easily perform household tasks. Baseline: see above Goal status: INITIAL  4.  Pt will report ability to ambulate for at least 20 min without any increased pain or loss of balance to allow for community reintegration. Baseline:  Goal status: INITIAL  5.  Pt will increase cervical A/ROM to [facility] to allow him to drive without difficulty or increased pain. Baseline: see above Goal status: INITIAL    PLAN: PT FREQUENCY: 2x/week  PT DURATION: 8 weeks  PLANNED INTERVENTIONS: Therapeutic exercises, Therapeutic activity, Neuromuscular re-education, Balance training, Gait training, Patient/Family education, Self Care, Joint mobilization, Joint manipulation, Stair training, Aquatic Therapy, Dry Needling, Spinal manipulation, Spinal mobilization, Cryotherapy, Moist heat, scar mobilization, Taping, Ultrasound, Ionotophoresis 20m/ml Dexamethasone, Manual therapy, and Re-evaluation  PLAN FOR NEXT SESSION: progress and assess HEP, strengthening, A/ROM, balance   SJuel Burrow PT 05/21/2022, 10:14 AM   [facility] [place] [id number] # [phone] [phone]

## 2022-05-24 NOTE — Therapy (Deleted)
OUTPATIENT PHYSICAL THERAPY TREATMENT NOTE   Patient Name: Jesse [name] MRN: [id number] DOB:06-10-1941, 81 y.o., male Today's Date: 05/24/2022     Past Medical History:  Diagnosis Date   Angina pectoris ([place]) 02/03/2015   CAD (coronary artery disease), native coronary artery 08/30/2018   2007 [place], [place]: LIMA to LAD, Occluded SVG to RCA and OM by angiogram S/P  PCI/orbital atherectomy S/P 2.5x15 Resoluute to prox Cx on 11/29/2017.   Coronary artery disease    High cholesterol    Hypertension    Myocardial infarction [facility]) 2017/2018?   NSTEMI (non-ST elevated myocardial infarction) ([place]) 02/03/2015   Presence of permanent cardiac pacemaker    Medtronic   Type II diabetes mellitus ([place])    Past Surgical History:  Procedure Laterality Date   CARDIAC CATHETERIZATION N/A 02/04/2015   Procedure: Left Heart Cath and Cors/Grafts Angiography;  Surgeon: Adrian Prows, MD;  Location: [place] [facility];  Service: Cardiovascular;  Laterality: N/A;   CARDIAC CATHETERIZATION N/A 02/04/2015   Procedure: Coronary Stent Intervention;  Surgeon: Adrian Prows, MD;  Location: [place] [facility];  Service: Cardiovascular;  Laterality: N/A;   COLONOSCOPY     several   CORONARY ANGIOGRAPHY N/A 10/18/2017   Procedure: CORONARY ANGIOGRAPHY;  Surgeon: Adrian Prows, MD;  Location: [place] [facility];  Service: Cardiovascular;  Laterality: N/A;   CORONARY ANGIOPLASTY     CORONARY ANGIOPLASTY WITH STENT PLACEMENT     "i've got 5-6 stents total" (08/30/2017)   CORONARY ARTERY BYPASS GRAFT  2007   CABG X3   CORONARY ATHERECTOMY N/A 10/18/2017   Procedure: CORONARY ATHERECTOMY;  Surgeon: Adrian Prows, MD;  Location: [place] [facility];  Service: Cardiovascular;  Laterality: N/A;   CORONARY ATHERECTOMY N/A 11/29/2017   Procedure: CORONARY ATHERECTOMY;  Surgeon: Adrian Prows, MD;  Location: [place] [facility];  Service: Cardiovascular;  Laterality: N/A;   CORONARY CTO INTERVENTION N/A 08/30/2017   Procedure: CORONARY  CTO INTERVENTION;  Surgeon: Adrian Prows, MD;  Location: [place] [facility];  Service: Cardiovascular;  Laterality: N/A;   CORONARY STENT INTERVENTION N/A 11/29/2017   Procedure: CORONARY STENT INTERVENTION;  Surgeon: Adrian Prows, MD;  Location: [place] [facility];  Service: Cardiovascular;  Laterality: N/A;   INSERT / REPLACE / REMOVE PACEMAKER  ?2012   Now currently has Medtronic Pacemaker.  "I think it was placed in 2012"   KNEE ARTHROSCOPY Left 04/03/2020   Procedure: LEFT KNEE ARTHROSCOPY  WASHOUT;  Surgeon: Tania Ade, MD;  Location: [facility];  Service: Orthopedics;  Laterality: Left;   LEFT HEART CATH AND CORONARY ANGIOGRAPHY N/A 08/30/2017   Procedure: LEFT HEART CATH AND CORONARY ANGIOGRAPHY;  Surgeon: Adrian Prows, MD;  Location: [place] [facility];  Service: Cardiovascular;  Laterality: N/A;   LEFT HEART CATH AND CORS/GRAFTS ANGIOGRAPHY N/A 08/05/2017   Procedure: LEFT HEART CATH AND CORS/GRAFTS ANGIOGRAPHY;  Surgeon: Adrian Prows, MD;  Location: [place] [facility];  Service: Cardiovascular;  Laterality: N/A;   LEFT HEART CATH AND CORS/GRAFTS ANGIOGRAPHY N/A 06/30/2021   Procedure: LEFT HEART CATH AND CORS/GRAFTS ANGIOGRAPHY;  Surgeon: Adrian Prows, MD;  Location: [place] [facility];  Service: Cardiovascular;  Laterality: N/A;   POSTERIOR CERVICAL LAMINECTOMY N/A 04/16/2022   Procedure: POSTERIOR CERVICAL LAMINECTOMY, CERVICAL THREE-CERVICAL FOUR;  Surgeon: Consuella Lose, MD;  Location: [place];  Service: Neurosurgery;  Laterality: N/A;  3C   PPM GENERATOR CHANGEOUT N/A 06/17/2021   Procedure: PPM GENERATOR CHANGEOUT;  Surgeon: Evans Lance, MD;  Location: [facility]  [facility];  Service: Cardiovascular;  Laterality: N/A;   Patient Active Problem List   Diagnosis Date Noted   Stenosis of cervical spine with myelopathy ([place]) 04/16/2022   Enrolled in clinical trial: OCEANIC-AF (Asundexian - factor XIa inhibitor PO BID vs Apixaban PO BID in patients with A. Fib for stroke prevention.  03/26/2022   PAF (paroxysmal atrial fibrillation) ([place]). CHA2DS2-VASc Score is 5 (A, HTN, DM Vasc Dz). 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 12/15/2018   Flu-like symptoms 09/04/2018   Troponin level elevated 09/02/2018   CAD (coronary artery disease), native coronary artery 08/30/2018   Essential hypertension    Mixed hyperlipidemia    Angina pectoris ([place]) 02/03/2015   Post PTCA 02/03/2015   Diabetes mellitus ([place]) 02/03/2015   Sick sinus syndrome [facility])    Pacemaker Medtronic dual chamber AzureT XT DR MRI SureScan Gen change 06/17/21 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  Rationale for Evaluation and Treatment Rehabilitation  ONSET DATE: s/p cervical laminectomy on 04/16/2022  SUBJECTIVE:                                                                                                                                                                                                         SUBJECTIVE STATEMENT: Pt reports that he is having some weakness this morning, but he did his exercises this morning.  PERTINENT HISTORY:  Angina, CAD, Hx of MI in 2016, Pacemaker placement in 2007, s/p cervical laminectomy 04/16/22, polio as a child  PAIN:  Are you having pain? Yes: NPRS scale:  discomfort 3/10 Pain location: bilateral hands Pain description: discomfort numbness Aggravating factors: unknown Relieving factors: unknown  PRECAUTIONS: Cervical, Fall, and ICD/Pacemaker recent cervical laminectomy  WEIGHT BEARING RESTRICTIONS No  FALLS:  Has patient fallen in last 6 months? No  LIVING  ENVIRONMENT: Lives with: lives with their spouse Lives in: House/apartment Stairs: Yes: External: 3 steps; can reach both Has following equipment at home: Gilford Rile - 4 wheeled, shower chair, and Grab bars  OCCUPATION: Retired Theatre manager  PLOF: Independent and Leisure: Museum/gallery curator, model train  PATIENT GOALS:  To walk normally again.  OBJECTIVE:   DIAGNOSTIC FINDINGS:  04/16/22  Cervical Radiographs IMPRESSION: Localization film for C3-C4 posterior laminectomy. Surgical instruments overlying the soft tissues posterior to the C2 through C4 spinous processes.  PATIENT SURVEYS:  05/11/2022:  FOTO 51% (projected 60% by visit 12)   COGNITION: Overall cognitive status: Within functional limits for tasks assessed   SENSATION: Reports numbness and tingling in hands and feet  POSTURE:  Right shoulder higher than left with side lean noted  PALPATION: Some muscle spasms noted in cervical region   CERVICAL ROM:   Active ROM A/ROM (deg) eval  Flexion 40  Extension 10  Right lateral flexion 15  Left lateral flexion 10  Right rotation 30  Left rotation 19   (Blank rows = not tested)  UPPER EXTREMITY ROM:  Active ROM Right eval Left eval  Shoulder flexion 126 105  Shoulder extension    Shoulder abduction 115 85  Shoulder adduction    Shoulder internal rotation    Shoulder external rotation    Elbow flexion    Elbow extension    Wrist flexion    Wrist extension    Wrist ulnar deviation    Wrist radial deviation    Wrist pronation    Wrist supination     (Blank rows = not tested)  UPPER EXTREMITY AND LOWER EXTREMITY MMT: 05/11/2022:   Right UE strength of 4-/5, Left UE strength of 3/5 Bilateral LE strength of 4/5 grossly throughout  FUNCTIONAL TESTS:  05/11/2022: 5 times sit to stand: 15.3 sec Timed up and go (TUG): 13.4 sec  05/19/2022: 3 minute walk test:  451 ft without assistive device   TODAY'S TREATMENT:  Date:  05/21/2022 Nustep level 3 x6  min with PT present to discuss status Seated green pball rollout 2x10 to encourage shoulder flexion Seated with 2#:  marching, LAQ, hip abduction scissors.  2x10 bilat each Seated lumbar/thoracic extension 2x10 Seated hip adduction ball squeeze 2x10 Seated clamshells with red tband 2x10 Seated hamstring curls with red tband 2x10 bilat Seated shoulder ER and horizontal abduction with red tband 2x10 (pt with difficulty with LUE during horizontal abduction) Manual therapy to bilateral cervical paraspinals (with gentle pressure avoiding surgical incision), upper traps, and rhomboids  Date:  05/19/2022 Nustep level 3 x5 min with PT present to discuss status Cervical rotation and cervical extension 2x10 each Seated scapular retraction 2x10 Seated with 2#:  marching, LAQ, hip abduction scissors.  2x10 bilat each Seated hip adduction ball squeeze 2x10 3 minute ambulation without assistive device Seated green pball rollout 2x10 to encourage shoulder flexion Sit to/from stand x10    PATIENT EDUCATION:  Education details: Issued HEP Person educated: Patient Education method: Consulting civil engineer, Verbal cues, and Handouts Education comprehension: verbalized understanding and returned demonstration   HOME EXERCISE PROGRAM: Access Code: [email or web address] URL: [email or web address] Date: 05/11/2022 Prepared by: Shelby Dubin Menke  Exercises - Seated Cervical Rotation AROM  - 1 x daily - 7 x weekly - 2 sets - 10 reps - Seated Cervical Extension AROM  - 1 x daily - 7 x weekly - 2 sets - 10 reps - Seated Scapular Retraction  - 1 x daily - 7 x weekly - 2 sets - 10 reps - Seated March  - 1 x daily - 7 x weekly - 2 sets - 10 reps - Seated Long Arc Quad  - 1 x daily - 7 x weekly - 2 sets - 10 reps  ASSESSMENT:  CLINICAL IMPRESSION: Mr Yoho presents to skilled PT with some weakness reported and a "catch" reported in his neck.  Pt able to progress with exercises in session with minimal recovery  periods required.  Following manual therapy, pt reports that his neck was feeling better and he no longer felt the "catch".  Pt with muscle atrophy noted with manual therapy.   OBJECTIVE IMPAIRMENTS decreased activity tolerance, decreased balance, difficulty walking, decreased ROM, decreased strength, increased muscle spasms, impaired flexibility, impaired UE functional use, postural dysfunction, and pain.   ACTIVITY LIMITATIONS carrying, stairs, transfers, and reach over head  PARTICIPATION LIMITATIONS: cleaning, driving, and community activity  PERSONAL FACTORS Age and 3+ comorbidities: s/p cervical laminectomy on 04/16/22, polio in childhood, s/p Pacemaker placement, DM, Angina  are also affecting patient's functional outcome.   REHAB POTENTIAL: Good  CLINICAL DECISION MAKING: Evolving/moderate complexity  EVALUATION COMPLEXITY: Moderate   GOALS: Goals reviewed with patient? Yes  SHORT TERM GOALS: Target date: 06/01/2022   Pt will be independent with initial HEP. Baseline:  Goal status: MET  2.  Pt will report at least a 30% improvement in symptoms since starting PT. Baseline:  Goal status: INITIAL   LONG TERM GOALS: Target date: 07/02/2022  Pt will be independent with advanced HEP. Baseline:  Goal status: INITIAL  2.  Pt will increase FOTO to at least 60% to demonstrate improvements in functional mobility. Baseline: 51% Goal status: INITIAL  3.  Pt will increase UE and LE bilateral strength to at least 4+/5 to allow him to more easily perform household tasks. Baseline: see above Goal status: INITIAL  4.  Pt will report ability to ambulate for at least 20 min without any increased pain or loss of balance to allow for community reintegration. Baseline:  Goal status: INITIAL  5.  Pt will increase cervical A/ROM to [facility] to allow him to drive without difficulty or increased pain. Baseline: see above Goal status: INITIAL    PLAN: PT FREQUENCY: 2x/week  PT  DURATION: 8 weeks  PLANNED INTERVENTIONS: Therapeutic exercises, Therapeutic activity, Neuromuscular re-education, Balance training, Gait training, Patient/Family education, Self Care, Joint mobilization, Joint manipulation, Stair training, Aquatic Therapy, Dry Needling, Spinal manipulation, Spinal mobilization, Cryotherapy, Moist heat, scar mobilization, Taping, Ultrasound, Ionotophoresis 61m/ml Dexamethasone, Manual therapy, and Re-evaluation  PLAN FOR NEXT SESSION: progress and assess HEP, strengthening, A/ROM, balance   SLynden Ang PT 05/24/2022, 1:37 PM   [id number] SSyracuseGConfluence [place] [id number] # [phone] [phone]

## 2022-05-25 ENCOUNTER — Ambulatory Visit: Payer: Medicare HMO | Admitting: Rehabilitative and Restorative Service Providers"

## 2022-05-25 ENCOUNTER — Encounter: Payer: Self-pay | Admitting: Rehabilitative and Restorative Service Providers"

## 2022-05-25 DIAGNOSIS — R278 Other lack of coordination: Secondary | ICD-10-CM | POA: Diagnosis not present

## 2022-05-25 DIAGNOSIS — R252 Cramp and spasm: Secondary | ICD-10-CM | POA: Diagnosis not present

## 2022-05-25 DIAGNOSIS — R2689 Other abnormalities of gait and mobility: Secondary | ICD-10-CM

## 2022-05-25 DIAGNOSIS — M542 Cervicalgia: Secondary | ICD-10-CM | POA: Diagnosis not present

## 2022-05-25 DIAGNOSIS — M6281 Muscle weakness (generalized): Secondary | ICD-10-CM

## 2022-05-25 DIAGNOSIS — R208 Other disturbances of skin sensation: Secondary | ICD-10-CM | POA: Diagnosis not present

## 2022-05-25 DIAGNOSIS — M4802 Spinal stenosis, cervical region: Secondary | ICD-10-CM | POA: Diagnosis not present

## 2022-05-25 NOTE — Therapy (Signed)
OUTPATIENT PHYSICAL THERAPY TREATMENT NOTE   Patient Name: Jesse [name] MRN: [id number] DOB:02/24/1941, 81 y.o., male Today's Date: 05/25/2022   PT End of Session - 05/25/22 1407     Visit Number 4    Date for PT Re-Evaluation 07/02/22    Authorization Type [employer]    Authorization Time Period 05/11/2022 - 07/02/2022    Authorization - Visit Number 4    Authorization - Number of Visits 16    Progress Note Due on Visit 10    PT Start Time 1400    PT Stop Time 1440    PT Time Calculation (min) 40 min    Activity Tolerance Patient tolerated treatment well    Behavior During Therapy [facility] for tasks assessed/performed             Past Medical History:  Diagnosis Date   Angina pectoris ([place]) 02/03/2015   CAD (coronary artery disease), native coronary artery 08/30/2018   2007 [place], [place]: LIMA to LAD, Occluded SVG to RCA and OM by angiogram S/P  PCI/orbital atherectomy S/P 2.5x15 Resoluute to prox Cx on 11/29/2017.   Coronary artery disease    High cholesterol    Hypertension    Myocardial infarction [facility]) 2017/2018?   NSTEMI (non-ST elevated myocardial infarction) ([place]) 02/03/2015   Presence of permanent cardiac pacemaker    Medtronic   Type II diabetes mellitus ([place])    Past Surgical History:  Procedure Laterality Date   CARDIAC CATHETERIZATION N/A 02/04/2015   Procedure: Left Heart Cath and Cors/Grafts Angiography;  Surgeon: Adrian Prows, MD;  Location: [place] [facility];  Service: Cardiovascular;  Laterality: N/A;   CARDIAC CATHETERIZATION N/A 02/04/2015   Procedure: Coronary Stent Intervention;  Surgeon: Adrian Prows, MD;  Location: [place] [facility];  Service: Cardiovascular;  Laterality: N/A;   COLONOSCOPY     several   CORONARY ANGIOGRAPHY N/A 10/18/2017   Procedure: CORONARY ANGIOGRAPHY;  Surgeon: Adrian Prows, MD;  Location: [place] [facility];  Service: Cardiovascular;  Laterality: N/A;   CORONARY ANGIOPLASTY     CORONARY ANGIOPLASTY WITH STENT PLACEMENT      "i've got 5-6 stents total" (08/30/2017)   CORONARY ARTERY BYPASS GRAFT  2007   CABG X3   CORONARY ATHERECTOMY N/A 10/18/2017   Procedure: CORONARY ATHERECTOMY;  Surgeon: Adrian Prows, MD;  Location: [place] [facility];  Service: Cardiovascular;  Laterality: N/A;   CORONARY ATHERECTOMY N/A 11/29/2017   Procedure: CORONARY ATHERECTOMY;  Surgeon: Adrian Prows, MD;  Location: [place] [facility];  Service: Cardiovascular;  Laterality: N/A;   CORONARY CTO INTERVENTION N/A 08/30/2017   Procedure: CORONARY CTO INTERVENTION;  Surgeon: Adrian Prows, MD;  Location: [place] [facility];  Service: Cardiovascular;  Laterality: N/A;   CORONARY STENT INTERVENTION N/A 11/29/2017   Procedure: CORONARY STENT INTERVENTION;  Surgeon: Adrian Prows, MD;  Location: [place] [facility];  Service: Cardiovascular;  Laterality: N/A;   INSERT / REPLACE / REMOVE PACEMAKER  ?2012   Now currently has Medtronic Pacemaker.  "I think it was placed in 2012"   KNEE ARTHROSCOPY Left 04/03/2020   Procedure: LEFT KNEE ARTHROSCOPY  WASHOUT;  Surgeon: Tania Ade, MD;  Location: [facility];  Service: Orthopedics;  Laterality: Left;   LEFT HEART CATH AND CORONARY ANGIOGRAPHY N/A 08/30/2017   Procedure: LEFT HEART CATH AND CORONARY ANGIOGRAPHY;  Surgeon: Adrian Prows, MD;  Location: [place] [facility];  Service: Cardiovascular;  Laterality: N/A;   LEFT HEART CATH AND CORS/GRAFTS ANGIOGRAPHY N/A 08/05/2017   Procedure:  LEFT HEART CATH AND CORS/GRAFTS ANGIOGRAPHY;  Surgeon: Adrian Prows, MD;  Location: [place] [facility];  Service: Cardiovascular;  Laterality: N/A;   LEFT HEART CATH AND CORS/GRAFTS ANGIOGRAPHY N/A 06/30/2021   Procedure: LEFT HEART CATH AND CORS/GRAFTS ANGIOGRAPHY;  Surgeon: Adrian Prows, MD;  Location: [place] [facility];  Service: Cardiovascular;  Laterality: N/A;   POSTERIOR CERVICAL LAMINECTOMY N/A 04/16/2022   Procedure: POSTERIOR CERVICAL LAMINECTOMY, CERVICAL THREE-CERVICAL FOUR;  Surgeon: Consuella Lose, MD;  Location: [place];   Service: Neurosurgery;  Laterality: N/A;  3C   PPM GENERATOR CHANGEOUT N/A 06/17/2021   Procedure: PPM GENERATOR CHANGEOUT;  Surgeon: Evans Lance, MD;  Location: [place] [facility];  Service: Cardiovascular;  Laterality: N/A;   Patient Active Problem List   Diagnosis Date Noted   Stenosis of cervical spine with myelopathy ([place]) 04/16/2022   Enrolled in clinical trial: OCEANIC-AF (Asundexian - factor XIa inhibitor PO BID vs Apixaban PO BID in patients with A. Fib for stroke prevention. 03/26/2022   PAF (paroxysmal atrial fibrillation) ([place]). CHA2DS2-VASc Score is 5 (A, HTN, DM Vasc Dz). 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 12/15/2018   Flu-like symptoms 09/04/2018   Troponin level elevated 09/02/2018   CAD (coronary artery disease), native coronary artery 08/30/2018   Essential hypertension    Mixed hyperlipidemia    Angina pectoris ([place]) 02/03/2015   Post PTCA 02/03/2015   Diabetes mellitus ([place]) 02/03/2015   Sick sinus syndrome [facility])    Pacemaker Medtronic dual chamber AzureT XT DR MRI SureScan Gen change 06/17/21 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    PCP: Deland Pretty, MD  REFERRING PROVIDER: Consuella Lose, MD   REFERRING DIAG: [id number] (ICD-10-CM) - Spinal stenosis, cervical region   THERAPY DIAG:  Cervicalgia  Muscle weakness (generalized)  Other abnormalities of gait and mobility  Cramp and spasm  Rationale for Evaluation and Treatment Rehabilitation  ONSET DATE: s/p cervical laminectomy on 04/16/2022  SUBJECTIVE:                                                                                                                                                                                                         SUBJECTIVE STATEMENT: Pt reports that  he is doing so/so this morning  PERTINENT HISTORY:  Angina, CAD, Hx of MI in 2016, Pacemaker placement in 2007, s/p cervical laminectomy 04/16/22,  polio as a child  PAIN:  Are you having pain? Yes: NPRS scale:  discomfort 3/10 Pain location: bilateral hands Pain description: discomfort numbness Aggravating factors: unknown Relieving factors: unknown  PRECAUTIONS: Cervical, Fall, and ICD/Pacemaker recent cervical laminectomy  WEIGHT BEARING RESTRICTIONS No  FALLS:  Has patient fallen in last 6 months? No  LIVING ENVIRONMENT: Lives with: lives with their spouse Lives in: House/apartment Stairs: Yes: External: 3 steps; can reach both Has following equipment at home: Gilford Rile - 4 wheeled, shower chair, and Grab bars  OCCUPATION: Retired Theatre manager  PLOF: Independent and Leisure: Museum/gallery curator, model train  PATIENT GOALS:  To walk normally again.  OBJECTIVE:   DIAGNOSTIC FINDINGS:  04/16/22 Cervical Radiographs IMPRESSION: Localization film for C3-C4 posterior laminectomy. Surgical instruments overlying the soft tissues posterior to the C2 through C4 spinous processes.  PATIENT SURVEYS:  05/11/2022:  FOTO 51% (projected 60% by visit 12)   COGNITION: Overall cognitive status: Within functional limits for tasks assessed   SENSATION: Reports numbness and tingling in hands and feet  POSTURE:  Right shoulder higher than left with side lean noted  PALPATION: Some muscle spasms noted in cervical region   CERVICAL ROM:   Active ROM A/ROM (deg) eval  Flexion 40  Extension 10  Right lateral flexion 15  Left lateral flexion 10  Right rotation 30  Left rotation 19   (Blank rows = not tested)  UPPER EXTREMITY ROM:  Active ROM Right eval Left eval  Shoulder flexion 126 105  Shoulder extension    Shoulder abduction 115 85  Shoulder adduction    Shoulder internal rotation    Shoulder external rotation    Elbow flexion    Elbow extension    Wrist flexion     Wrist extension    Wrist ulnar deviation    Wrist radial deviation    Wrist pronation    Wrist supination     (Blank rows = not tested)  UPPER EXTREMITY AND LOWER EXTREMITY MMT: 05/11/2022:   Right UE strength of 4-/5, Left UE strength of 3/5 Bilateral LE strength of 4/5 grossly throughout  FUNCTIONAL TESTS:  05/11/2022: 5 times sit to stand: 15.3 sec Timed up and go (TUG): 13.4 sec  05/19/2022: 3 minute walk test:  451 ft without assistive device   TODAY'S TREATMENT:  Date:  05/25/2022 Seated with 3#:  marching, LAQ, hip abduction scissors.  2x10 bilat each Seated green pball rollout 2x10 to encourage shoulder flexion Seated lumbar/thoracic extension 2x10 Seated clamshells with blue tband 2x10 Seated shoulder ER and horizontal abduction with red tband 2x10 (pt with difficulty with LUE during horizontal abduction) Seated hamstring curls with blue tband 2x10 bilat Supine shoulder flexion with cane 2x10 Supine left shoulder abduction AA/ROM with cane 2x10 Supine chest press with 2# on cane 2x10 Nustep level 4 x6 min with PT present to discuss status   Date:  05/21/2022 Nustep level 3 x6 min with PT present to discuss status Seated green pball rollout 2x10 to encourage shoulder flexion Seated with 2#:  marching, LAQ, hip abduction scissors.  2x10 bilat each Seated lumbar/thoracic extension 2x10 Seated hip adduction ball squeeze 2x10 Seated clamshells with red tband 2x10 Seated hamstring curls with red tband 2x10 bilat Seated shoulder ER and horizontal abduction with red tband 2x10 (pt with difficulty with LUE during horizontal abduction) Manual therapy to bilateral cervical paraspinals (with gentle pressure avoiding surgical incision), upper traps, and rhomboids  Date:  05/19/2022 Nustep level 3 x5 min with PT  present to discuss status Cervical rotation and cervical extension 2x10 each Seated scapular retraction 2x10 Seated with 2#:  marching, LAQ, hip abduction  scissors.  2x10 bilat each Seated hip adduction ball squeeze 2x10 3 minute ambulation without assistive device Seated green pball rollout 2x10 to encourage shoulder flexion Sit to/from stand x10    PATIENT EDUCATION:  Education details: Issued HEP Person educated: Patient Education method: Consulting civil engineer, Verbal cues, and Handouts Education comprehension: verbalized understanding and returned demonstration   HOME EXERCISE PROGRAM: Access Code: [email or web address] URL: [email or web address] Date: 05/25/2022 Prepared by: Shelby Dubin Sherlie Boyum  Exercises - Seated Cervical Rotation AROM  - 1 x daily - 7 x weekly - 2 sets - 10 reps - Seated Cervical Extension AROM  - 1 x daily - 7 x weekly - 2 sets - 10 reps - Seated Scapular Retraction  - 1 x daily - 7 x weekly - 2 sets - 10 reps - Seated March  - 1 x daily - 7 x weekly - 2 sets - 10 reps - Seated Long Arc Quad  - 1 x daily - 7 x weekly - 2 sets - 10 reps - Supine Shoulder Flexion with Dowel  - 1 x daily - 7 x weekly - 2 sets - 10 reps - Supine Shoulder Abduction AAROM with Dowel  - 1 x daily - 7 x weekly - 2 sets - 10 reps  ASSESSMENT:  CLINICAL IMPRESSION: Mr Barb presents to skilled PT with reports of compliance with HEP and states that he did 3 minutes on the elliptical yesterday. Pt reports feeling like his arms are weaker than his legs and wants to continue working both.  Pt with some difficulty with left UE during some of the exercises for the upper body and with some pain with left shoulder abduction AA/ROM with cane in supine.  Pt continues to require skilled PT to progress towards goal related activities.   OBJECTIVE IMPAIRMENTS decreased activity tolerance, decreased balance, difficulty walking, decreased ROM, decreased strength, increased muscle spasms, impaired flexibility, impaired UE functional use, postural dysfunction, and pain.   ACTIVITY LIMITATIONS carrying, stairs, transfers, and reach over head  PARTICIPATION  LIMITATIONS: cleaning, driving, and community activity  PERSONAL FACTORS Age and 3+ comorbidities: s/p cervical laminectomy on 04/16/22, polio in childhood, s/p Pacemaker placement, DM, Angina  are also affecting patient's functional outcome.   REHAB POTENTIAL: Good  CLINICAL DECISION MAKING: Evolving/moderate complexity  EVALUATION COMPLEXITY: Moderate   GOALS: Goals reviewed with patient? Yes  SHORT TERM GOALS: Target date: 06/01/2022   Pt will be independent with initial HEP. Baseline:  Goal status: MET  2.  Pt will report at least a 30% improvement in symptoms since starting PT. Baseline:  Goal status: INITIAL   LONG TERM GOALS: Target date: 07/02/2022  Pt will be independent with advanced HEP. Baseline:  Goal status: IN PROGRESS  2.  Pt will increase FOTO to at least 60% to demonstrate improvements in functional mobility. Baseline: 51% Goal status: INITIAL  3.  Pt will increase UE and LE bilateral strength to at least 4+/5 to allow him to more easily perform household tasks. Baseline: see above Goal status: INITIAL  4.  Pt will report ability to ambulate for at least 20 min without any increased pain or loss of balance to allow for community reintegration. Baseline:  Goal status: INITIAL  5.  Pt will increase cervical A/ROM to [facility] to allow him to drive without difficulty or increased pain. Baseline: see above Goal status: INITIAL  PLAN: PT FREQUENCY: 2x/week  PT DURATION: 8 weeks  PLANNED INTERVENTIONS: Therapeutic exercises, Therapeutic activity, Neuromuscular re-education, Balance training, Gait training, Patient/Family education, Self Care, Joint mobilization, Joint manipulation, Stair training, Aquatic Therapy, Dry Needling, Spinal manipulation, Spinal mobilization, Cryotherapy, Moist heat, scar mobilization, Taping, Ultrasound, Ionotophoresis 73m/ml Dexamethasone, Manual therapy, and Re-evaluation  PLAN FOR NEXT SESSION: progress and assess HEP,  strengthening, A/ROM, balance   SJuel Burrow PT 05/25/2022, 2:52 PM   [place] [place] [id number] # [phone] [phone]

## 2022-05-26 IMAGING — CR DG CHEST 2V
2 series · 2 of 2 positions shown · non-contrast
Comparison: 01/18/2020

CLINICAL DATA: Nausea, vomiting

EXAM:
CHEST - 2 VIEW

[w chest lat]
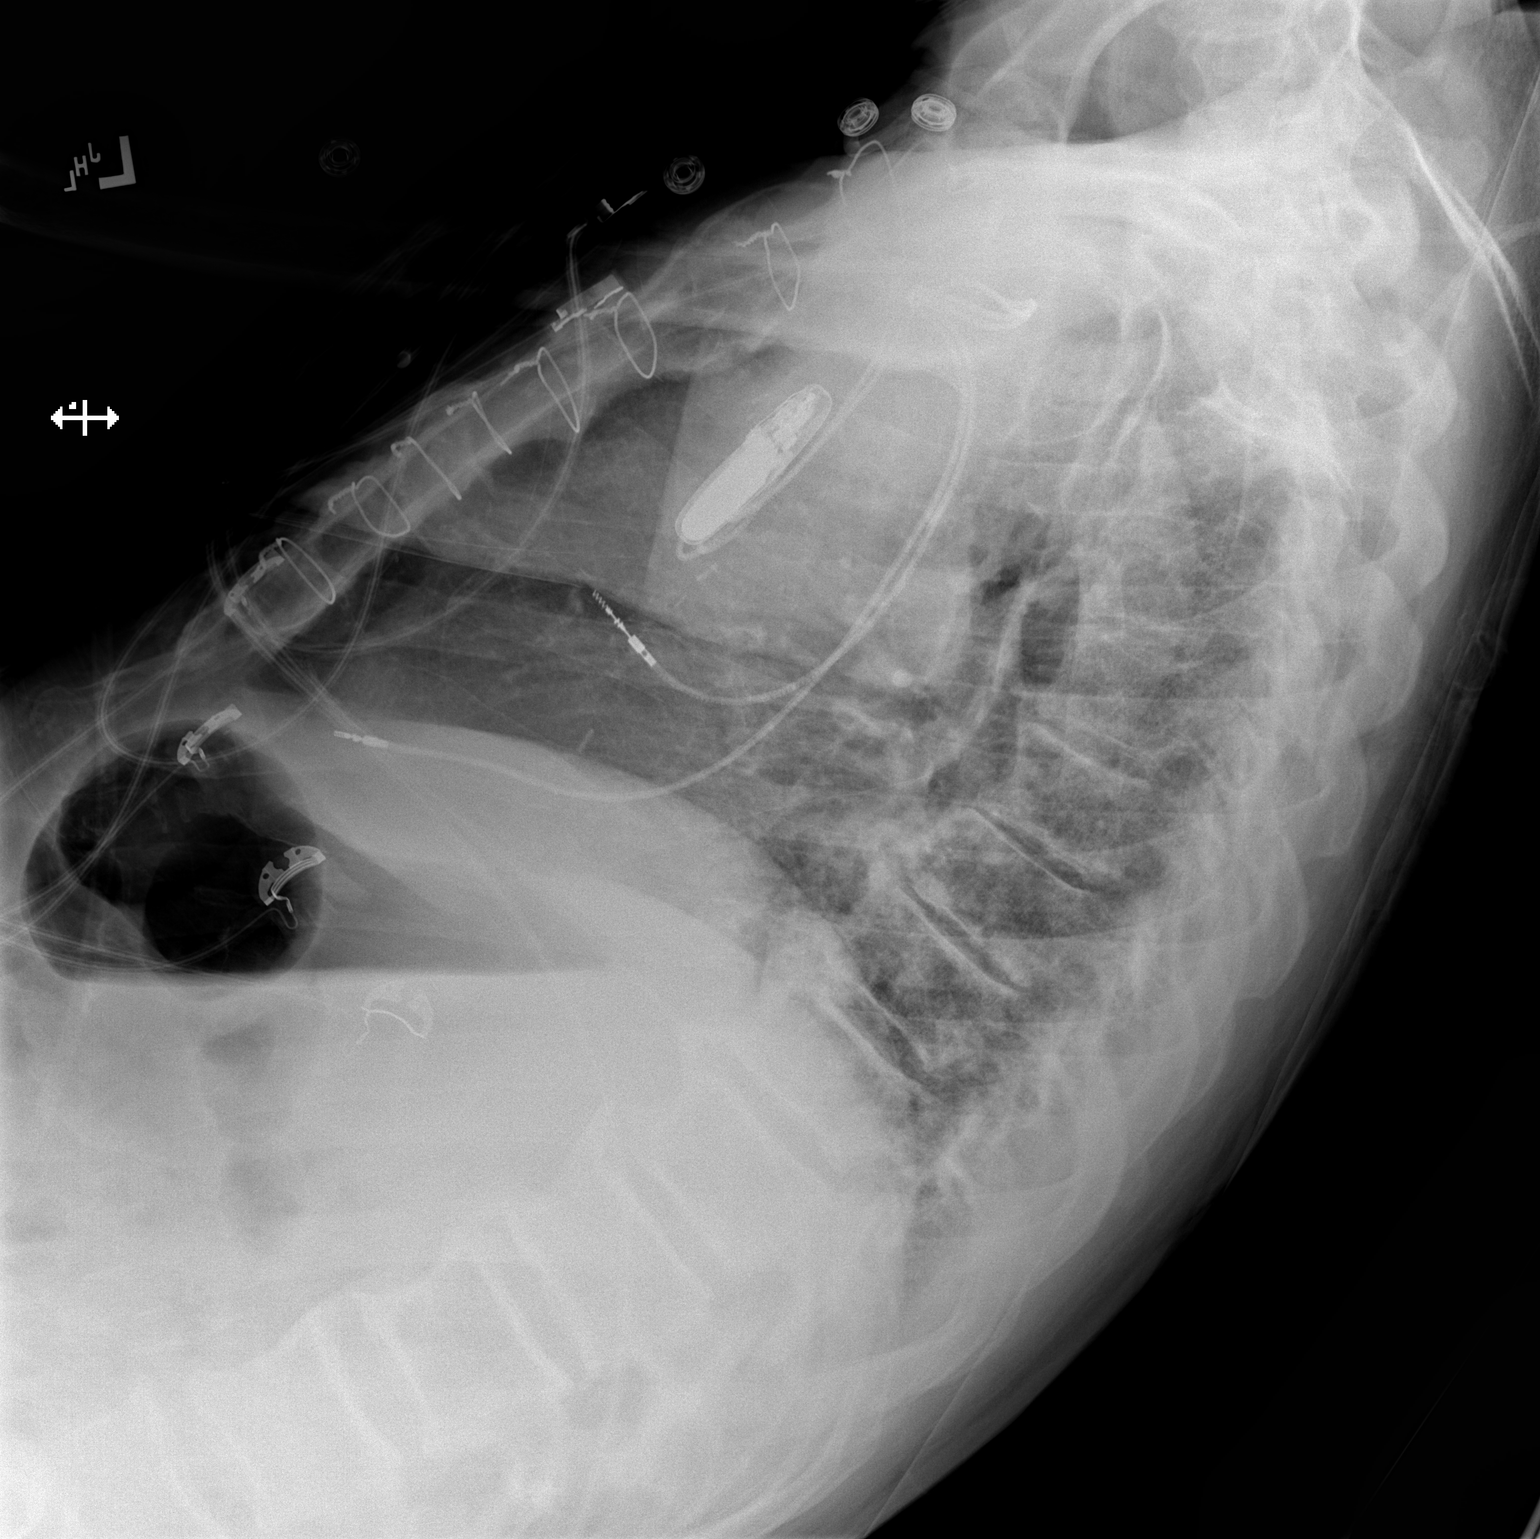

[x chest ap]
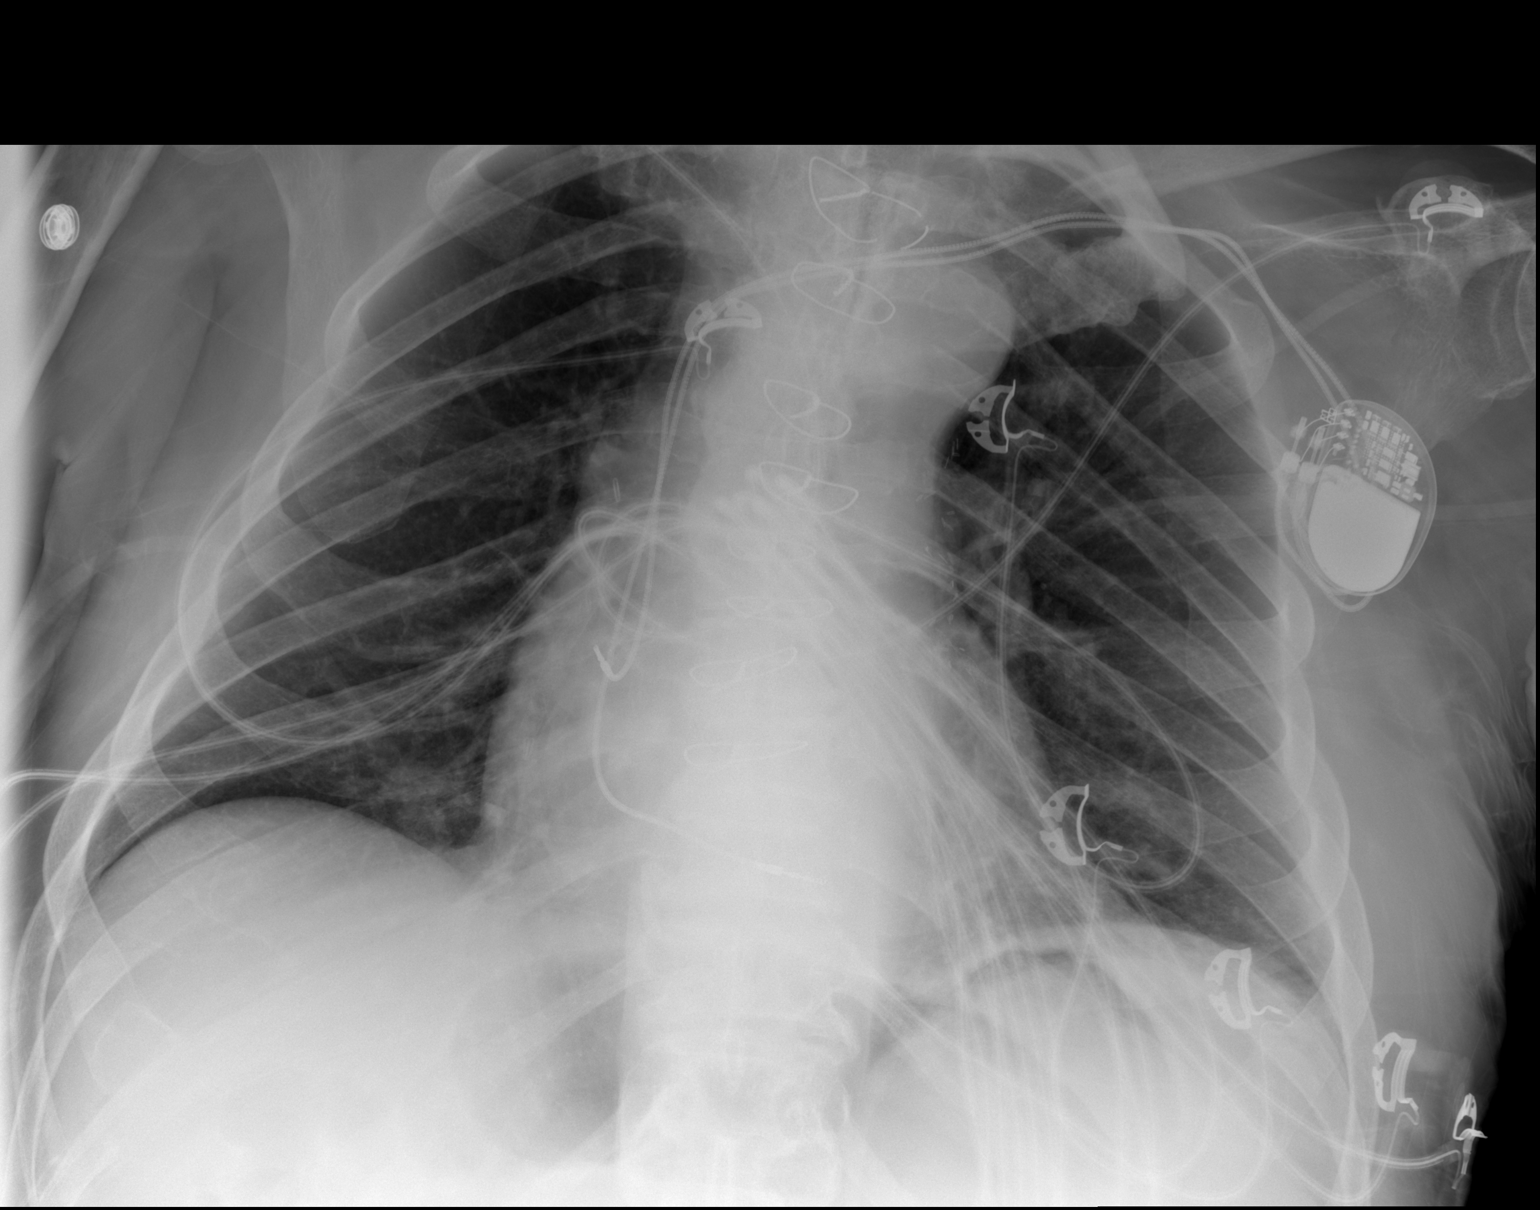

[2 of 2 positions shown; findings below may reference images not displayed]

FINDINGS: Left pacer remains in place, unchanged. Heart is normal size. No
confluent opacities or effusions. No acute bony abnormality.
IMPRESSION: No active cardiopulmonary disease.

## 2022-05-27 ENCOUNTER — Ambulatory Visit: Payer: Medicare HMO | Attending: Neurosurgery | Admitting: Occupational Therapy

## 2022-05-27 ENCOUNTER — Ambulatory Visit: Payer: Medicare HMO | Admitting: Physical Therapy

## 2022-05-27 DIAGNOSIS — M542 Cervicalgia: Secondary | ICD-10-CM | POA: Diagnosis not present

## 2022-05-27 DIAGNOSIS — R208 Other disturbances of skin sensation: Secondary | ICD-10-CM | POA: Diagnosis not present

## 2022-05-27 DIAGNOSIS — R2689 Other abnormalities of gait and mobility: Secondary | ICD-10-CM | POA: Diagnosis not present

## 2022-05-27 DIAGNOSIS — R278 Other lack of coordination: Secondary | ICD-10-CM | POA: Diagnosis not present

## 2022-05-27 DIAGNOSIS — R252 Cramp and spasm: Secondary | ICD-10-CM | POA: Insufficient documentation

## 2022-05-27 DIAGNOSIS — M6281 Muscle weakness (generalized): Secondary | ICD-10-CM | POA: Insufficient documentation

## 2022-05-27 NOTE — Therapy (Signed)
OUTPATIENT OCCUPATIONAL THERAPY   Treatment Note  Patient Name: Jesse [name] MRN: [id number] DOB:Aug 12, 1940, 81 y.o., male Today's Date: 05/27/2022  PCP: Deland Pretty, MD REFERRING PROVIDER: Consuella Lose, MD    OT End of Session - 05/27/22 1200     Visit Number 2    Number of Visits 15    Date for OT Re-Evaluation 07/16/22    Authorization Type [employer]    OT Start Time 1150    OT Stop Time 6962    OT Time Calculation (min) 40 min              Past Medical History:  Diagnosis Date   Angina pectoris ([place]) 02/03/2015   CAD (coronary artery disease), native coronary artery 08/30/2018   2007 [place], [place]: LIMA to LAD, Occluded SVG to RCA and OM by angiogram S/P  PCI/orbital atherectomy S/P 2.5x15 Resoluute to prox Cx on 11/29/2017.   Coronary artery disease    High cholesterol    Hypertension    Myocardial infarction [facility]) 2017/2018?   NSTEMI (non-ST elevated myocardial infarction) ([place]) 02/03/2015   Presence of permanent cardiac pacemaker    Medtronic   Type II diabetes mellitus ([place])    Past Surgical History:  Procedure Laterality Date   CARDIAC CATHETERIZATION N/A 02/04/2015   Procedure: Left Heart Cath and Cors/Grafts Angiography;  Surgeon: Adrian Prows, MD;  Location: [place] [facility];  Service: Cardiovascular;  Laterality: N/A;   CARDIAC CATHETERIZATION N/A 02/04/2015   Procedure: Coronary Stent Intervention;  Surgeon: Adrian Prows, MD;  Location: [place] [facility];  Service: Cardiovascular;  Laterality: N/A;   COLONOSCOPY     several   CORONARY ANGIOGRAPHY N/A 10/18/2017   Procedure: CORONARY ANGIOGRAPHY;  Surgeon: Adrian Prows, MD;  Location: [place] [facility];  Service: Cardiovascular;  Laterality: N/A;   CORONARY ANGIOPLASTY     CORONARY ANGIOPLASTY WITH STENT PLACEMENT     "i've got 5-6 stents total" (08/30/2017)   CORONARY ARTERY BYPASS GRAFT  2007   CABG X3   CORONARY ATHERECTOMY N/A 10/18/2017   Procedure: CORONARY ATHERECTOMY;  Surgeon:  Adrian Prows, MD;  Location: [place] [facility];  Service: Cardiovascular;  Laterality: N/A;   CORONARY ATHERECTOMY N/A 11/29/2017   Procedure: CORONARY ATHERECTOMY;  Surgeon: Adrian Prows, MD;  Location: [place] [facility];  Service: Cardiovascular;  Laterality: N/A;   CORONARY CTO INTERVENTION N/A 08/30/2017   Procedure: CORONARY CTO INTERVENTION;  Surgeon: Adrian Prows, MD;  Location: [place] [facility];  Service: Cardiovascular;  Laterality: N/A;   CORONARY STENT INTERVENTION N/A 11/29/2017   Procedure: CORONARY STENT INTERVENTION;  Surgeon: Adrian Prows, MD;  Location: [place] [facility];  Service: Cardiovascular;  Laterality: N/A;   INSERT / REPLACE / REMOVE PACEMAKER  ?2012   Now currently has Medtronic Pacemaker.  "I think it was placed in 2012"   KNEE ARTHROSCOPY Left 04/03/2020   Procedure: LEFT KNEE ARTHROSCOPY  WASHOUT;  Surgeon: Tania Ade, MD;  Location: [facility];  Service: Orthopedics;  Laterality: Left;   LEFT HEART CATH AND CORONARY ANGIOGRAPHY N/A 08/30/2017   Procedure: LEFT HEART CATH AND CORONARY ANGIOGRAPHY;  Surgeon: Adrian Prows, MD;  Location: [place] [facility];  Service: Cardiovascular;  Laterality: N/A;   LEFT HEART CATH AND CORS/GRAFTS ANGIOGRAPHY N/A 08/05/2017   Procedure: LEFT HEART CATH AND CORS/GRAFTS ANGIOGRAPHY;  Surgeon: Adrian Prows, MD;  Location: [place] [facility];  Service: Cardiovascular;  Laterality: N/A;   LEFT HEART CATH AND CORS/GRAFTS ANGIOGRAPHY N/A 06/30/2021  Procedure: LEFT HEART CATH AND CORS/GRAFTS ANGIOGRAPHY;  Surgeon: Adrian Prows, MD;  Location: [place] [facility];  Service: Cardiovascular;  Laterality: N/A;   POSTERIOR CERVICAL LAMINECTOMY N/A 04/16/2022   Procedure: POSTERIOR CERVICAL LAMINECTOMY, CERVICAL THREE-CERVICAL FOUR;  Surgeon: Consuella Lose, MD;  Location: [place] [facility];  Service: Neurosurgery;  Laterality: N/A;  3C   PPM GENERATOR CHANGEOUT N/A 06/17/2021   Procedure: PPM GENERATOR CHANGEOUT;  Surgeon: Evans Lance, MD;  Location: [place] [facility];  Service: Cardiovascular;  Laterality: N/A;   Patient Active Problem List   Diagnosis Date Noted   Stenosis of cervical spine with myelopathy ([place]) 04/16/2022   Enrolled in clinical trial: OCEANIC-AF (Asundexian - factor XIa inhibitor PO BID vs Apixaban PO BID in patients with A. Fib for stroke prevention. 03/26/2022   PAF (paroxysmal atrial fibrillation) ([place]). CHA2DS2-VASc Score is 5 (A, HTN, DM Vasc Dz). 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 12/15/2018   Flu-like symptoms 09/04/2018   Troponin level elevated 09/02/2018   CAD (coronary artery disease), native coronary artery 08/30/2018   Essential hypertension    Mixed hyperlipidemia    Angina pectoris ([place]) 02/03/2015   Post PTCA 02/03/2015   Diabetes mellitus ([place]) 02/03/2015   Sick sinus syndrome ([place])    Pacemaker Medtronic dual chamber AzureT XT DR MRI SureScan Gen change 06/17/21 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 Since surgery, pt reports that his balance and ambulation has improved some, but he is still having some difficulty walking. Pt reports that he is having weakness in bilat hands and feet. Pt reports numbness and no strength in B hand, stating that he has "itrouble picking stuff up". Pt accompanied by: self  PERTINENT HISTORY: Angina, CAD, Hx of MI in 2016, Pacemaker placement in  2007, s/p cervical laminectomy 04/16/22, polio as a child  PRECAUTIONS: Cervical, Fall, and ICD/Pacemaker - recent cervical laminectomy  WEIGHT BEARING RESTRICTIONS: No  PAIN:  Are you having pain? No  FALLS: Has patient fallen in last 6 months? No   PATIENT GOALS: to have better use of hands  OBJECTIVE:   HAND DOMINANCE: Ambidextrous, learned to use left hand as child as his RUE was effected by polio  FUNCTIONAL OUTCOME MEASURES: Quick Dash: 31.8  UPPER EXTREMITY ROM: Pt reports polio impacted L upper arm more than R upper arm, but R hand impacted significantly more and with no L hand impairments from polio.  Active ROM Right eval Left eval  Shoulder flexion WFL   82  Shoulder abduction    Shoulder adduction 115 78  Shoulder extension    Shoulder internal rotation WFL 90%  Shoulder external rotation WFL 90%  Elbow flexion [facility] WFL  Elbow extension [facility]  Wrist flexion    Wrist extension    Wrist ulnar deviation    Wrist radial deviation    Wrist pronation    Wrist supination    (Blank rows =  not tested)  UPPER EXTREMITY MMT:     MMT Right eval Left eval  Shoulder flexion 3+/5 3-/5   Shoulder abduction    Shoulder adduction    Shoulder extension    Shoulder internal rotation    Shoulder external rotation    Middle trapezius    Lower trapezius    Elbow flexion 4-/5 3/5  Elbow extension 4-/5 3-/5  Wrist flexion    Wrist extension    Wrist ulnar deviation    Wrist radial deviation    Wrist pronation    Wrist supination    (Blank rows = not tested)  HAND FUNCTION: Grip strength: Right: 10 lbs; Left: 45 lbs, Lateral pinch: Right: 2 lbs, Left: 12 lbs, and 3 point pinch: Right: 0 lbs, Left: 8 lbs  COORDINATION: Finger Nose Finger test: moderate dysmetria on R, minimal dysmetria on L (completed half as many on R as L in same time frame) 9 Hole Peg test: Right: 1:29.34 (utilized LUE to pick up and place pegs in to R hand d/t polio and removed pegs by  grasping between 3-4 digit) sec; Left: 1:25.69 sec Box and Blocks:  Right 31blocks, Left 41blocks  SENSATION: Reports numbness and tingling in hands and feet     TODAY'S TREATMENT: 05/27/22 Coordination: 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  Pieces quite small, however pt able to pick up with gross lateral pinch. Pt reports improvement from even a few weeks ago.   Pill box assessment: Completed just one day in 2:52 with no errors.  Pt with increased difficulty with opening each pill box slot. Discussed options for pill box as pt with difficulty opening pill box.  Pt reports that his opens with a push which he is able to complete.  OT educated on using towel when administering medications or working with small pieces to allow for visual contrast as well as textured surface to decrease items from rolling away.  AE education: OT provided recommendations and demonstrations for AE to assist with grasp and opening containers.  Pt reports having a multi-level jar opener and bottle opener already.  Educated on use of "adaptive" when searching online for tools to aid in ADLs/IADLs. Coordination: engaged in isolated finger exercises with abduction/adduction, finger extension, gross grasp, and thumb opposition.  Pt demonstrating impairments on R at baseline s/p polio as well as some new difficulties s/p laminectomy on L.  Engaged in in-hand manipulation and translation with small stones.  OT educated on how to upgrade/downgrade task to meet appropriate challenge.   PATIENT EDUCATION: Education details: Educated on AE, modifications to set up to increase success with administering pills, HEP for coordination Person educated: Patient Education method: Explanation Education comprehension: verbalized understanding  HOME EXERCISE PROGRAM: TBD   GOALS: Goals reviewed with patient? Yes  SHORT TERM GOALS: Target date: 06/18/22  Pt will be independent with HEP for fine  and gross motor coordination and strengthening. Baseline: Goal status: IN PROGRESS  2.  Pt will verbalize understanding of task modifications and/or potential AE needs to increase ease, safety, and independence w/ ADLs. Baseline:  Goal status: IN PROGRESS   LONG TERM GOALS: Target date: 07/16/22  Pt will demonstrate improved L shoulder flexion to retrieve light weight object from cabinet at moderate height with use of BUE as needed. Baseline:  Goal status: IN PROGRESS  2.  Pt will demonstrate improved fine motor coordination for ADLs as evidenced by decreasing 9 hole peg test score  for LUE by 10 sec and RUE by 5 secs Baseline: 9 Hole Peg test: Right: 1:29.34 (utilized LUE to pick up and place pegs in to R hand d/t polio and removed pegs by grasping between 3-4 digit) sec; Left: 1:25.69 sec Goal status: IN PROGRESS  3.  Pt will demonstrate improved ability to open containers and cut food with use of AE as needed. Baseline:  Goal status: IN PROGRESS  4.  Pt will report improved functional use of BUE as demonstrated by improved score on QuickDash to </= to 25 Baseline: 31.8 Goal status: IN PROGRESS   ASSESSMENT:  CLINICAL IMPRESSION: Pt seen for first treatment session s/p initial evaluation.  OT reviewed POC and discussed goals established at evaluation, pt reporting understanding and agreement.  Treatment session with focus on isolated finger activities, coordination, and modifications/adaptive equipment to increase success with ADLs/IADLs.  Pt pleased with recommendation for use of towel when administering pills or working with small items.  PERFORMANCE DEFICITS: in functional skills including ADLs, IADLs, coordination, dexterity, sensation, ROM, strength, pain, flexibility, Fine motor control, Gross motor control, balance, body mechanics, decreased knowledge of use of DME, and UE functional use and psychosocial skills including environmental adaptation.   IMPAIRMENTS: are limiting  patient from ADLs and IADLs.   CO-MORBIDITIES; may have co-morbidities  that affects occupational performance. Patient will benefit from skilled OT to address above impairments and improve overall function.  MODIFICATION OR ASSISTANCE TO COMPLETE EVALUATION: Min-Moderate modification of tasks or assist with assess necessary to complete an evaluation.  OT OCCUPATIONAL PROFILE AND HISTORY: Detailed assessment: Review of records and additional review of physical, cognitive, psychosocial history related to current functional performance.  CLINICAL DECISION MAKING: Moderate - several treatment options, min-mod task modification necessary  REHAB POTENTIAL: Good  EVALUATION COMPLEXITY: Moderate    PLAN:  OT FREQUENCY: 1-2x/week  OT DURATION: 8 weeks  PLANNED INTERVENTIONS: self care/ADL training, therapeutic exercise, therapeutic activity, neuromuscular re-education, manual therapy, passive range of motion, functional mobility training, splinting, moist heat, cryotherapy, patient/family education, psychosocial skills training, energy conservation, coping strategies training, and DME and/or AE instructions  RECOMMENDED OTHER SERVICES: NA  CONSULTED AND AGREED WITH PLAN OF CARE: Patient  PLAN FOR NEXT SESSION: Review and continue to add to coordination and strengthening HEP (both hands and L shoulder).     Anita Mcadory, [place], OTR/L 05/27/2022, 12:00 PM

## 2022-05-27 NOTE — Therapy (Deleted)
OUTPATIENT PHYSICAL THERAPY TREATMENT NOTE   Patient Name: Jesse [name] MRN: [id number] DOB:Dec 24, 1940, 81 y.o., male Today's Date: 05/27/2022     Past Medical History:  Diagnosis Date   Angina pectoris ([place]) 02/03/2015   CAD (coronary artery disease), native coronary artery 08/30/2018   2007 [place], [place]: LIMA to LAD, Occluded SVG to RCA and OM by angiogram S/P  PCI/orbital atherectomy S/P 2.5x15 Resoluute to prox Cx on 11/29/2017.   Coronary artery disease    High cholesterol    Hypertension    Myocardial infarction [facility]) 2017/2018?   NSTEMI (non-ST elevated myocardial infarction) ([place]) 02/03/2015   Presence of permanent cardiac pacemaker    Medtronic   Type II diabetes mellitus ([place])    Past Surgical History:  Procedure Laterality Date   CARDIAC CATHETERIZATION N/A 02/04/2015   Procedure: Left Heart Cath and Cors/Grafts Angiography;  Surgeon: Adrian Prows, MD;  Location: [place] [facility];  Service: Cardiovascular;  Laterality: N/A;   CARDIAC CATHETERIZATION N/A 02/04/2015   Procedure: Coronary Stent Intervention;  Surgeon: Adrian Prows, MD;  Location: [place] [facility];  Service: Cardiovascular;  Laterality: N/A;   COLONOSCOPY     several   CORONARY ANGIOGRAPHY N/A 10/18/2017   Procedure: CORONARY ANGIOGRAPHY;  Surgeon: Adrian Prows, MD;  Location: [place] [facility];  Service: Cardiovascular;  Laterality: N/A;   CORONARY ANGIOPLASTY     CORONARY ANGIOPLASTY WITH STENT PLACEMENT     "i've got 5-6 stents total" (08/30/2017)   CORONARY ARTERY BYPASS GRAFT  2007   CABG X3   CORONARY ATHERECTOMY N/A 10/18/2017   Procedure: CORONARY ATHERECTOMY;  Surgeon: Adrian Prows, MD;  Location: [place] [facility];  Service: Cardiovascular;  Laterality: N/A;   CORONARY ATHERECTOMY N/A 11/29/2017   Procedure: CORONARY ATHERECTOMY;  Surgeon: Adrian Prows, MD;  Location: [place] [facility];  Service: Cardiovascular;  Laterality: N/A;   CORONARY CTO INTERVENTION N/A 08/30/2017   Procedure: CORONARY  CTO INTERVENTION;  Surgeon: Adrian Prows, MD;  Location: [place] [facility];  Service: Cardiovascular;  Laterality: N/A;   CORONARY STENT INTERVENTION N/A 11/29/2017   Procedure: CORONARY STENT INTERVENTION;  Surgeon: Adrian Prows, MD;  Location: [place] [facility];  Service: Cardiovascular;  Laterality: N/A;   INSERT / REPLACE / REMOVE PACEMAKER  ?2012   Now currently has Medtronic Pacemaker.  "I think it was placed in 2012"   KNEE ARTHROSCOPY Left 04/03/2020   Procedure: LEFT KNEE ARTHROSCOPY  WASHOUT;  Surgeon: Tania Ade, MD;  Location: [facility];  Service: Orthopedics;  Laterality: Left;   LEFT HEART CATH AND CORONARY ANGIOGRAPHY N/A 08/30/2017   Procedure: LEFT HEART CATH AND CORONARY ANGIOGRAPHY;  Surgeon: Adrian Prows, MD;  Location: [place] [facility];  Service: Cardiovascular;  Laterality: N/A;   LEFT HEART CATH AND CORS/GRAFTS ANGIOGRAPHY N/A 08/05/2017   Procedure: LEFT HEART CATH AND CORS/GRAFTS ANGIOGRAPHY;  Surgeon: Adrian Prows, MD;  Location: [place] [facility];  Service: Cardiovascular;  Laterality: N/A;   LEFT HEART CATH AND CORS/GRAFTS ANGIOGRAPHY N/A 06/30/2021   Procedure: LEFT HEART CATH AND CORS/GRAFTS ANGIOGRAPHY;  Surgeon: Adrian Prows, MD;  Location: [place] [facility];  Service: Cardiovascular;  Laterality: N/A;   POSTERIOR CERVICAL LAMINECTOMY N/A 04/16/2022   Procedure: POSTERIOR CERVICAL LAMINECTOMY, CERVICAL THREE-CERVICAL FOUR;  Surgeon: Consuella Lose, MD;  Location: [place];  Service: Neurosurgery;  Laterality: N/A;  3C   PPM GENERATOR CHANGEOUT N/A 06/17/2021   Procedure: PPM GENERATOR CHANGEOUT;  Surgeon: Evans Lance, MD;  Location: [facility]  [facility];  Service: Cardiovascular;  Laterality: N/A;   Patient Active Problem List   Diagnosis Date Noted   Stenosis of cervical spine with myelopathy ([place]) 04/16/2022   Enrolled in clinical trial: OCEANIC-AF (Asundexian - factor XIa inhibitor PO BID vs Apixaban PO BID in patients with A. Fib for stroke prevention.  03/26/2022   PAF (paroxysmal atrial fibrillation) ([place]). CHA2DS2-VASc Score is 5 (A, HTN, DM Vasc Dz). 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 12/15/2018   Flu-like symptoms 09/04/2018   Troponin level elevated 09/02/2018   CAD (coronary artery disease), native coronary artery 08/30/2018   Essential hypertension    Mixed hyperlipidemia    Angina pectoris ([place]) 02/03/2015   Post PTCA 02/03/2015   Diabetes mellitus ([place]) 02/03/2015   Sick sinus syndrome [facility])    Pacemaker Medtronic dual chamber AzureT XT DR MRI SureScan Gen change 06/17/21 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  Rationale for Evaluation and Treatment Rehabilitation  ONSET DATE: s/p cervical laminectomy on 04/16/2022  SUBJECTIVE:                                                                                                                                                                                                         SUBJECTIVE STATEMENT: Pt reports that he is doing so/so this morning  PERTINENT HISTORY:  Angina, CAD, Hx of MI in 2016, Pacemaker placement in 2007, s/p cervical laminectomy 04/16/22, polio as a child  PAIN:  Are you having pain? Yes: NPRS scale:  discomfort 3/10 Pain location: bilateral hands Pain description: discomfort numbness Aggravating factors: unknown Relieving factors: unknown  PRECAUTIONS: Cervical, Fall, and ICD/Pacemaker recent cervical laminectomy  WEIGHT BEARING RESTRICTIONS No  FALLS:  Has patient fallen in last 6 months? No  LIVING ENVIRONMENT: Lives with: lives with their  spouse Lives in: House/apartment Stairs: Yes: External: 3 steps; can reach both Has following equipment at home: Gilford Rile - 4 wheeled, shower chair, and Grab bars  OCCUPATION: Retired Theatre manager  PLOF: Independent and Leisure: Museum/gallery curator, model train  PATIENT GOALS:  To walk normally again.  OBJECTIVE:   DIAGNOSTIC FINDINGS:  04/16/22 Cervical Radiographs IMPRESSION: Localization film for C3-C4 posterior  laminectomy. Surgical instruments overlying the soft tissues posterior to the C2 through C4 spinous processes.  PATIENT SURVEYS:  05/11/2022:  FOTO 51% (projected 60% by visit 12)   COGNITION: Overall cognitive status: Within functional limits for tasks assessed   SENSATION: Reports numbness and tingling in hands and feet  POSTURE:  Right shoulder higher than left with side lean noted  PALPATION: Some muscle spasms noted in cervical region   CERVICAL ROM:   Active ROM A/ROM (deg) eval  Flexion 40  Extension 10  Right lateral flexion 15  Left lateral flexion 10  Right rotation 30  Left rotation 19   (Blank rows = not tested)  UPPER EXTREMITY ROM:  Active ROM Right eval Left eval  Shoulder flexion 126 105  Shoulder extension    Shoulder abduction 115 85  Shoulder adduction    Shoulder internal rotation    Shoulder external rotation    Elbow flexion    Elbow extension    Wrist flexion    Wrist extension    Wrist ulnar deviation    Wrist radial deviation    Wrist pronation    Wrist supination     (Blank rows = not tested)  UPPER EXTREMITY AND LOWER EXTREMITY MMT: 05/11/2022:   Right UE strength of 4-/5, Left UE strength of 3/5 Bilateral LE strength of 4/5 grossly throughout  FUNCTIONAL TESTS:  05/11/2022: 5 times sit to stand: 15.3 sec Timed up and go (TUG): 13.4 sec  05/19/2022: 3 minute walk test:  451 ft without assistive device   TODAY'S TREATMENT:  Date:  05/25/2022 Seated with 3#:  marching, LAQ, hip abduction scissors.   2x10 bilat each Seated green pball rollout 2x10 to encourage shoulder flexion Seated lumbar/thoracic extension 2x10 Seated clamshells with blue tband 2x10 Seated shoulder ER and horizontal abduction with red tband 2x10 (pt with difficulty with LUE during horizontal abduction) Seated hamstring curls with blue tband 2x10 bilat Supine shoulder flexion with cane 2x10 Supine left shoulder abduction AA/ROM with cane 2x10 Supine chest press with 2# on cane 2x10 Nustep level 4 x6 min with PT present to discuss status   Date:  05/21/2022 Nustep level 3 x6 min with PT present to discuss status Seated green pball rollout 2x10 to encourage shoulder flexion Seated with 2#:  marching, LAQ, hip abduction scissors.  2x10 bilat each Seated lumbar/thoracic extension 2x10 Seated hip adduction ball squeeze 2x10 Seated clamshells with red tband 2x10 Seated hamstring curls with red tband 2x10 bilat Seated shoulder ER and horizontal abduction with red tband 2x10 (pt with difficulty with LUE during horizontal abduction) Manual therapy to bilateral cervical paraspinals (with gentle pressure avoiding surgical incision), upper traps, and rhomboids  Date:  05/19/2022 Nustep level 3 x5 min with PT present to discuss status Cervical rotation and cervical extension 2x10 each Seated scapular retraction 2x10 Seated with 2#:  marching, LAQ, hip abduction scissors.  2x10 bilat each Seated hip adduction ball squeeze 2x10 3 minute ambulation without assistive device Seated green pball rollout 2x10 to encourage shoulder flexion Sit to/from stand x10    PATIENT EDUCATION:  Education details: Issued HEP Person educated: Patient Education method: Consulting civil engineer, Verbal cues, and Handouts Education comprehension: verbalized understanding and returned demonstration   HOME EXERCISE PROGRAM: Access Code: [email or web address] URL: [email or web address] Date: 05/25/2022 Prepared by: Juel Burrow  Exercises -  Seated Cervical Rotation AROM  - 1 x daily - 7 x weekly - 2 sets - 10 reps - Seated Cervical Extension AROM  - 1 x daily -  7 x weekly - 2 sets - 10 reps - Seated Scapular Retraction  - 1 x daily - 7 x weekly - 2 sets - 10 reps - Seated March  - 1 x daily - 7 x weekly - 2 sets - 10 reps - Seated Long Arc Quad  - 1 x daily - 7 x weekly - 2 sets - 10 reps - Supine Shoulder Flexion with Dowel  - 1 x daily - 7 x weekly - 2 sets - 10 reps - Supine Shoulder Abduction AAROM with Dowel  - 1 x daily - 7 x weekly - 2 sets - 10 reps  ASSESSMENT:  CLINICAL IMPRESSION: Mr Lacko presents to skilled PT with reports of compliance with HEP and states that he did 3 minutes on the elliptical yesterday. Pt reports feeling like his arms are weaker than his legs and wants to continue working both.  Pt with some difficulty with left UE during some of the exercises for the upper body and with some pain with left shoulder abduction AA/ROM with cane in supine.  Pt continues to require skilled PT to progress towards goal related activities.   OBJECTIVE IMPAIRMENTS decreased activity tolerance, decreased balance, difficulty walking, decreased ROM, decreased strength, increased muscle spasms, impaired flexibility, impaired UE functional use, postural dysfunction, and pain.   ACTIVITY LIMITATIONS carrying, stairs, transfers, and reach over head  PARTICIPATION LIMITATIONS: cleaning, driving, and community activity  PERSONAL FACTORS Age and 3+ comorbidities: s/p cervical laminectomy on 04/16/22, polio in childhood, s/p Pacemaker placement, DM, Angina  are also affecting patient's functional outcome.   REHAB POTENTIAL: Good  CLINICAL DECISION MAKING: Evolving/moderate complexity  EVALUATION COMPLEXITY: Moderate   GOALS: Goals reviewed with patient? Yes  SHORT TERM GOALS: Target date: 06/01/2022   Pt will be independent with initial HEP. Baseline:  Goal status: MET  2.  Pt will report at least a 30% improvement in  symptoms since starting PT. Baseline:  Goal status: INITIAL   LONG TERM GOALS: Target date: 07/02/2022  Pt will be independent with advanced HEP. Baseline:  Goal status: IN PROGRESS  2.  Pt will increase FOTO to at least 60% to demonstrate improvements in functional mobility. Baseline: 51% Goal status: INITIAL  3.  Pt will increase UE and LE bilateral strength to at least 4+/5 to allow him to more easily perform household tasks. Baseline: see above Goal status: INITIAL  4.  Pt will report ability to ambulate for at least 20 min without any increased pain or loss of balance to allow for community reintegration. Baseline:  Goal status: INITIAL  5.  Pt will increase cervical A/ROM to [facility] to allow him to drive without difficulty or increased pain. Baseline: see above Goal status: INITIAL    PLAN: PT FREQUENCY: 2x/week  PT DURATION: 8 weeks  PLANNED INTERVENTIONS: Therapeutic exercises, Therapeutic activity, Neuromuscular re-education, Balance training, Gait training, Patient/Family education, Self Care, Joint mobilization, Joint manipulation, Stair training, Aquatic Therapy, Dry Needling, Spinal manipulation, Spinal mobilization, Cryotherapy, Moist heat, scar mobilization, Taping, Ultrasound, Ionotophoresis 49m/ml Dexamethasone, Manual therapy, and Re-evaluation  PLAN FOR NEXT SESSION: progress and assess HEP, strengthening, A/ROM, balance   SLynden Ang PT 05/27/2022, 2:43 PM   [facility] SFruithurstGChelsea Mabscott [address]Phone # [phone] [phone]

## 2022-05-28 ENCOUNTER — Ambulatory Visit: Payer: Medicare HMO | Admitting: Physical Therapy

## 2022-05-28 ENCOUNTER — Encounter: Payer: Self-pay | Admitting: Physical Therapy

## 2022-05-28 DIAGNOSIS — M6281 Muscle weakness (generalized): Secondary | ICD-10-CM | POA: Diagnosis not present

## 2022-05-28 DIAGNOSIS — R2689 Other abnormalities of gait and mobility: Secondary | ICD-10-CM

## 2022-05-28 DIAGNOSIS — M542 Cervicalgia: Secondary | ICD-10-CM

## 2022-05-28 DIAGNOSIS — R278 Other lack of coordination: Secondary | ICD-10-CM | POA: Diagnosis not present

## 2022-05-28 DIAGNOSIS — R252 Cramp and spasm: Secondary | ICD-10-CM

## 2022-05-28 DIAGNOSIS — R208 Other disturbances of skin sensation: Secondary | ICD-10-CM | POA: Diagnosis not present

## 2022-05-28 DIAGNOSIS — Z23 Encounter for immunization: Secondary | ICD-10-CM | POA: Diagnosis not present

## 2022-05-28 NOTE — Therapy (Signed)
OUTPATIENT PHYSICAL THERAPY TREATMENT NOTE   Patient Name: Jesse [name] MRN: [id number] DOB:[birth year], 81 y.o., male Today's Date: 05/28/2022   PT End of Session - 05/28/22 0840     Visit Number 5    Date for PT Re-Evaluation 07/02/22    Authorization Type [employer]    Authorization Time Period 05/11/2022 - 07/02/2022    Authorization - Visit Number 5    Authorization - Number of Visits 16    Progress Note Due on Visit 10    PT Start Time 0845    PT Stop Time 0925    PT Time Calculation (min) 40 min    Activity Tolerance Patient tolerated treatment well    Behavior During Therapy [facility] for tasks assessed/performed              Past Medical History:  Diagnosis Date   Angina pectoris ([place]) 02/03/2015   CAD (coronary artery disease), native coronary artery 08/30/2018   2007 [place], [place]: LIMA to LAD, Occluded SVG to RCA and OM by angiogram S/P  PCI/orbital atherectomy S/P 2.5x15 Resoluute to prox Cx on 11/29/2017.   Coronary artery disease    High cholesterol    Hypertension    Myocardial infarction [facility]) 2017/2018?   NSTEMI (non-ST elevated myocardial infarction) ([place]) 02/03/2015   Presence of permanent cardiac pacemaker    Medtronic   Type II diabetes mellitus ([place])    Past Surgical History:  Procedure Laterality Date   CARDIAC CATHETERIZATION N/A 02/04/2015   Procedure: Left Heart Cath and Cors/Grafts Angiography;  Surgeon: Adrian Prows, MD;  Location: [place] [facility];  Service: Cardiovascular;  Laterality: N/A;   CARDIAC CATHETERIZATION N/A 02/04/2015   Procedure: Coronary Stent Intervention;  Surgeon: Adrian Prows, MD;  Location: [place] [facility];  Service: Cardiovascular;  Laterality: N/A;   COLONOSCOPY     several   CORONARY ANGIOGRAPHY N/A 10/18/2017   Procedure: CORONARY ANGIOGRAPHY;  Surgeon: Adrian Prows, MD;  Location: [place] [facility];  Service: Cardiovascular;  Laterality: N/A;   CORONARY ANGIOPLASTY     CORONARY ANGIOPLASTY WITH STENT PLACEMENT      "i've got 5-6 stents total" (08/30/2017)   CORONARY ARTERY BYPASS GRAFT  2007   CABG X3   CORONARY ATHERECTOMY N/A 10/18/2017   Procedure: CORONARY ATHERECTOMY;  Surgeon: Adrian Prows, MD;  Location: [place] [facility];  Service: Cardiovascular;  Laterality: N/A;   CORONARY ATHERECTOMY N/A 11/29/2017   Procedure: CORONARY ATHERECTOMY;  Surgeon: Adrian Prows, MD;  Location: [place] [facility];  Service: Cardiovascular;  Laterality: N/A;   CORONARY CTO INTERVENTION N/A 08/30/2017   Procedure: CORONARY CTO INTERVENTION;  Surgeon: Adrian Prows, MD;  Location: [place] [facility];  Service: Cardiovascular;  Laterality: N/A;   CORONARY STENT INTERVENTION N/A 11/29/2017   Procedure: CORONARY STENT INTERVENTION;  Surgeon: Adrian Prows, MD;  Location: [place] [facility];  Service: Cardiovascular;  Laterality: N/A;   INSERT / REPLACE / REMOVE PACEMAKER  ?2012   Now currently has Medtronic Pacemaker.  "I think it was placed in 2012"   KNEE ARTHROSCOPY Left 04/03/2020   Procedure: LEFT KNEE ARTHROSCOPY  WASHOUT;  Surgeon: Tania Ade, MD;  Location: [facility];  Service: Orthopedics;  Laterality: Left;   LEFT HEART CATH AND CORONARY ANGIOGRAPHY N/A 08/30/2017   Procedure: LEFT HEART CATH AND CORONARY ANGIOGRAPHY;  Surgeon: Adrian Prows, MD;  Location: [place] [facility];  Service: Cardiovascular;  Laterality: N/A;   LEFT HEART CATH AND CORS/GRAFTS ANGIOGRAPHY N/A 08/05/2017  Procedure: LEFT HEART CATH AND CORS/GRAFTS ANGIOGRAPHY;  Surgeon: Adrian Prows, MD;  Location: [place] [facility];  Service: Cardiovascular;  Laterality: N/A;   LEFT HEART CATH AND CORS/GRAFTS ANGIOGRAPHY N/A 06/30/2021   Procedure: LEFT HEART CATH AND CORS/GRAFTS ANGIOGRAPHY;  Surgeon: Adrian Prows, MD;  Location: [place] [facility];  Service: Cardiovascular;  Laterality: N/A;   POSTERIOR CERVICAL LAMINECTOMY N/A 04/16/2022   Procedure: POSTERIOR CERVICAL LAMINECTOMY, CERVICAL THREE-CERVICAL FOUR;  Surgeon: Consuella Lose, MD;  Location: [place];   Service: Neurosurgery;  Laterality: N/A;  3C   PPM GENERATOR CHANGEOUT N/A 06/17/2021   Procedure: PPM GENERATOR CHANGEOUT;  Surgeon: Evans Lance, MD;  Location: [place] [facility];  Service: Cardiovascular;  Laterality: N/A;   Patient Active Problem List   Diagnosis Date Noted   Stenosis of cervical spine with myelopathy ([place]) 04/16/2022   Enrolled in clinical trial: OCEANIC-AF (Asundexian - factor XIa inhibitor PO BID vs Apixaban PO BID in patients with A. Fib for stroke prevention. 03/26/2022   PAF (paroxysmal atrial fibrillation) ([place]). CHA2DS2-VASc Score is 5 (A, HTN, DM Vasc Dz). 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 12/15/2018   Flu-like symptoms 09/04/2018   Troponin level elevated 09/02/2018   CAD (coronary artery disease), native coronary artery 08/30/2018   Essential hypertension    Mixed hyperlipidemia    Angina pectoris ([place]) 02/03/2015   Post PTCA 02/03/2015   Diabetes mellitus ([place]) 02/03/2015   Sick sinus syndrome [facility])    Pacemaker Medtronic dual chamber AzureT XT DR MRI SureScan Gen change 06/17/21 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    PCP: Deland Pretty, MD  REFERRING PROVIDER: Consuella Lose, MD   REFERRING DIAG: [phone] (ICD-10-CM) - Spinal stenosis, cervical region   THERAPY DIAG:  Cervicalgia  Muscle weakness (generalized)  Cramp and spasm  Other abnormalities of gait and mobility  Rationale for Evaluation and Treatment Rehabilitation  ONSET DATE: s/p cervical laminectomy on 04/16/2022  SUBJECTIVE:                                                                                                                                                                                                         SUBJECTIVE STATEMENT: Pt states that  he feels weak in his legs this morning.   PERTINENT HISTORY:  Angina, CAD, Hx of MI in 2016, Pacemaker placement in 2007,  s/p cervical laminectomy 04/16/22, polio as a child  PAIN:  Are you having pain? Yes: NPRS scale:  discomfort 3/10 Pain location: bilateral hands Pain description: discomfort numbness Aggravating factors: unknown Relieving factors: unknown  PRECAUTIONS: Cervical, Fall, and ICD/Pacemaker recent cervical laminectomy  WEIGHT BEARING RESTRICTIONS No  FALLS:  Has patient fallen in last 6 months? No  LIVING ENVIRONMENT: Lives with: lives with their spouse Lives in: House/apartment Stairs: Yes: External: 3 steps; can reach both Has following equipment at home: Gilford Rile - 4 wheeled, shower chair, and Grab bars  OCCUPATION: Retired Theatre manager  PLOF: Independent and Leisure: Museum/gallery curator, model train  PATIENT GOALS:  To walk normally again.  OBJECTIVE:   DIAGNOSTIC FINDINGS:  04/16/22 Cervical Radiographs IMPRESSION: Localization film for C3-C4 posterior laminectomy. Surgical instruments overlying the soft tissues posterior to the C2 through C4 spinous processes.  PATIENT SURVEYS:  05/11/2022:  FOTO 51% (projected 60% by visit 12)   COGNITION: Overall cognitive status: Within functional limits for tasks assessed   SENSATION: Reports numbness and tingling in hands and feet  POSTURE:  Right shoulder higher than left with side lean noted  PALPATION: Some muscle spasms noted in cervical region   CERVICAL ROM:   Active ROM A/ROM (deg) eval  Flexion 40  Extension 10  Right lateral flexion 15  Left lateral flexion 10  Right rotation 30  Left rotation 19   (Blank rows = not tested)  UPPER EXTREMITY ROM:  Active ROM Right eval Left eval  Shoulder flexion 126 105  Shoulder extension    Shoulder abduction 115 85  Shoulder adduction    Shoulder internal rotation    Shoulder external rotation    Elbow flexion    Elbow extension    Wrist  flexion    Wrist extension    Wrist ulnar deviation    Wrist radial deviation    Wrist pronation    Wrist supination     (Blank rows = not tested)  UPPER EXTREMITY AND LOWER EXTREMITY MMT: 05/11/2022:   Right UE strength of 4-/5, Left UE strength of 3/5 Bilateral LE strength of 4/5 grossly throughout  FUNCTIONAL TESTS:  05/11/2022: 5 times sit to stand: 15.3 sec Timed up and go (TUG): 13.4 sec  05/19/2022: 3 minute walk test:  451 ft without assistive device   TODAY'S TREATMENT:  05/28/2022 Nustep level 5 x6 min with PT present to discuss status Seated with 3#:  marching, LAQ, hip abduction scissors.  2x10 bilat each Seated green pball rollout 2x10 to encourage shoulder flexion Shoulder rolls in posterior direction only x20  Seated lumbar/thoracic extension 2x10 Seated clamshells with blue tband 2x10  Seated hamstring curls with blue tband 2x10 bilat Supine shoulder flexion with cane 2x10 Supine left shoulder abduction AA/ROM with cane 2x10 Supine SLR 2x10, bilat  Supine chest press with 2# on cane 2x10   Date:  05/25/2022 Seated with 3#:  marching, LAQ, hip abduction scissors.  2x10 bilat each Seated green pball rollout 2x10 to encourage shoulder flexion Seated lumbar/thoracic extension 2x10 Seated clamshells with blue tband 2x10 Seated shoulder ER and horizontal abduction with red tband 2x10 (pt with difficulty with LUE during horizontal abduction) Seated hamstring curls with blue tband 2x10 bilat Supine shoulder flexion with cane 2x10 Supine left shoulder abduction AA/ROM with cane 2x10 Supine chest press with 2# on cane 2x10 Nustep level 4 x6 min with PT present to discuss status  Date:  05/21/2022 Nustep level 3 x6 min with PT present to  discuss status Seated green pball rollout 2x10 to encourage shoulder flexion Seated with 2#:  marching, LAQ, hip abduction scissors.  2x10 bilat each Seated lumbar/thoracic extension 2x10 Seated hip adduction ball squeeze  2x10 Seated clamshells with red tband 2x10 Seated hamstring curls with red tband 2x10 bilat Seated shoulder ER and horizontal abduction with red tband 2x10 (pt with difficulty with LUE during horizontal abduction) Manual therapy to bilateral cervical paraspinals (with gentle pressure avoiding surgical incision), upper traps, and rhomboids  Date:  05/19/2022 Nustep level 3 x5 min with PT present to discuss status Cervical rotation and cervical extension 2x10 each Seated scapular retraction 2x10 Seated with 2#:  marching, LAQ, hip abduction scissors.  2x10 bilat each Seated hip adduction ball squeeze 2x10 3 minute ambulation without assistive device Seated green pball rollout 2x10 to encourage shoulder flexion Sit to/from stand x10    PATIENT EDUCATION:  Education details: Issued HEP Person educated: Patient Education method: Consulting civil engineer, Verbal cues, and Handouts Education comprehension: verbalized understanding and returned demonstration   HOME EXERCISE PROGRAM: Access Code: [email or web address] URL: [email or web address] Date: 05/25/2022 Prepared by: Shelby Dubin Menke  Exercises - Seated Cervical Rotation AROM  - 1 x daily - 7 x weekly - 2 sets - 10 reps - Seated Cervical Extension AROM  - 1 x daily - 7 x weekly - 2 sets - 10 reps - Seated Scapular Retraction  - 1 x daily - 7 x weekly - 2 sets - 10 reps - Seated March  - 1 x daily - 7 x weekly - 2 sets - 10 reps - Seated Long Arc Quad  - 1 x daily - 7 x weekly - 2 sets - 10 reps - Supine Shoulder Flexion with Dowel  - 1 x daily - 7 x weekly - 2 sets - 10 reps - Supine Shoulder Abduction AAROM with Dowel  - 1 x daily - 7 x weekly - 2 sets - 10 reps  ASSESSMENT:  CLINICAL IMPRESSION: Mr Downs presents to skilled PT with reports of compliance with HEP and states that he did a lot of walking yesterday. He woke up feeling generally weak and just got the flu shot this morning. Pt demonstrates good forum with prescribed exercises  today. Held off on horizontal abduction per pt request. He demonstrates general fatigue today. Pt continues to require skilled PT to progress towards goal related activities.   OBJECTIVE IMPAIRMENTS decreased activity tolerance, decreased balance, difficulty walking, decreased ROM, decreased strength, increased muscle spasms, impaired flexibility, impaired UE functional use, postural dysfunction, and pain.   ACTIVITY LIMITATIONS carrying, stairs, transfers, and reach over head  PARTICIPATION LIMITATIONS: cleaning, driving, and community activity  PERSONAL FACTORS Age and 3+ comorbidities: s/p cervical laminectomy on 04/16/22, polio in childhood, s/p Pacemaker placement, DM, Angina  are also affecting patient's functional outcome.   REHAB POTENTIAL: Good  CLINICAL DECISION MAKING: Evolving/moderate complexity  EVALUATION COMPLEXITY: Moderate   GOALS: Goals reviewed with patient? Yes  SHORT TERM GOALS: Target date: 06/01/2022   Pt will be independent with initial HEP. Baseline:  Goal status: MET  2.  Pt will report at least a 30% improvement in symptoms since starting PT. Baseline:  Goal status: INITIAL   LONG TERM GOALS: Target date: 07/02/2022  Pt will be independent with advanced HEP. Baseline:  Goal status: IN PROGRESS  2.  Pt will increase FOTO to at least 60% to demonstrate improvements in functional mobility. Baseline: 51% Goal status: INITIAL  3.  Pt will increase UE and LE bilateral  strength to at least 4+/5 to allow him to more easily perform household tasks. Baseline: see above Goal status: INITIAL  4.  Pt will report ability to ambulate for at least 20 min without any increased pain or loss of balance to allow for community reintegration. Baseline:  Goal status: INITIAL  5.  Pt will increase cervical A/ROM to [facility] to allow him to drive without difficulty or increased pain. Baseline: see above Goal status: INITIAL    PLAN: PT FREQUENCY: 2x/week  PT  DURATION: 8 weeks  PLANNED INTERVENTIONS: Therapeutic exercises, Therapeutic activity, Neuromuscular re-education, Balance training, Gait training, Patient/Family education, Self Care, Joint mobilization, Joint manipulation, Stair training, Aquatic Therapy, Dry Needling, Spinal manipulation, Spinal mobilization, Cryotherapy, Moist heat, scar mobilization, Taping, Ultrasound, Ionotophoresis 71m/ml Dexamethasone, Manual therapy, and Re-evaluation  PLAN FOR NEXT SESSION: progress and assess HEP, strengthening, A/ROM, balance   SLynden Ang PT 05/28/2022, 8:42 AM   [facility] SBroadwaterGBlue River Byram Center [address]Phone # [phone] [phone]

## 2022-06-01 ENCOUNTER — Ambulatory Visit: Payer: Medicare HMO | Admitting: Rehabilitative and Restorative Service Providers"

## 2022-06-01 ENCOUNTER — Ambulatory Visit: Payer: Medicare HMO | Admitting: Occupational Therapy

## 2022-06-01 ENCOUNTER — Encounter: Payer: Self-pay | Admitting: Rehabilitative and Restorative Service Providers"

## 2022-06-01 DIAGNOSIS — R252 Cramp and spasm: Secondary | ICD-10-CM | POA: Diagnosis not present

## 2022-06-01 DIAGNOSIS — R208 Other disturbances of skin sensation: Secondary | ICD-10-CM

## 2022-06-01 DIAGNOSIS — M542 Cervicalgia: Secondary | ICD-10-CM | POA: Diagnosis not present

## 2022-06-01 DIAGNOSIS — R278 Other lack of coordination: Secondary | ICD-10-CM

## 2022-06-01 DIAGNOSIS — R2689 Other abnormalities of gait and mobility: Secondary | ICD-10-CM | POA: Diagnosis not present

## 2022-06-01 DIAGNOSIS — M6281 Muscle weakness (generalized): Secondary | ICD-10-CM

## 2022-06-01 NOTE — Therapy (Signed)
OUTPATIENT PHYSICAL THERAPY TREATMENT NOTE   Patient Name: Jesse [name] MRN: [id number] DOB:1941-02-26, 81 y.o., male Today's Date: 06/01/2022   PT End of Session - 06/01/22 0935     Visit Number 6    Date for PT Re-Evaluation 07/02/22    Authorization Type [employer]    Authorization Time Period 05/11/2022 - 07/02/2022    Authorization - Visit Number 6    Authorization - Number of Visits 16    Progress Note Due on Visit 10    PT Start Time 0932    PT Stop Time 1010    PT Time Calculation (min) 38 min    Activity Tolerance Patient tolerated treatment well    Behavior During Therapy [facility] for tasks assessed/performed              Past Medical History:  Diagnosis Date   Angina pectoris ([place]) 02/03/2015   CAD (coronary artery disease), native coronary artery 08/30/2018   2007 [place], [place]: LIMA to LAD, Occluded SVG to RCA and OM by angiogram S/P  PCI/orbital atherectomy S/P 2.5x15 Resoluute to prox Cx on 11/29/2017.   Coronary artery disease    High cholesterol    Hypertension    Myocardial infarction [facility]) 2017/2018?   NSTEMI (non-ST elevated myocardial infarction) ([place]) 02/03/2015   Presence of permanent cardiac pacemaker    Medtronic   Type II diabetes mellitus ([place])    Past Surgical History:  Procedure Laterality Date   CARDIAC CATHETERIZATION N/A 02/04/2015   Procedure: Left Heart Cath and Cors/Grafts Angiography;  Surgeon: Adrian Prows, MD;  Location: [place] [facility];  Service: Cardiovascular;  Laterality: N/A;   CARDIAC CATHETERIZATION N/A 02/04/2015   Procedure: Coronary Stent Intervention;  Surgeon: Adrian Prows, MD;  Location: [place] [facility];  Service: Cardiovascular;  Laterality: N/A;   COLONOSCOPY     several   CORONARY ANGIOGRAPHY N/A 10/18/2017   Procedure: CORONARY ANGIOGRAPHY;  Surgeon: Adrian Prows, MD;  Location: [place] [facility];  Service: Cardiovascular;  Laterality: N/A;   CORONARY ANGIOPLASTY     CORONARY ANGIOPLASTY WITH STENT PLACEMENT      "i've got 5-6 stents total" (08/30/2017)   CORONARY ARTERY BYPASS GRAFT  2007   CABG X3   CORONARY ATHERECTOMY N/A 10/18/2017   Procedure: CORONARY ATHERECTOMY;  Surgeon: Adrian Prows, MD;  Location: [place] [facility];  Service: Cardiovascular;  Laterality: N/A;   CORONARY ATHERECTOMY N/A 11/29/2017   Procedure: CORONARY ATHERECTOMY;  Surgeon: Adrian Prows, MD;  Location: [place] [facility];  Service: Cardiovascular;  Laterality: N/A;   CORONARY CTO INTERVENTION N/A 08/30/2017   Procedure: CORONARY CTO INTERVENTION;  Surgeon: Adrian Prows, MD;  Location: [place] [facility];  Service: Cardiovascular;  Laterality: N/A;   CORONARY STENT INTERVENTION N/A 11/29/2017   Procedure: CORONARY STENT INTERVENTION;  Surgeon: Adrian Prows, MD;  Location: [place] [facility];  Service: Cardiovascular;  Laterality: N/A;   INSERT / REPLACE / REMOVE PACEMAKER  ?2012   Now currently has Medtronic Pacemaker.  "I think it was placed in 2012"   KNEE ARTHROSCOPY Left 04/03/2020   Procedure: LEFT KNEE ARTHROSCOPY  WASHOUT;  Surgeon: Tania Ade, MD;  Location: [facility];  Service: Orthopedics;  Laterality: Left;   LEFT HEART CATH AND CORONARY ANGIOGRAPHY N/A 08/30/2017   Procedure: LEFT HEART CATH AND CORONARY ANGIOGRAPHY;  Surgeon: Adrian Prows, MD;  Location: [place] [facility];  Service: Cardiovascular;  Laterality: N/A;   LEFT HEART CATH AND CORS/GRAFTS ANGIOGRAPHY N/A 08/05/2017  Procedure: LEFT HEART CATH AND CORS/GRAFTS ANGIOGRAPHY;  Surgeon: Adrian Prows, MD;  Location: [place] [facility];  Service: Cardiovascular;  Laterality: N/A;   LEFT HEART CATH AND CORS/GRAFTS ANGIOGRAPHY N/A 06/30/2021   Procedure: LEFT HEART CATH AND CORS/GRAFTS ANGIOGRAPHY;  Surgeon: Adrian Prows, MD;  Location: [place] [facility];  Service: Cardiovascular;  Laterality: N/A;   POSTERIOR CERVICAL LAMINECTOMY N/A 04/16/2022   Procedure: POSTERIOR CERVICAL LAMINECTOMY, CERVICAL THREE-CERVICAL FOUR;  Surgeon: Consuella Lose, MD;  Location: [place];   Service: Neurosurgery;  Laterality: N/A;  3C   PPM GENERATOR CHANGEOUT N/A 06/17/2021   Procedure: PPM GENERATOR CHANGEOUT;  Surgeon: Evans Lance, MD;  Location: [place] [facility];  Service: Cardiovascular;  Laterality: N/A;   Patient Active Problem List   Diagnosis Date Noted   Stenosis of cervical spine with myelopathy ([place]) 04/16/2022   Enrolled in clinical trial: OCEANIC-AF (Asundexian - factor XIa inhibitor PO BID vs Apixaban PO BID in patients with A. Fib for stroke prevention. 03/26/2022   PAF (paroxysmal atrial fibrillation) ([place]). CHA2DS2-VASc Score is 5 (A, HTN, DM Vasc Dz). 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 12/15/2018   Flu-like symptoms 09/04/2018   Troponin level elevated 09/02/2018   CAD (coronary artery disease), native coronary artery 08/30/2018   Essential hypertension    Mixed hyperlipidemia    Angina pectoris ([place]) 02/03/2015   Post PTCA 02/03/2015   Diabetes mellitus ([place]) 02/03/2015   Sick sinus syndrome [facility])    Pacemaker Medtronic dual chamber AzureT XT DR MRI SureScan Gen change 06/17/21 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    PCP: Deland Pretty, MD  REFERRING PROVIDER: Consuella Lose, MD   REFERRING DIAG: [phone] (ICD-10-CM) - Spinal stenosis, cervical region   THERAPY DIAG:  Cervicalgia  Muscle weakness (generalized)  Cramp and spasm  Other abnormalities of gait and mobility  Rationale for Evaluation and Treatment Rehabilitation  ONSET DATE: s/p cervical laminectomy on 04/16/2022  SUBJECTIVE:                                                                                                                                                                                                         SUBJECTIVE STATEMENT: Pt reports no  new complaints, states that he is at least 10% better since initial evaluation.  PERTINENT HISTORY:  Angina, CAD, Hx of MI in 2016,  Pacemaker placement in 2007, s/p cervical laminectomy 04/16/22, polio as a child  PAIN:  Are you having pain? Yes: NPRS scale: 1/10 Pain location: bilateral hands Pain description: discomfort numbness Aggravating factors: unknown Relieving factors: unknown  PRECAUTIONS: Cervical, Fall, and ICD/Pacemaker recent cervical laminectomy  WEIGHT BEARING RESTRICTIONS No  FALLS:  Has patient fallen in last 6 months? No  LIVING ENVIRONMENT: Lives with: lives with their spouse Lives in: House/apartment Stairs: Yes: External: 3 steps; can reach both Has following equipment at home: Gilford Rile - 4 wheeled, shower chair, and Grab bars  OCCUPATION: Retired Theatre manager  PLOF: Independent and Leisure: Museum/gallery curator, model train  PATIENT GOALS:  To walk normally again.  OBJECTIVE:   DIAGNOSTIC FINDINGS:  04/16/22 Cervical Radiographs IMPRESSION: Localization film for C3-C4 posterior laminectomy. Surgical instruments overlying the soft tissues posterior to the C2 through C4 spinous processes.  PATIENT SURVEYS:  05/11/2022:  FOTO 51% (projected 60% by visit 12)   COGNITION: Overall cognitive status: Within functional limits for tasks assessed   SENSATION: Reports numbness and tingling in hands and feet  POSTURE:  Right shoulder higher than left with side lean noted  PALPATION: Some muscle spasms noted in cervical region   CERVICAL ROM:   Active ROM A/ROM (deg) eval  Flexion 40  Extension 10  Right lateral flexion 15  Left lateral flexion 10  Right rotation 30  Left rotation 19   (Blank rows = not tested)  UPPER EXTREMITY ROM:  Active ROM Right eval Left eval  Shoulder flexion 126 105  Shoulder extension    Shoulder abduction 115 85  Shoulder adduction    Shoulder internal rotation    Shoulder external rotation    Elbow flexion     Elbow extension    Wrist flexion    Wrist extension    Wrist ulnar deviation    Wrist radial deviation    Wrist pronation    Wrist supination     (Blank rows = not tested)  UPPER EXTREMITY AND LOWER EXTREMITY MMT: 05/11/2022:   Right UE strength of 4-/5, Left UE strength of 3/5 Bilateral LE strength of 4/5 grossly throughout  FUNCTIONAL TESTS:  05/11/2022: 5 times sit to stand: 15.3 sec Timed up and go (TUG): 13.4 sec  05/19/2022: 3 minute walk test:  451 ft without assistive device   TODAY'S TREATMENT:  Date:  06/01/2022 Nustep level 5 x6 min with PT present to discuss status Seated with 4#:  heel/toe raises, marching, LAQ, hip abduction scissors.  2x10 bilat each Seated clamshells with blue tband 2x10 Seated hamstring curls with blue tband 2x10 bilat Seated green pball rollout 2x10 to encourage shoulder flexion Seated lumbar/thoracic extension 2x10 Supine shoulder flexion with cane 2x10 Supine left shoulder abduction AA/ROM with cane 2x10 Supine SLR 2x10, bilat  Supine chest press with 2# on cane 2x10 Supine chin tuck 2x10 Sit to/from stand (elevated on purple foam) holding 4# dumbbell x10   05/28/2022 Nustep level 5 x6 min with PT present to discuss status Seated with 3#:  marching, LAQ, hip abduction scissors.  2x10 bilat each Seated green pball rollout 2x10 to encourage shoulder flexion Shoulder rolls in posterior direction only x20  Seated lumbar/thoracic extension 2x10 Seated clamshells with blue tband 2x10  Seated hamstring curls with blue tband 2x10 bilat Supine shoulder flexion with cane 2x10 Supine left shoulder abduction AA/ROM with cane 2x10 Supine SLR 2x10, bilat  Supine chest press with 2# on cane 2x10   Date:  05/25/2022 Seated with  3#:  marching, LAQ, hip abduction scissors.  2x10 bilat each Seated green pball rollout 2x10 to encourage shoulder flexion Seated lumbar/thoracic extension 2x10 Seated clamshells with blue tband 2x10 Seated  shoulder ER and horizontal abduction with red tband 2x10 (pt with difficulty with LUE during horizontal abduction) Seated hamstring curls with blue tband 2x10 bilat Supine shoulder flexion with cane 2x10 Supine left shoulder abduction AA/ROM with cane 2x10 Supine chest press with 2# on cane 2x10 Nustep level 4 x6 min with PT present to discuss status    PATIENT EDUCATION:  Education details: Issued HEP Person educated: Patient Education method: Consulting civil engineer, Verbal cues, and Handouts Education comprehension: verbalized understanding and returned demonstration   HOME EXERCISE PROGRAM: Access Code: [email or web address] URL: [email or web address] Date: 05/25/2022 Prepared by: Shelby Dubin Haidan Nhan  Exercises - Seated Cervical Rotation AROM  - 1 x daily - 7 x weekly - 2 sets - 10 reps - Seated Cervical Extension AROM  - 1 x daily - 7 x weekly - 2 sets - 10 reps - Seated Scapular Retraction  - 1 x daily - 7 x weekly - 2 sets - 10 reps - Seated March  - 1 x daily - 7 x weekly - 2 sets - 10 reps - Seated Long Arc Quad  - 1 x daily - 7 x weekly - 2 sets - 10 reps - Supine Shoulder Flexion with Dowel  - 1 x daily - 7 x weekly - 2 sets - 10 reps - Supine Shoulder Abduction AAROM with Dowel  - 1 x daily - 7 x weekly - 2 sets - 10 reps  ASSESSMENT:  CLINICAL IMPRESSION: Mr Dutko presents to skilled PT with reports of feeling at least 10% better since starting PT.  Patient able to progress with increased weight on seated LE exercises.  Pt with some fatigue throughout, requiring brief seated recovery periods.  Added supine cervical retraction and pt required max cuing for technique and pacing.  Pt continues to require skilled PT to progress towards goal related activities.   OBJECTIVE IMPAIRMENTS decreased activity tolerance, decreased balance, difficulty walking, decreased ROM, decreased strength, increased muscle spasms, impaired flexibility, impaired UE functional use, postural dysfunction, and  pain.   ACTIVITY LIMITATIONS carrying, stairs, transfers, and reach over head  PARTICIPATION LIMITATIONS: cleaning, driving, and community activity  PERSONAL FACTORS Age and 3+ comorbidities: s/p cervical laminectomy on 04/16/22, polio in childhood, s/p Pacemaker placement, DM, Angina  are also affecting patient's functional outcome.   REHAB POTENTIAL: Good  CLINICAL DECISION MAKING: Evolving/moderate complexity  EVALUATION COMPLEXITY: Moderate   GOALS: Goals reviewed with patient? Yes  SHORT TERM GOALS: Target date: 06/01/2022   Pt will be independent with initial HEP. Baseline:  Goal status: MET  2.  Pt will report at least a 30% improvement in symptoms since starting PT. Baseline: Reports 10% improvement on 06/01/22 Goal status: IN PROGRESS   LONG TERM GOALS: Target date: 07/02/2022  Pt will be independent with advanced HEP. Baseline:  Goal status: IN PROGRESS  2.  Pt will increase FOTO to at least 60% to demonstrate improvements in functional mobility. Baseline: 51% Goal status: INITIAL  3.  Pt will increase UE and LE bilateral strength to at least 4+/5 to allow him to more easily perform household tasks. Baseline: see above Goal status: INITIAL  4.  Pt will report ability to ambulate for at least 20 min without any increased pain or loss of balance to allow for community reintegration. Baseline:  Goal status: IN PROGRESS  5.  Pt will increase cervical A/ROM to [facility] to allow him to drive without difficulty or increased pain. Baseline: see above Goal status: INITIAL    PLAN: PT FREQUENCY: 2x/week  PT DURATION: 8 weeks  PLANNED INTERVENTIONS: Therapeutic exercises, Therapeutic activity, Neuromuscular re-education, Balance training, Gait training, Patient/Family education, Self Care, Joint mobilization, Joint manipulation, Stair training, Aquatic Therapy, Dry Needling, Spinal manipulation, Spinal mobilization, Cryotherapy, Moist heat, scar mobilization, Taping,  Ultrasound, Ionotophoresis 50m/ml Dexamethasone, Manual therapy, and Re-evaluation  PLAN FOR NEXT SESSION: progress and assess HEP, strengthening, A/ROM, balance   SJuel Burrow PT 06/01/2022, 10:31 AM   [facility] [place] [place] [address]Phone # [phone] [phone]

## 2022-06-01 NOTE — Therapy (Signed)
OUTPATIENT OCCUPATIONAL THERAPY   Treatment Note  Patient Name: Jesse [name] MRN: [id number] DOB:12-09-1940, 81 y.o., male Today's Date: 06/01/2022  PCP: Deland Pretty, MD REFERRING PROVIDER: Consuella Lose, MD    OT End of Session - 06/01/22 1018     Visit Number 3    Number of Visits 15    Date for OT Re-Evaluation 07/16/22    Authorization Type [employer]    OT Start Time 1018    OT Stop Time 1100    OT Time Calculation (min) 42 min               Past Medical History:  Diagnosis Date   Angina pectoris ([place]) 02/03/2015   CAD (coronary artery disease), native coronary artery 08/30/2018   2007 [place], [place]: LIMA to LAD, Occluded SVG to RCA and OM by angiogram S/P  PCI/orbital atherectomy S/P 2.5x15 Resoluute to prox Cx on 11/29/2017.   Coronary artery disease    High cholesterol    Hypertension    Myocardial infarction [facility]) 2017/2018?   NSTEMI (non-ST elevated myocardial infarction) ([place]) 02/03/2015   Presence of permanent cardiac pacemaker    Medtronic   Type II diabetes mellitus ([place])    Past Surgical History:  Procedure Laterality Date   CARDIAC CATHETERIZATION N/A 02/04/2015   Procedure: Left Heart Cath and Cors/Grafts Angiography;  Surgeon: Adrian Prows, MD;  Location: [place] [facility];  Service: Cardiovascular;  Laterality: N/A;   CARDIAC CATHETERIZATION N/A 02/04/2015   Procedure: Coronary Stent Intervention;  Surgeon: Adrian Prows, MD;  Location: [place] [facility];  Service: Cardiovascular;  Laterality: N/A;   COLONOSCOPY     several   CORONARY ANGIOGRAPHY N/A 10/18/2017   Procedure: CORONARY ANGIOGRAPHY;  Surgeon: Adrian Prows, MD;  Location: [place] [facility];  Service: Cardiovascular;  Laterality: N/A;   CORONARY ANGIOPLASTY     CORONARY ANGIOPLASTY WITH STENT PLACEMENT     "i've got 5-6 stents total" (08/30/2017)   CORONARY ARTERY BYPASS GRAFT  2007   CABG X3   CORONARY ATHERECTOMY N/A 10/18/2017   Procedure: CORONARY ATHERECTOMY;  Surgeon:  Adrian Prows, MD;  Location: [place] [facility];  Service: Cardiovascular;  Laterality: N/A;   CORONARY ATHERECTOMY N/A 11/29/2017   Procedure: CORONARY ATHERECTOMY;  Surgeon: Adrian Prows, MD;  Location: [place] [facility];  Service: Cardiovascular;  Laterality: N/A;   CORONARY CTO INTERVENTION N/A 08/30/2017   Procedure: CORONARY CTO INTERVENTION;  Surgeon: Adrian Prows, MD;  Location: [place] [facility];  Service: Cardiovascular;  Laterality: N/A;   CORONARY STENT INTERVENTION N/A 11/29/2017   Procedure: CORONARY STENT INTERVENTION;  Surgeon: Adrian Prows, MD;  Location: [place] [facility];  Service: Cardiovascular;  Laterality: N/A;   INSERT / REPLACE / REMOVE PACEMAKER  ?2012   Now currently has Medtronic Pacemaker.  "I think it was placed in 2012"   KNEE ARTHROSCOPY Left 04/03/2020   Procedure: LEFT KNEE ARTHROSCOPY  WASHOUT;  Surgeon: Tania Ade, MD;  Location: [facility];  Service: Orthopedics;  Laterality: Left;   LEFT HEART CATH AND CORONARY ANGIOGRAPHY N/A 08/30/2017   Procedure: LEFT HEART CATH AND CORONARY ANGIOGRAPHY;  Surgeon: Adrian Prows, MD;  Location: [place] [facility];  Service: Cardiovascular;  Laterality: N/A;   LEFT HEART CATH AND CORS/GRAFTS ANGIOGRAPHY N/A 08/05/2017   Procedure: LEFT HEART CATH AND CORS/GRAFTS ANGIOGRAPHY;  Surgeon: Adrian Prows, MD;  Location: [place] [facility];  Service: Cardiovascular;  Laterality: N/A;   LEFT HEART CATH AND CORS/GRAFTS ANGIOGRAPHY N/A 06/30/2021  Procedure: LEFT HEART CATH AND CORS/GRAFTS ANGIOGRAPHY;  Surgeon: Adrian Prows, MD;  Location: [place] [facility];  Service: Cardiovascular;  Laterality: N/A;   POSTERIOR CERVICAL LAMINECTOMY N/A 04/16/2022   Procedure: POSTERIOR CERVICAL LAMINECTOMY, CERVICAL THREE-CERVICAL FOUR;  Surgeon: Consuella Lose, MD;  Location: [place];  Service: Neurosurgery;  Laterality: N/A;  3C   PPM GENERATOR CHANGEOUT N/A 06/17/2021   Procedure: PPM GENERATOR CHANGEOUT;  Surgeon: Evans Lance, MD;  Location: [place] [facility];  Service: Cardiovascular;  Laterality: N/A;   Patient Active Problem List   Diagnosis Date Noted   Stenosis of cervical spine with myelopathy ([place]) 04/16/2022   Enrolled in clinical trial: OCEANIC-AF (Asundexian - factor XIa inhibitor PO BID vs Apixaban PO BID in patients with A. Fib for stroke prevention. 03/26/2022   PAF (paroxysmal atrial fibrillation) ([place]). CHA2DS2-VASc Score is 5 (A, HTN, DM Vasc Dz). 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 12/15/2018   Flu-like symptoms 09/04/2018   Troponin level elevated 09/02/2018   CAD (coronary artery disease), native coronary artery 08/30/2018   Essential hypertension    Mixed hyperlipidemia    Angina pectoris ([place]) 02/03/2015   Post PTCA 02/03/2015   Diabetes mellitus ([place]) 02/03/2015   Sick sinus syndrome ([place])    Pacemaker Medtronic dual chamber AzureT XT DR MRI SureScan Gen change 06/17/21 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    ONSET DATE: Surgery 04/16/22  REFERRING DIAG: M48.02 (ICD-10-CM) - Spinal stenosis, cervical region   THERAPY DIAG:  Muscle weakness (generalized)  Other lack of coordination  Other disturbances of skin sensation  Rationale for Evaluation and Treatment Rehabilitation  SUBJECTIVE:   SUBJECTIVE STATEMENT: Pt reports she (PT) worked me over today. Pt accompanied by: self  PERTINENT HISTORY: Angina, CAD, Hx of MI in 2016, Pacemaker placement in 2007, s/p cervical laminectomy 04/16/22, polio as a child  PRECAUTIONS: Cervical, Fall, and ICD/Pacemaker - recent cervical laminectomy  WEIGHT BEARING RESTRICTIONS: No  PAIN:  Are you having pain? Yes: NPRS scale: 1/10 Pain location: L side of neck Pain description: tightness Aggravating factors: head rotation Relieving factors:  rest  FALLS: Has patient fallen in last 6 months? No   PATIENT GOALS: to have better use of hands  OBJECTIVE:   HAND DOMINANCE: Ambidextrous, learned to use left hand as child as his RUE was effected by polio  FUNCTIONAL OUTCOME MEASURES: Quick Dash: 31.8  UPPER EXTREMITY ROM: Pt reports polio impacted L upper arm more than R upper arm, but R hand impacted significantly more and with no L hand impairments from polio.  Active ROM Right eval Left eval  Shoulder flexion WFL   82  Shoulder abduction    Shoulder adduction 115 78  Shoulder extension    Shoulder internal rotation WFL 90%  Shoulder external rotation WFL 90%  Elbow flexion [facility] WFL  Elbow extension [facility] WFL  Wrist flexion    Wrist extension    Wrist ulnar deviation    Wrist radial deviation    Wrist pronation    Wrist supination    (Blank rows = not tested)  UPPER EXTREMITY MMT:     MMT Right eval Left eval  Shoulder flexion 3+/5 3-/5   Shoulder abduction    Shoulder adduction    Shoulder extension  Shoulder internal rotation    Shoulder external rotation    Middle trapezius    Lower trapezius    Elbow flexion 4-/5 3/5  Elbow extension 4-/5 3-/5  Wrist flexion    Wrist extension    Wrist ulnar deviation    Wrist radial deviation    Wrist pronation    Wrist supination    (Blank rows = not tested)  HAND FUNCTION: Grip strength: Right: 10 lbs; Left: 45 lbs, Lateral pinch: Right: 2 lbs, Left: 12 lbs, and 3 point pinch: Right: 0 lbs, Left: 8 lbs  COORDINATION: Finger Nose Finger test: moderate dysmetria on R, minimal dysmetria on L (completed half as many on R as L in same time frame) 9 Hole Peg test: Right: 1:29.34 (utilized LUE to pick up and place pegs in to R hand d/t polio and removed pegs by grasping between 3-4 digit) sec; Left: 1:25.69 sec Box and Blocks:  Right 31blocks, Left 41blocks  SENSATION: Reports numbness and tingling in hands and feet     TODAY'S  TREATMENT: 06/01/22 Therapeutic exercises: BUE snow angels - pt limited to 80-90* shoulder flexion in supine.  Attempted open book stretch in sidelying on L shoulder, however pt with increased pain in B shoulders, therefore terminated task.  AAROM: closed -chain activity with dowel completing shoulder flexion to 90* and chest press. OT providing min cues for technique and visual attention to LUE for improved symmetry.  Completed each exercise 2x10. Theraputty: engaged in [place] task with rolling putty in to ball, flattening into pancake, and then removing marbles from putty.  Pt incorporating RUE as gross assist (due to impairments s/p polio) but able to stabilize and maintain gross grasp on putty during all tasks.      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  Pieces quite small, however pt able to pick up with gross lateral pinch. Pt reports improvement from even a few weeks ago.   Pill box assessment: Completed just one day in 2:52 with no errors.  Pt with increased difficulty with opening each pill box slot. Discussed options for pill box as pt with difficulty opening pill box.  Pt reports that his opens with a push which he is able to complete.  OT educated on using towel when administering medications or working with small pieces to allow for visual contrast as well as textured surface to decrease items from rolling away.  AE education: OT provided recommendations and demonstrations for AE to assist with grasp and opening containers.  Pt reports having a multi-level jar opener and bottle opener already.  Educated on use of "adaptive" when searching online for tools to aid in ADLs/IADLs. Coordination: engaged in isolated finger exercises with abduction/adduction, finger extension, gross grasp, and thumb opposition.  Pt demonstrating impairments on R at baseline s/p polio as well as some new difficulties s/p laminectomy on L.  Engaged in in-hand  manipulation and translation with small stones.  OT educated on how to upgrade/downgrade task to meet appropriate challenge.   PATIENT EDUCATION: Education details: Educated on AE, modifications to set up to increase success with administering pills, HEP for coordination Person educated: Patient Education method: Explanation Education comprehension: verbalized understanding  HOME EXERCISE PROGRAM: Theraputty exercises  Access Code: [email or web address] URL: [email or web address] Date: 06/01/2022 Prepared by: [facility]  Exercises - Seated Shoulder Flexion with Dowel to 90  - 1 x daily - 2 sets -  10 reps - Seated Dowel Chest Press  - 1 x daily - 2 sets - 10 reps - Snow Angels  - 1 x daily - 2 sets - 10 reps   GOALS: Goals reviewed with patient? Yes  SHORT TERM GOALS: Target date: 06/18/22  Pt will be independent with HEP for fine and gross motor coordination and strengthening. Baseline: Goal status: IN PROGRESS  2.  Pt will verbalize understanding of task modifications and/or potential AE needs to increase ease, safety, and independence w/ ADLs. Baseline:  Goal status: IN PROGRESS   LONG TERM GOALS: Target date: 07/16/22  Pt will demonstrate improved L shoulder flexion to retrieve light weight object from cabinet at moderate height with use of BUE as needed. Baseline:  Goal status: IN PROGRESS  2.  Pt will demonstrate improved fine motor coordination for ADLs as evidenced by decreasing 9 hole peg test score for LUE by 10 sec and RUE by 5 secs Baseline: 9 Hole Peg test: Right: 1:29.34 (utilized LUE to pick up and place pegs in to R hand d/t polio and removed pegs by grasping between 3-4 digit) sec; Left: 1:25.69 sec Goal status: IN PROGRESS  3.  Pt will demonstrate improved ability to open containers and cut food with use of AE as needed. Baseline:  Goal status: IN PROGRESS  4.  Pt will report improved functional use of BUE as  demonstrated by improved score on QuickDash to </= to 25 Baseline: 31.8 Goal status: IN PROGRESS   ASSESSMENT:  CLINICAL IMPRESSION: Treatment session with focus on isolated finger activities and BUE coordination and strengthening exercises and activities.  Pt demonstrating good use of RUE as gross assist during theraputty tasks, limited in shoulder ROM and with pain in shoulders when in sidelying - therefore modified tasks to supine or sitting only.  PERFORMANCE DEFICITS: in functional skills including ADLs, IADLs, coordination, dexterity, sensation, ROM, strength, pain, flexibility, Fine motor control, Gross motor control, balance, body mechanics, decreased knowledge of use of DME, and UE functional use and psychosocial skills including environmental adaptation.   IMPAIRMENTS: are limiting patient from ADLs and IADLs.   CO-MORBIDITIES; may have co-morbidities  that affects occupational performance. Patient will benefit from skilled OT to address above impairments and improve overall function.  MODIFICATION OR ASSISTANCE TO COMPLETE EVALUATION: Min-Moderate modification of tasks or assist with assess necessary to complete an evaluation.  OT OCCUPATIONAL PROFILE AND HISTORY: Detailed assessment: Review of records and additional review of physical, cognitive, psychosocial history related to current functional performance.  CLINICAL DECISION MAKING: Moderate - several treatment options, min-mod task modification necessary  REHAB POTENTIAL: Good  EVALUATION COMPLEXITY: Moderate    PLAN:  OT FREQUENCY: 1-2x/week  OT DURATION: 8 weeks  PLANNED INTERVENTIONS: self care/ADL training, therapeutic exercise, therapeutic activity, neuromuscular re-education, manual therapy, passive range of motion, functional mobility training, splinting, moist heat, cryotherapy, patient/family education, psychosocial skills training, energy conservation, coping strategies training, and DME and/or AE  instructions  RECOMMENDED OTHER SERVICES: NA  CONSULTED AND AGREED WITH PLAN OF CARE: Patient  PLAN FOR NEXT SESSION: Review and continue to add to coordination and strengthening HEP (both hands and L shoulder).     Simonne Come, OTR/L 06/01/2022, 10:18 AM

## 2022-06-03 ENCOUNTER — Ambulatory Visit: Payer: Medicare HMO | Admitting: Rehabilitative and Restorative Service Providers"

## 2022-06-03 ENCOUNTER — Encounter: Payer: Self-pay | Admitting: Rehabilitative and Restorative Service Providers"

## 2022-06-03 DIAGNOSIS — M542 Cervicalgia: Secondary | ICD-10-CM

## 2022-06-03 DIAGNOSIS — M6281 Muscle weakness (generalized): Secondary | ICD-10-CM

## 2022-06-03 DIAGNOSIS — R252 Cramp and spasm: Secondary | ICD-10-CM | POA: Diagnosis not present

## 2022-06-03 DIAGNOSIS — R2689 Other abnormalities of gait and mobility: Secondary | ICD-10-CM

## 2022-06-03 DIAGNOSIS — R208 Other disturbances of skin sensation: Secondary | ICD-10-CM | POA: Diagnosis not present

## 2022-06-03 DIAGNOSIS — R278 Other lack of coordination: Secondary | ICD-10-CM | POA: Diagnosis not present

## 2022-06-03 NOTE — Therapy (Signed)
OUTPATIENT PHYSICAL THERAPY TREATMENT NOTE   Patient Name: Jesse [name] MRN: [id number] DOB:Feb 05, 1941, 81 y.o., male Today's Date: 06/03/2022   PT End of Session - 06/03/22 0933     Visit Number 7    Date for PT Re-Evaluation 07/02/22    Authorization Type [employer]    Authorization Time Period 05/11/2022 - 07/02/2022    Authorization - Visit Number 7    Authorization - Number of Visits 16    Progress Note Due on Visit 10    PT Start Time 9735    PT Stop Time 1010    PT Time Calculation (min) 39 min    Activity Tolerance Patient tolerated treatment well    Behavior During Therapy [facility] for tasks assessed/performed              Past Medical History:  Diagnosis Date   Angina pectoris ([place]) 02/03/2015   CAD (coronary artery disease), native coronary artery 08/30/2018   2007 [place], [place]: LIMA to LAD, Occluded SVG to RCA and OM by angiogram S/P  PCI/orbital atherectomy S/P 2.5x15 Resoluute to prox Cx on 11/29/2017.   Coronary artery disease    High cholesterol    Hypertension    Myocardial infarction [facility]) 2017/2018?   NSTEMI (non-ST elevated myocardial infarction) ([place]) 02/03/2015   Presence of permanent cardiac pacemaker    Medtronic   Type II diabetes mellitus ([place])    Past Surgical History:  Procedure Laterality Date   CARDIAC CATHETERIZATION N/A 02/04/2015   Procedure: Left Heart Cath and Cors/Grafts Angiography;  Surgeon: Adrian Prows, MD;  Location: [place] [facility];  Service: Cardiovascular;  Laterality: N/A;   CARDIAC CATHETERIZATION N/A 02/04/2015   Procedure: Coronary Stent Intervention;  Surgeon: Adrian Prows, MD;  Location: [place] [facility];  Service: Cardiovascular;  Laterality: N/A;   COLONOSCOPY     several   CORONARY ANGIOGRAPHY N/A 10/18/2017   Procedure: CORONARY ANGIOGRAPHY;  Surgeon: Adrian Prows, MD;  Location: [place] [facility];  Service: Cardiovascular;  Laterality: N/A;   CORONARY ANGIOPLASTY     CORONARY ANGIOPLASTY WITH STENT PLACEMENT      "i've got 5-6 stents total" (08/30/2017)   CORONARY ARTERY BYPASS GRAFT  2007   CABG X3   CORONARY ATHERECTOMY N/A 10/18/2017   Procedure: CORONARY ATHERECTOMY;  Surgeon: Adrian Prows, MD;  Location: [place] [facility];  Service: Cardiovascular;  Laterality: N/A;   CORONARY ATHERECTOMY N/A 11/29/2017   Procedure: CORONARY ATHERECTOMY;  Surgeon: Adrian Prows, MD;  Location: [place] [facility];  Service: Cardiovascular;  Laterality: N/A;   CORONARY CTO INTERVENTION N/A 08/30/2017   Procedure: CORONARY CTO INTERVENTION;  Surgeon: Adrian Prows, MD;  Location: [place] [facility];  Service: Cardiovascular;  Laterality: N/A;   CORONARY STENT INTERVENTION N/A 11/29/2017   Procedure: CORONARY STENT INTERVENTION;  Surgeon: Adrian Prows, MD;  Location: [place] [facility];  Service: Cardiovascular;  Laterality: N/A;   INSERT / REPLACE / REMOVE PACEMAKER  ?2012   Now currently has Medtronic Pacemaker.  "I think it was placed in 2012"   KNEE ARTHROSCOPY Left 04/03/2020   Procedure: LEFT KNEE ARTHROSCOPY  WASHOUT;  Surgeon: Tania Ade, MD;  Location: [facility];  Service: Orthopedics;  Laterality: Left;   LEFT HEART CATH AND CORONARY ANGIOGRAPHY N/A 08/30/2017   Procedure: LEFT HEART CATH AND CORONARY ANGIOGRAPHY;  Surgeon: Adrian Prows, MD;  Location: [place] [facility];  Service: Cardiovascular;  Laterality: N/A;   LEFT HEART CATH AND CORS/GRAFTS ANGIOGRAPHY N/A 08/05/2017  Procedure: LEFT HEART CATH AND CORS/GRAFTS ANGIOGRAPHY;  Surgeon: Adrian Prows, MD;  Location: [place] [facility];  Service: Cardiovascular;  Laterality: N/A;   LEFT HEART CATH AND CORS/GRAFTS ANGIOGRAPHY N/A 06/30/2021   Procedure: LEFT HEART CATH AND CORS/GRAFTS ANGIOGRAPHY;  Surgeon: Adrian Prows, MD;  Location: [place] [facility];  Service: Cardiovascular;  Laterality: N/A;   POSTERIOR CERVICAL LAMINECTOMY N/A 04/16/2022   Procedure: POSTERIOR CERVICAL LAMINECTOMY, CERVICAL THREE-CERVICAL FOUR;  Surgeon: Consuella Lose, MD;  Location: [place];   Service: Neurosurgery;  Laterality: N/A;  3C   PPM GENERATOR CHANGEOUT N/A 06/17/2021   Procedure: PPM GENERATOR CHANGEOUT;  Surgeon: Evans Lance, MD;  Location: [place] [facility];  Service: Cardiovascular;  Laterality: N/A;   Patient Active Problem List   Diagnosis Date Noted   Stenosis of cervical spine with myelopathy ([place]) 04/16/2022   Enrolled in clinical trial: OCEANIC-AF (Asundexian - factor XIa inhibitor PO BID vs Apixaban PO BID in patients with A. Fib for stroke prevention. 03/26/2022   PAF (paroxysmal atrial fibrillation) ([place]). CHA2DS2-VASc Score is 5 (A, HTN, DM Vasc Dz). 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 12/15/2018   Flu-like symptoms 09/04/2018   Troponin level elevated 09/02/2018   CAD (coronary artery disease), native coronary artery 08/30/2018   Essential hypertension    Mixed hyperlipidemia    Angina pectoris ([place]) 02/03/2015   Post PTCA 02/03/2015   Diabetes mellitus ([employer]) 02/03/2015   Sick sinus syndrome [facility])    Pacemaker Medtronic dual chamber AzureT XT DR MRI SureScan Gen change 06/17/21 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    PCP: Deland Pretty, MD  REFERRING PROVIDER: Consuella Lose, MD   REFERRING DIAG: [phone] (ICD-10-CM) - Spinal stenosis, cervical region   THERAPY DIAG:  Muscle weakness (generalized)  Cervicalgia  Cramp and spasm  Other abnormalities of gait and mobility  Rationale for Evaluation and Treatment Rehabilitation  ONSET DATE: s/p cervical laminectomy on 04/16/2022  SUBJECTIVE:                                                                                                                                                                                                         SUBJECTIVE STATEMENT: Pt reports that  he was fatigued following PT/OT session on Tuesday.  States that he rested for most of the day yesterday.  PERTINENT HISTORY:  Angina,  CAD, Hx of MI in 2016, Pacemaker placement in 2007, s/p cervical laminectomy 04/16/22, polio as a child  PAIN:  Are you having pain? No  PRECAUTIONS: Cervical, Fall, and ICD/Pacemaker recent cervical laminectomy  WEIGHT BEARING RESTRICTIONS No  FALLS:  Has patient fallen in last 6 months? No  LIVING ENVIRONMENT: Lives with: lives with their spouse Lives in: House/apartment Stairs: Yes: External: 3 steps; can reach both Has following equipment at home: Gilford Rile - 4 wheeled, shower chair, and Grab bars  OCCUPATION: Retired Theatre manager  PLOF: Independent and Leisure: Museum/gallery curator, model train  PATIENT GOALS:  To walk normally again.  OBJECTIVE:   DIAGNOSTIC FINDINGS:  04/16/22 Cervical Radiographs IMPRESSION: Localization film for C3-C4 posterior laminectomy. Surgical instruments overlying the soft tissues posterior to the C2 through C4 spinous processes.  PATIENT SURVEYS:  05/11/2022:  FOTO 51% (projected 60% by visit 12)   COGNITION: Overall cognitive status: Within functional limits for tasks assessed   SENSATION: Reports numbness and tingling in hands and feet  POSTURE:  Right shoulder higher than left with side lean noted  PALPATION: Some muscle spasms noted in cervical region   CERVICAL ROM:   Active ROM A/ROM (deg) eval  Flexion 40  Extension 10  Right lateral flexion 15  Left lateral flexion 10  Right rotation 30  Left rotation 19   (Blank rows = not tested)  UPPER EXTREMITY ROM:  Active ROM Right eval Left eval  Shoulder flexion 126 105  Shoulder extension    Shoulder abduction 115 85  Shoulder adduction    Shoulder internal rotation    Shoulder external rotation    Elbow flexion    Elbow extension    Wrist flexion    Wrist extension    Wrist ulnar deviation    Wrist radial deviation    Wrist  pronation    Wrist supination     (Blank rows = not tested)  UPPER EXTREMITY AND LOWER EXTREMITY MMT: 05/11/2022:   Right UE strength of 4-/5, Left UE strength of 3/5 Bilateral LE strength of 4/5 grossly throughout  FUNCTIONAL TESTS:  05/11/2022: 5 times sit to stand: 15.3 sec Timed up and go (TUG): 13.4 sec  05/19/2022: 3 minute walk test:  451 ft without assistive device   TODAY'S TREATMENT:  Date:  06/03/2022 Nustep level 4 x6 min with PT present to discuss status Seated with 4#:  heel/toe raises, marching, LAQ, hip abduction scissors.  2x10 bilat each Seated chin tuck and cervical rotation.  2x10 each Seated clamshells with blue tband 2x10 Seated hamstring curls with blue tband 2x10 bilat Supine SLR 2x10, bilat  Supine shoulder flexion with 2# on cane 2x10 Supine chest press with 2# on cane 2x10 Supine horizontal abduction and shoulder ER with yellow tband 2x10 each Seated blue pball rollout 2x10 to encourage shoulder flexion, then scaption/abduction x10 bilat   Date:  06/01/2022 Nustep level 5 x6 min with PT present to discuss status Seated with 4#:  heel/toe raises, marching, LAQ, hip abduction scissors.  2x10 bilat each Seated clamshells with blue tband 2x10 Seated hamstring curls with blue tband 2x10 bilat Seated green pball rollout 2x10 to encourage shoulder flexion Seated lumbar/thoracic extension 2x10 Supine shoulder flexion with cane 2x10 Supine left shoulder abduction AA/ROM with cane 2x10 Supine SLR 2x10, bilat  Supine chest press with 2# on cane 2x10 Supine chin tuck 2x10 Sit to/from stand (elevated on purple foam) holding 4# dumbbell x10   05/28/2022 Nustep level 5 x6 min with  PT present to discuss status Seated with 3#:  marching, LAQ, hip abduction scissors.  2x10 bilat each Seated green pball rollout 2x10 to encourage shoulder flexion Shoulder rolls in posterior direction only x20  Seated lumbar/thoracic extension 2x10 Seated clamshells with blue  tband 2x10  Seated hamstring curls with blue tband 2x10 bilat Supine shoulder flexion with cane 2x10 Supine left shoulder abduction AA/ROM with cane 2x10 Supine SLR 2x10, bilat  Supine chest press with 2# on cane 2x10    PATIENT EDUCATION:  Education details: Issued HEP Person educated: Patient Education method: Explanation, Verbal cues, and Handouts Education comprehension: verbalized understanding and returned demonstration   HOME EXERCISE PROGRAM: Access Code: [email or web address] URL: [email or web address] Date: 05/25/2022 Prepared by: Shelby Dubin Yoceline Bazar  Exercises - Seated Cervical Rotation AROM  - 1 x daily - 7 x weekly - 2 sets - 10 reps - Seated Cervical Extension AROM  - 1 x daily - 7 x weekly - 2 sets - 10 reps - Seated Scapular Retraction  - 1 x daily - 7 x weekly - 2 sets - 10 reps - Seated March  - 1 x daily - 7 x weekly - 2 sets - 10 reps - Seated Long Arc Quad  - 1 x daily - 7 x weekly - 2 sets - 10 reps - Supine Shoulder Flexion with Dowel  - 1 x daily - 7 x weekly - 2 sets - 10 reps - Supine Shoulder Abduction AAROM with Dowel  - 1 x daily - 7 x weekly - 2 sets - 10 reps  ASSESSMENT:  CLINICAL IMPRESSION: Jesse [name] presents to skilled PT with reports of feeling some fatigue today and states that having 2 disciplines in one day was difficult.  Pt able to progress during session with minimal recovery periods required.  Pt continues to require skilled PT to progress towards goal related activities.    OBJECTIVE IMPAIRMENTS decreased activity tolerance, decreased balance, difficulty walking, decreased ROM, decreased strength, increased muscle spasms, impaired flexibility, impaired UE functional use, postural dysfunction, and pain.   ACTIVITY LIMITATIONS carrying, stairs, transfers, and reach over head  PARTICIPATION LIMITATIONS: cleaning, driving, and community activity  PERSONAL FACTORS Age and 3+ comorbidities: s/p cervical laminectomy on 04/16/22, polio in  childhood, s/p Pacemaker placement, DM, Angina  are also affecting patient's functional outcome.   REHAB POTENTIAL: Good  CLINICAL DECISION MAKING: Evolving/moderate complexity  EVALUATION COMPLEXITY: Moderate   GOALS: Goals reviewed with patient? Yes  SHORT TERM GOALS: Target date: 06/01/2022   Pt will be independent with initial HEP. Baseline:  Goal status: MET  2.  Pt will report at least a 30% improvement in symptoms since starting PT. Baseline: Reports 10% improvement on 06/01/22 Goal status: IN PROGRESS   LONG TERM GOALS: Target date: 07/02/2022  Pt will be independent with advanced HEP. Baseline:  Goal status: IN PROGRESS  2.  Pt will increase FOTO to at least 60% to demonstrate improvements in functional mobility. Baseline: 51% Goal status: INITIAL  3.  Pt will increase UE and LE bilateral strength to at least 4+/5 to allow him to more easily perform household tasks. Baseline: see above Goal status: INITIAL  4.  Pt will report ability to ambulate for at least 20 min without any increased pain or loss of balance to allow for community reintegration. Baseline:  Goal status: IN PROGRESS  5.  Pt will increase cervical A/ROM to [facility] to allow him to drive without difficulty or increased pain. Baseline: see above Goal status: INITIAL  PLAN: PT FREQUENCY: 2x/week  PT DURATION: 8 weeks  PLANNED INTERVENTIONS: Therapeutic exercises, Therapeutic activity, Neuromuscular re-education, Balance training, Gait training, Patient/Family education, Self Care, Joint mobilization, Joint manipulation, Stair training, Aquatic Therapy, Dry Needling, Spinal manipulation, Spinal mobilization, Cryotherapy, Moist heat, scar mobilization, Taping, Ultrasound, Ionotophoresis 65m/ml Dexamethasone, Manual therapy, and Re-evaluation  PLAN FOR NEXT SESSION: progress and assess HEP, strengthening, A/ROM, balance   SJuel Burrow PT 06/03/2022, 10:19 AM   [facility] [place] [place] [id number] # [phone] [phone]

## 2022-06-08 ENCOUNTER — Ambulatory Visit: Payer: Medicare HMO | Admitting: Rehabilitative and Restorative Service Providers"

## 2022-06-08 ENCOUNTER — Ambulatory Visit: Payer: Medicare HMO | Admitting: Occupational Therapy

## 2022-06-08 ENCOUNTER — Encounter: Payer: Self-pay | Admitting: Rehabilitative and Restorative Service Providers"

## 2022-06-08 DIAGNOSIS — R2689 Other abnormalities of gait and mobility: Secondary | ICD-10-CM | POA: Diagnosis not present

## 2022-06-08 DIAGNOSIS — M542 Cervicalgia: Secondary | ICD-10-CM

## 2022-06-08 DIAGNOSIS — R278 Other lack of coordination: Secondary | ICD-10-CM | POA: Diagnosis not present

## 2022-06-08 DIAGNOSIS — R252 Cramp and spasm: Secondary | ICD-10-CM

## 2022-06-08 DIAGNOSIS — M6281 Muscle weakness (generalized): Secondary | ICD-10-CM

## 2022-06-08 DIAGNOSIS — R208 Other disturbances of skin sensation: Secondary | ICD-10-CM

## 2022-06-08 NOTE — Therapy (Signed)
OUTPATIENT OCCUPATIONAL THERAPY   Treatment Note  Patient Name: Jesse [name] MRN: [id number] DOB:Dec 10, 1940, 81 y.o., male Today's Date: 06/08/2022  PCP: Deland Pretty, MD REFERRING PROVIDER: Consuella Lose, MD    OT End of Session - 06/08/22 1040     Visit Number 4    Number of Visits 15    Date for OT Re-Evaluation 07/16/22    Authorization Type [employer]    OT Start Time 1020    OT Stop Time 1100    OT Time Calculation (min) 40 min                Past Medical History:  Diagnosis Date   Angina pectoris ([place]) 02/03/2015   CAD (coronary artery disease), native coronary artery 08/30/2018   2007 [place], [place]: LIMA to LAD, Occluded SVG to RCA and OM by angiogram S/P  PCI/orbital atherectomy S/P 2.5x15 Resoluute to prox Cx on 11/29/2017.   Coronary artery disease    High cholesterol    Hypertension    Myocardial infarction [facility]) 2017/2018?   NSTEMI (non-ST elevated myocardial infarction) ([place]) 02/03/2015   Presence of permanent cardiac pacemaker    Medtronic   Type II diabetes mellitus ([place])    Past Surgical History:  Procedure Laterality Date   CARDIAC CATHETERIZATION N/A 02/04/2015   Procedure: Left Heart Cath and Cors/Grafts Angiography;  Surgeon: Adrian Prows, MD;  Location: [facility];  Service: Cardiovascular;  Laterality: N/A;   CARDIAC CATHETERIZATION N/A 02/04/2015   Procedure: Coronary Stent Intervention;  Surgeon: Adrian Prows, MD;  Location: [facility];  Service: Cardiovascular;  Laterality: N/A;   COLONOSCOPY     several   CORONARY ANGIOGRAPHY N/A 10/18/2017   Procedure: CORONARY ANGIOGRAPHY;  Surgeon: Adrian Prows, MD;  Location: [facility];  Service: Cardiovascular;  Laterality: N/A;   CORONARY ANGIOPLASTY     CORONARY ANGIOPLASTY WITH STENT PLACEMENT     "i've got 5-6 stents total" (08/30/2017)   CORONARY ARTERY BYPASS GRAFT  2007   CABG X3   CORONARY ATHERECTOMY N/A 10/18/2017   Procedure: CORONARY ATHERECTOMY;   Surgeon: Adrian Prows, MD;  Location: [facility];  Service: Cardiovascular;  Laterality: N/A;   CORONARY ATHERECTOMY N/A 11/29/2017   Procedure: CORONARY ATHERECTOMY;  Surgeon: Adrian Prows, MD;  Location: [place];  Service: Cardiovascular;  Laterality: N/A;   CORONARY CTO INTERVENTION N/A 08/30/2017   Procedure: CORONARY CTO INTERVENTION;  Surgeon: Adrian Prows, MD;  Location: [facility];  Service: Cardiovascular;  Laterality: N/A;   CORONARY STENT INTERVENTION N/A 11/29/2017   Procedure: CORONARY STENT INTERVENTION;  Surgeon: Adrian Prows, MD;  Location: [facility];  Service: Cardiovascular;  Laterality: N/A;   INSERT / REPLACE / REMOVE PACEMAKER  ?2012   Now currently has Medtronic Pacemaker.  "I think it was placed in 2012"   KNEE ARTHROSCOPY Left 04/03/2020   Procedure: LEFT KNEE ARTHROSCOPY  WASHOUT;  Surgeon: Tania Ade, MD;  Location: [facility];  Service: Orthopedics;  Laterality: Left;   LEFT HEART CATH AND CORONARY ANGIOGRAPHY N/A 08/30/2017   Procedure: LEFT HEART CATH AND CORONARY ANGIOGRAPHY;  Surgeon: Adrian Prows, MD;  Location: [facility];  Service: Cardiovascular;  Laterality: N/A;   LEFT HEART CATH AND CORS/GRAFTS ANGIOGRAPHY N/A 08/05/2017   Procedure: LEFT HEART CATH AND CORS/GRAFTS ANGIOGRAPHY;  Surgeon: Adrian Prows, MD;  Location: [facility];  Service: Cardiovascular;  Laterality: N/A;   LEFT HEART CATH AND CORS/GRAFTS ANGIOGRAPHY N/A  06/30/2021   Procedure: LEFT HEART CATH AND CORS/GRAFTS ANGIOGRAPHY;  Surgeon: Adrian Prows, MD;  Location: [facility];  Service: Cardiovascular;  Laterality: N/A;   POSTERIOR CERVICAL LAMINECTOMY N/A 04/16/2022   Procedure: POSTERIOR CERVICAL LAMINECTOMY, CERVICAL THREE-CERVICAL FOUR;  Surgeon: Consuella Lose, MD;  Location: [facility];  Service: Neurosurgery;  Laterality: N/A;  3C   PPM GENERATOR CHANGEOUT N/A 06/17/2021   Procedure: PPM GENERATOR CHANGEOUT;  Surgeon: Evans Lance, MD;   Location: [facility];  Service: Cardiovascular;  Laterality: N/A;   Patient Active Problem List   Diagnosis Date Noted   Stenosis of cervical spine with myelopathy ([place]) 04/16/2022   Enrolled in clinical trial: OCEANIC-AF (Asundexian - factor XIa inhibitor PO BID vs Apixaban PO BID in patients with A. Fib for stroke prevention. 03/26/2022   PAF (paroxysmal atrial fibrillation) ([place]). CHA2DS2-VASc Score is 5 (A, HTN, DM Vasc Dz). 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 12/15/2018   Flu-like symptoms 09/04/2018   Troponin level elevated 09/02/2018   CAD (coronary artery disease), native coronary artery 08/30/2018   Essential hypertension    Mixed hyperlipidemia    Angina pectoris ([place]) 02/03/2015   Post PTCA 02/03/2015   Diabetes mellitus ([place]) 02/03/2015   Sick sinus syndrome ([place])    Pacemaker Medtronic dual chamber AzureT XT DR MRI SureScan Gen change 06/17/21 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    ONSET DATE: Surgery 04/16/22  REFERRING DIAG: M48.02 (ICD-10-CM) - Spinal stenosis, cervical region   THERAPY DIAG:  Other lack of coordination  Other disturbances of skin sensation  Muscle weakness (generalized)  Rationale for Evaluation and Treatment Rehabilitation  SUBJECTIVE:   SUBJECTIVE STATEMENT: Pt reports tired after PT session prior to OT session.   Pt accompanied by: self  PERTINENT HISTORY: Angina, CAD, Hx of MI in 2016, Pacemaker placement in 2007, s/p cervical laminectomy 04/16/22, polio as a child  PRECAUTIONS: Cervical, Fall, and ICD/Pacemaker - recent cervical laminectomy  WEIGHT BEARING RESTRICTIONS: No  PAIN:  Are you having pain? No  FALLS: Has patient fallen in last 6 months? No   PATIENT GOALS: to have better use of  hands  OBJECTIVE:   HAND DOMINANCE: Ambidextrous, learned to use left hand as child as his RUE was effected by polio  FUNCTIONAL OUTCOME MEASURES: Quick Dash: 31.8  UPPER EXTREMITY ROM: Pt reports polio impacted L upper arm more than R upper arm, but R hand impacted significantly more and with no L hand impairments from polio.  Active ROM Right eval Left eval  Shoulder flexion WFL   82  Shoulder abduction    Shoulder adduction 115 78  Shoulder extension    Shoulder internal rotation WFL 90%  Shoulder external rotation WFL 90%  Elbow flexion [employer] WFL  Elbow extension [facility] WFL  Wrist flexion    Wrist extension    Wrist ulnar deviation    Wrist radial deviation    Wrist pronation    Wrist supination    (Blank rows = not tested)  UPPER EXTREMITY MMT:     MMT Right eval Left eval  Shoulder flexion 3+/5 3-/5   Shoulder abduction    Shoulder adduction    Shoulder extension    Shoulder internal rotation    Shoulder external rotation  Middle trapezius    Lower trapezius    Elbow flexion 4-/5 3/5  Elbow extension 4-/5 3-/5  Wrist flexion    Wrist extension    Wrist ulnar deviation    Wrist radial deviation    Wrist pronation    Wrist supination    (Blank rows = not tested)  HAND FUNCTION: Grip strength: Right: 10 lbs; Left: 45 lbs, Lateral pinch: Right: 2 lbs, Left: 12 lbs, and 3 point pinch: Right: 0 lbs, Left: 8 lbs  COORDINATION: Finger Nose Finger test: moderate dysmetria on R, minimal dysmetria on L (completed half as many on R as L in same time frame) 9 Hole Peg test: Right: 1:29.34 (utilized LUE to pick up and place pegs in to R hand d/t polio and removed pegs by grasping between 3-4 digit) sec; Left: 1:25.69 sec Box and Blocks:  Right 31blocks, Left 41blocks  SENSATION: Reports numbness and tingling in hands and feet     TODAY'S TREATMENT: 06/08/22 Coordination: engaged in isolated finger exercises with abduction/adduction, finger extension, gross  grasp, and thumb opposition. Pt able to modify tasks as needed to accommodate for impairments s/p polio.  Engaged in picking up variety of small items one at a time and placing in container.  Pt utilizing LUE as primary UE due to impairments in RUE s/p polio but able to utilize RUE as stabilizer to gross assist as needed.  Progressed to in-hand manipulation and translation with 5 small beads and beans from finger tip > palm > finger tips to place into container.  Pt demonstrating mod difficulty with beans but able to complete with increased time and effort. Stacking checker pieces and flipping/dealing cards with focus on precision pinch and motor control.  Pt needing to slide cards towards edge of table to facilitate increased coordination to flip/rotate cards.   Resistance Clothespins: 2# with LUE for low functional reaching and sustained pinch. Pt initially completing with gross grasp, therefore modified task to facilitate increased pinch.  OT providing min cues for movement pattern and technique.  Pt completing task with lateral pinch, when attempted to complete with 3 jaw chuck pt unable to achieve position and maintain against resistance of 1 or 2# clothespins.  Discussed attempting 3 jaw chuck and precision pinch in massed practice at home.   06/01/22 Therapeutic exercises: BUE snow angels - pt limited to 80-90* shoulder flexion in supine.  Attempted open book stretch in sidelying on L shoulder, however pt with increased pain in B shoulders, therefore terminated task.  AAROM: closed -chain activity with dowel completing shoulder flexion to 90* and chest press. OT providing min cues for technique and visual attention to LUE for improved symmetry.  Completed each exercise 2x10. Theraputty: engaged in [place] task with rolling putty in to ball, flattening into pancake, and then removing marbles from putty.  Pt incorporating RUE as gross assist (due to impairments s/p polio) but able to stabilize and maintain  gross grasp on putty during all tasks.      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  Pieces quite small, however pt able to pick up with gross lateral pinch. Pt reports improvement from even a few weeks ago.   Pill box assessment: Completed just one day in 2:52 with no errors.  Pt with increased difficulty with opening each pill box slot. Discussed options for pill box as pt with difficulty opening pill box.  Pt reports that his opens with a  push which he is able to complete.  OT educated on using towel when administering medications or working with small pieces to allow for visual contrast as well as textured surface to decrease items from rolling away.  AE education: OT provided recommendations and demonstrations for AE to assist with grasp and opening containers.  Pt reports having a multi-level jar opener and bottle opener already.  Educated on use of "adaptive" when searching online for tools to aid in ADLs/IADLs. Coordination: engaged in isolated finger exercises with abduction/adduction, finger extension, gross grasp, and thumb opposition.  Pt demonstrating impairments on R at baseline s/p polio as well as some new difficulties s/p laminectomy on L.  Engaged in in-hand manipulation and translation with small stones.  OT educated on how to upgrade/downgrade task to meet appropriate challenge.   PATIENT EDUCATION: Education details: HEP for coordination and techniques to increase/decrease challenge Person educated: Patient Education method: Explanation, Demonstration, Verbal cues, and Handouts Education comprehension: verbalized understanding  HOME EXERCISE PROGRAM: Theraputty exercises and [facility] Handout  Access Code: [email or web address] URL: [email or web address] Date: 06/01/2022 Prepared by: [facility]  Exercises - Seated Shoulder Flexion with Dowel to 90  - 1 x daily - 2 sets - 10 reps -  Seated Dowel Chest Press  - 1 x daily - 2 sets - 10 reps - [place]  - 1 x daily - 2 sets - 10 reps   GOALS: Goals reviewed with patient? Yes  SHORT TERM GOALS: Target date: 06/18/22  Pt will be independent with HEP for fine and gross motor coordination and strengthening. Baseline: Goal status: IN PROGRESS  2.  Pt will verbalize understanding of task modifications and/or potential AE needs to increase ease, safety, and independence w/ ADLs. Baseline:  Goal status: IN PROGRESS   LONG TERM GOALS: Target date: 07/16/22  Pt will demonstrate improved L shoulder flexion to retrieve light weight object from cabinet at moderate height with use of BUE as needed. Baseline:  Goal status: IN PROGRESS  2.  Pt will demonstrate improved fine motor coordination for ADLs as evidenced by decreasing 9 hole peg test score for LUE by 10 sec and RUE by 5 secs Baseline: 9 Hole Peg test: Right: 1:29.34 (utilized LUE to pick up and place pegs in to R hand d/t polio and removed pegs by grasping between 3-4 digit) sec; Left: 1:25.69 sec Goal status: IN PROGRESS  3.  Pt will demonstrate improved ability to open containers and cut food with use of AE as needed. Baseline:  Goal status: IN PROGRESS  4.  Pt will report improved functional use of BUE as demonstrated by improved score on QuickDash to </= to 25 Baseline: 31.8 Goal status: IN PROGRESS   ASSESSMENT:  CLINICAL IMPRESSION: Treatment session with focus on isolated finger activities, coordination with LUE as use of RUE as stabilizer to gross assist, as well as strengthening exercises.  Pt demonstrating good use of RUE as gross assist during coordination activities, stabilizing cup and assisting with placing/positioning of smaller items.  Pt demonstrating use of gross grasp and lateral pinch due to decreased precision pinch.  PERFORMANCE DEFICITS: in functional skills including ADLs, IADLs, coordination, dexterity, sensation, ROM, strength, pain,  flexibility, Fine motor control, Gross motor control, balance, body mechanics, decreased knowledge of use of DME, and UE functional use and psychosocial skills including environmental adaptation.   IMPAIRMENTS: are limiting patient from ADLs and IADLs.   CO-MORBIDITIES; may have co-morbidities  that affects occupational  performance. Patient will benefit from skilled OT to address above impairments and improve overall function.  MODIFICATION OR ASSISTANCE TO COMPLETE EVALUATION: Min-Moderate modification of tasks or assist with assess necessary to complete an evaluation.  OT OCCUPATIONAL PROFILE AND HISTORY: Detailed assessment: Review of records and additional review of physical, cognitive, psychosocial history related to current functional performance.  CLINICAL DECISION MAKING: Moderate - several treatment options, min-mod task modification necessary  REHAB POTENTIAL: Good  EVALUATION COMPLEXITY: Moderate    PLAN:  OT FREQUENCY: 1-2x/week  OT DURATION: 8 weeks  PLANNED INTERVENTIONS: self care/ADL training, therapeutic exercise, therapeutic activity, neuromuscular re-education, manual therapy, passive range of motion, functional mobility training, splinting, moist heat, cryotherapy, patient/family education, psychosocial skills training, energy conservation, coping strategies training, and DME and/or AE instructions  RECOMMENDED OTHER SERVICES: NA  CONSULTED AND AGREED WITH PLAN OF CARE: Patient  PLAN FOR NEXT SESSION: Review coordination and strengthening HEP (both hands and L shoulder).  Focus on [place] and assess 9 hole peg test at next session.   Simonne Come, OTR/L 06/08/2022, 10:40 AM

## 2022-06-08 NOTE — Therapy (Signed)
OUTPATIENT PHYSICAL THERAPY TREATMENT NOTE   Patient Name: Jesse [name] MRN: [id number] DOB:1941/02/15, 81 y.o., male Today's Date: 06/08/2022   PT End of Session - 06/08/22 0936     Visit Number 8    Date for PT Re-Evaluation 07/02/22    Authorization Type [employer]    Authorization Time Period 05/11/2022 - 07/02/2022    Authorization - Visit Number 8    Authorization - Number of Visits 16    Progress Note Due on Visit 10    PT Start Time 3614    PT Stop Time 1010    PT Time Calculation (min) 39 min    Activity Tolerance Patient tolerated treatment well    Behavior During Therapy [facility] for tasks assessed/performed              Past Medical History:  Diagnosis Date   Angina pectoris ([place]) 02/03/2015   CAD (coronary artery disease), native coronary artery 08/30/2018   2007 [place], [place]: LIMA to LAD, Occluded SVG to RCA and OM by angiogram S/P  PCI/orbital atherectomy S/P 2.5x15 Resoluute to prox Cx on 11/29/2017.   Coronary artery disease    High cholesterol    Hypertension    Myocardial infarction [facility]) 2017/2018?   NSTEMI (non-ST elevated myocardial infarction) ([place]) 02/03/2015   Presence of permanent cardiac pacemaker    Medtronic   Type II diabetes mellitus ([place])    Past Surgical History:  Procedure Laterality Date   CARDIAC CATHETERIZATION N/A 02/04/2015   Procedure: Left Heart Cath and Cors/Grafts Angiography;  Surgeon: Adrian Prows, MD;  Location: [place] [facility];  Service: Cardiovascular;  Laterality: N/A;   CARDIAC CATHETERIZATION N/A 02/04/2015   Procedure: Coronary Stent Intervention;  Surgeon: Adrian Prows, MD;  Location: [place] [facility];  Service: Cardiovascular;  Laterality: N/A;   COLONOSCOPY     several   CORONARY ANGIOGRAPHY N/A 10/18/2017   Procedure: CORONARY ANGIOGRAPHY;  Surgeon: Adrian Prows, MD;  Location: [place] [facility];  Service: Cardiovascular;  Laterality: N/A;   CORONARY ANGIOPLASTY     CORONARY ANGIOPLASTY WITH STENT PLACEMENT      "i've got 5-6 stents total" (08/30/2017)   CORONARY ARTERY BYPASS GRAFT  2007   CABG X3   CORONARY ATHERECTOMY N/A 10/18/2017   Procedure: CORONARY ATHERECTOMY;  Surgeon: Adrian Prows, MD;  Location: [place] [facility];  Service: Cardiovascular;  Laterality: N/A;   CORONARY ATHERECTOMY N/A 11/29/2017   Procedure: CORONARY ATHERECTOMY;  Surgeon: Adrian Prows, MD;  Location: [place] [facility];  Service: Cardiovascular;  Laterality: N/A;   CORONARY CTO INTERVENTION N/A 08/30/2017   Procedure: CORONARY CTO INTERVENTION;  Surgeon: Adrian Prows, MD;  Location: [place] [facility];  Service: Cardiovascular;  Laterality: N/A;   CORONARY STENT INTERVENTION N/A 11/29/2017   Procedure: CORONARY STENT INTERVENTION;  Surgeon: Adrian Prows, MD;  Location: [place] [facility];  Service: Cardiovascular;  Laterality: N/A;   INSERT / REPLACE / REMOVE PACEMAKER  ?2012   Now currently has Medtronic Pacemaker.  "I think it was placed in 2012"   KNEE ARTHROSCOPY Left 04/03/2020   Procedure: LEFT KNEE ARTHROSCOPY  WASHOUT;  Surgeon: Tania Ade, MD;  Location: [facility];  Service: Orthopedics;  Laterality: Left;   LEFT HEART CATH AND CORONARY ANGIOGRAPHY N/A 08/30/2017   Procedure: LEFT HEART CATH AND CORONARY ANGIOGRAPHY;  Surgeon: Adrian Prows, MD;  Location: [place] [facility];  Service: Cardiovascular;  Laterality: N/A;   LEFT HEART CATH AND CORS/GRAFTS ANGIOGRAPHY N/A 08/05/2017  Procedure: LEFT HEART CATH AND CORS/GRAFTS ANGIOGRAPHY;  Surgeon: Adrian Prows, MD;  Location: [place] [facility];  Service: Cardiovascular;  Laterality: N/A;   LEFT HEART CATH AND CORS/GRAFTS ANGIOGRAPHY N/A 06/30/2021   Procedure: LEFT HEART CATH AND CORS/GRAFTS ANGIOGRAPHY;  Surgeon: Adrian Prows, MD;  Location: [place] [facility];  Service: Cardiovascular;  Laterality: N/A;   POSTERIOR CERVICAL LAMINECTOMY N/A 04/16/2022   Procedure: POSTERIOR CERVICAL LAMINECTOMY, CERVICAL THREE-CERVICAL FOUR;  Surgeon: Consuella Lose, MD;  Location: [place];  Service: Neurosurgery;  Laterality: N/A;  3C   PPM GENERATOR CHANGEOUT N/A 06/17/2021   Procedure: PPM GENERATOR CHANGEOUT;  Surgeon: Evans Lance, MD;  Location: [place] [facility];  Service: Cardiovascular;  Laterality: N/A;   Patient Active Problem List   Diagnosis Date Noted   Stenosis of cervical spine with myelopathy ([place]) 04/16/2022   Enrolled in clinical trial: OCEANIC-AF (Asundexian - factor XIa inhibitor PO BID vs Apixaban PO BID in patients with A. Fib for stroke prevention. 03/26/2022   PAF (paroxysmal atrial fibrillation) ([place]). CHA2DS2-VASc Score is 5 (A, HTN, DM Vasc Dz). 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 12/15/2018   Flu-like symptoms 09/04/2018   Troponin level elevated 09/02/2018   CAD (coronary artery disease), native coronary artery 08/30/2018   Essential hypertension    Mixed hyperlipidemia    Angina pectoris ([place]) 02/03/2015   Post PTCA 02/03/2015   Diabetes mellitus ([place]) 02/03/2015   Sick sinus syndrome [facility])    Pacemaker Medtronic dual chamber AzureT XT DR MRI SureScan Gen change 06/17/21 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    PCP: Deland Pretty, MD  REFERRING PROVIDER: Consuella Lose, MD   REFERRING DIAG: [id number] (ICD-10-CM) - Spinal stenosis, cervical region   THERAPY DIAG:  Muscle weakness (generalized)  Cervicalgia  Cramp and spasm  Other abnormalities of gait and mobility  Rationale for Evaluation and Treatment Rehabilitation  ONSET DATE: s/p cervical laminectomy on 04/16/2022  SUBJECTIVE:                                                                                                                                                                                                         SUBJECTIVE STATEMENT: Pt reports  some fatigue, but denies pain.  States that he has OT again immediately following PT session.  PERTINENT HISTORY:  Angina, CAD, Hx of MI  in 2016, Pacemaker placement in 2007, s/p cervical laminectomy 04/16/22, polio as a child  PAIN:  Are you having pain? No  PRECAUTIONS: Cervical, Fall, and ICD/Pacemaker recent cervical laminectomy  WEIGHT BEARING RESTRICTIONS No  FALLS:  Has patient fallen in last 6 months? No  LIVING ENVIRONMENT: Lives with: lives with their spouse Lives in: House/apartment Stairs: Yes: External: 3 steps; can reach both Has following equipment at home: Gilford Rile - 4 wheeled, shower chair, and Grab bars  OCCUPATION: Retired Theatre manager  PLOF: Independent and Leisure: Museum/gallery curator, model train  PATIENT GOALS:  To walk normally again.  OBJECTIVE:   DIAGNOSTIC FINDINGS:  04/16/22 Cervical Radiographs IMPRESSION: Localization film for C3-C4 posterior laminectomy. Surgical instruments overlying the soft tissues posterior to the C2 through C4 spinous processes.  PATIENT SURVEYS:  05/11/2022:  FOTO 51% (projected 60% by visit 12)   COGNITION: Overall cognitive status: Within functional limits for tasks assessed   SENSATION: Reports numbness and tingling in hands and feet  POSTURE:  Right shoulder higher than left with side lean noted  PALPATION: Some muscle spasms noted in cervical region   CERVICAL ROM:   Active ROM A/ROM (deg) eval  Flexion 40  Extension 10  Right lateral flexion 15  Left lateral flexion 10  Right rotation 30  Left rotation 19   (Blank rows = not tested)  UPPER EXTREMITY ROM:  Active ROM Right eval Left eval  Shoulder flexion 126 105  Shoulder extension    Shoulder abduction 115 85  Shoulder adduction    Shoulder internal rotation    Shoulder external rotation    Elbow flexion    Elbow extension    Wrist flexion    Wrist extension    Wrist ulnar deviation    Wrist radial deviation    Wrist pronation     Wrist supination     (Blank rows = not tested)  UPPER EXTREMITY AND LOWER EXTREMITY MMT: 05/11/2022:   Right UE strength of 4-/5, Left UE strength of 3/5 Bilateral LE strength of 4/5 grossly throughout  FUNCTIONAL TESTS:  05/11/2022: 5 times sit to stand: 15.3 sec Timed up and go (TUG): 13.4 sec  05/19/2022: 3 minute walk test:  451 ft without assistive device   TODAY'S TREATMENT:  Date:  06/08/2022 Nustep level 5 x6 min with PT present to discuss status Seated with 4#:  heel/toe raises, marching, LAQ, hip abduction scissors.  2x10 bilat each Ambulation down PT gym hallway and back with 4# on each ankle x2 with seated recovery period between the trials Seated chin tuck and cervical rotation.  2x10 each Seated clamshells with blue tband 2x10 Seated hamstring curls with blue tband 2x10 bilat Seated blue pball rollout 2x10 to encourage shoulder flexion, then scaption/abduction 2x10 bilat Seated horizontal abduction and shoulder ER with yellow tband 2x10 each (with cuing to slow down)   Date:  06/03/2022 Nustep level 4 x6 min with PT present to discuss status Seated with 4#:  heel/toe raises, marching, LAQ, hip abduction scissors.  2x10 bilat each Seated chin tuck and cervical rotation.  2x10 each Seated clamshells with blue tband 2x10 Seated hamstring curls with blue tband 2x10 bilat Supine SLR 2x10, bilat  Supine shoulder flexion with 2# on cane 2x10 Supine chest press with 2# on cane 2x10 Supine horizontal abduction and shoulder ER with yellow tband 2x10 each Seated blue pball rollout 2x10 to encourage shoulder flexion, then scaption/abduction x10 bilat   Date:  06/01/2022 Nustep level 5 x6 min  with PT present to discuss status Seated with 4#:  heel/toe raises, marching, LAQ, hip abduction scissors.  2x10 bilat each Seated clamshells with blue tband 2x10 Seated hamstring curls with blue tband 2x10 bilat Seated green pball rollout 2x10 to encourage shoulder  flexion Seated lumbar/thoracic extension 2x10 Supine shoulder flexion with cane 2x10 Supine left shoulder abduction AA/ROM with cane 2x10 Supine SLR 2x10, bilat  Supine chest press with 2# on cane 2x10 Supine chin tuck 2x10 Sit to/from stand (elevated on purple foam) holding 4# dumbbell x10     PATIENT EDUCATION:  Education details: Issued HEP Person educated: Patient Education method: Explanation, Verbal cues, and Handouts Education comprehension: verbalized understanding and returned demonstration   HOME EXERCISE PROGRAM: Access Code: [email or web address] URL: [email or web address] Date: 05/25/2022 Prepared by: Shelby Dubin Princeston Blizzard  Exercises - Seated Cervical Rotation AROM  - 1 x daily - 7 x weekly - 2 sets - 10 reps - Seated Cervical Extension AROM  - 1 x daily - 7 x weekly - 2 sets - 10 reps - Seated Scapular Retraction  - 1 x daily - 7 x weekly - 2 sets - 10 reps - Seated March  - 1 x daily - 7 x weekly - 2 sets - 10 reps - Seated Long Arc Quad  - 1 x daily - 7 x weekly - 2 sets - 10 reps - Supine Shoulder Flexion with Dowel  - 1 x daily - 7 x weekly - 2 sets - 10 reps - Supine Shoulder Abduction AAROM with Dowel  - 1 x daily - 7 x weekly - 2 sets - 10 reps  ASSESSMENT:  CLINICAL IMPRESSION: Mr Headen presents to skilled PT with reports of feeling fatigue, but states "my neck is feeling better."  Pt reports that his step daughter is going to look into purchasing some ankle weights to assist with HEP.  Pt requires cuing with ambulation with 4# weights on ankles for improved foot clearance and decreased shuffling gait.  Pt continues to require skilled PT to progress towards goal related activities.   OBJECTIVE IMPAIRMENTS decreased activity tolerance, decreased balance, difficulty walking, decreased ROM, decreased strength, increased muscle spasms, impaired flexibility, impaired UE functional use, postural dysfunction, and pain.   ACTIVITY LIMITATIONS carrying, stairs,  transfers, and reach over head  PARTICIPATION LIMITATIONS: cleaning, driving, and community activity  PERSONAL FACTORS Age and 3+ comorbidities: s/p cervical laminectomy on 04/16/22, polio in childhood, s/p Pacemaker placement, DM, Angina  are also affecting patient's functional outcome.   REHAB POTENTIAL: Good  CLINICAL DECISION MAKING: Evolving/moderate complexity  EVALUATION COMPLEXITY: Moderate   GOALS: Goals reviewed with patient? Yes  SHORT TERM GOALS: Target date: 06/01/2022   Pt will be independent with initial HEP. Baseline:  Goal status: MET  2.  Pt will report at least a 30% improvement in symptoms since starting PT. Baseline: Reports 10% improvement on 06/01/22 Goal status: IN PROGRESS   LONG TERM GOALS: Target date: 07/02/2022  Pt will be independent with advanced HEP. Baseline:  Goal status: IN PROGRESS  2.  Pt will increase FOTO to at least 60% to demonstrate improvements in functional mobility. Baseline: 51% Goal status: INITIAL  3.  Pt will increase UE and LE bilateral strength to at least 4+/5 to allow him to more easily perform household tasks. Baseline: see above Goal status: INITIAL  4.  Pt will report ability to ambulate for at least 20 min without any increased pain or loss of balance to allow for community reintegration. Baseline:  Goal status: IN PROGRESS  5.  Pt will increase cervical A/ROM to [facility] to allow him to drive without difficulty or increased pain. Baseline: see above Goal status: INITIAL    PLAN: PT FREQUENCY: 2x/week  PT DURATION: 8 weeks  PLANNED INTERVENTIONS: Therapeutic exercises, Therapeutic activity, Neuromuscular re-education, Balance training, Gait training, Patient/Family education, Self Care, Joint mobilization, Joint manipulation, Stair training, Aquatic Therapy, Dry Needling, Spinal manipulation, Spinal mobilization, Cryotherapy, Moist heat, scar mobilization, Taping, Ultrasound, Ionotophoresis 74m/ml Dexamethasone,  Manual therapy, and Re-evaluation  PLAN FOR NEXT SESSION: progress and assess HEP, strengthening, A/ROM, balance   SJuel Burrow PT 06/08/2022, 10:13 AM   [facility] [place] [address]Phone # [phone] [phone]

## 2022-06-10 ENCOUNTER — Ambulatory Visit: Payer: Medicare HMO | Admitting: Occupational Therapy

## 2022-06-10 ENCOUNTER — Ambulatory Visit: Payer: Medicare HMO | Admitting: Rehabilitative and Restorative Service Providers"

## 2022-06-10 ENCOUNTER — Encounter: Payer: Self-pay | Admitting: Rehabilitative and Restorative Service Providers"

## 2022-06-10 DIAGNOSIS — M6281 Muscle weakness (generalized): Secondary | ICD-10-CM

## 2022-06-10 DIAGNOSIS — R2689 Other abnormalities of gait and mobility: Secondary | ICD-10-CM | POA: Diagnosis not present

## 2022-06-10 DIAGNOSIS — R278 Other lack of coordination: Secondary | ICD-10-CM

## 2022-06-10 DIAGNOSIS — R252 Cramp and spasm: Secondary | ICD-10-CM

## 2022-06-10 DIAGNOSIS — M542 Cervicalgia: Secondary | ICD-10-CM | POA: Diagnosis not present

## 2022-06-10 DIAGNOSIS — R208 Other disturbances of skin sensation: Secondary | ICD-10-CM | POA: Diagnosis not present

## 2022-06-10 NOTE — Therapy (Signed)
OUTPATIENT PHYSICAL THERAPY TREATMENT NOTE   Patient Name: Jesse [name] MRN: [id number] DOB:[birth year], 81 y.o., male Today's Date: 06/10/2022   PT End of Session - 06/10/22 0932     Visit Number 9    Date for PT Re-Evaluation 07/02/22    Authorization Type [employer]    Authorization Time Period 05/11/2022 - 07/02/2022    Authorization - Visit Number 9    Authorization - Number of Visits 16    Progress Note Due on Visit 10    PT Start Time 0929    PT Stop Time 1010    PT Time Calculation (min) 41 min    Activity Tolerance Patient tolerated treatment well    Behavior During Therapy [facility] for tasks assessed/performed              Past Medical History:  Diagnosis Date   Angina pectoris ([place]) 02/03/2015   CAD (coronary artery disease), native coronary artery 08/30/2018   2007 [place], [place]: LIMA to LAD, Occluded SVG to RCA and OM by angiogram S/P  PCI/orbital atherectomy S/P 2.5x15 Resoluute to prox Cx on 11/29/2017.   Coronary artery disease    High cholesterol    Hypertension    Myocardial infarction [facility]) 2017/2018?   NSTEMI (non-ST elevated myocardial infarction) ([place]) 02/03/2015   Presence of permanent cardiac pacemaker    Medtronic   Type II diabetes mellitus ([place])    Past Surgical History:  Procedure Laterality Date   CARDIAC CATHETERIZATION N/A 02/04/2015   Procedure: Left Heart Cath and Cors/Grafts Angiography;  Surgeon: Adrian Prows, MD;  Location: [place] [facility];  Service: Cardiovascular;  Laterality: N/A;   CARDIAC CATHETERIZATION N/A 02/04/2015   Procedure: Coronary Stent Intervention;  Surgeon: Adrian Prows, MD;  Location: [place] [facility];  Service: Cardiovascular;  Laterality: N/A;   COLONOSCOPY     several   CORONARY ANGIOGRAPHY N/A 10/18/2017   Procedure: CORONARY ANGIOGRAPHY;  Surgeon: Adrian Prows, MD;  Location: [place] [facility];  Service: Cardiovascular;  Laterality: N/A;   CORONARY ANGIOPLASTY     CORONARY ANGIOPLASTY WITH STENT PLACEMENT      "i've got 5-6 stents total" (08/30/2017)   CORONARY ARTERY BYPASS GRAFT  2007   CABG X3   CORONARY ATHERECTOMY N/A 10/18/2017   Procedure: CORONARY ATHERECTOMY;  Surgeon: Adrian Prows, MD;  Location: [place] [facility];  Service: Cardiovascular;  Laterality: N/A;   CORONARY ATHERECTOMY N/A 11/29/2017   Procedure: CORONARY ATHERECTOMY;  Surgeon: Adrian Prows, MD;  Location: [place] [facility];  Service: Cardiovascular;  Laterality: N/A;   CORONARY CTO INTERVENTION N/A 08/30/2017   Procedure: CORONARY CTO INTERVENTION;  Surgeon: Adrian Prows, MD;  Location: [place] [facility];  Service: Cardiovascular;  Laterality: N/A;   CORONARY STENT INTERVENTION N/A 11/29/2017   Procedure: CORONARY STENT INTERVENTION;  Surgeon: Adrian Prows, MD;  Location: [place] [facility];  Service: Cardiovascular;  Laterality: N/A;   INSERT / REPLACE / REMOVE PACEMAKER  ?2012   Now currently has Medtronic Pacemaker.  "I think it was placed in 2012"   KNEE ARTHROSCOPY Left 04/03/2020   Procedure: LEFT KNEE ARTHROSCOPY  WASHOUT;  Surgeon: Tania Ade, MD;  Location: [facility];  Service: Orthopedics;  Laterality: Left;   LEFT HEART CATH AND CORONARY ANGIOGRAPHY N/A 08/30/2017   Procedure: LEFT HEART CATH AND CORONARY ANGIOGRAPHY;  Surgeon: Adrian Prows, MD;  Location: [place] [facility];  Service: Cardiovascular;  Laterality: N/A;   LEFT HEART CATH AND CORS/GRAFTS ANGIOGRAPHY N/A 08/05/2017  Procedure: LEFT HEART CATH AND CORS/GRAFTS ANGIOGRAPHY;  Surgeon: Adrian Prows, MD;  Location: [place] [facility];  Service: Cardiovascular;  Laterality: N/A;   LEFT HEART CATH AND CORS/GRAFTS ANGIOGRAPHY N/A 06/30/2021   Procedure: LEFT HEART CATH AND CORS/GRAFTS ANGIOGRAPHY;  Surgeon: Adrian Prows, MD;  Location: [place] [facility];  Service: Cardiovascular;  Laterality: N/A;   POSTERIOR CERVICAL LAMINECTOMY N/A 04/16/2022   Procedure: POSTERIOR CERVICAL LAMINECTOMY, CERVICAL THREE-CERVICAL FOUR;  Surgeon: Consuella Lose, MD;  Location: [place];  Service: Neurosurgery;  Laterality: N/A;  3C   PPM GENERATOR CHANGEOUT N/A 06/17/2021   Procedure: PPM GENERATOR CHANGEOUT;  Surgeon: Evans Lance, MD;  Location: [place] [facility];  Service: Cardiovascular;  Laterality: N/A;   Patient Active Problem List   Diagnosis Date Noted   Stenosis of cervical spine with myelopathy ([place]) 04/16/2022   Enrolled in clinical trial: OCEANIC-AF (Asundexian - factor XIa inhibitor PO BID vs Apixaban PO BID in patients with A. Fib for stroke prevention. 03/26/2022   PAF (paroxysmal atrial fibrillation) ([place]). CHA2DS2-VASc Score is 5 (A, HTN, DM Vasc Dz). 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 12/15/2018   Flu-like symptoms 09/04/2018   Troponin level elevated 09/02/2018   CAD (coronary artery disease), native coronary artery 08/30/2018   Essential hypertension    Mixed hyperlipidemia    Angina pectoris ([place]) 02/03/2015   Post PTCA 02/03/2015   Diabetes mellitus ([place]) 02/03/2015   Sick sinus syndrome [facility])    Pacemaker Medtronic dual chamber AzureT XT DR MRI SureScan Gen change 06/17/21 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    PCP: Deland Pretty, MD  REFERRING PROVIDER: Consuella Lose, MD   REFERRING DIAG: [phone] (ICD-10-CM) - Spinal stenosis, cervical region   THERAPY DIAG:  Cervicalgia  Muscle weakness (generalized)  Cramp and spasm  Other abnormalities of gait and mobility  Rationale for Evaluation and Treatment Rehabilitation  ONSET DATE: s/p cervical laminectomy on 04/16/2022  SUBJECTIVE:                                                                                                                                                                                                         SUBJECTIVE STATEMENT: Pt reports  that he has made at least 20% improvements since initial evaluation  PERTINENT HISTORY:  Angina, CAD, Hx of MI in 2016, Pacemaker placement in  2007, s/p cervical laminectomy 04/16/22, polio as a child  PAIN:  Are you having pain? No  PRECAUTIONS: Cervical, Fall, and ICD/Pacemaker recent cervical laminectomy  WEIGHT BEARING RESTRICTIONS No  FALLS:  Has patient fallen in last 6 months? No  LIVING ENVIRONMENT: Lives with: lives with their spouse Lives in: House/apartment Stairs: Yes: External: 3 steps; can reach both Has following equipment at home: Gilford Rile - 4 wheeled, shower chair, and Grab bars  OCCUPATION: Retired Theatre manager  PLOF: Independent and Leisure: Museum/gallery curator, model train  PATIENT GOALS:  To walk normally again.  OBJECTIVE:   DIAGNOSTIC FINDINGS:  04/16/22 Cervical Radiographs IMPRESSION: Localization film for C3-C4 posterior laminectomy. Surgical instruments overlying the soft tissues posterior to the C2 through C4 spinous processes.  PATIENT SURVEYS:  05/11/2022:  FOTO 51% (projected 60% by visit 12)   COGNITION: Overall cognitive status: Within functional limits for tasks assessed   SENSATION: Reports numbness and tingling in hands and feet  POSTURE:  Right shoulder higher than left with side lean noted  PALPATION: Some muscle spasms noted in cervical region   CERVICAL ROM:   Active ROM A/ROM (deg) eval  Flexion 40  Extension 10  Right lateral flexion 15  Left lateral flexion 10  Right rotation 30  Left rotation 19   (Blank rows = not tested)  UPPER EXTREMITY ROM:  Eval:   Shoulder Flexion: Right 126 deg, left 105 deg Shoulder Abduction:  right 115 deg, left 85 deg  06/10/2022: Shoulder Flexion: Right 126 deg, left 105 deg Shoulder Abduction:  right 120 deg, left 85 deg    UPPER EXTREMITY AND LOWER EXTREMITY MMT: 05/11/2022:   Right UE strength of 4-/5, Left UE strength of 3/5 Bilateral LE strength of 4/5 grossly  throughout  FUNCTIONAL TESTS:  05/11/2022: 5 times sit to stand: 15.3 sec Timed up and go (TUG): 13.4 sec  05/19/2022: 3 minute walk test:  451 ft without assistive device   TODAY'S TREATMENT:  Date:  06/10/2022 Nustep level 5 x6 min with PT present to discuss status Seated with 4#:  heel/toe raises, marching, LAQ, hip abduction scissors.  2x10 bilat each Ambulation down PT gym hallway and back with 4# on each ankle x2 with seated recovery period between the trials Seated chin tuck and cervical rotation.  2x10 each Seated clamshells with blue tband 2x10 Seated hamstring curls with blue tband 2x10 bilat Seated blue pball rollout 2x10 to encourage shoulder flexion, then scaption/abduction 2x10 bilat Seated horizontal abduction and shoulder ER with yellow tband 2x10 each (with cuing to slow down)   Date:  06/08/2022 Nustep level 5 x6 min with PT present to discuss status Seated with 4#:  heel/toe raises, marching, LAQ, hip abduction scissors.  2x10 bilat each Ambulation down PT gym hallway and back with 4# on each ankle x2 with seated recovery period between the trials Seated chin tuck and cervical rotation.  2x10 each Seated clamshells with blue tband 2x10 Seated hamstring curls with blue tband 2x10 bilat Seated blue pball rollout 2x10 to encourage shoulder flexion, then scaption/abduction 2x10 bilat Seated horizontal abduction and shoulder ER with yellow tband 2x10 each (with cuing to slow down)   Date:  06/03/2022 Nustep level 4 x6 min with PT present to discuss status Seated with 4#:  heel/toe raises, marching, LAQ, hip abduction scissors.  2x10 bilat each Seated chin tuck and cervical rotation.  2x10 each Seated clamshells with blue tband 2x10 Seated hamstring curls with blue tband 2x10 bilat Supine SLR 2x10,  bilat  Supine shoulder flexion with 2# on cane 2x10 Supine chest press with 2# on cane 2x10 Supine horizontal abduction and shoulder ER with yellow tband 2x10  each Seated blue pball rollout 2x10 to encourage shoulder flexion, then scaption/abduction x10 bilat    PATIENT EDUCATION:  Education details: Issued HEP Person educated: Patient Education method: Consulting civil engineer, Verbal cues, and Handouts Education comprehension: verbalized understanding and returned demonstration   HOME EXERCISE PROGRAM: Access Code: [email or web address] URL: [email or web address] Date: 05/25/2022 Prepared by: Shelby Dubin Jobeth Pangilinan  Exercises - Seated Cervical Rotation AROM  - 1 x daily - 7 x weekly - 2 sets - 10 reps - Seated Cervical Extension AROM  - 1 x daily - 7 x weekly - 2 sets - 10 reps - Seated Scapular Retraction  - 1 x daily - 7 x weekly - 2 sets - 10 reps - Seated March  - 1 x daily - 7 x weekly - 2 sets - 10 reps - Seated Long Arc Quad  - 1 x daily - 7 x weekly - 2 sets - 10 reps - Supine Shoulder Flexion with Dowel  - 1 x daily - 7 x weekly - 2 sets - 10 reps - Supine Shoulder Abduction AAROM with Dowel  - 1 x daily - 7 x weekly - 2 sets - 10 reps  ASSESSMENT:  CLINICAL IMPRESSION: Mr Taft presents to skilled PT with him stating that he is able to reach some higher buttons easier than he was prior to starting PT.  Pt continues to progress with strengthening and has made some progress with improved right shoulder abduction.  Pt continues to progress in PT sessions with strength and ROM.  Pt reported that ambulation with 4# on his ankles was easier today than it was last session. Pt continues to require skilled PT to progress towards goal related activities.   OBJECTIVE IMPAIRMENTS decreased activity tolerance, decreased balance, difficulty walking, decreased ROM, decreased strength, increased muscle spasms, impaired flexibility, impaired UE functional use, postural dysfunction, and pain.   ACTIVITY LIMITATIONS carrying, stairs, transfers, and reach over head  PARTICIPATION LIMITATIONS: cleaning, driving, and community activity  PERSONAL FACTORS Age and  3+ comorbidities: s/p cervical laminectomy on 04/16/22, polio in childhood, s/p Pacemaker placement, DM, Angina  are also affecting patient's functional outcome.   REHAB POTENTIAL: Good  CLINICAL DECISION MAKING: Evolving/moderate complexity  EVALUATION COMPLEXITY: Moderate   GOALS: Goals reviewed with patient? Yes  SHORT TERM GOALS: Target date: 06/01/2022   Pt will be independent with initial HEP. Baseline:  Goal status: MET  2.  Pt will report at least a 30% improvement in symptoms since starting PT. Baseline: Reports 20% improvement on 06/10/22 Goal status: IN PROGRESS   LONG TERM GOALS: Target date: 07/02/2022  Pt will be independent with advanced HEP. Baseline:  Goal status: IN PROGRESS  2.  Pt will increase FOTO to at least 60% to demonstrate improvements in functional mobility. Baseline: 51% Goal status: INITIAL  3.  Pt will increase UE and LE bilateral strength to at least 4+/5 to allow him to more easily perform household tasks. Baseline: see above Goal status: INITIAL  4.  Pt will report ability to ambulate for at least 20 min without any increased pain or loss of balance to allow for community reintegration. Baseline:  Goal status: IN PROGRESS  5.  Pt will increase cervical A/ROM to [facility] to allow him to drive without difficulty or increased pain. Baseline: see above Goal status: INITIAL    PLAN:  PT FREQUENCY: 2x/week  PT DURATION: 8 weeks  PLANNED INTERVENTIONS: Therapeutic exercises, Therapeutic activity, Neuromuscular re-education, Balance training, Gait training, Patient/Family education, Self Care, Joint mobilization, Joint manipulation, Stair training, Aquatic Therapy, Dry Needling, Spinal manipulation, Spinal mobilization, Cryotherapy, Moist heat, scar mobilization, Taping, Ultrasound, Ionotophoresis 22m/ml Dexamethasone, Manual therapy, and Re-evaluation  PLAN FOR NEXT SESSION: progress and assess HEP, strengthening, A/ROM, balance   SJuel Burrow PT 06/10/2022, 10:16 AM   [facility] [place] [id number] # [phone] [phone]

## 2022-06-10 NOTE — Therapy (Signed)
OUTPATIENT OCCUPATIONAL THERAPY   Treatment Note  Patient Name: Jesse [name] MRN: [id number] DOB:Sep 15, 1940, 81 y.o., male Today's Date: 06/10/2022  PCP: Deland Pretty, MD REFERRING PROVIDER: Consuella Lose, MD    OT End of Session - 06/10/22 1251     Visit Number 5    Number of Visits 15    Date for OT Re-Evaluation 07/16/22    Authorization Type [employer]    OT Start Time 1022    OT Stop Time 7829    OT Time Calculation (min) 40 min                 Past Medical History:  Diagnosis Date   Angina pectoris ([place]) 02/03/2015   CAD (coronary artery disease), native coronary artery 08/30/2018   2007 [place], [place]: LIMA to LAD, Occluded SVG to RCA and OM by angiogram S/P  PCI/orbital atherectomy S/P 2.5x15 Resoluute to prox Cx on 11/29/2017.   Coronary artery disease    High cholesterol    Hypertension    Myocardial infarction [facility]) 2017/2018?   NSTEMI (non-ST elevated myocardial infarction) ([place]) 02/03/2015   Presence of permanent cardiac pacemaker    Medtronic   Type II diabetes mellitus ([place])    Past Surgical History:  Procedure Laterality Date   CARDIAC CATHETERIZATION N/A 02/04/2015   Procedure: Left Heart Cath and Cors/Grafts Angiography;  Surgeon: Adrian Prows, MD;  Location: [place] [facility];  Service: Cardiovascular;  Laterality: N/A;   CARDIAC CATHETERIZATION N/A 02/04/2015   Procedure: Coronary Stent Intervention;  Surgeon: Adrian Prows, MD;  Location: [place] [facility];  Service: Cardiovascular;  Laterality: N/A;   COLONOSCOPY     several   CORONARY ANGIOGRAPHY N/A 10/18/2017   Procedure: CORONARY ANGIOGRAPHY;  Surgeon: Adrian Prows, MD;  Location: [place] [facility];  Service: Cardiovascular;  Laterality: N/A;   CORONARY ANGIOPLASTY     CORONARY ANGIOPLASTY WITH STENT PLACEMENT     "i've got 5-6 stents total" (08/30/2017)   CORONARY ARTERY BYPASS GRAFT  2007   CABG X3   CORONARY ATHERECTOMY N/A 10/18/2017   Procedure: CORONARY ATHERECTOMY;   Surgeon: Adrian Prows, MD;  Location: [place] [facility];  Service: Cardiovascular;  Laterality: N/A;   CORONARY ATHERECTOMY N/A 11/29/2017   Procedure: CORONARY ATHERECTOMY;  Surgeon: Adrian Prows, MD;  Location: [place] [facility];  Service: Cardiovascular;  Laterality: N/A;   CORONARY CTO INTERVENTION N/A 08/30/2017   Procedure: CORONARY CTO INTERVENTION;  Surgeon: Adrian Prows, MD;  Location: [place] [facility];  Service: Cardiovascular;  Laterality: N/A;   CORONARY STENT INTERVENTION N/A 11/29/2017   Procedure: CORONARY STENT INTERVENTION;  Surgeon: Adrian Prows, MD;  Location: [place] [facility];  Service: Cardiovascular;  Laterality: N/A;   INSERT / REPLACE / REMOVE PACEMAKER  ?2012   Now currently has Medtronic Pacemaker.  "I think it was placed in 2012"   KNEE ARTHROSCOPY Left 04/03/2020   Procedure: LEFT KNEE ARTHROSCOPY  WASHOUT;  Surgeon: Tania Ade, MD;  Location: [facility];  Service: Orthopedics;  Laterality: Left;   LEFT HEART CATH AND CORONARY ANGIOGRAPHY N/A 08/30/2017   Procedure: LEFT HEART CATH AND CORONARY ANGIOGRAPHY;  Surgeon: Adrian Prows, MD;  Location: [place] [facility];  Service: Cardiovascular;  Laterality: N/A;   LEFT HEART CATH AND CORS/GRAFTS ANGIOGRAPHY N/A 08/05/2017   Procedure: LEFT HEART CATH AND CORS/GRAFTS ANGIOGRAPHY;  Surgeon: Adrian Prows, MD;  Location: [place] [facility];  Service: Cardiovascular;  Laterality: N/A;   LEFT HEART CATH AND CORS/GRAFTS ANGIOGRAPHY  N/A 06/30/2021   Procedure: LEFT HEART CATH AND CORS/GRAFTS ANGIOGRAPHY;  Surgeon: Adrian Prows, MD;  Location: [place] [facility];  Service: Cardiovascular;  Laterality: N/A;   POSTERIOR CERVICAL LAMINECTOMY N/A 04/16/2022   Procedure: POSTERIOR CERVICAL LAMINECTOMY, CERVICAL THREE-CERVICAL FOUR;  Surgeon: Consuella Lose, MD;  Location: [facility];  Service: Neurosurgery;  Laterality: N/A;  3C   PPM GENERATOR CHANGEOUT N/A 06/17/2021   Procedure: PPM GENERATOR CHANGEOUT;  Surgeon: Evans Lance, MD;   Location: [place] [facility];  Service: Cardiovascular;  Laterality: N/A;   Patient Active Problem List   Diagnosis Date Noted   Stenosis of cervical spine with myelopathy ([place]) 04/16/2022   Enrolled in clinical trial: OCEANIC-AF (Asundexian - factor XIa inhibitor PO BID vs Apixaban PO BID in patients with A. Fib for stroke prevention. 03/26/2022   PAF (paroxysmal atrial fibrillation) ([place]). CHA2DS2-VASc Score is 5 (A, HTN, DM Vasc Dz). 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 12/15/2018   Flu-like symptoms 09/04/2018   Troponin level elevated 09/02/2018   CAD (coronary artery disease), native coronary artery 08/30/2018   Essential hypertension    Mixed hyperlipidemia    Angina pectoris ([place]) 02/03/2015   Post PTCA 02/03/2015   Diabetes mellitus ([place]) 02/03/2015   Sick sinus syndrome ([place])    Pacemaker Medtronic dual chamber AzureT XT DR MRI SureScan Gen change 06/17/21 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 Pt accompanied by: self  PERTINENT HISTORY: Angina, CAD, Hx of MI in 2016, Pacemaker placement in 2007, s/p cervical laminectomy 04/16/22, polio as a child  PRECAUTIONS: Cervical, Fall, and ICD/Pacemaker - recent cervical laminectomy  WEIGHT BEARING RESTRICTIONS: No  PAIN:  Are you having pain? No  FALLS: Has patient fallen in last 6 months? No   PATIENT GOALS: to have better use of hands  OBJECTIVE:    HAND DOMINANCE: Ambidextrous, learned to use left hand as child as his RUE was effected by polio  FUNCTIONAL OUTCOME MEASURES: Quick Dash: 31.8  UPPER EXTREMITY ROM: Pt reports polio impacted L upper arm more than R upper arm, but R hand impacted significantly more and with no L hand impairments from polio.  Active ROM Right eval Left eval  Shoulder flexion WFL   82  Shoulder abduction    Shoulder adduction 115 78  Shoulder extension    Shoulder internal rotation WFL 90%  Shoulder external rotation WFL 90%  Elbow flexion [facility] WFL  Elbow extension [facility] WFL  Wrist flexion    Wrist extension    Wrist ulnar deviation    Wrist radial deviation    Wrist pronation    Wrist supination    (Blank rows = not tested)  UPPER EXTREMITY MMT:     MMT Right eval Left eval  Shoulder flexion 3+/5 3-/5   Shoulder abduction    Shoulder adduction    Shoulder extension    Shoulder internal rotation    Shoulder external rotation    Middle  trapezius    Lower trapezius    Elbow flexion 4-/5 3/5  Elbow extension 4-/5 3-/5  Wrist flexion    Wrist extension    Wrist ulnar deviation    Wrist radial deviation    Wrist pronation    Wrist supination    (Blank rows = not tested)  HAND FUNCTION: Grip strength: Right: 10 lbs; Left: 45 lbs, Lateral pinch: Right: 2 lbs, Left: 12 lbs, and 3 point pinch: Right: 0 lbs, Left: 8 lbs  COORDINATION: Finger Nose Finger test: moderate dysmetria on R, minimal dysmetria on L (completed half as many on R as L in same time frame) 9 Hole Peg test: Right: 1:29.34 (utilized LUE to pick up and place pegs in to R hand d/t polio and removed pegs by grasping between 3-4 digit) sec; Left: 1:25.69 sec Box and Blocks:  Right 31blocks, Left 41blocks  SENSATION: Reports numbness and tingling in hands and feet     TODAY'S TREATMENT:  06/10/22 9 hole peg test: Left: 1:08.4 on 06/10/22 and Right: 1:45.13 however not utilizing LUE in picking up pegs as when  completed on eval - therefore improved mobility and coordination on R as well as L.  Pt pleased with progression bilaterally. Handwriting: Pt utilizes RUE for handwriting, placing peg in between 3rd and 4th fingers to stabilize.  Pt demonstrating good legibility and motor control, due to compensatory strategies learned s/p polio. Therapeutic exercises: AAROM: closed -chain activity with 2# dowel completing shoulder flexion to 90* and bicep curls, Complete 2x10.  OT providing min cues for technique and visual attention to LUE for improved symmetry.  Noted decreased shoulder flexion on L as pt fatigued, due to increased weight/resistance.  Completed 2 x10 chest presses with 1.5# dowel, still with difficulty but improved from 2# dowel.  Engaged in lateral row from sit > side with 1.5# with focus on shoulder internal/external rotation.      06/08/22 Coordination: engaged in isolated finger exercises with abduction/adduction, finger extension, gross grasp, and thumb opposition. Pt able to modify tasks as needed to accommodate for impairments s/p polio.  Engaged in picking up variety of small items one at a time and placing in container.  Pt utilizing LUE as primary UE due to impairments in RUE s/p polio but able to utilize RUE as stabilizer to gross assist as needed.  Progressed to in-hand manipulation and translation with 5 small beads and beans from finger tip > palm > finger tips to place into container.  Pt demonstrating mod difficulty with beans but able to complete with increased time and effort. Stacking checker pieces and flipping/dealing cards with focus on precision pinch and motor control.  Pt needing to slide cards towards edge of table to facilitate increased coordination to flip/rotate cards.   Resistance Clothespins: 2# with LUE for low functional reaching and sustained pinch. Pt initially completing with gross grasp, therefore modified task to facilitate increased pinch.  OT providing min cues for  movement pattern and technique.  Pt completing task with lateral pinch, when attempted to complete with 3 jaw chuck pt unable to achieve position and maintain against resistance of 1 or 2# clothespins.  Discussed attempting 3 jaw chuck and precision pinch in massed practice at home.   06/01/22 Therapeutic exercises: BUE snow angels - pt limited to 80-90* shoulder flexion in supine.  Attempted open book stretch in sidelying on L shoulder, however pt with increased pain in B shoulders, therefore terminated task.  AAROM: closed -chain activity with dowel  completing shoulder flexion to 90* and chest press. OT providing min cues for technique and visual attention to LUE for improved symmetry.  Completed each exercise 2x10. Theraputty: engaged in [place] task with rolling putty in to ball, flattening into pancake, and then removing marbles from putty.  Pt incorporating RUE as gross assist (due to impairments s/p polio) but able to stabilize and maintain gross grasp on putty during all tasks.      PATIENT EDUCATION: Education details: HEP for shoulder ROM/strengthening Person educated: Patient Education method: Explanation, Demonstration, Verbal cues, and Handouts Education comprehension: verbalized understanding  HOME EXERCISE PROGRAM: Theraputty exercises and [facility] Handout  Access Code: [email or web address] URL: [email or web address] Date: 06/01/2022 Prepared by: [facility]  Exercises - Seated Shoulder Flexion with Dowel to 90  - 1 x daily - 2 sets - 10 reps - Seated Dowel Chest Press  - 1 x daily - 2 sets - 10 reps - [place]ours  - 1 x daily - 2 sets - 10 reps   GOALS: Goals reviewed with patient? Yes  SHORT TERM GOALS: Target date: 06/18/22  Pt will be independent with HEP for fine and gross motor coordination and strengthening. Baseline: Goal status: IN PROGRESS  2.  Pt will verbalize understanding of task modifications and/or potential AE needs  to increase ease, safety, and independence w/ ADLs. Baseline:  Goal status: IN PROGRESS   LONG TERM GOALS: Target date: 07/16/22  Pt will demonstrate improved L shoulder flexion to retrieve light weight object from cabinet at moderate height with use of BUE as needed. Baseline:  Goal status: IN PROGRESS  2.  Pt will demonstrate improved fine motor coordination for ADLs as evidenced by decreasing 9 hole peg test score for LUE by 10 sec and RUE by 5 secs Baseline: 9 Hole Peg test: Right: 1:29.34 (utilized LUE to pick up and place pegs in to R hand d/t polio and removed pegs by grasping between 3-4 digit) sec; Left: 1:25.69 sec Goal status: IN PROGRESS - Left: 1:08.4 on 06/10/22 and Right: 1:45.13 however not utilizing LUE in picking up pegs  3.  Pt will demonstrate improved ability to open containers and cut food with use of AE as needed. Baseline:  Goal status: IN PROGRESS  4.  Pt will report improved functional use of BUE as demonstrated by improved score on QuickDash to </= to 25 Baseline: 31.8 Goal status: IN PROGRESS   ASSESSMENT:  CLINICAL IMPRESSION: Treatment session with focus on coordination with LUE / RUE as well as strengthening exercises.  Pt demonstrating good use of RUE during 9 hole peg test and handwriting this session.  Pt reports and demonstrates modifications and adaptive techniques as learned s/p polio as a child.  Pt demonstrating improvements in coordination bilaterally with 9 hole peg test this session.  Pt tolerating increased weight of 1.5# as 2# dowel too heavy at this time with shoulder ROM exercises.  PERFORMANCE DEFICITS: in functional skills including ADLs, IADLs, coordination, dexterity, sensation, ROM, strength, pain, flexibility, Fine motor control, Gross motor control, balance, body mechanics, decreased knowledge of use of DME, and UE functional use and psychosocial skills including environmental adaptation.   IMPAIRMENTS: are limiting patient from ADLs  and IADLs.   CO-MORBIDITIES; may have co-morbidities  that affects occupational performance. Patient will benefit from skilled OT to address above impairments and improve overall function.  MODIFICATION OR ASSISTANCE TO COMPLETE EVALUATION: Min-Moderate modification of tasks or assist with assess necessary to complete  an evaluation.  OT OCCUPATIONAL PROFILE AND HISTORY: Detailed assessment: Review of records and additional review of physical, cognitive, psychosocial history related to current functional performance.  CLINICAL DECISION MAKING: Moderate - several treatment options, min-mod task modification necessary  REHAB POTENTIAL: Good  EVALUATION COMPLEXITY: Moderate    PLAN:  OT FREQUENCY: 1-2x/week  OT DURATION: 8 weeks  PLANNED INTERVENTIONS: self care/ADL training, therapeutic exercise, therapeutic activity, neuromuscular re-education, manual therapy, passive range of motion, functional mobility training, splinting, moist heat, cryotherapy, patient/family education, psychosocial skills training, energy conservation, coping strategies training, and DME and/or AE instructions  RECOMMENDED OTHER SERVICES: NA  CONSULTED AND AGREED WITH PLAN OF CARE: Patient  PLAN FOR NEXT SESSION: Review coordination and strengthening HEP (both hands and L shoulder).  Focus on [place]  and ROM.   Simonne Come, OTR/L 06/10/2022, 12:52 PM

## 2022-06-14 ENCOUNTER — Ambulatory Visit: Payer: Medicare HMO | Admitting: Rehabilitative and Restorative Service Providers"

## 2022-06-14 ENCOUNTER — Ambulatory Visit: Payer: Medicare HMO | Admitting: Occupational Therapy

## 2022-06-14 ENCOUNTER — Encounter: Payer: Self-pay | Admitting: Rehabilitative and Restorative Service Providers"

## 2022-06-14 DIAGNOSIS — R208 Other disturbances of skin sensation: Secondary | ICD-10-CM

## 2022-06-14 DIAGNOSIS — M6281 Muscle weakness (generalized): Secondary | ICD-10-CM

## 2022-06-14 DIAGNOSIS — R278 Other lack of coordination: Secondary | ICD-10-CM

## 2022-06-14 DIAGNOSIS — R2689 Other abnormalities of gait and mobility: Secondary | ICD-10-CM

## 2022-06-14 DIAGNOSIS — R252 Cramp and spasm: Secondary | ICD-10-CM | POA: Diagnosis not present

## 2022-06-14 DIAGNOSIS — M542 Cervicalgia: Secondary | ICD-10-CM

## 2022-06-14 NOTE — Therapy (Signed)
OUTPATIENT PHYSICAL THERAPY TREATMENT NOTE   Patient Name: Jesse [name] MRN: [id number] DOB:January 30, 1941, 81 y.o., male Today's Date: 06/14/2022    Progress Note Reporting Period 05/11/2022 to 06/14/2022  See note below for Objective Data and Assessment of Progress/Goals.        PT End of Session - 06/14/22 0936     Visit Number 10    Date for PT Re-Evaluation 07/02/22    Authorization Type [employer]    Authorization Time Period 05/11/2022 - 07/02/2022    Authorization - Visit Number 10    Authorization - Number of Visits 16    Progress Note Due on Visit 20    PT Start Time 0930    PT Stop Time 1010    PT Time Calculation (min) 40 min    Activity Tolerance Patient tolerated treatment well    Behavior During Therapy [facility] for tasks assessed/performed              Past Medical History:  Diagnosis Date   Angina pectoris ([place]) 02/03/2015   CAD (coronary artery disease), native coronary artery 08/30/2018   2007 [place], [place]: LIMA to LAD, Occluded SVG to RCA and OM by angiogram S/P  PCI/orbital atherectomy S/P 2.5x15 Resoluute to prox Cx on 11/29/2017.   Coronary artery disease    High cholesterol    Hypertension    Myocardial infarction [employer]) 2017/2018?   NSTEMI (non-ST elevated myocardial infarction) ([place]) 02/03/2015   Presence of permanent cardiac pacemaker    Medtronic   Type II diabetes mellitus ([place])    Past Surgical History:  Procedure Laterality Date   CARDIAC CATHETERIZATION N/A 02/04/2015   Procedure: Left Heart Cath and Cors/Grafts Angiography;  Surgeon: Adrian Prows, MD;  Location: [place] [facility];  Service: Cardiovascular;  Laterality: N/A;   CARDIAC CATHETERIZATION N/A 02/04/2015   Procedure: Coronary Stent Intervention;  Surgeon: Adrian Prows, MD;  Location: [place] [facility];  Service: Cardiovascular;  Laterality: N/A;   COLONOSCOPY     several   CORONARY ANGIOGRAPHY N/A 10/18/2017   Procedure: CORONARY ANGIOGRAPHY;  Surgeon: Adrian Prows, MD;   Location: [place] [facility];  Service: Cardiovascular;  Laterality: N/A;   CORONARY ANGIOPLASTY     CORONARY ANGIOPLASTY WITH STENT PLACEMENT     "i've got 5-6 stents total" (08/30/2017)   CORONARY ARTERY BYPASS GRAFT  2007   CABG X3   CORONARY ATHERECTOMY N/A 10/18/2017   Procedure: CORONARY ATHERECTOMY;  Surgeon: Adrian Prows, MD;  Location: [place] [facility];  Service: Cardiovascular;  Laterality: N/A;   CORONARY ATHERECTOMY N/A 11/29/2017   Procedure: CORONARY ATHERECTOMY;  Surgeon: Adrian Prows, MD;  Location: [place] [facility];  Service: Cardiovascular;  Laterality: N/A;   CORONARY CTO INTERVENTION N/A 08/30/2017   Procedure: CORONARY CTO INTERVENTION;  Surgeon: Adrian Prows, MD;  Location: [place] [facility];  Service: Cardiovascular;  Laterality: N/A;   CORONARY STENT INTERVENTION N/A 11/29/2017   Procedure: CORONARY STENT INTERVENTION;  Surgeon: Adrian Prows, MD;  Location: [place] [facility];  Service: Cardiovascular;  Laterality: N/A;   INSERT / REPLACE / REMOVE PACEMAKER  ?2012   Now currently has Medtronic Pacemaker.  "I think it was placed in 2012"   KNEE ARTHROSCOPY Left 04/03/2020   Procedure: LEFT KNEE ARTHROSCOPY  WASHOUT;  Surgeon: Tania Ade, MD;  Location: [facility];  Service: Orthopedics;  Laterality: Left;   LEFT HEART CATH AND CORONARY ANGIOGRAPHY N/A 08/30/2017   Procedure: LEFT HEART CATH AND CORONARY ANGIOGRAPHY;  Surgeon: Einar Gip,  Ulice Dash, MD;  Location: [place] [facility];  Service: Cardiovascular;  Laterality: N/A;   LEFT HEART CATH AND CORS/GRAFTS ANGIOGRAPHY N/A 08/05/2017   Procedure: LEFT HEART CATH AND CORS/GRAFTS ANGIOGRAPHY;  Surgeon: Adrian Prows, MD;  Location: [place] [facility];  Service: Cardiovascular;  Laterality: N/A;   LEFT HEART CATH AND CORS/GRAFTS ANGIOGRAPHY N/A 06/30/2021   Procedure: LEFT HEART CATH AND CORS/GRAFTS ANGIOGRAPHY;  Surgeon: Adrian Prows, MD;  Location: [place] [facility];  Service: Cardiovascular;  Laterality: N/A;   POSTERIOR CERVICAL  LAMINECTOMY N/A 04/16/2022   Procedure: POSTERIOR CERVICAL LAMINECTOMY, CERVICAL THREE-CERVICAL FOUR;  Surgeon: Consuella Lose, MD;  Location: [place];  Service: Neurosurgery;  Laterality: N/A;  3C   PPM GENERATOR CHANGEOUT N/A 06/17/2021   Procedure: PPM GENERATOR CHANGEOUT;  Surgeon: Evans Lance, MD;  Location: [place] [facility];  Service: Cardiovascular;  Laterality: N/A;   Patient Active Problem List   Diagnosis Date Noted   Stenosis of cervical spine with myelopathy ([place]) 04/16/2022   Enrolled in clinical trial: OCEANIC-AF (Asundexian - factor XIa inhibitor PO BID vs Apixaban PO BID in patients with A. Fib for stroke prevention. 03/26/2022   PAF (paroxysmal atrial fibrillation) ([place]). CHA2DS2-VASc Score is 5 (A, HTN, DM Vasc Dz). 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 12/15/2018   Flu-like symptoms 09/04/2018   Troponin level elevated 09/02/2018   CAD (coronary artery disease), native coronary artery 08/30/2018   Essential hypertension    Mixed hyperlipidemia    Angina pectoris ([place]) 02/03/2015   Post PTCA 02/03/2015   Diabetes mellitus ([place]) 02/03/2015   Sick sinus syndrome [facility])    Pacemaker Medtronic dual chamber AzureT XT DR MRI SureScan Gen change 06/17/21 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    PCP: Deland Pretty, MD  REFERRING PROVIDER: Consuella Lose, MD   REFERRING DIAG: [phone] (ICD-10-CM) - Spinal stenosis, cervical region   THERAPY DIAG:  Cervicalgia  Cramp and spasm  Muscle weakness (generalized)  Other abnormalities of gait and mobility  Rationale for Evaluation and Treatment Rehabilitation  ONSET DATE: s/p cervical laminectomy on 04/16/2022  SUBJECTIVE:                                                                                                                                                                                                          SUBJECTIVE STATEMENT: Pt  reports that he is feeling a little weak this morning.  PERTINENT HISTORY:  Angina, CAD, Hx of MI in 2016, Pacemaker placement in 2007, s/p cervical laminectomy 04/16/22, polio as a child  PAIN:  Are you having pain? No  PRECAUTIONS: Cervical, Fall, and ICD/Pacemaker recent cervical laminectomy  WEIGHT BEARING RESTRICTIONS No  FALLS:  Has patient fallen in last 6 months? No  LIVING ENVIRONMENT: Lives with: lives with their spouse Lives in: House/apartment Stairs: Yes: External: 3 steps; can reach both Has following equipment at home: Gilford Rile - 4 wheeled, shower chair, and Grab bars  OCCUPATION: Retired Theatre manager  PLOF: Independent and Leisure: Museum/gallery curator, model train  PATIENT GOALS:  To walk normally again.  OBJECTIVE:   DIAGNOSTIC FINDINGS:  04/16/22 Cervical Radiographs IMPRESSION: Localization film for C3-C4 posterior laminectomy. Surgical instruments overlying the soft tissues posterior to the C2 through C4 spinous processes.  PATIENT SURVEYS:  05/11/2022:  FOTO 51% (projected 60% by visit 12) 06/14/2022:  FOTO 61% (goal met)   COGNITION: Overall cognitive status: Within functional limits for tasks assessed   SENSATION: Reports numbness and tingling in hands and feet  POSTURE:  Right shoulder higher than left with side lean noted  PALPATION: Some muscle spasms noted in cervical region   CERVICAL ROM:   Active ROM A/ROM (deg) eval A/ROM (Deg) 06/14/22  Flexion 40 40  Extension 10 25  Right lateral flexion 15 20  Left lateral flexion 10 15  Right rotation 30 35  Left rotation 19 35   (Blank rows = not tested)  UPPER EXTREMITY ROM:  Eval:   Shoulder Flexion: Right 126 deg, left 105 deg Shoulder Abduction:  right 115 deg, left 85 deg  06/10/2022: Shoulder Flexion: Right 126 deg, left 105  deg Shoulder Abduction:  right 120 deg, left 85 deg    UPPER EXTREMITY AND LOWER EXTREMITY MMT: 05/11/2022:   Right UE strength of 4-/5, Left UE strength of 3/5 Bilateral LE strength of 4/5 grossly throughout  FUNCTIONAL TESTS:  05/11/2022: 5 times sit to stand: 15.3 sec Timed up and go (TUG): 13.4 sec  05/19/2022: 3 minute walk test:  451 ft without assistive device  06/14/2022: 5 times sit to stand: 11.6 sec Timed up and go (TUG):11.9 sec 3 minute walk test:  518 ft   TODAY'S TREATMENT:  Date:  06/14/2022 Nustep level 5 x6 min with PT present to discuss status Seated with 4#:  heel/toe raises, marching, LAQ, hip abduction scissors.  2x10 bilat each Seated chin tuck and cervical rotation.  2x10 each 3 minutes ambulation around PT clinic for 518 ft Seated clamshells with blue tband 2x10 Seated hamstring curls with blue tband 2x10 bilat Seated horizontal abduction and shoulder ER with yellow tband 2x10 each (with cuing to slow down)   Date:  06/10/2022 Nustep level 5 x6 min with PT present to discuss status Seated with 4#:  heel/toe raises, marching, LAQ, hip abduction scissors.  2x10 bilat each Ambulation down PT gym hallway and back with 4# on each ankle x2 with seated recovery period between the trials Seated chin tuck and cervical rotation.  2x10 each Seated clamshells with blue tband 2x10 Seated hamstring curls with blue tband 2x10 bilat Seated blue pball rollout 2x10 to encourage shoulder flexion, then scaption/abduction 2x10 bilat Seated horizontal abduction and shoulder ER with yellow tband 2x10 each (with cuing to slow down)   Date:  06/08/2022 Nustep level 5 x6 min with PT present to discuss status Seated with 4#:  heel/toe raises, marching, LAQ, hip abduction scissors.  2x10 bilat each Ambulation down PT gym hallway and back with 4# on each ankle x2 with seated recovery period between the trials Seated chin tuck and cervical rotation.  2x10 each Seated  clamshells with blue tband 2x10 Seated hamstring curls with blue tband 2x10 bilat Seated blue pball rollout 2x10 to encourage shoulder flexion, then scaption/abduction 2x10 bilat Seated horizontal abduction and shoulder ER with yellow tband 2x10 each (with cuing to slow down)    PATIENT EDUCATION:  Education details: Issued HEP Person educated: Patient Education method: Explanation, Verbal cues, and Handouts Education comprehension: verbalized understanding and returned demonstration   HOME EXERCISE PROGRAM: Access Code: [email or web address] URL: [email or web address] Date: 05/25/2022 Prepared by: Shelby Dubin Eagan Shifflett  Exercises - Seated Cervical Rotation AROM  - 1 x daily - 7 x weekly - 2 sets - 10 reps - Seated Cervical Extension AROM  - 1 x daily - 7 x weekly - 2 sets - 10 reps - Seated Scapular Retraction  - 1 x daily - 7 x weekly - 2 sets - 10 reps - Seated March  - 1 x daily - 7 x weekly - 2 sets - 10 reps - Seated Long Arc Quad  - 1 x daily - 7 x weekly - 2 sets - 10 reps - Supine Shoulder Flexion with Dowel  - 1 x daily - 7 x weekly - 2 sets - 10 reps - Supine Shoulder Abduction AAROM with Dowel  - 1 x daily - 7 x weekly - 2 sets - 10 reps  ASSESSMENT:  CLINICAL IMPRESSION: Mr Ressel presents to skilled PT with him stating that he has made at least 30% improvement since initial evaluation.  Pt is progressing with improved time on TUG and 5 times sit to/from stand and has met FOTO goal for cervical region.  Pt is able to progress increased distance in 3 minute walk test today.  Pt is progressing as anticipated towards goal related activities with improved cervical A/ROM noted and pt reporting improved ease with looking over his shoulder when driving.  Pt continues to require skilled PT to progress towards goal related activiites.   OBJECTIVE IMPAIRMENTS decreased activity tolerance, decreased balance, difficulty walking, decreased ROM, decreased strength, increased muscle spasms,  impaired flexibility, impaired UE functional use, postural dysfunction, and pain.   ACTIVITY LIMITATIONS carrying, stairs, transfers, and reach over head  PARTICIPATION LIMITATIONS: cleaning, driving, and community activity  PERSONAL FACTORS Age and 3+ comorbidities: s/p cervical laminectomy on 04/16/22, polio in childhood, s/p Pacemaker placement, DM, Angina  are also affecting patient's functional outcome.   REHAB POTENTIAL: Good  CLINICAL DECISION MAKING: Evolving/moderate complexity  EVALUATION COMPLEXITY: Moderate   GOALS: Goals reviewed with patient? Yes  SHORT TERM GOALS: Target date: 06/01/2022   Pt will be independent with initial HEP. Baseline:  Goal status: MET  2.  Pt will report at least a 30% improvement in symptoms since starting PT. Baseline: Reports 20% improvement on 06/10/22 Goal status: MET on 06/14/2022   LONG TERM GOALS: Target date: 07/02/2022  Pt will be independent with advanced HEP. Baseline:  Goal status: IN PROGRESS  2.  Pt will increase FOTO to at least 60% to demonstrate improvements in functional mobility. Baseline: 51% Goal status: MET on 11/20//2023  3.  Pt will increase UE and LE bilateral strength to at least 4+/5 to allow him to more easily perform household tasks. Baseline: see above Goal status: INITIAL  4.  Pt will report ability  to ambulate for at least 20 min without any increased pain or loss of balance to allow for community reintegration. Baseline:  Goal status: IN PROGRESS  5.  Pt will increase cervical A/ROM to [facility] to allow him to drive without difficulty or increased pain. Baseline: see above Goal status: IN PROGRESS (see above)    PLAN: PT FREQUENCY: 2x/week  PT DURATION: 8 weeks  PLANNED INTERVENTIONS: Therapeutic exercises, Therapeutic activity, Neuromuscular re-education, Balance training, Gait training, Patient/Family education, Self Care, Joint mobilization, Joint manipulation, Stair training, Aquatic  Therapy, Dry Needling, Spinal manipulation, Spinal mobilization, Cryotherapy, Moist heat, scar mobilization, Taping, Ultrasound, Ionotophoresis 107m/ml Dexamethasone, Manual therapy, and Re-evaluation  PLAN FOR NEXT SESSION: progress and assess HEP, strengthening, A/ROM, balance   SJuel Burrow PT 06/14/2022, 10:12 AM   [facility] [place] [place] [address]Phone # [phone] [phone]

## 2022-06-14 NOTE — Therapy (Signed)
OUTPATIENT OCCUPATIONAL THERAPY   Treatment Note  Patient Name: Kemari Narez MRN: [id number] DOB:08-10-1940, 81 y.o., male Today's Date: 06/14/2022  PCP: Deland Pretty, MD REFERRING PROVIDER: Consuella Lose, MD    OT End of Session - 06/14/22 1120     Visit Number 6    Number of Visits 15    Date for OT Re-Evaluation 07/16/22    Authorization Type [employer]    OT Start Time 1104    OT Stop Time 4854    OT Time Calculation (min) 43 min                  Past Medical History:  Diagnosis Date   Angina pectoris ([place]) 02/03/2015   CAD (coronary artery disease), native coronary artery 08/30/2018   2007 [place], [place]: LIMA to LAD, Occluded SVG to RCA and OM by angiogram S/P  PCI/orbital atherectomy S/P 2.5x15 Resoluute to prox Cx on 11/29/2017.   Coronary artery disease    High cholesterol    Hypertension    Myocardial infarction [facility]) 2017/2018?   NSTEMI (non-ST elevated myocardial infarction) ([place]) 02/03/2015   Presence of permanent cardiac pacemaker    Medtronic   Type II diabetes mellitus ([place])    Past Surgical History:  Procedure Laterality Date   CARDIAC CATHETERIZATION N/A 02/04/2015   Procedure: Left Heart Cath and Cors/Grafts Angiography;  Surgeon: Adrian Prows, MD;  Location: [place] [facility];  Service: Cardiovascular;  Laterality: N/A;   CARDIAC CATHETERIZATION N/A 02/04/2015   Procedure: Coronary Stent Intervention;  Surgeon: Adrian Prows, MD;  Location: [place] [facility];  Service: Cardiovascular;  Laterality: N/A;   COLONOSCOPY     several   CORONARY ANGIOGRAPHY N/A 10/18/2017   Procedure: CORONARY ANGIOGRAPHY;  Surgeon: Adrian Prows, MD;  Location: [facility];  Service: Cardiovascular;  Laterality: N/A;   CORONARY ANGIOPLASTY     CORONARY ANGIOPLASTY WITH STENT PLACEMENT     "i've got 5-6 stents total" (08/30/2017)   CORONARY ARTERY BYPASS GRAFT  2007   CABG X3   CORONARY ATHERECTOMY N/A 10/18/2017   Procedure: CORONARY ATHERECTOMY;   Surgeon: Adrian Prows, MD;  Location: [place] [facility];  Service: Cardiovascular;  Laterality: N/A;   CORONARY ATHERECTOMY N/A 11/29/2017   Procedure: CORONARY ATHERECTOMY;  Surgeon: Adrian Prows, MD;  Location: [place] [facility];  Service: Cardiovascular;  Laterality: N/A;   CORONARY CTO INTERVENTION N/A 08/30/2017   Procedure: CORONARY CTO INTERVENTION;  Surgeon: Adrian Prows, MD;  Location: [place] [facility];  Service: Cardiovascular;  Laterality: N/A;   CORONARY STENT INTERVENTION N/A 11/29/2017   Procedure: CORONARY STENT INTERVENTION;  Surgeon: Adrian Prows, MD;  Location: [place] [facility];  Service: Cardiovascular;  Laterality: N/A;   INSERT / REPLACE / REMOVE PACEMAKER  ?2012   Now currently has Medtronic Pacemaker.  "I think it was placed in 2012"   KNEE ARTHROSCOPY Left 04/03/2020   Procedure: LEFT KNEE ARTHROSCOPY  WASHOUT;  Surgeon: Tania Ade, MD;  Location: [facility];  Service: Orthopedics;  Laterality: Left;   LEFT HEART CATH AND CORONARY ANGIOGRAPHY N/A 08/30/2017   Procedure: LEFT HEART CATH AND CORONARY ANGIOGRAPHY;  Surgeon: Adrian Prows, MD;  Location: [place] [facility];  Service: Cardiovascular;  Laterality: N/A;   LEFT HEART CATH AND CORS/GRAFTS ANGIOGRAPHY N/A 08/05/2017   Procedure: LEFT HEART CATH AND CORS/GRAFTS ANGIOGRAPHY;  Surgeon: Adrian Prows, MD;  Location: [place] [facility];  Service: Cardiovascular;  Laterality: N/A;   LEFT HEART CATH AND CORS/GRAFTS  ANGIOGRAPHY N/A 06/30/2021   Procedure: LEFT HEART CATH AND CORS/GRAFTS ANGIOGRAPHY;  Surgeon: Adrian Prows, MD;  Location: [place] [facility];  Service: Cardiovascular;  Laterality: N/A;   POSTERIOR CERVICAL LAMINECTOMY N/A 04/16/2022   Procedure: POSTERIOR CERVICAL LAMINECTOMY, CERVICAL THREE-CERVICAL FOUR;  Surgeon: Consuella Lose, MD;  Location: [place];  Service: Neurosurgery;  Laterality: N/A;  3C   PPM GENERATOR CHANGEOUT N/A 06/17/2021   Procedure: PPM GENERATOR CHANGEOUT;  Surgeon: Evans Lance, MD;   Location: [place] [facility];  Service: Cardiovascular;  Laterality: N/A;   Patient Active Problem List   Diagnosis Date Noted   Stenosis of cervical spine with myelopathy ([place]) 04/16/2022   Enrolled in clinical trial: OCEANIC-AF (Asundexian - factor XIa inhibitor PO BID vs Apixaban PO BID in patients with A. Fib for stroke prevention. 03/26/2022   PAF (paroxysmal atrial fibrillation) ([place]). CHA2DS2-VASc Score is 5 (A, HTN, DM Vasc Dz). 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 12/15/2018   Flu-like symptoms 09/04/2018   Troponin level elevated 09/02/2018   CAD (coronary artery disease), native coronary artery 08/30/2018   Essential hypertension    Mixed hyperlipidemia    Angina pectoris ([place]) 02/03/2015   Post PTCA 02/03/2015   Diabetes mellitus ([place]) 02/03/2015   Sick sinus syndrome ([place])    Pacemaker Medtronic dual chamber AzureT XT DR MRI SureScan Gen change 06/17/21 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    ONSET DATE: Surgery 04/16/22  REFERRING DIAG: M48.02 (ICD-10-CM) - Spinal stenosis, cervical region   THERAPY DIAG:  Muscle weakness (generalized)  Other lack of coordination  Other disturbances of skin sensation  Rationale for Evaluation and Treatment Rehabilitation  SUBJECTIVE:   SUBJECTIVE STATEMENT: Pt reports that his L hand is more stiff this morning. Pt accompanied by: self  PERTINENT HISTORY: Angina, CAD, Hx of MI in 2016, Pacemaker placement in 2007, s/p cervical laminectomy 04/16/22, polio as a child  PRECAUTIONS: Cervical, Fall, and ICD/Pacemaker - recent cervical laminectomy  WEIGHT BEARING RESTRICTIONS: No  PAIN:  Are you having pain? No  FALLS: Has patient fallen in last 6 months? No   PATIENT GOALS: to have better use of hands  OBJECTIVE:    HAND DOMINANCE: Ambidextrous, learned to use left hand as child as his RUE was effected by polio  FUNCTIONAL OUTCOME MEASURES: Quick Dash: 31.8  UPPER EXTREMITY ROM: Pt reports polio impacted L upper arm more than R upper arm, but R hand impacted significantly more and with no L hand impairments from polio.  Active ROM Right eval Left eval  Shoulder flexion WFL   82  Shoulder abduction    Shoulder adduction 115 78  Shoulder extension    Shoulder internal rotation WFL 90%  Shoulder external rotation WFL 90%  Elbow flexion [facility] WFL  Elbow extension [facility] WFL  Wrist flexion    Wrist extension    Wrist ulnar deviation    Wrist radial deviation    Wrist pronation    Wrist supination    (Blank rows = not tested)  UPPER EXTREMITY MMT:     MMT Right eval Left eval  Shoulder flexion 3+/5 3-/5   Shoulder abduction    Shoulder adduction    Shoulder extension    Shoulder internal rotation    Shoulder external rotation  Middle trapezius    Lower trapezius    Elbow flexion 4-/5 3/5  Elbow extension 4-/5 3-/5  Wrist flexion    Wrist extension    Wrist ulnar deviation    Wrist radial deviation    Wrist pronation    Wrist supination    (Blank rows = not tested)  HAND FUNCTION: Grip strength: Right: 10 lbs; Left: 45 lbs, Lateral pinch: Right: 2 lbs, Left: 12 lbs, and 3 point pinch: Right: 0 lbs, Left: 8 lbs  COORDINATION: Finger Nose Finger test: moderate dysmetria on R, minimal dysmetria on L (completed half as many on R as L in same time frame) 9 Hole Peg test: Right: 1:29.34 (utilized LUE to pick up and place pegs in to R hand d/t polio and removed pegs by grasping between 3-4 digit) sec; Left: 1:25.69 sec Box and Blocks:  Right 31blocks, Left 41blocks  SENSATION: Reports numbness and tingling in hands and feet     TODAY'S TREATMENT:  06/14/22 Coordination: engaged in small peg board pattern replication with LUE.  Pt able to complete placing pegs one by one and  progressing to in-hand manipulation and translation of pegs from finger tips > palm > finger tips.   Sensation: attempting to remove smooth stones from container of dried beans to further assess sensation.  Pt required use of vision to complete task. ADL: reports continued difficulty with buttons. Reports utilizing tools to assist with opening food jars and no difficulty with opening pill bottles.   Dispensing medication: Pt demonstrating BUE use with opening and dispensing simulated pills from pill bottles. Pt reports this is going better at home. Buttons: Pt with increased difficulty with buttons due to prior impairments s/p polio, impaired coordination, and sensation.  OT educated on buttonhook for increased ease and independence with fastening buttons.  Pt able to complete with increased ease and decreased effort.  Provided pt with handout for purchase.    06/10/22 9 hole peg test: Left: 1:08.4 on 06/10/22 and Right: 1:45.13 however not utilizing LUE in picking up pegs as when completed on eval - therefore improved mobility and coordination on R as well as L.  Pt pleased with progression bilaterally. Handwriting: Pt utilizes RUE for handwriting, placing peg in between 3rd and 4th fingers to stabilize.  Pt demonstrating good legibility and motor control, due to compensatory strategies learned s/p polio. Therapeutic exercises: AAROM: closed -chain activity with 2# dowel completing shoulder flexion to 90* and bicep curls, Complete 2x10.  OT providing min cues for technique and visual attention to LUE for improved symmetry.  Noted decreased shoulder flexion on L as pt fatigued, due to increased weight/resistance.  Completed 2 x10 chest presses with 1.5# dowel, still with difficulty but improved from 2# dowel.  Engaged in lateral row from sit > side with 1.5# with focus on shoulder internal/external rotation.      06/08/22 Coordination: engaged in isolated finger exercises with abduction/adduction,  finger extension, gross grasp, and thumb opposition. Pt able to modify tasks as needed to accommodate for impairments s/p polio.  Engaged in picking up variety of small items one at a time and placing in container.  Pt utilizing LUE as primary UE due to impairments in RUE s/p polio but able to utilize RUE as stabilizer to gross assist as needed.  Progressed to in-hand manipulation and translation with 5 small beads and beans from finger tip > palm > finger tips to place into container.  Pt demonstrating mod difficulty with beans but able  to complete with increased time and effort. Stacking checker pieces and flipping/dealing cards with focus on precision pinch and motor control.  Pt needing to slide cards towards edge of table to facilitate increased coordination to flip/rotate cards.   Resistance Clothespins: 2# with LUE for low functional reaching and sustained pinch. Pt initially completing with gross grasp, therefore modified task to facilitate increased pinch.  OT providing min cues for movement pattern and technique.  Pt completing task with lateral pinch, when attempted to complete with 3 jaw chuck pt unable to achieve position and maintain against resistance of 1 or 2# clothespins.  Discussed attempting 3 jaw chuck and precision pinch in massed practice at home.   PATIENT EDUCATION: Education details: HEP for shoulder ROM/strengthening Person educated: Patient Education method: Explanation, Demonstration, Verbal cues, and Handouts Education comprehension: verbalized understanding  HOME EXERCISE PROGRAM: Theraputty exercises and [facility] Main Handout  Access Code: [email or web address] URL: [email or web address] Date: 06/01/2022 Prepared by: [facility]  Exercises - Seated Shoulder Flexion with Dowel to 90  - 1 x daily - 2 sets - 10 reps - Seated Dowel Chest Press  - 1 x daily - 2 sets - 10 reps - Lovena Neighbours  - 1 x daily - 2 sets - 10 reps   GOALS: Goals  reviewed with patient? Yes  SHORT TERM GOALS: Target date: 06/18/22  Pt will be independent with HEP for fine and gross motor coordination and strengthening. Baseline: Goal status: MET - 06/14/22  reports completing some every day  2.  Pt will verbalize understanding of task modifications and/or potential AE needs to increase ease, safety, and independence w/ ADLs. Baseline:  Goal status: IN PROGRESS   LONG TERM GOALS: Target date: 07/16/22  Pt will demonstrate improved L shoulder flexion to retrieve light weight object from cabinet at moderate height with use of BUE as needed. Baseline:  Goal status: IN PROGRESS  2.  Pt will demonstrate improved fine motor coordination for ADLs as evidenced by decreasing 9 hole peg test score for LUE by 10 sec and RUE by 5 secs Baseline: 9 Hole Peg test: Right: 1:29.34 (utilized LUE to pick up and place pegs in to R hand d/t polio and removed pegs by grasping between 3-4 digit) sec; Left: 1:25.69 sec Goal status: IN PROGRESS - Left: 1:08.4 on 06/10/22 and Right: 1:45.13 however not utilizing LUE in picking up pegs  3.  Pt will demonstrate improved ability to open containers and cut food with use of AE as needed. Baseline:  Goal status: IN PROGRESS  4.  Pt will report improved functional use of BUE as demonstrated by improved score on QuickDash to </= to 25 Baseline: 31.8 Goal status: IN PROGRESS   ASSESSMENT:  CLINICAL IMPRESSION: Treatment session with focus on coordination with LUE / RUE with variety of small and flatter objects.  Pt reports AE for opening jars, able to open pill bottles without AE and no increased effort.  Pt educated on buttonhook for managing buttons on shirt, pt pleased with increased ease after demonstration and practice. Pt still demonstrating impaired sensation and limited ROM impacting ability to complete ADL/IADLs at PLOF.  PERFORMANCE DEFICITS: in functional skills including ADLs, IADLs, coordination, dexterity,  sensation, ROM, strength, pain, flexibility, Fine motor control, Gross motor control, balance, body mechanics, decreased knowledge of use of DME, and UE functional use and psychosocial skills including environmental adaptation.   IMPAIRMENTS: are limiting patient from ADLs and IADLs.   CO-MORBIDITIES; may  have co-morbidities  that affects occupational performance. Patient will benefit from skilled OT to address above impairments and improve overall function.  MODIFICATION OR ASSISTANCE TO COMPLETE EVALUATION: Min-Moderate modification of tasks or assist with assess necessary to complete an evaluation.  OT OCCUPATIONAL PROFILE AND HISTORY: Detailed assessment: Review of records and additional review of physical, cognitive, psychosocial history related to current functional performance.  CLINICAL DECISION MAKING: Moderate - several treatment options, min-mod task modification necessary  REHAB POTENTIAL: Good  EVALUATION COMPLEXITY: Moderate    PLAN:  OT FREQUENCY: 1-2x/week  OT DURATION: 8 weeks  PLANNED INTERVENTIONS: self care/ADL training, therapeutic exercise, therapeutic activity, neuromuscular re-education, manual therapy, passive range of motion, functional mobility training, splinting, moist heat, cryotherapy, patient/family education, psychosocial skills training, energy conservation, coping strategies training, and DME and/or AE instructions  RECOMMENDED OTHER SERVICES: NA  CONSULTED AND AGREED WITH PLAN OF CARE: Patient  PLAN FOR NEXT SESSION: Review coordination and strengthening HEP (both hands and L shoulder).  Focus on [place]  and ROM. Educate on compensation strategies for impaired sensation.     Simonne Come, OTR/L 06/14/2022, 11:49 AM

## 2022-06-16 ENCOUNTER — Encounter: Payer: Self-pay | Admitting: Rehabilitative and Restorative Service Providers"

## 2022-06-16 ENCOUNTER — Ambulatory Visit: Payer: Medicare HMO | Admitting: Occupational Therapy

## 2022-06-16 ENCOUNTER — Ambulatory Visit: Payer: Medicare HMO | Admitting: Rehabilitative and Restorative Service Providers"

## 2022-06-16 DIAGNOSIS — M542 Cervicalgia: Secondary | ICD-10-CM

## 2022-06-16 DIAGNOSIS — R278 Other lack of coordination: Secondary | ICD-10-CM

## 2022-06-16 DIAGNOSIS — R208 Other disturbances of skin sensation: Secondary | ICD-10-CM

## 2022-06-16 DIAGNOSIS — M6281 Muscle weakness (generalized): Secondary | ICD-10-CM | POA: Diagnosis not present

## 2022-06-16 DIAGNOSIS — R252 Cramp and spasm: Secondary | ICD-10-CM | POA: Diagnosis not present

## 2022-06-16 DIAGNOSIS — R2689 Other abnormalities of gait and mobility: Secondary | ICD-10-CM

## 2022-06-16 NOTE — Therapy (Signed)
OUTPATIENT OCCUPATIONAL THERAPY   Treatment Note  Patient Name: Jesse [name] MRN: [id number] DOB:Nov 02, 1940, 81 y.o., male Today's Date: 06/16/2022  PCP: Deland Pretty, MD REFERRING PROVIDER: Consuella Lose, MD    OT End of Session - 06/16/22 1023     Visit Number 7    Number of Visits 15    Date for OT Re-Evaluation 07/16/22    Authorization Type [employer]    OT Start Time 64    OT Stop Time 1101    OT Time Calculation (min) 42 min                   Past Medical History:  Diagnosis Date   Angina pectoris ([place]) 02/03/2015   CAD (coronary artery disease), native coronary artery 08/30/2018   2007 [place], [place]: LIMA to LAD, Occluded SVG to RCA and OM by angiogram S/P  PCI/orbital atherectomy S/P 2.5x15 Resoluute to prox Cx on 11/29/2017.   Coronary artery disease    High cholesterol    Hypertension    Myocardial infarction [facility]) 2017/2018?   NSTEMI (non-ST elevated myocardial infarction) ([place]) 02/03/2015   Presence of permanent cardiac pacemaker    Medtronic   Type II diabetes mellitus ([place])    Past Surgical History:  Procedure Laterality Date   CARDIAC CATHETERIZATION N/A 02/04/2015   Procedure: Left Heart Cath and Cors/Grafts Angiography;  Surgeon: Adrian Prows, MD;  Location: [facility];  Service: Cardiovascular;  Laterality: N/A;   CARDIAC CATHETERIZATION N/A 02/04/2015   Procedure: Coronary Stent Intervention;  Surgeon: Adrian Prows, MD;  Location: [place] [facility];  Service: Cardiovascular;  Laterality: N/A;   COLONOSCOPY     several   CORONARY ANGIOGRAPHY N/A 10/18/2017   Procedure: CORONARY ANGIOGRAPHY;  Surgeon: Adrian Prows, MD;  Location: [place] [facility];  Service: Cardiovascular;  Laterality: N/A;   CORONARY ANGIOPLASTY     CORONARY ANGIOPLASTY WITH STENT PLACEMENT     "i've got 5-6 stents total" (08/30/2017)   CORONARY ARTERY BYPASS GRAFT  2007   CABG X3   CORONARY ATHERECTOMY N/A 10/18/2017   Procedure: CORONARY ATHERECTOMY;   Surgeon: Adrian Prows, MD;  Location: [place] [facility];  Service: Cardiovascular;  Laterality: N/A;   CORONARY ATHERECTOMY N/A 11/29/2017   Procedure: CORONARY ATHERECTOMY;  Surgeon: Adrian Prows, MD;  Location: [place] [facility];  Service: Cardiovascular;  Laterality: N/A;   CORONARY CTO INTERVENTION N/A 08/30/2017   Procedure: CORONARY CTO INTERVENTION;  Surgeon: Adrian Prows, MD;  Location: [place] [facility];  Service: Cardiovascular;  Laterality: N/A;   CORONARY STENT INTERVENTION N/A 11/29/2017   Procedure: CORONARY STENT INTERVENTION;  Surgeon: Adrian Prows, MD;  Location: [place] [facility];  Service: Cardiovascular;  Laterality: N/A;   INSERT / REPLACE / REMOVE PACEMAKER  ?2012   Now currently has Medtronic Pacemaker.  "I think it was placed in 2012"   KNEE ARTHROSCOPY Left 04/03/2020   Procedure: LEFT KNEE ARTHROSCOPY  WASHOUT;  Surgeon: Tania Ade, MD;  Location: [facility];  Service: Orthopedics;  Laterality: Left;   LEFT HEART CATH AND CORONARY ANGIOGRAPHY N/A 08/30/2017   Procedure: LEFT HEART CATH AND CORONARY ANGIOGRAPHY;  Surgeon: Adrian Prows, MD;  Location: [place] [facility];  Service: Cardiovascular;  Laterality: N/A;   LEFT HEART CATH AND CORS/GRAFTS ANGIOGRAPHY N/A 08/05/2017   Procedure: LEFT HEART CATH AND CORS/GRAFTS ANGIOGRAPHY;  Surgeon: Adrian Prows, MD;  Location: [place] [facility];  Service: Cardiovascular;  Laterality: N/A;   LEFT HEART CATH AND  CORS/GRAFTS ANGIOGRAPHY N/A 06/30/2021   Procedure: LEFT HEART CATH AND CORS/GRAFTS ANGIOGRAPHY;  Surgeon: Adrian Prows, MD;  Location: [place] [facility];  Service: Cardiovascular;  Laterality: N/A;   POSTERIOR CERVICAL LAMINECTOMY N/A 04/16/2022   Procedure: POSTERIOR CERVICAL LAMINECTOMY, CERVICAL THREE-CERVICAL FOUR;  Surgeon: Consuella Lose, MD;  Location: [place];  Service: Neurosurgery;  Laterality: N/A;  3C   PPM GENERATOR CHANGEOUT N/A 06/17/2021   Procedure: PPM GENERATOR CHANGEOUT;  Surgeon: Evans Lance, MD;   Location: [place] [facility];  Service: Cardiovascular;  Laterality: N/A;   Patient Active Problem List   Diagnosis Date Noted   Stenosis of cervical spine with myelopathy ([place]) 04/16/2022   Enrolled in clinical trial: OCEANIC-AF (Asundexian - factor XIa inhibitor PO BID vs Apixaban PO BID in patients with A. Fib for stroke prevention. 03/26/2022   PAF (paroxysmal atrial fibrillation) ([place]). CHA2DS2-VASc Score is 5 (A, HTN, DM Vasc Dz). 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 12/15/2018   Flu-like symptoms 09/04/2018   Troponin level elevated 09/02/2018   CAD (coronary artery disease), native coronary artery 08/30/2018   Essential hypertension    Mixed hyperlipidemia    Angina pectoris ([place]) 02/03/2015   Post PTCA 02/03/2015   Diabetes mellitus ([place]) 02/03/2015   Sick sinus syndrome ([place])    Pacemaker Medtronic dual chamber AzureT XT DR MRI SureScan Gen change 06/17/21 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    ONSET DATE: Surgery 04/16/22  REFERRING DIAG: M48.02 (ICD-10-CM) - Spinal stenosis, cervical region   THERAPY DIAG:  Muscle weakness (generalized)  Other lack of coordination  Other disturbances of skin sensation  Rationale for Evaluation and Treatment Rehabilitation  SUBJECTIVE:   SUBJECTIVE STATEMENT: Pt reports that the PT went up on his weights today.  Pt accompanied by: self  PERTINENT HISTORY: Angina, CAD, Hx of MI in 2016, Pacemaker placement in 2007, s/p cervical laminectomy 04/16/22, polio as a child  PRECAUTIONS: Cervical, Fall, and ICD/Pacemaker - recent cervical laminectomy  WEIGHT BEARING RESTRICTIONS: No  PAIN:  Are you having pain? No  FALLS: Has patient fallen in last 6 months? No   PATIENT GOALS: to have better use of hands  OBJECTIVE:    HAND DOMINANCE: Ambidextrous, learned to use left hand as child as his RUE was effected by polio  FUNCTIONAL OUTCOME MEASURES: Quick Dash: 31.8  UPPER EXTREMITY ROM: Pt reports polio impacted L upper arm more than R upper arm, but R hand impacted significantly more and with no L hand impairments from polio.  Active ROM Right eval Left eval  Shoulder flexion WFL   82  Shoulder abduction    Shoulder adduction 115 78  Shoulder extension    Shoulder internal rotation WFL 90%  Shoulder external rotation WFL 90%  Elbow flexion [facility] WFL  Elbow extension [facility] WFL  Wrist flexion    Wrist extension    Wrist ulnar deviation    Wrist radial deviation    Wrist pronation    Wrist supination    (Blank rows = not tested)  UPPER EXTREMITY MMT:     MMT Right eval Left eval  Shoulder flexion 3+/5 3-/5   Shoulder abduction    Shoulder adduction    Shoulder extension    Shoulder internal rotation    Shoulder external rotation  Middle trapezius    Lower trapezius    Elbow flexion 4-/5 3/5  Elbow extension 4-/5 3-/5  Wrist flexion    Wrist extension    Wrist ulnar deviation    Wrist radial deviation    Wrist pronation    Wrist supination    (Blank rows = not tested)  HAND FUNCTION: Grip strength: Right: 10 lbs; Left: 45 lbs, Lateral pinch: Right: 2 lbs, Left: 12 lbs, and 3 point pinch: Right: 0 lbs, Left: 8 lbs  COORDINATION: Finger Nose Finger test: moderate dysmetria on R, minimal dysmetria on L (completed half as many on R as L in same time frame) 9 Hole Peg test: Right: 1:29.34 (utilized LUE to pick up and place pegs in to R hand d/t polio and removed pegs by grasping between 3-4 digit) sec; Left: 1:25.69 sec Box and Blocks:  Right 31blocks, Left 41blocks  SENSATION: Reports numbness and tingling in hands and feet     TODAY'S TREATMENT:  06/16/22 Sensation: reviewed compensatory strategies for impaired sensation.  OT educating on using vision to compensate for  impaired sensation, especially when reaching into drawers.  Pt reports noting decrease in numbness. Grooved Pegs: with LUE for increased coordination. Pt requiring use of RUE as gross assist to position peg in L hand.  Pt placed pegs with one at a time and removed with one at a time. Pt completed with mod difficulty and 3 drops. Pt attempting to pick up 2 pegs from floor, able to pick up 1, but requiring assist to pick up other peg. Cutting food: Pt demonstrating gross grasp on utensils, however due to decreased strength plate sliding during simulated cutting.  OT educated on AE, even slip resistant mats to decrease slippage. Pt completed again with dysem underneath plate with significant improvements in control and ability to cut.   Reaching: engaged in reaching into cabinet at moderate and high ranges.  Pt able to remove cups from moderate height with BUE with min effort.  Pt then removing items from high shelf with use of RUE to grasp and LUE to stabilize due to decreased shoulder ROM in LUE.      06/14/22 Coordination: engaged in small peg board pattern replication with LUE.  Pt able to complete placing pegs one by one and progressing to in-hand manipulation and translation of pegs from finger tips > palm > finger tips.   Sensation: attempting to remove smooth stones from container of dried beans to further assess sensation.  Pt required use of vision to complete task. ADL: reports continued difficulty with buttons. Reports utilizing tools to assist with opening food jars and no difficulty with opening pill bottles.   Dispensing medication: Pt demonstrating BUE use with opening and dispensing simulated pills from pill bottles. Pt reports this is going better at home. Buttons: Pt with increased difficulty with buttons due to prior impairments s/p polio, impaired coordination, and sensation.  OT educated on buttonhook for increased ease and independence with fastening buttons.  Pt able to complete with  increased ease and decreased effort.  Provided pt with handout for purchase.    06/10/22 9 hole peg test: Left: 1:08.4 on 06/10/22 and Right: 1:45.13 however not utilizing LUE in picking up pegs as when completed on eval - therefore improved mobility and coordination on R as well as L.  Pt pleased with progression bilaterally. Handwriting: Pt utilizes RUE for handwriting, placing peg in between 3rd and 4th fingers to stabilize.  Pt demonstrating good legibility and motor  control, due to compensatory strategies learned s/p polio. Therapeutic exercises: AAROM: closed -chain activity with 2# dowel completing shoulder flexion to 90* and bicep curls, Complete 2x10.  OT providing min cues for technique and visual attention to LUE for improved symmetry.  Noted decreased shoulder flexion on L as pt fatigued, due to increased weight/resistance.  Completed 2 x10 chest presses with 1.5# dowel, still with difficulty but improved from 2# dowel.  Engaged in lateral row from sit > side with 1.5# with focus on shoulder internal/external rotation.      PATIENT EDUCATION: Education details: HEP for shoulder ROM/strengthening Person educated: Patient Education method: Explanation, Demonstration, Verbal cues, and Handouts Education comprehension: verbalized understanding  HOME EXERCISE PROGRAM: Theraputty exercises and [facility] Handout  Access Code: [email or web address] URL: [email or web address] Date: 06/01/2022 Prepared by: [facility]  Exercises - Seated Shoulder Flexion with Dowel to 90  - 1 x daily - 2 sets - 10 reps - Seated Dowel Chest Press  - 1 x daily - 2 sets - 10 reps - [place] Neighbours  - 1 x daily - 2 sets - 10 reps   GOALS: Goals reviewed with patient? Yes  SHORT TERM GOALS: Target date: 06/18/22  Pt will be independent with HEP for fine and gross motor coordination and strengthening. Baseline: Goal status: MET - 06/14/22  reports completing some every  day  2.  Pt will verbalize understanding of task modifications and/or potential AE needs to increase ease, safety, and independence w/ ADLs. Baseline:  Goal status: MET - 06/16/22   LONG TERM GOALS: Target date: 07/16/22  Pt will demonstrate improved L shoulder flexion to retrieve light weight object from cabinet at moderate height with use of BUE as needed. Baseline:  Goal status: IN PROGRESS  2.  Pt will demonstrate improved fine motor coordination for ADLs as evidenced by decreasing 9 hole peg test score for LUE by 10 sec and RUE by 5 secs Baseline: 9 Hole Peg test: Right: 1:29.34 (utilized LUE to pick up and place pegs in to R hand d/t polio and removed pegs by grasping between 3-4 digit) sec; Left: 1:25.69 sec Goal status: IN PROGRESS - Left: 1:08.4 on 06/10/22 and Right: 1:45.13 however not utilizing LUE in picking up pegs  3.  Pt will demonstrate improved ability to open containers and cut food with use of AE as needed. Baseline:  Goal status: IN PROGRESS  4.  Pt will report improved functional use of BUE as demonstrated by improved score on QuickDash to </= to 25 Baseline: 31.8 Goal status: IN PROGRESS   ASSESSMENT:  CLINICAL IMPRESSION: Treatment session with focus on coordination with LUE / RUE, functional reach, and education on AE for increased independence and ease with ADLs.  Pt continues to demonstrate limited shoulder ROM bilaterally, however able to compensate well.    PERFORMANCE DEFICITS: in functional skills including ADLs, IADLs, coordination, dexterity, sensation, ROM, strength, pain, flexibility, Fine motor control, Gross motor control, balance, body mechanics, decreased knowledge of use of DME, and UE functional use and psychosocial skills including environmental adaptation.   IMPAIRMENTS: are limiting patient from ADLs and IADLs.   CO-MORBIDITIES; may have co-morbidities  that affects occupational performance. Patient will benefit from skilled OT to address  above impairments and improve overall function.  MODIFICATION OR ASSISTANCE TO COMPLETE EVALUATION: Min-Moderate modification of tasks or assist with assess necessary to complete an evaluation.  OT OCCUPATIONAL PROFILE AND HISTORY: Detailed assessment: Review of records and additional  review of physical, cognitive, psychosocial history related to current functional performance.  CLINICAL DECISION MAKING: Moderate - several treatment options, min-mod task modification necessary  REHAB POTENTIAL: Good  EVALUATION COMPLEXITY: Moderate    PLAN:  OT FREQUENCY: 1-2x/week  OT DURATION: 8 weeks  PLANNED INTERVENTIONS: self care/ADL training, therapeutic exercise, therapeutic activity, neuromuscular re-education, manual therapy, passive range of motion, functional mobility training, splinting, moist heat, cryotherapy, patient/family education, psychosocial skills training, energy conservation, coping strategies training, and DME and/or AE instructions  RECOMMENDED OTHER SERVICES: NA  CONSULTED AND AGREED WITH PLAN OF CARE: Patient  PLAN FOR NEXT SESSION: Review coordination and strengthening HEP (both hands and L shoulder).  Focus on [place]  and ROM. Educate on compensation strategies for impaired sensation.     Simonne Come, OTR/L 06/16/2022, 10:23 AM

## 2022-06-16 NOTE — Therapy (Signed)
OUTPATIENT PHYSICAL THERAPY TREATMENT NOTE   Patient Name: Jesse [name] MRN: [id number] DOB:[birth year], 81 y.o., male Today's Date: 06/16/2022    Progress Note Reporting Period 05/11/2022 to 06/14/2022  See note below for Objective Data and Assessment of Progress/Goals.        PT End of Session - 06/16/22 0936     Visit Number 11    Date for PT Re-Evaluation 07/02/22    Authorization Type [employer]    Authorization Time Period 05/11/2022 - 07/02/2022    Authorization - Visit Number 11    Authorization - Number of Visits 16    Progress Note Due on Visit 26    PT Start Time 5277    PT Stop Time 1010    PT Time Calculation (min) 39 min    Activity Tolerance Patient tolerated treatment well    Behavior During Therapy [facility] for tasks assessed/performed              Past Medical History:  Diagnosis Date   Angina pectoris ([place]) 02/03/2015   CAD (coronary artery disease), native coronary artery 08/30/2018   2007 [place], [place]: LIMA to LAD, Occluded SVG to RCA and OM by angiogram S/P  PCI/orbital atherectomy S/P 2.5x15 Resoluute to prox Cx on 11/29/2017.   Coronary artery disease    High cholesterol    Hypertension    Myocardial infarction [facility]) 2017/2018?   NSTEMI (non-ST elevated myocardial infarction) ([place]) 02/03/2015   Presence of permanent cardiac pacemaker    Medtronic   Type II diabetes mellitus ([place])    Past Surgical History:  Procedure Laterality Date   CARDIAC CATHETERIZATION N/A 02/04/2015   Procedure: Left Heart Cath and Cors/Grafts Angiography;  Surgeon: Adrian Prows, MD;  Location: [place] [facility];  Service: Cardiovascular;  Laterality: N/A;   CARDIAC CATHETERIZATION N/A 02/04/2015   Procedure: Coronary Stent Intervention;  Surgeon: Adrian Prows, MD;  Location: [place] [facility];  Service: Cardiovascular;  Laterality: N/A;   COLONOSCOPY     several   CORONARY ANGIOGRAPHY N/A 10/18/2017   Procedure: CORONARY ANGIOGRAPHY;  Surgeon: Adrian Prows, MD;   Location: [place] [facility];  Service: Cardiovascular;  Laterality: N/A;   CORONARY ANGIOPLASTY     CORONARY ANGIOPLASTY WITH STENT PLACEMENT     "i've got 5-6 stents total" (08/30/2017)   CORONARY ARTERY BYPASS GRAFT  2007   CABG X3   CORONARY ATHERECTOMY N/A 10/18/2017   Procedure: CORONARY ATHERECTOMY;  Surgeon: Adrian Prows, MD;  Location: [place] [facility];  Service: Cardiovascular;  Laterality: N/A;   CORONARY ATHERECTOMY N/A 11/29/2017   Procedure: CORONARY ATHERECTOMY;  Surgeon: Adrian Prows, MD;  Location: [place] [facility];  Service: Cardiovascular;  Laterality: N/A;   CORONARY CTO INTERVENTION N/A 08/30/2017   Procedure: CORONARY CTO INTERVENTION;  Surgeon: Adrian Prows, MD;  Location: [place] [facility];  Service: Cardiovascular;  Laterality: N/A;   CORONARY STENT INTERVENTION N/A 11/29/2017   Procedure: CORONARY STENT INTERVENTION;  Surgeon: Adrian Prows, MD;  Location: [place] [facility];  Service: Cardiovascular;  Laterality: N/A;   INSERT / REPLACE / REMOVE PACEMAKER  ?2012   Now currently has Medtronic Pacemaker.  "I think it was placed in 2012"   KNEE ARTHROSCOPY Left 04/03/2020   Procedure: LEFT KNEE ARTHROSCOPY  WASHOUT;  Surgeon: Tania Ade, MD;  Location: [facility];  Service: Orthopedics;  Laterality: Left;   LEFT HEART CATH AND CORONARY ANGIOGRAPHY N/A 08/30/2017   Procedure: LEFT HEART CATH AND CORONARY ANGIOGRAPHY;  Surgeon: Einar Gip,  Ulice Dash, MD;  Location: [place] [facility];  Service: Cardiovascular;  Laterality: N/A;   LEFT HEART CATH AND CORS/GRAFTS ANGIOGRAPHY N/A 08/05/2017   Procedure: LEFT HEART CATH AND CORS/GRAFTS ANGIOGRAPHY;  Surgeon: Adrian Prows, MD;  Location: [place] [facility];  Service: Cardiovascular;  Laterality: N/A;   LEFT HEART CATH AND CORS/GRAFTS ANGIOGRAPHY N/A 06/30/2021   Procedure: LEFT HEART CATH AND CORS/GRAFTS ANGIOGRAPHY;  Surgeon: Adrian Prows, MD;  Location: [place] [facility];  Service: Cardiovascular;  Laterality: N/A;   POSTERIOR CERVICAL  LAMINECTOMY N/A 04/16/2022   Procedure: POSTERIOR CERVICAL LAMINECTOMY, CERVICAL THREE-CERVICAL FOUR;  Surgeon: Consuella Lose, MD;  Location: [place];  Service: Neurosurgery;  Laterality: N/A;  3C   PPM GENERATOR CHANGEOUT N/A 06/17/2021   Procedure: PPM GENERATOR CHANGEOUT;  Surgeon: Evans Lance, MD;  Location: [place] [facility];  Service: Cardiovascular;  Laterality: N/A;   Patient Active Problem List   Diagnosis Date Noted   Stenosis of cervical spine with myelopathy ([place]) 04/16/2022   Enrolled in clinical trial: OCEANIC-AF (Asundexian - factor XIa inhibitor PO BID vs Apixaban PO BID in patients with A. Fib for stroke prevention. 03/26/2022   PAF (paroxysmal atrial fibrillation) ([place]). CHA2DS2-VASc Score is 5 (A, HTN, DM Vasc Dz). 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 12/15/2018   Flu-like symptoms 09/04/2018   Troponin level elevated 09/02/2018   CAD (coronary artery disease), native coronary artery 08/30/2018   Essential hypertension    Mixed hyperlipidemia    Angina pectoris ([place]) 02/03/2015   Post PTCA 02/03/2015   Diabetes mellitus ([place]) 02/03/2015   Sick sinus syndrome [facility])    Pacemaker Medtronic dual chamber AzureT XT DR MRI SureScan Gen change 06/17/21 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    PCP: Deland Pretty, MD  REFERRING PROVIDER: Consuella Lose, MD   REFERRING DIAG: [phone] (ICD-10-CM) - Spinal stenosis, cervical region   THERAPY DIAG:  Cervicalgia  Muscle weakness (generalized)  Cramp and spasm  Other abnormalities of gait and mobility  Rationale for Evaluation and Treatment Rehabilitation  ONSET DATE: s/p cervical laminectomy on 04/16/2022  SUBJECTIVE:                                                                                                                                                                                                          SUBJECTIVE STATEMENT: Pt  reports that he is feeling a little weak this morning.  PERTINENT HISTORY:  Angina, CAD, Hx of MI in 2016, Pacemaker placement in 2007, s/p cervical laminectomy 04/16/22, polio as a child  PAIN:  Are you having pain? No  PRECAUTIONS: Cervical, Fall, and ICD/Pacemaker recent cervical laminectomy  WEIGHT BEARING RESTRICTIONS No  FALLS:  Has patient fallen in last 6 months? No  LIVING ENVIRONMENT: Lives with: lives with their spouse Lives in: House/apartment Stairs: Yes: External: 3 steps; can reach both Has following equipment at home: Gilford Rile - 4 wheeled, shower chair, and Grab bars  OCCUPATION: Retired Theatre manager  PLOF: Independent and Leisure: Museum/gallery curator, model train  PATIENT GOALS:  To walk normally again.  OBJECTIVE:   DIAGNOSTIC FINDINGS:  04/16/22 Cervical Radiographs IMPRESSION: Localization film for C3-C4 posterior laminectomy. Surgical instruments overlying the soft tissues posterior to the C2 through C4 spinous processes.  PATIENT SURVEYS:  05/11/2022:  FOTO 51% (projected 60% by visit 12) 06/14/2022:  FOTO 61% (goal met)   COGNITION: Overall cognitive status: Within functional limits for tasks assessed   SENSATION: Reports numbness and tingling in hands and feet  POSTURE:  Right shoulder higher than left with side lean noted  PALPATION: Some muscle spasms noted in cervical region   CERVICAL ROM:   Active ROM A/ROM (deg) eval A/ROM (Deg) 06/14/22  Flexion 40 40  Extension 10 25  Right lateral flexion 15 20  Left lateral flexion 10 15  Right rotation 30 35  Left rotation 19 35   (Blank rows = not tested)  UPPER EXTREMITY ROM:  Eval:   Shoulder Flexion: Right 126 deg, left 105 deg Shoulder Abduction:  right 115 deg, left 85 deg  06/10/2022: Shoulder Flexion: Right 126 deg, left 105  deg Shoulder Abduction:  right 120 deg, left 85 deg    UPPER EXTREMITY AND LOWER EXTREMITY MMT: 05/11/2022:   Right UE strength of 4-/5, Left UE strength of 3/5 Bilateral LE strength of 4/5 grossly throughout  FUNCTIONAL TESTS:  05/11/2022: 5 times sit to stand: 15.3 sec Timed up and go (TUG): 13.4 sec  05/19/2022: 3 minute walk test:  451 ft without assistive device  06/14/2022: 5 times sit to stand: 11.6 sec Timed up and go (TUG):11.9 sec 3 minute walk test:  518 ft   TODAY'S TREATMENT:  Date:  06/16/2022 Nustep level 5 x6 min with PT present to discuss status Seated chin tuck and cervical rotation.  2x10 each Seated with 4#:  heel/toe raises, marching, LAQ, hip abduction scissors.  2x10 bilat each Sit to/from stand holding 5# kettlebell performing chest press 2x10 Seated blue pball rollout 2x10 to encourage shoulder flexion, then scaption/abduction 2x10 bilat Seated clamshells with blue tband 2x10 Seated hamstring curls with blue tband 2x10 bilat Seated horizontal abduction and shoulder ER with red tband 2x10 each  Standing shoulder rows with red tband 2x10   Date:  06/14/2022 Nustep level 5 x6 min with PT present to discuss status Seated with 4#:  heel/toe raises, marching, LAQ, hip abduction scissors.  2x10 bilat each Seated chin tuck and cervical rotation.  2x10 each 3 minutes ambulation around PT clinic for 518 ft Seated clamshells with blue tband 2x10 Seated hamstring curls with blue tband 2x10 bilat Seated horizontal abduction and shoulder ER with yellow tband 2x10 each (with cuing to slow down)   Date:  06/10/2022 Nustep level 5 x6 min with PT present to discuss status Seated with 4#:  heel/toe raises, marching, LAQ, hip abduction scissors.  2x10 bilat each Ambulation down PT gym hallway and back with 4# on each ankle x2 with seated recovery period between the trials Seated chin tuck and cervical rotation.  2x10 each Seated clamshells with blue tband  2x10 Seated hamstring curls with blue tband 2x10 bilat Seated blue pball rollout 2x10 to encourage shoulder flexion, then scaption/abduction 2x10 bilat Seated horizontal abduction and shoulder ER with yellow tband 2x10 each (with cuing to slow down)     PATIENT EDUCATION:  Education details: Issued HEP Person educated: Patient Education method: Explanation, Verbal cues, and Handouts Education comprehension: verbalized understanding and returned demonstration   HOME EXERCISE PROGRAM: Access Code: [email or web address] URL: [email or web address] Date: 05/25/2022 Prepared by: Shelby Dubin Bronson Bressman  Exercises - Seated Cervical Rotation AROM  - 1 x daily - 7 x weekly - 2 sets - 10 reps - Seated Cervical Extension AROM  - 1 x daily - 7 x weekly - 2 sets - 10 reps - Seated Scapular Retraction  - 1 x daily - 7 x weekly - 2 sets - 10 reps - Seated March  - 1 x daily - 7 x weekly - 2 sets - 10 reps - Seated Long Arc Quad  - 1 x daily - 7 x weekly - 2 sets - 10 reps - Supine Shoulder Flexion with Dowel  - 1 x daily - 7 x weekly - 2 sets - 10 reps - Supine Shoulder Abduction AAROM with Dowel  - 1 x daily - 7 x weekly - 2 sets - 10 reps  ASSESSMENT:  CLINICAL IMPRESSION: Mr Crum presents to skilled PT with no new complaints.  Pt continues to progress with strengthening exercises during session.  Pt with fatigue reported following sit to stand with chest press, requiring seated recovery period in between.  Pt continues to require skilled PT to progress towards goal related activities.   OBJECTIVE IMPAIRMENTS decreased activity tolerance, decreased balance, difficulty walking, decreased ROM, decreased strength, increased muscle spasms, impaired flexibility, impaired UE functional use, postural dysfunction, and pain.   ACTIVITY LIMITATIONS carrying, stairs, transfers, and reach over head  PARTICIPATION LIMITATIONS: cleaning, driving, and community activity  PERSONAL FACTORS Age and 3+  comorbidities: s/p cervical laminectomy on 04/16/22, polio in childhood, s/p Pacemaker placement, DM, Angina  are also affecting patient's functional outcome.   REHAB POTENTIAL: Good  CLINICAL DECISION MAKING: Evolving/moderate complexity  EVALUATION COMPLEXITY: Moderate   GOALS: Goals reviewed with patient? Yes  SHORT TERM GOALS: Target date: 06/01/2022   Pt will be independent with initial HEP. Baseline:  Goal status: MET  2.  Pt will report at least a 30% improvement in symptoms since starting PT. Baseline: Reports 20% improvement on 06/10/22 Goal status: MET on 06/14/2022   LONG TERM GOALS: Target date: 07/02/2022  Pt will be independent with advanced HEP. Baseline:  Goal status: IN PROGRESS  2.  Pt will increase FOTO to at least 60% to demonstrate improvements in functional mobility. Baseline: 51% Goal status: MET on 11/20//2023  3.  Pt will increase UE and LE bilateral strength to at least 4+/5 to allow him to more easily perform household tasks. Baseline: see above Goal status: INITIAL  4.  Pt will report ability to ambulate for at least 20 min without any increased pain or loss of balance to allow for community reintegration. Baseline:  Goal status: IN PROGRESS  5.  Pt will increase cervical A/ROM to [facility] to allow him to drive without difficulty or increased pain. Baseline: see above Goal status: IN PROGRESS (  see above)    PLAN: PT FREQUENCY: 2x/week  PT DURATION: 8 weeks  PLANNED INTERVENTIONS: Therapeutic exercises, Therapeutic activity, Neuromuscular re-education, Balance training, Gait training, Patient/Family education, Self Care, Joint mobilization, Joint manipulation, Stair training, Aquatic Therapy, Dry Needling, Spinal manipulation, Spinal mobilization, Cryotherapy, Moist heat, scar mobilization, Taping, Ultrasound, Ionotophoresis 38m/ml Dexamethasone, Manual therapy, and Re-evaluation  PLAN FOR NEXT SESSION: progress and assess HEP, strengthening,  A/ROM, balance   SJuel Burrow PT 06/16/2022, 10:16 AM   BThe [facility] [place] [place] [address]Phone # [phone] [phone]

## 2022-06-22 ENCOUNTER — Telehealth: Payer: Self-pay | Admitting: Rehabilitative and Restorative Service Providers"

## 2022-06-22 ENCOUNTER — Ambulatory Visit: Payer: Medicare HMO | Admitting: Rehabilitative and Restorative Service Providers"

## 2022-06-22 ENCOUNTER — Ambulatory Visit: Payer: Medicare HMO | Admitting: Occupational Therapy

## 2022-06-22 NOTE — Telephone Encounter (Signed)
Called and spoke to patient secondary to him missing his scheduled appointment this morning.  Patient states that his wife was admitted last night to the hospital with a stroke and he forgot to call to schedule.  Patient will follow up if he will be able to make his next scheduled appointment.

## 2022-06-24 ENCOUNTER — Ambulatory Visit: Payer: Medicare HMO | Admitting: Occupational Therapy

## 2022-06-24 ENCOUNTER — Encounter: Payer: Self-pay | Admitting: Rehabilitative and Restorative Service Providers"

## 2022-06-24 ENCOUNTER — Ambulatory Visit: Payer: Medicare HMO | Admitting: Rehabilitative and Restorative Service Providers"

## 2022-06-24 DIAGNOSIS — R252 Cramp and spasm: Secondary | ICD-10-CM

## 2022-06-24 DIAGNOSIS — R278 Other lack of coordination: Secondary | ICD-10-CM | POA: Diagnosis not present

## 2022-06-24 DIAGNOSIS — M6281 Muscle weakness (generalized): Secondary | ICD-10-CM | POA: Diagnosis not present

## 2022-06-24 DIAGNOSIS — R2689 Other abnormalities of gait and mobility: Secondary | ICD-10-CM | POA: Diagnosis not present

## 2022-06-24 DIAGNOSIS — M542 Cervicalgia: Secondary | ICD-10-CM | POA: Diagnosis not present

## 2022-06-24 DIAGNOSIS — R208 Other disturbances of skin sensation: Secondary | ICD-10-CM

## 2022-06-24 NOTE — Therapy (Addendum)
OUTPATIENT PHYSICAL THERAPY TREATMENT NOTE AND LATE ENTRY DISCHARGE SUMMARY   Patient Name: Jesse [name] MRN: [id number] DOB:August 09, 1940, 81 y.o., male Today's Date: 06/24/2022    Progress Note Reporting Period 05/11/2022 to 06/14/2022  See note below for Objective Data and Assessment of Progress/Goals.        PT End of Session - 06/24/22 0931     Visit Number 12    Date for PT Re-Evaluation 07/02/22    Authorization Type [employer]    Authorization Time Period 05/11/2022 - 07/02/2022    Authorization - Visit Number 12    Authorization - Number of Visits 16    Progress Note Due on Visit 20    PT Start Time 0930    PT Stop Time 1010    PT Time Calculation (min) 40 min    Activity Tolerance Patient tolerated treatment well    Behavior During Therapy [facility] for tasks assessed/performed              Past Medical History:  Diagnosis Date   Angina pectoris ([place]) 02/03/2015   CAD (coronary artery disease), native coronary artery 08/30/2018   2007 [place], [place]: LIMA to LAD, Occluded SVG to RCA and OM by angiogram S/P  PCI/orbital atherectomy S/P 2.5x15 Resoluute to prox Cx on 11/29/2017.   Coronary artery disease    High cholesterol    Hypertension    Myocardial infarction [facility]) 2017/2018?   NSTEMI (non-ST elevated myocardial infarction) ([place]) 02/03/2015   Presence of permanent cardiac pacemaker    Medtronic   Type II diabetes mellitus ([place])    Past Surgical History:  Procedure Laterality Date   CARDIAC CATHETERIZATION N/A 02/04/2015   Procedure: Left Heart Cath and Cors/Grafts Angiography;  Surgeon: Adrian Prows, MD;  Location: [place] [facility];  Service: Cardiovascular;  Laterality: N/A;   CARDIAC CATHETERIZATION N/A 02/04/2015   Procedure: Coronary Stent Intervention;  Surgeon: Adrian Prows, MD;  Location: [place] [facility];  Service: Cardiovascular;  Laterality: N/A;   COLONOSCOPY     several   CORONARY ANGIOGRAPHY N/A 10/18/2017   Procedure: CORONARY  ANGIOGRAPHY;  Surgeon: Adrian Prows, MD;  Location: [place] [facility];  Service: Cardiovascular;  Laterality: N/A;   CORONARY ANGIOPLASTY     CORONARY ANGIOPLASTY WITH STENT PLACEMENT     "i've got 5-6 stents total" (08/30/2017)   CORONARY ARTERY BYPASS GRAFT  2007   CABG X3   CORONARY ATHERECTOMY N/A 10/18/2017   Procedure: CORONARY ATHERECTOMY;  Surgeon: Adrian Prows, MD;  Location: [place] [facility];  Service: Cardiovascular;  Laterality: N/A;   CORONARY ATHERECTOMY N/A 11/29/2017   Procedure: CORONARY ATHERECTOMY;  Surgeon: Adrian Prows, MD;  Location: [place] [facility];  Service: Cardiovascular;  Laterality: N/A;   CORONARY CTO INTERVENTION N/A 08/30/2017   Procedure: CORONARY CTO INTERVENTION;  Surgeon: Adrian Prows, MD;  Location: [place] [facility];  Service: Cardiovascular;  Laterality: N/A;   CORONARY STENT INTERVENTION N/A 11/29/2017   Procedure: CORONARY STENT INTERVENTION;  Surgeon: Adrian Prows, MD;  Location: [place] [facility];  Service: Cardiovascular;  Laterality: N/A;   INSERT / REPLACE / REMOVE PACEMAKER  ?2012   Now currently has Medtronic Pacemaker.  "I think it was placed in 2012"   KNEE ARTHROSCOPY Left 04/03/2020   Procedure: LEFT KNEE ARTHROSCOPY  WASHOUT;  Surgeon: Tania Ade, MD;  Location: [facility];  Service: Orthopedics;  Laterality: Left;   LEFT HEART CATH AND CORONARY ANGIOGRAPHY N/A 08/30/2017   Procedure: LEFT HEART CATH AND  CORONARY ANGIOGRAPHY;  Surgeon: Adrian Prows, MD;  Location: [place] [facility];  Service: Cardiovascular;  Laterality: N/A;   LEFT HEART CATH AND CORS/GRAFTS ANGIOGRAPHY N/A 08/05/2017   Procedure: LEFT HEART CATH AND CORS/GRAFTS ANGIOGRAPHY;  Surgeon: Adrian Prows, MD;  Location: [place] [facility];  Service: Cardiovascular;  Laterality: N/A;   LEFT HEART CATH AND CORS/GRAFTS ANGIOGRAPHY N/A 06/30/2021   Procedure: LEFT HEART CATH AND CORS/GRAFTS ANGIOGRAPHY;  Surgeon: Adrian Prows, MD;  Location: [place] [facility];  Service: Cardiovascular;   Laterality: N/A;   POSTERIOR CERVICAL LAMINECTOMY N/A 04/16/2022   Procedure: POSTERIOR CERVICAL LAMINECTOMY, CERVICAL THREE-CERVICAL FOUR;  Surgeon: Consuella Lose, MD;  Location: [place];  Service: Neurosurgery;  Laterality: N/A;  3C   PPM GENERATOR CHANGEOUT N/A 06/17/2021   Procedure: PPM GENERATOR CHANGEOUT;  Surgeon: Evans Lance, MD;  Location: [place] [facility];  Service: Cardiovascular;  Laterality: N/A;   Patient Active Problem List   Diagnosis Date Noted   Stenosis of cervical spine with myelopathy ([place]) 04/16/2022   Enrolled in clinical trial: OCEANIC-AF (Asundexian - factor XIa inhibitor PO BID vs Apixaban PO BID in patients with A. Fib for stroke prevention. 03/26/2022   PAF (paroxysmal atrial fibrillation) ([place]). CHA2DS2-VASc Score is 5 (A, HTN, DM Vasc Dz). 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 12/15/2018   Flu-like symptoms 09/04/2018   Troponin level elevated 09/02/2018   CAD (coronary artery disease), native coronary artery 08/30/2018   Essential hypertension    Mixed hyperlipidemia    Angina pectoris ([place]) 02/03/2015   Post PTCA 02/03/2015   Diabetes mellitus ([place]) 02/03/2015   Sick sinus syndrome [facility])    Pacemaker Medtronic dual chamber AzureT XT DR MRI SureScan Gen change 06/17/21 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  PERTINENT HISTORY:  Angina, CAD, Hx of MI in 2016, Pacemaker placement in 2007, s/p cervical laminectomy 04/16/22, polio as a child  PAIN:  Are you having pain? No  PRECAUTIONS: Cervical, Fall, and ICD/Pacemaker recent cervical laminectomy  WEIGHT BEARING RESTRICTIONS No  FALLS:  Has patient fallen in last 6 months? No  LIVING ENVIRONMENT: Lives with: lives with their spouse Lives in: House/apartment Stairs: Yes: External: 3 steps; can reach both Has following equipment at home: Gilford Rile - 4 wheeled, shower chair, and Grab bars  OCCUPATION: Retired Theatre manager  PLOF: Independent and Leisure: Museum/gallery curator, model train  PATIENT GOALS:  To walk normally again.  OBJECTIVE:   DIAGNOSTIC FINDINGS:  04/16/22 Cervical Radiographs IMPRESSION: Localization film for C3-C4 posterior laminectomy. Surgical instruments overlying the soft tissues posterior to the C2 through C4 spinous processes.  PATIENT SURVEYS:  05/11/2022:  FOTO 51% (projected 60% by visit 12) 06/14/2022:  FOTO 61% (goal met)   COGNITION: Overall cognitive status: Within functional limits for tasks assessed   SENSATION: Reports numbness and tingling in hands and feet  POSTURE:  Right shoulder higher than left with side lean noted  PALPATION: Some muscle spasms noted in cervical region   CERVICAL ROM:   Active ROM A/ROM (deg) eval A/ROM (Deg) 06/14/22  Flexion 40 40  Extension 10 25  Right lateral flexion 15 20  Left lateral flexion 10 15  Right rotation 30 35  Left rotation 19 35   (Blank rows = not tested)  UPPER EXTREMITY ROM:  Eval:   Shoulder Flexion: Right 126 deg, left 105 deg Shoulder Abduction:  right 115  deg, left 85 deg  06/10/2022: Shoulder Flexion: Right 126 deg, left 105 deg Shoulder Abduction:  right 120 deg, left 85 deg    UPPER EXTREMITY AND LOWER EXTREMITY MMT: 05/11/2022:   Right UE strength of 4-/5, Left UE strength of 3/5 Bilateral LE strength of 4/5 grossly throughout  FUNCTIONAL TESTS:  05/11/2022: 5 times sit to stand: 15.3 sec Timed up and go (TUG): 13.4 sec  05/19/2022: 3 minute walk test:  451 ft without assistive device  06/14/2022: 5 times sit to stand: 11.6 sec Timed up and go (TUG):11.9 sec 3 minute walk test:  518 ft   TODAY'S TREATMENT:   Date:  06/24/2022 Nustep level 5 x6 min with PT present to discuss status Seated chin tuck and cervical rotation.  2x10 each Seated with 4#:  heel/toe raises, marching, LAQ, hip abduction scissors.  2x10 bilat each Ambulation down PT gym hallway and back with 4# x2 with seated recovery period in between Sit to/from stand holding 5# kettlebell performing chest press 2x10 Seated clamshells with blue tband 2x10 Seated hamstring curls with blue tband 2x10 bilat Seated horizontal abduction and shoulder ER with yellow tband 2x10 each (with cuing to slow down) Standing shoulder rows with red tband 2x10 Seated blue pball rollout x10 to encourage shoulder flexion, then scaption/abduction x10 bilat   Date:  06/16/2022 Nustep level 5 x6 min with PT present to discuss status Seated chin tuck and cervical rotation.  2x10 each Seated with 4#:  heel/toe raises, marching, LAQ, hip abduction scissors.  2x10 bilat each Sit to/from stand holding 5# kettlebell performing chest press 2x10 Seated blue pball rollout 2x10 to encourage shoulder flexion, then scaption/abduction 2x10 bilat Seated clamshells with blue tband 2x10 Seated hamstring curls with blue tband  2x10 bilat Seated horizontal abduction and shoulder ER with red tband 2x10 each  Standing shoulder rows with red tband 2x10   Date:  06/14/2022 Nustep level 5 x6 min  with PT present to discuss status Seated with 4#:  heel/toe raises, marching, LAQ, hip abduction scissors.  2x10 bilat each Seated chin tuck and cervical rotation.  2x10 each 3 minutes ambulation around PT clinic for 518 ft Seated clamshells with blue tband 2x10 Seated hamstring curls with blue tband 2x10 bilat Seated horizontal abduction and shoulder ER with yellow tband 2x10 each (with cuing to slow down)    PATIENT EDUCATION:  Education details: Issued HEP Person educated: Patient Education method: Explanation, Verbal cues, and Handouts Education comprehension: verbalized understanding and returned demonstration   HOME EXERCISE PROGRAM: Access Code: [email or web address] URL: [email or web address] Date: 05/25/2022 Prepared by: Shelby Dubin Zerek Litsey  Exercises - Seated Cervical Rotation AROM  - 1 x daily - 7 x weekly - 2 sets - 10 reps - Seated Cervical Extension AROM  - 1 x daily - 7 x weekly - 2 sets - 10 reps - Seated Scapular Retraction  - 1 x daily - 7 x weekly - 2 sets - 10 reps - Seated March  - 1 x daily - 7 x weekly - 2 sets - 10 reps - Seated Long Arc Quad  - 1 x daily - 7 x weekly - 2 sets - 10 reps - Supine Shoulder Flexion with Dowel  - 1 x daily - 7 x weekly - 2 sets - 10 reps - Supine Shoulder Abduction AAROM with Dowel  - 1 x daily - 7 x weekly - 2 sets - 10 reps  ASSESSMENT:  CLINICAL IMPRESSION: Jesse [name] presents to skilled PT stating that he followed up with his neurosurgeon and he was pleased with his progress.  Pt continues to require cuing to slow down with theraband strengthening exercises.  Pt continues to report feeling weakness, but overall doing better.  Pt reports that he is able to do more around his home now without the increased pain and difficulty.  Pt to continues to require skilled PT to progress towards goal related activities.   OBJECTIVE IMPAIRMENTS decreased activity tolerance, decreased balance, difficulty walking, decreased ROM, decreased  strength, increased muscle spasms, impaired flexibility, impaired UE functional use, postural dysfunction, and pain.   ACTIVITY LIMITATIONS carrying, stairs, transfers, and reach over head  PARTICIPATION LIMITATIONS: cleaning, driving, and community activity  PERSONAL FACTORS Age and 3+ comorbidities: s/p cervical laminectomy on 04/16/22, polio in childhood, s/p Pacemaker placement, DM, Angina  are also affecting patient's functional outcome.   REHAB POTENTIAL: Good  CLINICAL DECISION MAKING: Evolving/moderate complexity  EVALUATION COMPLEXITY: Moderate   GOALS: Goals reviewed with patient? Yes  SHORT TERM GOALS: Target date: 06/01/2022   Pt will be independent with initial HEP. Baseline:  Goal status: MET  2.  Pt will report at least a 30% improvement in symptoms since starting PT. Baseline: Reports 20% improvement on 06/10/22 Goal status: MET on 06/14/2022   LONG TERM GOALS: Target date: 07/02/2022  Pt will be independent with advanced HEP. Baseline:  Goal status: IN PROGRESS  2.  Pt will increase FOTO to at least 60% to demonstrate improvements in functional mobility. Baseline: 51% Goal status: MET on 11/20//2023  3.  Pt will increase UE and LE bilateral strength to at least 4+/5 to allow him to more easily perform household tasks. Baseline: see above Goal status: INITIAL  4.  Pt will report ability to  ambulate for at least 20 min without any increased pain or loss of balance to allow for community reintegration. Baseline:  Goal status: IN PROGRESS  5.  Pt will increase cervical A/ROM to [facility] to allow him to drive without difficulty or increased pain. Baseline: see above Goal status: IN PROGRESS (see above)    PLAN: PT FREQUENCY: 2x/week  PT DURATION: 8 weeks  PLANNED INTERVENTIONS: Therapeutic exercises, Therapeutic activity, Neuromuscular re-education, Balance training, Gait training, Patient/Family education, Self Care, Joint mobilization, Joint  manipulation, Stair training, Aquatic Therapy, Dry Needling, Spinal manipulation, Spinal mobilization, Cryotherapy, Moist heat, scar mobilization, Taping, Ultrasound, Ionotophoresis '4mg'$ /ml Dexamethasone, Manual therapy, and Re-evaluation  PLAN FOR NEXT SESSION: progress and assess HEP, strengthening, A/ROM, balance   Juel Burrow, PT 06/24/2022, 10:38 AM   [facility] [address] [place] [address] [place], [place] [address] Phone # [phone] Fax [phone]   PHYSICAL THERAPY DISCHARGE SUMMARY  As of 09/21/2022, pt has not returned for further PT visits.  Patient agrees to discharge. Patient goals were partially met. Patient is being discharged due to not returning since the last visit.  Jailene Cupit, PT 09/21/22 9:47 AM

## 2022-06-24 NOTE — Therapy (Addendum)
OUTPATIENT OCCUPATIONAL THERAPY   Treatment Note  Patient Name: Jesse [name] MRN: [id number] DOB:[birth year], 81 y.o., male Today's Date: 06/24/2022  PCP: Deland Pretty, MD REFERRING PROVIDER: Consuella Lose, MD    OT End of Session - 06/24/22 1037     Visit Number 8    Number of Visits 15    Date for OT Re-Evaluation 07/16/22    Authorization Type [employer]    OT Start Time 86    OT Stop Time 1100    OT Time Calculation (min) 41 min                    Past Medical History:  Diagnosis Date   Angina pectoris ([place]) 02/03/2015   CAD (coronary artery disease), native coronary artery 08/30/2018   2007 [place], [place]: LIMA to LAD, Occluded SVG to RCA and OM by angiogram S/P  PCI/orbital atherectomy S/P 2.5x15 Resoluute to prox Cx on 11/29/2017.   Coronary artery disease    High cholesterol    Hypertension    Myocardial infarction [facility]) 2017/2018?   NSTEMI (non-ST elevated myocardial infarction) ([place]) 02/03/2015   Presence of permanent cardiac pacemaker    Medtronic   Type II diabetes mellitus ([place])    Past Surgical History:  Procedure Laterality Date   CARDIAC CATHETERIZATION N/A 02/04/2015   Procedure: Left Heart Cath and Cors/Grafts Angiography;  Surgeon: Adrian Prows, MD;  Location: [place] [facility];  Service: Cardiovascular;  Laterality: N/A;   CARDIAC CATHETERIZATION N/A 02/04/2015   Procedure: Coronary Stent Intervention;  Surgeon: Adrian Prows, MD;  Location: [place] [facility];  Service: Cardiovascular;  Laterality: N/A;   COLONOSCOPY     several   CORONARY ANGIOGRAPHY N/A 10/18/2017   Procedure: CORONARY ANGIOGRAPHY;  Surgeon: Adrian Prows, MD;  Location: [place] [facility];  Service: Cardiovascular;  Laterality: N/A;   CORONARY ANGIOPLASTY     CORONARY ANGIOPLASTY WITH STENT PLACEMENT     "i've got 5-6 stents total" (08/30/2017)   CORONARY ARTERY BYPASS GRAFT  2007   CABG X3   CORONARY ATHERECTOMY N/A 10/18/2017   Procedure: CORONARY ATHERECTOMY;   Surgeon: Adrian Prows, MD;  Location: [place] [facility];  Service: Cardiovascular;  Laterality: N/A;   CORONARY ATHERECTOMY N/A 11/29/2017   Procedure: CORONARY ATHERECTOMY;  Surgeon: Adrian Prows, MD;  Location: [place] [facility];  Service: Cardiovascular;  Laterality: N/A;   CORONARY CTO INTERVENTION N/A 08/30/2017   Procedure: CORONARY CTO INTERVENTION;  Surgeon: Adrian Prows, MD;  Location: [place] [facility];  Service: Cardiovascular;  Laterality: N/A;   CORONARY STENT INTERVENTION N/A 11/29/2017   Procedure: CORONARY STENT INTERVENTION;  Surgeon: Adrian Prows, MD;  Location: [place] [facility];  Service: Cardiovascular;  Laterality: N/A;   INSERT / REPLACE / REMOVE PACEMAKER  ?2012   Now currently has Medtronic Pacemaker.  "I think it was placed in 2012"   KNEE ARTHROSCOPY Left 04/03/2020   Procedure: LEFT KNEE ARTHROSCOPY  WASHOUT;  Surgeon: Tania Ade, MD;  Location: [facility];  Service: Orthopedics;  Laterality: Left;   LEFT HEART CATH AND CORONARY ANGIOGRAPHY N/A 08/30/2017   Procedure: LEFT HEART CATH AND CORONARY ANGIOGRAPHY;  Surgeon: Adrian Prows, MD;  Location: [place] [facility];  Service: Cardiovascular;  Laterality: N/A;   LEFT HEART CATH AND CORS/GRAFTS ANGIOGRAPHY N/A 08/05/2017   Procedure: LEFT HEART CATH AND CORS/GRAFTS ANGIOGRAPHY;  Surgeon: Adrian Prows, MD;  Location: [place] [facility];  Service: Cardiovascular;  Laterality: N/A;   LEFT HEART CATH  AND CORS/GRAFTS ANGIOGRAPHY N/A 06/30/2021   Procedure: LEFT HEART CATH AND CORS/GRAFTS ANGIOGRAPHY;  Surgeon: Adrian Prows, MD;  Location: [place] [facility];  Service: Cardiovascular;  Laterality: N/A;   POSTERIOR CERVICAL LAMINECTOMY N/A 04/16/2022   Procedure: POSTERIOR CERVICAL LAMINECTOMY, CERVICAL THREE-CERVICAL FOUR;  Surgeon: Consuella Lose, MD;  Location: [place];  Service: Neurosurgery;  Laterality: N/A;  3C   PPM GENERATOR CHANGEOUT N/A 06/17/2021   Procedure: PPM GENERATOR CHANGEOUT;  Surgeon: Evans Lance, MD;   Location: [place] [facility];  Service: Cardiovascular;  Laterality: N/A;   Patient Active Problem List   Diagnosis Date Noted   Stenosis of cervical spine with myelopathy ([place]) 04/16/2022   Enrolled in clinical trial: OCEANIC-AF (Asundexian - factor XIa inhibitor PO BID vs Apixaban PO BID in patients with A. Fib for stroke prevention. 03/26/2022   PAF (paroxysmal atrial fibrillation) ([place]). CHA2DS2-VASc Score is 5 (A, HTN, DM Vasc Dz). 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 12/15/2018   Flu-like symptoms 09/04/2018   Troponin level elevated 09/02/2018   CAD (coronary artery disease), native coronary artery 08/30/2018   Essential hypertension    Mixed hyperlipidemia    Angina pectoris ([place]) 02/03/2015   Post PTCA 02/03/2015   Diabetes mellitus ([place]) 02/03/2015   Sick sinus syndrome ([place])    Pacemaker Medtronic dual chamber AzureT XT DR MRI SureScan Gen change 06/17/21 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    ONSET DATE: Surgery 04/16/22  REFERRING DIAG: M48.02 (ICD-10-CM) - Spinal stenosis, cervical region   THERAPY DIAG:  Muscle weakness (generalized)  Other lack of coordination  Other disturbances of skin sensation  Rationale for Evaluation and Treatment Rehabilitation  SUBJECTIVE:   SUBJECTIVE STATEMENT: Pt reports that his wife may come home from the hospital today. Pt accompanied by: self  PERTINENT HISTORY: Angina, CAD, Hx of MI in 2016, Pacemaker placement in 2007, s/p cervical laminectomy 04/16/22, polio as a child  PRECAUTIONS: Cervical, Fall, and ICD/Pacemaker - recent cervical laminectomy  WEIGHT BEARING RESTRICTIONS: No  PAIN:  Are you having pain? No  FALLS: Has patient fallen in last 6 months? No   PATIENT GOALS: to have better use of  hands  OBJECTIVE:   HAND DOMINANCE: Ambidextrous, learned to use left hand as child as his RUE was effected by polio  FUNCTIONAL OUTCOME MEASURES: Quick Dash: 31.8  UPPER EXTREMITY ROM: Pt reports polio impacted L upper arm more than R upper arm, but R hand impacted significantly more and with no L hand impairments from polio.  Active ROM Right eval Left eval  Shoulder flexion WFL   82  Shoulder abduction    Shoulder adduction 115 78  Shoulder extension    Shoulder internal rotation WFL 90%  Shoulder external rotation WFL 90%  Elbow flexion [facility] WFL  Elbow extension [facility] WFL  Wrist flexion    Wrist extension    Wrist ulnar deviation    Wrist radial deviation    Wrist pronation    Wrist supination    (Blank rows = not tested)  UPPER EXTREMITY MMT:     MMT Right eval Left eval  Shoulder flexion 3+/5 3-/5   Shoulder abduction    Shoulder adduction    Shoulder extension    Shoulder internal rotation    Shoulder external  rotation    Middle trapezius    Lower trapezius    Elbow flexion 4-/5 3/5  Elbow extension 4-/5 3-/5  Wrist flexion    Wrist extension    Wrist ulnar deviation    Wrist radial deviation    Wrist pronation    Wrist supination    (Blank rows = not tested)  HAND FUNCTION: Grip strength: Right: 10 lbs; Left: 45 lbs, Lateral pinch: Right: 2 lbs, Left: 12 lbs, and 3 point pinch: Right: 0 lbs, Left: 8 lbs  COORDINATION: Finger Nose Finger test: moderate dysmetria on R, minimal dysmetria on L (completed half as many on R as L in same time frame) 9 Hole Peg test: Right: 1:29.34 (utilized LUE to pick up and place pegs in to R hand d/t polio and removed pegs by grasping between 3-4 digit) sec; Left: 1:25.69 sec Box and Blocks:  Right 31blocks, Left 41blocks  SENSATION: Reports numbness and tingling in hands and feet     TODAY'S TREATMENT:  06/24/22 Pipe tree puzzle: engaged in pattern replication with PVC pieces with focus on coordination,  functional grasp, and increased shoulder flexion with vertical building.  Pt demonstrating use of BUE with good control, utilizing RUE at diminished to non-dominant level and improved coordination with LUE.  Pt demonstrating L shoulder flexion to 80-90*. Shoulder flexion: attempted wall slides, however diminished shoulder flexion in standing.  Therefore transitioned to sitting completing towel glides with 2# dumbbell to increase challenge.   LUE strengthening: bicep curls 10x2 with 2# dumbbell with LUE.  Pt able to complete shoulder flexion to 80* without weight, however when attempting to complete with dumbbell, pt unable to raise > 40*.  Pt reports decreased shoulder mobility since polio.     06/16/22 Sensation: reviewed compensatory strategies for impaired sensation.  OT educating on using vision to compensate for impaired sensation, especially when reaching into drawers.  Pt reports noting decrease in numbness. Grooved Pegs: with LUE for increased coordination. Pt requiring use of RUE as gross assist to position peg in L hand.  Pt placed pegs with one at a time and removed with one at a time. Pt completed with mod difficulty and 3 drops. Pt attempting to pick up 2 pegs from floor, able to pick up 1, but requiring assist to pick up other peg. Cutting food: Pt demonstrating gross grasp on utensils, however due to decreased strength plate sliding during simulated cutting.  OT educated on AE, even slip resistant mats to decrease slippage. Pt completed again with dysem underneath plate with significant improvements in control and ability to cut.   Reaching: engaged in reaching into cabinet at moderate and high ranges.  Pt able to remove cups from moderate height with BUE with min effort.  Pt then removing items from high shelf with use of RUE to grasp and LUE to stabilize due to decreased shoulder ROM in LUE.      06/14/22 Coordination: engaged in small peg board pattern replication with LUE.  Pt able  to complete placing pegs one by one and progressing to in-hand manipulation and translation of pegs from finger tips > palm > finger tips.   Sensation: attempting to remove smooth stones from container of dried beans to further assess sensation.  Pt required use of vision to complete task. ADL: reports continued difficulty with buttons. Reports utilizing tools to assist with opening food jars and no difficulty with opening pill bottles.   Dispensing medication: Pt demonstrating BUE use with opening and dispensing  simulated pills from pill bottles. Pt reports this is going better at home. Buttons: Pt with increased difficulty with buttons due to prior impairments s/p polio, impaired coordination, and sensation.  OT educated on buttonhook for increased ease and independence with fastening buttons.  Pt able to complete with increased ease and decreased effort.  Provided pt with handout for purchase.   PATIENT EDUCATION: Education details: ongoing condition-specific education Person educated: Patient Education method: Explanation, Demonstration, Verbal cues, and Handouts Education comprehension: verbalized understanding  HOME EXERCISE PROGRAM: Theraputty exercises and [facility] Handout  Access Code: [email or web address] URL: [email or web address] Date: 06/01/2022 Prepared by: [facility]  Exercises - Seated Shoulder Flexion with Dowel to 90  - 1 x daily - 2 sets - 10 reps - Seated Dowel Chest Press  - 1 x daily - 2 sets - 10 reps - [place] Neighbours  - 1 x daily - 2 sets - 10 reps   GOALS: Goals reviewed with patient? Yes  SHORT TERM GOALS: Target date: 06/18/22  Pt will be independent with HEP for fine and gross motor coordination and strengthening. Baseline: Goal status: MET - 06/14/22  reports completing some every day  2.  Pt will verbalize understanding of task modifications and/or potential AE needs to increase ease, safety, and independence w/  ADLs. Baseline:  Goal status: MET - 06/16/22   LONG TERM GOALS: Target date: 07/16/22  Pt will demonstrate improved L shoulder flexion to retrieve light weight object from cabinet at moderate height with use of BUE as needed. Baseline:  Goal status: IN PROGRESS  2.  Pt will demonstrate improved fine motor coordination for ADLs as evidenced by decreasing 9 hole peg test score for LUE by 10 sec and RUE by 5 secs Baseline: 9 Hole Peg test: Right: 1:29.34 (utilized LUE to pick up and place pegs in to R hand d/t polio and removed pegs by grasping between 3-4 digit) sec; Left: 1:25.69 sec Goal status: IN PROGRESS - Left: 1:08.4 on 06/10/22 and Right: 1:45.13 however not utilizing LUE in picking up pegs  3.  Pt will demonstrate improved ability to open containers and cut food with use of AE as needed. Baseline:  Goal status: IN PROGRESS  4.  Pt will report improved functional use of BUE as demonstrated by improved score on QuickDash to </= to 25 Baseline: 31.8 Goal status: IN PROGRESS   ASSESSMENT:  CLINICAL IMPRESSION: Treatment session with focus on coordination with LUE / RUE, functional reach, and strengthening as able.  Pt continues to be limited by impairments s/p polio as a child.  Pt able to demonstrate good shoulder flexion with RUE, however limited LUE flexion.  Therefore completed tasks on table top to facilitate increased flexion while decreasing effects of gravity on movement.    PERFORMANCE DEFICITS: in functional skills including ADLs, IADLs, coordination, dexterity, sensation, ROM, strength, pain, flexibility, Fine motor control, Gross motor control, balance, body mechanics, decreased knowledge of use of DME, and UE functional use and psychosocial skills including environmental adaptation.   IMPAIRMENTS: are limiting patient from ADLs and IADLs.   CO-MORBIDITIES; may have co-morbidities  that affects occupational performance. Patient will benefit from skilled OT to address  above impairments and improve overall function.  MODIFICATION OR ASSISTANCE TO COMPLETE EVALUATION: Min-Moderate modification of tasks or assist with assess necessary to complete an evaluation.  OT OCCUPATIONAL PROFILE AND HISTORY: Detailed assessment: Review of records and additional review of physical, cognitive, psychosocial history related to current  functional performance.  CLINICAL DECISION MAKING: Moderate - several treatment options, min-mod task modification necessary  REHAB POTENTIAL: Good  EVALUATION COMPLEXITY: Moderate    PLAN:  OT FREQUENCY: 1-2x/week  OT DURATION: 8 weeks  PLANNED INTERVENTIONS: self care/ADL training, therapeutic exercise, therapeutic activity, neuromuscular re-education, manual therapy, passive range of motion, functional mobility training, splinting, moist heat, cryotherapy, patient/family education, psychosocial skills training, energy conservation, coping strategies training, and DME and/or AE instructions  RECOMMENDED OTHER SERVICES: NA  CONSULTED AND AGREED WITH PLAN OF CARE: Patient  PLAN FOR NEXT SESSION: Review coordination and strengthening HEP (both hands and L shoulder).  Focus on [place]  and ROM. Educate on compensation strategies for impaired sensation.     Simonne Come, OTR/L 06/24/2022, 10:37 AM   OCCUPATIONAL THERAPY DISCHARGE SUMMARY  Visits from Start of Care: 8  Current functional level related to goals / functional outcomes: Pt did not return to final few scheduled appointments due to being sick.  At last visit, pt was demonstrating significant improvements in ROM and [facility].    Remaining deficits: Limitations in ROM and [place] (largely due to polio as a child)   Education / Equipment: Fine and gross motor control HEP, strengthening with theraputty, shoulder ROM HEP   Patient agrees to discharge. Patient goals were partially met. Patient is being discharged due to not returning since the last visit.Marland Kitchen   Simonne Come  OTR/L 08/10/22

## 2022-06-29 ENCOUNTER — Ambulatory Visit: Payer: Medicare HMO | Admitting: Occupational Therapy

## 2022-06-29 ENCOUNTER — Ambulatory Visit: Payer: Medicare HMO | Admitting: Rehabilitative and Restorative Service Providers"

## 2022-07-01 ENCOUNTER — Ambulatory Visit: Payer: Medicare HMO | Admitting: Rehabilitative and Restorative Service Providers"

## 2022-07-01 ENCOUNTER — Ambulatory Visit: Payer: Medicare HMO | Admitting: Occupational Therapy

## 2022-07-06 ENCOUNTER — Encounter: Payer: Medicare HMO | Admitting: Occupational Therapy

## 2022-07-08 ENCOUNTER — Encounter: Payer: Medicare HMO | Admitting: Occupational Therapy

## 2022-07-13 ENCOUNTER — Ambulatory Visit: Payer: Medicare HMO | Attending: Neurosurgery | Admitting: Occupational Therapy

## 2022-07-15 ENCOUNTER — Ambulatory Visit: Payer: Medicare HMO | Admitting: Occupational Therapy

## 2022-08-06 ENCOUNTER — Telehealth: Payer: Self-pay | Admitting: *Deleted

## 2022-08-06 NOTE — Patient Outreach (Signed)
  Care Coordination   Follow Up Visit Note   08/06/2022 Name: Jesse [name] MRN: [id number] DOB: 1941/03/31  Jesse [name] is a 82 y.o. year old male who sees Deland Pretty, MD for primary care. I  spoke with daughter, [name].  What matters to the patients health and wellness today?  Pt doing well- no needs    Goals Addressed             This Visit's Progress    COMPLETED: Transportation and other resources       Care Coordination Interventions:  Spoke with pt's daughter, Roena Malady, who reports pt did well with outpatient PT and  OT. Discussed pt's [employer] benefits and encouraged enrolling online for "[employer]humana" to ensure knowledge and use of resources offered.   Solution-Focused Strategies employed:  Mindfulness or Relaxation training provided Problem [place] strategies reviewed Review [employer] portal and benefits you have through your policy         SDOH assessments and interventions completed:  Yes     Care Coordination Interventions:  Yes, provided   Follow up plan:  08/23/22    Encounter Outcome:  Pt. Visit Completed

## 2022-08-26 DIAGNOSIS — E46 Unspecified protein-calorie malnutrition: Secondary | ICD-10-CM | POA: Diagnosis not present

## 2022-09-14 DIAGNOSIS — F5104 Psychophysiologic insomnia: Secondary | ICD-10-CM | POA: Diagnosis not present

## 2022-09-14 DIAGNOSIS — Z45018 Encounter for adjustment and management of other part of cardiac pacemaker: Secondary | ICD-10-CM | POA: Diagnosis not present

## 2022-09-14 DIAGNOSIS — I10 Essential (primary) hypertension: Secondary | ICD-10-CM | POA: Diagnosis not present

## 2022-09-14 DIAGNOSIS — I495 Sick sinus syndrome: Secondary | ICD-10-CM | POA: Diagnosis not present

## 2022-09-20 DIAGNOSIS — M4802 Spinal stenosis, cervical region: Secondary | ICD-10-CM | POA: Diagnosis not present

## 2022-09-23 DIAGNOSIS — E118 Type 2 diabetes mellitus with unspecified complications: Secondary | ICD-10-CM | POA: Diagnosis not present

## 2022-09-23 DIAGNOSIS — E782 Mixed hyperlipidemia: Secondary | ICD-10-CM | POA: Diagnosis not present

## 2022-09-23 DIAGNOSIS — I10 Essential (primary) hypertension: Secondary | ICD-10-CM | POA: Diagnosis not present

## 2022-09-23 DIAGNOSIS — I480 Paroxysmal atrial fibrillation: Secondary | ICD-10-CM | POA: Diagnosis not present

## 2022-09-30 ENCOUNTER — Encounter: Payer: Self-pay | Admitting: Cardiology

## 2022-09-30 ENCOUNTER — Ambulatory Visit: Payer: Medicare HMO | Admitting: Cardiology

## 2022-09-30 VITALS — BP 133/65 | HR 81 | Resp 16 | Ht 74.0 in | Wt 152.0 lb

## 2022-09-30 DIAGNOSIS — I495 Sick sinus syndrome: Secondary | ICD-10-CM

## 2022-09-30 DIAGNOSIS — I480 Paroxysmal atrial fibrillation: Secondary | ICD-10-CM | POA: Diagnosis not present

## 2022-09-30 DIAGNOSIS — Z950 Presence of cardiac pacemaker: Secondary | ICD-10-CM

## 2022-09-30 DIAGNOSIS — I25118 Atherosclerotic heart disease of native coronary artery with other forms of angina pectoris: Secondary | ICD-10-CM | POA: Diagnosis not present

## 2022-09-30 NOTE — Progress Notes (Signed)
Primary Physician/Referring:  Deland Pretty, MD  Patient ID: Jesse [name], male    DOB: May 12, 1941, 82 y.o.   MRN: [id number]  Chief Complaint  Patient presents with   Coronary Artery Disease   Atrial Fibrillation   Follow-up    6 month   HPI:    Jesse [name]  is a 82 y.o. male  with coronary artery disease and CABG in 2007 in [place] with LIMA to LAD, SVG to OM which is occluded by angiography on 02/04/2015, occluded SVG to RCA. He underwent complex successful arthrectomy and PCI to Cx on 11/29/2017.  His past medical history also includes sick sinus syndrome status post Medtronic pacemaker implantation 2011, generator change out 06/17/2021, PAF, diabetes mellitus, hypertension and hyperlipidemia.  He is presently doing well, except he has lost about 25 pounds in weight as he is edentulous as he had his teeth extracted and still going through reconstruction.  Since weight loss, he has discontinued all his blood pressure medications.  He remains asymptomatic from cardiac standpoint.  Denies any palpitations, dizziness or syncope.  Past Medical History:  Diagnosis Date   Angina pectoris ([place]) 02/03/2015   CAD (coronary artery disease), native coronary artery 08/30/2018   2007 [place], [place]: LIMA to LAD, Occluded SVG to RCA and OM by angiogram S/P  PCI/orbital atherectomy S/P 2.5x15 Resoluute to prox Cx on 11/29/2017.   Coronary artery disease    High cholesterol    Hypertension    Myocardial infarction [facility]) 2017/2018?   NSTEMI (non-ST elevated myocardial infarction) ([place]) 02/03/2015   Presence of permanent cardiac pacemaker    Medtronic   Type II diabetes mellitus ([place])    Social History   Tobacco Use   Smoking status: Former    Packs/day: 0.50    Years: 3.00    Total pack years: 1.50    Types: Cigarettes    Quit date: 1960    Years since quitting: 64.2   Smokeless tobacco: Never  Substance Use Topics   Alcohol use: Not Currently   Marital Status: Widowed ROS   Review of Systems  Constitutional: Positive for weight loss.  Cardiovascular:  Positive for dyspnea on exertion. Negative for chest pain and leg swelling.   Objective  Blood pressure 133/65, pulse 81, resp. rate 16, height '6\' 2"'$  (1.88 m), weight 152 lb (68.9 kg), SpO2 96 %.     09/30/2022   11:11 AM 04/24/2022    7:05 PM 04/24/2022    2:40 PM  Vitals with BMI  Height '6\' 2"'$     Weight 152 lbs    BMI 0000000    Systolic [id number]  Diastolic 65 60 59  Pulse 81 71 72     Physical Exam Vitals reviewed.  Neck:     Vascular: No carotid bruit or JVD.  Cardiovascular:     Rate and Rhythm: Normal rate and regular rhythm.     Pulses: Intact distal pulses.          Popliteal pulses are 2+ on the right side and 2+ on the left side.       Dorsalis pedis pulses are 2+ on the right side and 1+ on the left side.       Posterior tibial pulses are 0 on the right side and 0 on the left side.     Heart sounds: Heart sounds are distant. No murmur heard.    Early systolic murmur is present at the upper right sternal border.  No gallop.  Pulmonary:     Effort: Pulmonary effort is normal.     Breath sounds: Normal breath sounds.  Abdominal:     General: Bowel sounds are normal.     Palpations: Abdomen is soft.  Musculoskeletal:     Right lower leg: No edema.     Left lower leg: No edema.    Laboratory examination:   External labs:  Labs 12/10/2021:  Hb 13.2/HCT 38.3, platelets 151.  Normal indicis.  Sodium 140, potassium 4.7, BUN 21, creatinine 1.15, EGFR 59 mL, LFTs normal.  Urine analysis normal.  A1C 6.5, TSH 3.090  Total cholesterol 198, triglycerides 160, HDL 38, LDL 128.   12/03/2020:  A1c 6.7%. Total cholesterol 95, triglycerides 108, HDL 40, LDL 33. Hb 12.7/HCT 37.4, platelets 142.  Normal indicis. Serum glucose 144, BUN 21, creatinine 1.02, EGFR 69 mL, potassium 4.4.  CMP normal.  Allergies   Allergies  Allergen Reactions   Brilinta [Ticagrelor] Anaphylaxis and  Shortness Of Breath    Difficulty breathing      Final Medications at End of Visit     Current Outpatient Medications:    dapagliflozin propanediol (FARXIGA) 10 MG TABS tablet, Take 10 mg by mouth daily., Disp: , Rfl:    dorzolamide-timolol (COSOPT) 22.3-6.8 MG/ML ophthalmic solution, Place 1 drop into both eyes 2 (two) times daily., Disp: , Rfl: 10   ezetimibe (ZETIA) 10 MG tablet, TAKE 1 TABLET BY MOUTH EVERY DAY AFTER SUPPER, Disp: 30 tablet, Rfl: 2   hydrocortisone cream 1 %, Apply 1 application topically 2 (two) times daily as needed for itching., Disp: , Rfl:    isosorbide mononitrate (IMDUR) 30 MG 24 hr tablet, Take 1 tablet (30 mg total) by mouth daily., Disp: 100 tablet, Rfl: 3   latanoprost (XALATAN) 0.005 % ophthalmic solution, Place 1 drop into both eyes at bedtime., Disp: , Rfl: 12   metFORMIN (GLUCOPHAGE-XR) 500 MG 24 hr tablet, Take by mouth. 1 tablet in the morning and 2 tablets in the evening, Disp: , Rfl:    nitroGLYCERIN (NITROSTAT) 0.4 MG SL tablet, Place 1 tablet (0.4 mg total) under the tongue every 5 (five) minutes as needed for chest pain., Disp: 25 tablet, Rfl: 3   polyethylene glycol (MIRALAX) 17 g packet, Take 17 g by mouth daily., Disp: 14 each, Rfl: 0   rivaroxaban (XARELTO) 20 MG TABS tablet, Take 20 mg by mouth daily with supper., Disp: , Rfl:    rosuvastatin (CRESTOR) 20 MG tablet, Take 20 mg by mouth at bedtime., Disp: , Rfl:    TRUE METRIX BLOOD GLUCOSE TEST test strip, 2 (two) times daily., Disp: , Rfl:    vitamin B-12 (CYANOCOBALAMIN) 1000 MCG tablet, Take 1,000 mcg by mouth daily., Disp: , Rfl:    VITAMIN D, CHOLECALCIFEROL, PO, Take 10,000 Units by mouth daily., Disp: , Rfl:    Biotin 5000 MCG TABS, Take 5,000 mcg by mouth daily. (Patient not taking: Reported on 09/30/2022), Disp: , Rfl:    Radiology:   CXR 04/02/2020: Left pacer remains in place, unchanged. Heart is normal size. No confluent opacities or effusions. No acute bony abnormality.  Cardiac  Studies:   Lexiscan/modified Bruce Tetrofosmin stress test 02/06/2020: Lexiscan/modified Bruce nuclear stress test performed using 1-day protocol. Stress EKG is non-diagnostic, as this is pharmacological stress test. In addition, stress EKG at 110% MPHR showed AV paced rhythm.  SPECT images show medium sized, mild intensity, partially reversible perfusion defect in apical to basal inferior and basal inferoseptal myocardium.  While septal perfusion defect may be due to paced rhythm, ischemia cannot be excluded. Recommend clinical correlation.  Stress LVEF 44%. High risk study. No significant change from 02/14/2017.   Echocardiogram 06/23/2021:  Normal LV systolic function with visual EF 55-60%. Left ventricle cavity  is normal in size. Moderate left ventricular hypertrophy. Normal global  wall motion. Normal diastolic filling pattern, normal LAP.  Mild to moderate aortic regurgitation.  Mild (Grade I) mitral regurgitation.  Mild tricuspid regurgitation. No evidence of pulmonary hypertension.  Compared to study 123XX123 diastolic function improved (indeterminate to normal), moderate TR is now mild, Aortic root was 106m to 371m.   Coronary angiography 06/30/2021: RCA: Long CTO.  He has ipsilateral collaterals as contralateral collaterals from the LAD. LM: Large vessel.  Mildly calcified. LAD: Ostial 40 to 50% stenosis, diffuse.  Continues as a large D1 as the LAD is occluded in the proximal segment.   LIMA to LAD is widely RI: Moderate caliber vessel, ostial 40 to 50% stenosis, calcific. CX: 2.5 x 15 mm resolute Onyx DES placed 11/29/2017 in the ostium of the CX is widely patent.  CX gives origin to large OM1 and OM 2.  There is mild diffuse disease. LV: Mild inferior hypokinesis, preserved LVEF at 50 to 55%.  No significant MR. Hemodynamics: LV 93/0, EDP 7 mmHg.  Ao 89/41, mean 60 mmHg.  There was no pressure gradient across the aortic valve.   Recommendation: Coronary anatomy is not  significantly changed from 2019.  He has small vessel disease, continue medical therapy as indicated.  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  70 mL contrast utilized.  Pacemaker Medtronic dual chamber AZURE XT DR MRI, Gen change 06/17/2021   Remote pacemaker transmission 09/14/2022: Longevity 11 years and 2 months.  AP 33 %, VP 96 %.  Lead impedance and thresholds are normal.  1 episode of brief atrial tachycardia.  Normal dual-chamber pacemaker function.  EKG   EKG 2024: Underlying Sinus with Ventricular Paced Rhythm at Rate of 82 Bpm.  No Further Analysis.  Assessment     ICD-10-CM   1. Coronary artery disease of native artery of native heart with stable angina [place]is ([place] I25.118 EKG 12-Lead    2. PAF (paroxysmal atrial fibrillation) ([place] CHA2DS2-VASc Score is 5 (A, HTN, DM Vasc Dz). Yearly risk of stroke 6.7%  I48.0     3. Sick sinus syndrome (HCC)  I49.5     4. Pacemaker Medtronic dual chamber AzureT XT DR MRI SureScan Gen change 06/17/21  Z95.0       No orders of the defined types were placed in this encounter.   Medications Discontinued During This Encounter  Medication Reason   glycerin adult 2 g suppository Patient Preference   Investigational - Study Medication Patient Preference   losartan (COZAAR) 25 MG tablet Patient Preference   metoprolol tartrate (LOPRESSOR) 25 MG tablet Patient Preference   XARELTO 20 MG TABS tablet    HYDROcodone-acetaminophen (NORCO/VICODIN) 5-325 MG tablet No longer needed (for PRN medications)   metoprolol tartrate (LOPRESSOR) 25 MG tablet Patient Preference    Recommendations:   DiLani Beaversis a [age].o. male  [place], SVG to OM which is occluded by angiography on 02/04/2015, occluded SVG to RCA. He underwent complex successful arthrectomy and PCI to Cx on 11/29/2017.  His past medical history also includes sick sinus syndrome status  post Medtronic pacemaker  implantation 2011, generator change out 06/17/2021, PAF, diabetes mellitus, hypertension and hyperlipidemia.  1. Coronary artery disease of native artery of native heart with stable angina pectoris [facility]) Patient with coronary disease with angina, he has not had any recurrence of angina pectoris recently and has not used any sublingual nitroglycerin, he is presently only on isosorbide mononitrate 30 mg daily.  He has lost about 30 pounds in weight over the past 1 year due to difficulty swallowing as he has become edentulous and is working on his dentures soon.  Since then due to low blood pressure and dizziness, he has stopped losartan and also metoprolol.  Once he starts gaining weight back, I would like to restart losartan in view of diabetes state and also CAD standpoint.  2. PAF (paroxysmal atrial fibrillation) ([place]). CHA2DS2-VASc Score is 5 (A, HTN, DM Vasc Dz). Yearly risk of stroke 6.7% Patient is presently doing well and in view of high chads vascular score, presently on Xarelto 20 mg in the evening, continue the same.  External labs reviewed, CBC has remained stable.  Renal function is normal.  3. Sick sinus syndrome [facility]) Patient has sick sinus syndrome and also AV block, presently SP pacemaker implantation, doing well.  4. Pacemaker Medtronic dual chamber AzureT XT DR MRI SureScan Gen change 06/17/21 Pacemaker data reviewed, functioning normally.  He has not had any recent atrial fibrillation episodes.  Otherwise stable from cardiac standpoint, I will see him back in 6 months or sooner if problems.  If after his dental issues are resolved, if he still does not gain weight, we may have to consider evaluation for mesenteric ischemia.   Adrian Prows, MD, [facility] 09/30/2022, 11:46 AM Office: [phone] Fax: [phone] Pager: [phone]

## 2022-10-26 ENCOUNTER — Other Ambulatory Visit: Payer: Self-pay | Admitting: Cardiology

## 2022-10-26 DIAGNOSIS — I25118 Atherosclerotic heart disease of native coronary artery with other forms of angina pectoris: Secondary | ICD-10-CM

## 2022-11-15 DIAGNOSIS — E118 Type 2 diabetes mellitus with unspecified complications: Secondary | ICD-10-CM | POA: Diagnosis not present

## 2022-11-15 DIAGNOSIS — R634 Abnormal weight loss: Secondary | ICD-10-CM | POA: Diagnosis not present

## 2022-12-23 DIAGNOSIS — Z45018 Encounter for adjustment and management of other part of cardiac pacemaker: Secondary | ICD-10-CM | POA: Diagnosis not present

## 2022-12-23 DIAGNOSIS — I495 Sick sinus syndrome: Secondary | ICD-10-CM | POA: Diagnosis not present

## 2023-01-13 DIAGNOSIS — E118 Type 2 diabetes mellitus with unspecified complications: Secondary | ICD-10-CM | POA: Diagnosis not present

## 2023-01-20 DIAGNOSIS — I25119 Atherosclerotic heart disease of native coronary artery with unspecified angina pectoris: Secondary | ICD-10-CM | POA: Diagnosis not present

## 2023-01-20 DIAGNOSIS — I10 Essential (primary) hypertension: Secondary | ICD-10-CM | POA: Diagnosis not present

## 2023-01-20 DIAGNOSIS — E782 Mixed hyperlipidemia: Secondary | ICD-10-CM | POA: Diagnosis not present

## 2023-01-20 DIAGNOSIS — Z0000 Encounter for general adult medical examination without abnormal findings: Secondary | ICD-10-CM | POA: Diagnosis not present

## 2023-01-20 DIAGNOSIS — E118 Type 2 diabetes mellitus with unspecified complications: Secondary | ICD-10-CM | POA: Diagnosis not present

## 2023-01-20 DIAGNOSIS — D696 Thrombocytopenia, unspecified: Secondary | ICD-10-CM | POA: Diagnosis not present

## 2023-01-20 DIAGNOSIS — Z950 Presence of cardiac pacemaker: Secondary | ICD-10-CM | POA: Diagnosis not present

## 2023-01-20 DIAGNOSIS — I480 Paroxysmal atrial fibrillation: Secondary | ICD-10-CM | POA: Diagnosis not present

## 2023-01-20 DIAGNOSIS — R202 Paresthesia of skin: Secondary | ICD-10-CM | POA: Diagnosis not present

## 2023-01-28 DIAGNOSIS — G5602 Carpal tunnel syndrome, left upper limb: Secondary | ICD-10-CM | POA: Diagnosis not present

## 2023-01-28 DIAGNOSIS — G5601 Carpal tunnel syndrome, right upper limb: Secondary | ICD-10-CM | POA: Diagnosis not present

## 2023-03-30 DIAGNOSIS — I495 Sick sinus syndrome: Secondary | ICD-10-CM | POA: Diagnosis not present

## 2023-03-30 DIAGNOSIS — Z45018 Encounter for adjustment and management of other part of cardiac pacemaker: Secondary | ICD-10-CM | POA: Diagnosis not present

## 2023-04-04 ENCOUNTER — Ambulatory Visit: Payer: Medicare HMO | Admitting: Cardiology

## 2023-04-04 ENCOUNTER — Encounter: Payer: Self-pay | Admitting: Cardiology

## 2023-04-04 VITALS — BP 136/63 | HR 88 | Resp 16 | Ht 74.0 in | Wt 156.0 lb

## 2023-04-04 DIAGNOSIS — E782 Mixed hyperlipidemia: Secondary | ICD-10-CM

## 2023-04-04 DIAGNOSIS — I480 Paroxysmal atrial fibrillation: Secondary | ICD-10-CM | POA: Diagnosis not present

## 2023-04-04 DIAGNOSIS — I495 Sick sinus syndrome: Secondary | ICD-10-CM | POA: Diagnosis not present

## 2023-04-04 DIAGNOSIS — I10 Essential (primary) hypertension: Secondary | ICD-10-CM

## 2023-04-04 DIAGNOSIS — I25118 Atherosclerotic heart disease of native coronary artery with other forms of angina pectoris: Secondary | ICD-10-CM

## 2023-04-04 MED ORDER — LOSARTAN POTASSIUM 25 MG PO TABS: 25.0000 mg | ORAL_TABLET | Freq: Every day | ORAL | 3 refills | Status: DC

## 2023-04-04 NOTE — Progress Notes (Signed)
Primary Physician/Referring:  Merri Brunette, MD  Patient ID: Jesse [name], male    DOB: 1940/08/16, 82 y.o.   MRN: [id number]  Chief Complaint  Patient presents with   Coronary Artery Disease   Follow-up    6 month   HPI:    Jesse [name]  is a 82 y.o. male  with coronary artery disease and CABG in 2007 in [place] with LIMA to LAD, SVG to OM which is occluded by angiography on 02/04/2015, occluded SVG to RCA. He underwent complex successful arthrectomy and PCI to Cx on 11/29/2017.  His past medical history also includes sick sinus syndrome status post Medtronic pacemaker implantation 2011, generator change out 06/17/2021, PAF, diabetes mellitus, hypertension and hyperlipidemia.  He is presently doing well,  He remains asymptomatic from cardiac standpoint.  Denies any palpitations, dizziness or syncope.  Past Medical History:  Diagnosis Date   Angina pectoris (HCC) 02/03/2015   CAD (coronary artery disease), native coronary artery 08/30/2018   2007 [place], [place]: LIMA to LAD, Occluded SVG to RCA and OM by angiogram S/P  PCI/orbital atherectomy S/P 2.5x15 Resoluute to prox Cx on 11/29/2017.   Coronary artery disease    High cholesterol    Hypertension    Myocardial infarction [facility]) 2017/2018?   NSTEMI (non-ST elevated myocardial infarction) (HCC) 02/03/2015   Presence of permanent cardiac pacemaker    Medtronic   Type II diabetes mellitus (HCC)    Social History   Tobacco Use   Smoking status: Former    Current packs/day: 0.00    Average packs/day: 0.5 packs/day for 3.0 years (1.5 ttl pk-yrs)    Types: Cigarettes    Start date: 61    Quit date: 93    Years since quitting: 64.7   Smokeless tobacco: Never  Substance Use Topics   Alcohol use: Not Currently   Marital Status: Widowed ROS  Review of Systems  Cardiovascular:  Negative for chest pain, dyspnea on exertion and leg swelling.   Objective  Blood pressure 136/63, pulse 88, resp. rate 16, height 6\' 2"  (1.88 m),  weight 156 lb (70.8 kg), SpO2 95%.     04/04/2023    1:10 PM 09/30/2022   11:11 AM 04/24/2022    7:05 PM  Vitals with BMI  Height 6\' 2"  6\' 2"    Weight 156 lbs 152 lbs   BMI 20.02 19.51   Systolic 136 133 409  Diastolic 63 65 60  Pulse 88 81 71     Physical Exam Vitals reviewed.  Neck:     Vascular: No carotid bruit or JVD.  Cardiovascular:     Rate and Rhythm: Normal rate and regular rhythm.     Pulses: Intact distal pulses.          Popliteal pulses are 2+ on the right side and 2+ on the left side.       Dorsalis pedis pulses are 2+ on the right side and 1+ on the left side.       Posterior tibial pulses are 0 on the right side and 0 on the left side.     Heart sounds: Heart sounds are distant. No murmur heard.    Early systolic murmur is present at the upper right sternal border.     No gallop.  Pulmonary:     Effort: Pulmonary effort is normal.     Breath sounds: Normal breath sounds.  Abdominal:     General: Bowel sounds are normal.     Palpations: Abdomen  is soft.  Musculoskeletal:     Right lower leg: No edema.     Left lower leg: No edema.    Laboratory examination:   External labs:  Labs 01/17/2023:  Hb 14.0/HCT 41.9, platelets 106, normal indicis.  Serum glucose 1 1 9  mg, BUN 22, creatinine 1.14, EGFR 64 mL, potassium 4.0.  LFTs normal.  Total cholesterol 111, triglycerides 80, HDL 52, LDL 43.  Allergies   Allergies  Allergen Reactions   Brilinta [Ticagrelor] Anaphylaxis and Shortness Of Breath    Difficulty breathing      Final Medications at End of Visit     Current Outpatient Medications:    dapagliflozin propanediol (FARXIGA) 10 MG TABS tablet, Take 10 mg by mouth daily., Disp: , Rfl:    dorzolamide-timolol (COSOPT) 22.3-6.8 MG/ML ophthalmic solution, Place 1 drop into both eyes 2 (two) times daily., Disp: , Rfl: 10   ezetimibe (ZETIA) 10 MG tablet, TAKE 1 TABLET BY MOUTH EVERY DAY AFTER SUPPER, Disp: 30 tablet, Rfl: 2   hydrocortisone cream 1  %, Apply 1 application topically 2 (two) times daily as needed for itching., Disp: , Rfl:    isosorbide mononitrate (IMDUR) 30 MG 24 hr tablet, TAKE 1 TABLET(30 MG) BY MOUTH DAILY, Disp: 100 tablet, Rfl: 3   latanoprost (XALATAN) 0.005 % ophthalmic solution, Place 1 drop into both eyes at bedtime., Disp: , Rfl: 12   losartan (COZAAR) 25 MG tablet, Take 1 tablet (25 mg total) by mouth daily., Disp: 90 tablet, Rfl: 3   metFORMIN (GLUCOPHAGE-XR) 500 MG 24 hr tablet, Take by mouth. 1 tablet in the morning and 2 tablets in the evening, Disp: , Rfl:    nitroGLYCERIN (NITROSTAT) 0.4 MG SL tablet, Place 1 tablet (0.4 mg total) under the tongue every 5 (five) minutes as needed for chest pain., Disp: 25 tablet, Rfl: 3   polyethylene glycol (MIRALAX) 17 g packet, Take 17 g by mouth daily., Disp: 14 each, Rfl: 0   rivaroxaban (XARELTO) 20 MG TABS tablet, Take 20 mg by mouth daily with supper., Disp: , Rfl:    rosuvastatin (CRESTOR) 20 MG tablet, Take 20 mg by mouth at bedtime., Disp: , Rfl:    traZODone (DESYREL) 50 MG tablet, Take 50 mg by mouth at bedtime as needed., Disp: , Rfl:    TRUE METRIX BLOOD GLUCOSE TEST test strip, 2 (two) times daily., Disp: , Rfl:    vitamin B-12 (CYANOCOBALAMIN) 1000 MCG tablet, Take 1,000 mcg by mouth daily., Disp: , Rfl:    VITAMIN D, CHOLECALCIFEROL, PO, Take 10,000 Units by mouth daily., Disp: , Rfl:    Radiology:   CXR 04/02/2020: Left pacer remains in place, unchanged. Heart is normal size. No confluent opacities or effusions. No acute bony abnormality.  Cardiac Studies:   Lexiscan/modified Bruce Tetrofosmin stress test 02/06/2020: Lexiscan/modified Bruce nuclear stress test performed using 1-day protocol. Stress EKG is non-diagnostic, as this is pharmacological stress test. In addition, stress EKG at 110% MPHR showed AV paced rhythm.  SPECT images show medium sized, mild intensity, partially reversible perfusion defect in apical to basal inferior and basal inferoseptal  myocardium. While septal perfusion defect may be due to paced rhythm, ischemia cannot be excluded. Recommend clinical correlation.  Stress LVEF 44%. High risk study. No significant change from 02/14/2017.   Echocardiogram 06/23/2021:  Normal LV systolic function with visual EF 55-60%. Left ventricle cavity  is normal in size. Moderate left ventricular hypertrophy. Normal global  wall motion. Normal diastolic filling pattern, normal LAP.  Mild to moderate aortic regurgitation.  Mild (Grade I) mitral regurgitation.  Mild tricuspid regurgitation. No evidence of pulmonary hypertension.  Compared to study 02/06/2020 diastolic function improved (indeterminate to normal), moderate TR is now mild, Aortic root was 40mm to 37mm).   Coronary angiography 06/30/2021: RCA: Long CTO.  He has ipsilateral collaterals as contralateral collaterals from the LAD. LM: Large vessel.  Mildly calcified. LAD: Ostial 40 to 50% stenosis, diffuse.  Continues as a large D1 as the LAD is occluded in the proximal segment.   LIMA to LAD is widely RI: Moderate caliber vessel, ostial 40 to 50% stenosis, calcific. CX: 2.5 x 15 mm resolute Onyx DES placed 11/29/2017 in the ostium of the CX is widely patent.  CX gives origin to large OM1 and OM 2.  There is mild diffuse disease. LV: Mild inferior hypokinesis, preserved LVEF at 50 to 55%.  No significant MR. Hemodynamics: LV 93/0, EDP 7 mmHg.  Ao 89/41, mean 60 mmHg.  There was no pressure gradient across the aortic valve.   Recommendation: Coronary anatomy is not significantly changed from 2019.  He has small vessel disease, continue medical therapy as indicated.  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  70 mL contrast utilized.  Pacemaker Medtronic dual chamber AZURE XT DR MRI, Gen change 06/17/2021   Remote pacemaker transmission 03/24/2023: Longevity 10 years and 9 months.  AP 18 %, VP 94 %.  Lead impedance and thresholds are normal.  No high rate  episodes.  Normal dual-chamber pacemaker function.  EKG   EKG 04/04/2023: Normal sinus rhythm with rate of 77 bpm, left atrial enlargement, ventricularly paced rhythm.  No further analysis.  Single PAC.  Compared to 09/30/2022, no significant change.  Assessment     ICD-10-CM   1. Coronary artery disease of native artery of native heart with stable angina pectoris (HCC)  I25.118 EKG 12-Lead    losartan (COZAAR) 25 MG tablet    2. PAF (paroxysmal atrial fibrillation) (HCC). CHA2DS2-VASc Score is 5 (A, HTN, DM Vasc Dz). Yearly risk of stroke 6.7%  I48.0     3. Sick sinus syndrome (HCC)  I49.5     4. Mixed hyperlipidemia  E78.2     5. Primary hypertension  I10 losartan (COZAAR) 25 MG tablet      Meds ordered this encounter  Medications   losartan (COZAAR) 25 MG tablet    Sig: Take 1 tablet (25 mg total) by mouth daily.    Dispense:  90 tablet    Refill:  3    Medications Discontinued During This Encounter  Medication Reason   Biotin 5000 MCG TABS     Recommendations:   Jesse [name]  is a 82 y.o. male  with coronary artery disease and CABG in 2007 in [place] with LIMA to LAD, SVG to OM which is occluded by angiography on 02/04/2015, occluded SVG to RCA. He underwent complex successful arthrectomy and PCI to Cx on 11/29/2017.  His past medical history also includes sick sinus syndrome status post Medtronic pacemaker implantation 2011, generator change out 06/17/2021, PAF, diabetes mellitus, hypertension and hyperlipidemia.  1. Coronary artery disease of native artery of native heart with stable angina pectoris [facility]) Patient presents here for month office visit, fortunately he has remained stable with very minimal symptoms of angina occasionally, states that over the last 6 months due to reduced physical activity due to leg weakness from spinal stenosis, he has not had any recurrence of angina.  He  has not used any sublingual nitroglycerin.  Reviewed his lipids, well-controlled.  He is  presently on metoprolol, Imdur and Farxiga.  - EKG 12-Lead  2. PAF (paroxysmal atrial fibrillation) (HCC). CHA2DS2-VASc Score is 5 (A, HTN, DM Vasc Dz). Yearly risk of stroke 6.7% He is maintaining sinus rhythm.  Reviewed his pacemaker data.  Presently on anticoagulation with Xarelto 20 mg daily, continue the same.  3. Sick sinus syndrome [facility]) Patient has sinus node dysfunction and also high degree AV block, presently has a dual-chamber pacemaker, he has not had any dizziness or syncope.  4. Mixed hyperlipidemia Reviewed his labs, lipids are well-controlled.  He is on high intensity Crestor 20 mg daily, continue the same.  I will see him back in 6 months for follow-up.  He could be seen on annual basis above patient request that I see him back in 6 months.  Other orders - traZODone (DESYREL) 50 MG tablet; Take 50 mg by mouth at bedtime as needed.  Patient was previously on losartan which was discontinued as he had lost significant amount of weight while he was going through dental work.  His weight has now stabilized, I am starting him back on losartan 25 mg daily.    Jesse Decamp, MD, [facility] 04/04/2023, 2:29 PM Office: [phone] Fax: [phone] Pager: [phone]

## 2023-05-19 DIAGNOSIS — Z23 Encounter for immunization: Secondary | ICD-10-CM | POA: Diagnosis not present

## 2023-06-14 ENCOUNTER — Ambulatory Visit (INDEPENDENT_AMBULATORY_CARE_PROVIDER_SITE_OTHER): Payer: Medicare HMO

## 2023-06-14 DIAGNOSIS — I495 Sick sinus syndrome: Secondary | ICD-10-CM

## 2023-06-14 LAB — CUP PACEART REMOTE DEVICE CHECK
Battery Remaining Longevity: 127 mo
Battery Voltage: 3.02 V
Brady Statistic AP VP Percent: 17.55 %
Brady Statistic AP VS Percent: 0 %
Brady Statistic AS VP Percent: 77.77 %
Brady Statistic AS VS Percent: 4.68 %
Brady Statistic RA Percent Paced: 19.22 %
Brady Statistic RV Percent Paced: 95.32 %
Date Time Interrogation Session: 20241119033331
Implantable Lead Connection Status: 753985
Implantable Lead Connection Status: 753985
Implantable Lead Implant Date: 20110428
Implantable Lead Implant Date: 20110428
Implantable Lead Location: 753859
Implantable Lead Location: 753860
Implantable Lead Model: 5076
Implantable Lead Model: 5076
Implantable Pulse Generator Implant Date: 20221123
Lead Channel Impedance Value: 361 Ohm
Lead Channel Impedance Value: 361 Ohm
Lead Channel Impedance Value: 418 Ohm
Lead Channel Impedance Value: 456 Ohm
Lead Channel Pacing Threshold Amplitude: 0.625 V
Lead Channel Pacing Threshold Amplitude: 0.625 V
Lead Channel Pacing Threshold Pulse Width: 0.4 ms
Lead Channel Pacing Threshold Pulse Width: 0.4 ms
Lead Channel Sensing Intrinsic Amplitude: 10.125 mV
Lead Channel Sensing Intrinsic Amplitude: 10.125 mV
Lead Channel Sensing Intrinsic Amplitude: 3.125 mV
Lead Channel Sensing Intrinsic Amplitude: 3.125 mV
Lead Channel Setting Pacing Amplitude: 1.5 V
Lead Channel Setting Pacing Amplitude: 2 V
Lead Channel Setting Pacing Pulse Width: 0.4 ms
Lead Channel Setting Sensing Sensitivity: 5.6 mV
Zone Setting Status: 755011
Zone Setting Status: 755011

## 2023-07-11 NOTE — Progress Notes (Signed)
Remote pacemaker transmission.   

## 2023-09-13 ENCOUNTER — Ambulatory Visit (INDEPENDENT_AMBULATORY_CARE_PROVIDER_SITE_OTHER): Payer: Medicare HMO

## 2023-09-13 DIAGNOSIS — I495 Sick sinus syndrome: Secondary | ICD-10-CM | POA: Diagnosis not present

## 2023-09-15 LAB — CUP PACEART REMOTE DEVICE CHECK
Battery Remaining Longevity: 121 mo
Battery Voltage: 3.01 V
Brady Statistic AP VP Percent: 13.79 %
Brady Statistic AP VS Percent: 0 %
Brady Statistic AS VP Percent: 84.34 %
Brady Statistic AS VS Percent: 1.87 %
Brady Statistic RA Percent Paced: 14.75 %
Brady Statistic RV Percent Paced: 98.13 %
Date Time Interrogation Session: 20250220092152
Implantable Lead Connection Status: 753985
Implantable Lead Connection Status: 753985
Implantable Lead Implant Date: 20110428
Implantable Lead Implant Date: 20110428
Implantable Lead Location: 753859
Implantable Lead Location: 753860
Implantable Lead Model: 5076
Implantable Lead Model: 5076
Implantable Pulse Generator Implant Date: 20221123
Lead Channel Impedance Value: 342 Ohm
Lead Channel Impedance Value: 361 Ohm
Lead Channel Impedance Value: 380 Ohm
Lead Channel Impedance Value: 437 Ohm
Lead Channel Pacing Threshold Amplitude: 0.625 V
Lead Channel Pacing Threshold Amplitude: 0.625 V
Lead Channel Pacing Threshold Pulse Width: 0.4 ms
Lead Channel Pacing Threshold Pulse Width: 0.4 ms
Lead Channel Sensing Intrinsic Amplitude: 11.75 mV
Lead Channel Sensing Intrinsic Amplitude: 11.75 mV
Lead Channel Sensing Intrinsic Amplitude: 3.25 mV
Lead Channel Sensing Intrinsic Amplitude: 3.25 mV
Lead Channel Setting Pacing Amplitude: 1.5 V
Lead Channel Setting Pacing Amplitude: 2 V
Lead Channel Setting Pacing Pulse Width: 0.4 ms
Lead Channel Setting Sensing Sensitivity: 5.6 mV
Zone Setting Status: 755011
Zone Setting Status: 755011

## 2023-09-26 ENCOUNTER — Encounter: Payer: Self-pay | Admitting: Cardiology

## 2023-09-26 ENCOUNTER — Ambulatory Visit: Payer: Medicare HMO | Attending: Cardiology | Admitting: Cardiology

## 2023-09-26 VITALS — BP 122/70 | HR 81 | Resp 16 | Ht 74.0 in | Wt 162.2 lb

## 2023-09-26 DIAGNOSIS — I25118 Atherosclerotic heart disease of native coronary artery with other forms of angina pectoris: Secondary | ICD-10-CM

## 2023-09-26 DIAGNOSIS — I495 Sick sinus syndrome: Secondary | ICD-10-CM | POA: Diagnosis not present

## 2023-09-26 DIAGNOSIS — I480 Paroxysmal atrial fibrillation: Secondary | ICD-10-CM

## 2023-09-26 NOTE — Patient Instructions (Signed)
 Medication Instructions:  Your physician recommends that you continue on your current medications as directed. Please refer to the Current Medication list given to you today.  *If you need a refill on your cardiac medications before your next appointment, please call your pharmacy*   Lab Work: none If you have labs (blood work) drawn today and your tests are completely normal, you will receive your results only by: MyChart Message (if you have MyChart) OR A paper copy in the mail If you have any lab test that is abnormal or we need to change your treatment, we will call you to review the results.   Testing/Procedures: none   Follow-Up: At [facility], you and your health needs are our priority.  As part of our continuing mission to provide you with exceptional heart care, we have created designated Provider Care Teams.  These Care Teams include your primary Cardiologist (physician) and Advanced Practice Providers (APPs -  Physician Assistants and Nurse Practitioners) who all work together to provide you with the care you need, when you need it.  We recommend signing up for the patient portal called "MyChart".  Sign up information is provided on this After Visit Summary.  MyChart is used to connect with patients for Virtual Visits (Telemedicine).  Patients are able to view lab/test results, encounter notes, upcoming appointments, etc.  Non-urgent messages can be sent to your provider as well.   To learn more about what you can do with MyChart, go to [email or web address].    Your next appointment:   6 month(s)  Provider:   Yates Decamp, MD     Other Instructions

## 2023-09-26 NOTE — Progress Notes (Signed)
 Cardiology Office Note:  .   Date:  09/26/2023  ID:  Jesse [name], DOB 01/12/1941, MRN [id number] PCP: Merri Brunette, MD  [facility] Providers Cardiologist:  Yates Decamp, MD   History of Present Illness: .   Jesse [name] is a 83 y.o.  male  with coronary artery disease and CABG in 2007 in [place] with LIMA to LAD, SVG to OM which is occluded by angiography on 02/04/2015, occluded SVG to RCA. He underwent complex successful arthrectomy and PCI to Cx on 11/29/2017.    His past medical history also includes sick sinus syndrome status post [employer] pacemaker implantation 2011, generator change out 06/17/2021, PAF, diabetes mellitus, hypertension and hyperlipidemia.  Discussed the use of AI scribe software for clinical note transcription with the patient, who gave verbal consent to proceed.  History of Present Illness   The patient, with a history of spine issues, presents with persistent weakness in his hands and arms. He reports that his condition has slightly improved over the past two years, but he continues to struggle with his strength. He also mentions experiencing neuropathy in both legs. Despite these issues, he reports no concerns with his heart and has stopped taking losartan due to shortness of breath. He has also lost weight, which has helped to control his blood pressure. He has a history of diabetes and was previously on blood pressure medication.      Labs    Lab Results  Component Value Date   NA 137 04/24/2022   K 4.3 04/24/2022   CO2 21 (L) 04/24/2022   GLUCOSE 119 (H) 04/24/2022   BUN 29 (H) 04/24/2022   CREATININE 0.99 04/24/2022   CALCIUM 9.7 04/24/2022   EGFR 57 (L) 06/15/2021   GFRNONAA >60 04/24/2022      Latest Ref Rng & Units 04/24/2022    5:26 PM 04/24/2022    3:17 PM 04/16/2022    6:07 AM  BMP  Glucose 70 - 99 mg/dL  259  563   BUN 8 - 23 mg/dL  29  22   Creatinine 8.75 - 1.24 mg/dL  6.43  3.29   Sodium 518 - 145 mmol/L  137  135   Potassium  3.5 - 5.1 mmol/L 4.3  5.7  3.6   Chloride 98 - 111 mmol/L  102  103   CO2 22 - 32 mmol/L  21  23   Calcium 8.9 - 10.3 mg/dL  9.7  9.6       Latest Ref Rng & Units 04/24/2022    3:17 PM 04/13/2022   12:30 PM 06/15/2021   10:10 AM  CBC  WBC 4.0 - 10.5 K/uL 5.3  3.4  6.7   Hemoglobin 13.0 - 17.0 g/dL 84.1  66.0  63.0   Hematocrit 39.0 - 52.0 % 35.6  38.4  40.4   Platelets 150 - 400 K/uL 142  134  145    Lab Results  Component Value Date   HGBA1C 5.7 (H) 04/16/2022     External Labs:  PCP [facility] labs 01/13/2023:  Total cholesterol 111, triglycerides 80, HDL 52, LDL 43.  TSH 08/03/2021: 3.090.  PCP fax labs 11/15/2022:  Hb 13.2/HCT 39.4, platelets 100 20K.  BUN 26, creatinine 1.16, LFTs normal except for minimally elevated serum bilirubin at 1.3, EGFR 76 mL.  Review of Systems  Cardiovascular:  Negative for chest pain, dyspnea on exertion and leg swelling.   Physical Exam:   VS:  BP 122/70 (BP Location: Left Arm, Patient  Position: Sitting, Cuff Size: Normal)   Pulse 81   Resp 16   Ht 6\' 2"  (1.88 m)   Wt 162 lb 3.2 oz (73.6 kg)   SpO2 97%   BMI 20.83 kg/m    Wt Readings from Last 3 Encounters:  09/26/23 162 lb 3.2 oz (73.6 kg)  04/04/23 156 lb (70.8 kg)  09/30/22 152 lb (68.9 kg)    Physical Exam Neck:     Vascular: No carotid bruit or JVD.  Cardiovascular:     Rate and Rhythm: Normal rate and regular rhythm.     Pulses: Intact distal pulses.     Heart sounds: Normal heart sounds. No murmur heard.    No gallop.  Pulmonary:     Effort: Pulmonary effort is normal.     Breath sounds: Normal breath sounds.  Abdominal:     General: Bowel sounds are normal.     Palpations: Abdomen is soft.  Musculoskeletal:     Right lower leg: No edema.     Left lower leg: No edema.    Studies Reviewed: .     Echocardiogram 06/23/2021: Normal LV systolic function with visual EF 55-60%. Left ventricle cavity is normal in size. Moderate left ventricular hypertrophy. Normal global  wall motion. Normal diastolic filling pattern, normal LAP. Mild to moderate aortic regurgitation. Mild (Grade I) mitral regurgitation. Mild tricuspid regurgitation. No evidence of pulmonary hypertension. Compared to study 02/06/2020 diastolic function improved (indeterminate to normal), moderate TR is now mild, Aortic root was 40mm to 37mm).  Coronary angiography 06/30/2021: CX: 2.5 x 15 mm resolute Onyx DES placed 11/29/2017 in the ostium of the CX is widely patent.  LV: Mild inferior hypokinesis, preserved LVEF at 50 to 55%.  No significant MR.     Remote dual-chamber pacemaker transmission 06/14/2023: ABIs 20%, V-paced 100%.  Battery life 10 years and 7 months.  EKG:   Medications and allergies    Allergies  Allergen Reactions   Brilinta [Ticagrelor] Anaphylaxis and Shortness Of Breath    Difficulty breathing      Current Outpatient Medications:    dapagliflozin propanediol (FARXIGA) 10 MG TABS tablet, Take 10 mg by mouth daily., Disp: , Rfl:    dorzolamide-timolol (COSOPT) 22.3-6.8 MG/ML ophthalmic solution, Place 1 drop into both eyes 2 (two) times daily., Disp: , Rfl: 10   ezetimibe (ZETIA) 10 MG tablet, TAKE 1 TABLET BY MOUTH EVERY DAY AFTER SUPPER, Disp: 30 tablet, Rfl: 2   hydrocortisone cream 1 %, Apply 1 application topically 2 (two) times daily as needed for itching., Disp: , Rfl:    isosorbide mononitrate (IMDUR) 30 MG 24 hr tablet, TAKE 1 TABLET(30 MG) BY MOUTH DAILY, Disp: 100 tablet, Rfl: 3   latanoprost (XALATAN) 0.005 % ophthalmic solution, Place 1 drop into both eyes at bedtime., Disp: , Rfl: 12   metFORMIN (GLUCOPHAGE-XR) 500 MG 24 hr tablet, Take by mouth. 1 tablet in the morning and 2 tablets in the evening, Disp: , Rfl:    nitroGLYCERIN (NITROSTAT) 0.4 MG SL tablet, Place 1 tablet (0.4 mg total) under the tongue every 5 (five) minutes as needed for chest pain., Disp: 25 tablet, Rfl: 3   polyethylene glycol (MIRALAX) 17 g packet, Take 17 g by mouth daily., Disp:  14 each, Rfl: 0   rivaroxaban (XARELTO) 20 MG TABS tablet, Take 20 mg by mouth daily with supper., Disp: , Rfl:    rosuvastatin (CRESTOR) 20 MG tablet, Take 20 mg by mouth at bedtime., Disp: , Rfl:  traZODone (DESYREL) 50 MG tablet, Take 50 mg by mouth at bedtime as needed., Disp: , Rfl:    TRUE METRIX BLOOD GLUCOSE TEST test strip, 2 (two) times daily., Disp: , Rfl:    vitamin B-12 (CYANOCOBALAMIN) 1000 MCG tablet, Take 1,000 mcg by mouth daily., Disp: , Rfl:    VITAMIN D, CHOLECALCIFEROL, PO, Take 10,000 Units by mouth daily., Disp: , Rfl:    ASSESSMENT AND PLAN: .      ICD-10-CM   1. Coronary artery disease of native artery of native heart with stable angina pectoris (HCC)  I25.118     2. Sick sinus syndrome (HCC)  I49.5     3. Paroxysmal atrial fibrillation (HCC)  I48.0      Click Here to Calculate/Change CHADS2VASc Score The patient's CHADS2-VASc score is 4, indicating a 4.8% annual risk of stroke.  Therefore, anticoagulation is recommended.   CHF History: No HTN History: No Diabetes History: Yes Stroke History: No Vascular Disease History: Yes   Assessment and Plan    Coronary Artery Disease Coronary artery disease is stable with no chest pain and no use of nitroglycerin. Cholesterol is well-controlled with Crestor 20 mg daily and ezetimibe 10 mg daily. Continue Crestor, ezetimibe, and isosorbide mononitrate 30 mg daily.  Atrial Fibrillation Atrial fibrillation is managed with Xarelto 20 mg daily. There are no bleeding issues or abnormal stools, and hemoglobin levels are stable. Continue Xarelto 20 mg daily.  Sinus Node Dysfunction with Pacemaker Sinus node dysfunction is managed with a pacemaker, which is functioning well and monitored by EP specialists. Continue monitoring by EP specialists.  He is essentially pacer dependent and was V pacing 100% of the time, last interrogation reviewed with the patient.  Diabetes Mellitus Diabetes mellitus management includes  restarting losartan 25 mg for renal and ocular protection, as it was previously stopped due to dyspnea. There is no allergy to losartan.  Patient previously was hypertensive but with weight loss his blood pressure has become normal.  CHA2DS2-VASc rescore updated.  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 Monitor neuropathy symptoms.  General Health Maintenance Blood pressure is well-controlled without medications, though weight fluctuations are noted. Monitor blood pressure regularly.  Follow-up Schedule a follow-up appointment in summer 2025.    Signed,  Yates Decamp, MD, [facility] 09/26/2023, 11:47 AM [facility] [address] [place], [place] [address] Phone: [phone]. Fax:  [phone]

## 2023-10-03 ENCOUNTER — Ambulatory Visit: Payer: Self-pay | Admitting: Cardiology

## 2023-10-20 NOTE — Progress Notes (Signed)
 Remote pacemaker transmission.

## 2023-10-20 NOTE — Addendum Note (Signed)
 Addended by: Elease Etienne A on: 10/20/2023 02:03 PM   Modules accepted: Orders

## 2023-11-10 ENCOUNTER — Other Ambulatory Visit: Payer: Self-pay | Admitting: Cardiology

## 2023-11-10 DIAGNOSIS — I25118 Atherosclerotic heart disease of native coronary artery with other forms of angina pectoris: Secondary | ICD-10-CM

## 2023-12-13 ENCOUNTER — Ambulatory Visit (INDEPENDENT_AMBULATORY_CARE_PROVIDER_SITE_OTHER): Payer: Medicare HMO

## 2023-12-13 DIAGNOSIS — I495 Sick sinus syndrome: Secondary | ICD-10-CM

## 2023-12-14 ENCOUNTER — Ambulatory Visit: Payer: Self-pay | Admitting: Internal Medicine

## 2023-12-14 LAB — CUP PACEART REMOTE DEVICE CHECK
Battery Remaining Longevity: 120 mo
Battery Voltage: 3.01 V
Brady Statistic AP VP Percent: 16.69 %
Brady Statistic AP VS Percent: 0.11 %
Brady Statistic AS VP Percent: 80.14 %
Brady Statistic AS VS Percent: 3.06 %
Brady Statistic RA Percent Paced: 17.36 %
Brady Statistic RV Percent Paced: 96.83 %
Date Time Interrogation Session: 20250520031459
Implantable Lead Connection Status: 753985
Implantable Lead Connection Status: 753985
Implantable Lead Implant Date: 20110428
Implantable Lead Implant Date: 20110428
Implantable Lead Location: 753859
Implantable Lead Location: 753860
Implantable Lead Model: 5076
Implantable Lead Model: 5076
Implantable Pulse Generator Implant Date: 20221123
Lead Channel Impedance Value: 342 Ohm
Lead Channel Impedance Value: 380 Ohm
Lead Channel Impedance Value: 399 Ohm
Lead Channel Impedance Value: 456 Ohm
Lead Channel Pacing Threshold Amplitude: 0.625 V
Lead Channel Pacing Threshold Amplitude: 0.75 V
Lead Channel Pacing Threshold Pulse Width: 0.4 ms
Lead Channel Pacing Threshold Pulse Width: 0.4 ms
Lead Channel Sensing Intrinsic Amplitude: 11.75 mV
Lead Channel Sensing Intrinsic Amplitude: 11.75 mV
Lead Channel Sensing Intrinsic Amplitude: 3.5 mV
Lead Channel Sensing Intrinsic Amplitude: 3.5 mV
Lead Channel Setting Pacing Amplitude: 1.5 V
Lead Channel Setting Pacing Amplitude: 2 V
Lead Channel Setting Pacing Pulse Width: 0.4 ms
Lead Channel Setting Sensing Sensitivity: 5.6 mV
Zone Setting Status: 755011
Zone Setting Status: 755011

## 2024-01-18 DIAGNOSIS — I10 Essential (primary) hypertension: Secondary | ICD-10-CM | POA: Diagnosis not present

## 2024-01-18 DIAGNOSIS — D696 Thrombocytopenia, unspecified: Secondary | ICD-10-CM | POA: Diagnosis not present

## 2024-01-18 DIAGNOSIS — E118 Type 2 diabetes mellitus with unspecified complications: Secondary | ICD-10-CM | POA: Diagnosis not present

## 2024-01-23 DIAGNOSIS — I25119 Atherosclerotic heart disease of native coronary artery with unspecified angina pectoris: Secondary | ICD-10-CM | POA: Diagnosis not present

## 2024-01-23 DIAGNOSIS — Z951 Presence of aortocoronary bypass graft: Secondary | ICD-10-CM | POA: Diagnosis not present

## 2024-01-23 DIAGNOSIS — D696 Thrombocytopenia, unspecified: Secondary | ICD-10-CM | POA: Diagnosis not present

## 2024-01-23 DIAGNOSIS — E118 Type 2 diabetes mellitus with unspecified complications: Secondary | ICD-10-CM | POA: Diagnosis not present

## 2024-01-23 DIAGNOSIS — E782 Mixed hyperlipidemia: Secondary | ICD-10-CM | POA: Diagnosis not present

## 2024-01-23 DIAGNOSIS — I480 Paroxysmal atrial fibrillation: Secondary | ICD-10-CM | POA: Diagnosis not present

## 2024-01-23 DIAGNOSIS — D6869 Other thrombophilia: Secondary | ICD-10-CM | POA: Diagnosis not present

## 2024-01-23 DIAGNOSIS — Z0000 Encounter for general adult medical examination without abnormal findings: Secondary | ICD-10-CM | POA: Diagnosis not present

## 2024-01-23 DIAGNOSIS — I517 Cardiomegaly: Secondary | ICD-10-CM | POA: Diagnosis not present

## 2024-01-26 DIAGNOSIS — R269 Unspecified abnormalities of gait and mobility: Secondary | ICD-10-CM | POA: Diagnosis not present

## 2024-01-26 DIAGNOSIS — G6182 Multifocal motor neuropathy: Secondary | ICD-10-CM | POA: Diagnosis not present

## 2024-01-26 DIAGNOSIS — S86112D Strain of other muscle(s) and tendon(s) of posterior muscle group at lower leg level, left leg, subsequent encounter: Secondary | ICD-10-CM | POA: Diagnosis not present

## 2024-01-26 DIAGNOSIS — S86111S Strain of other muscle(s) and tendon(s) of posterior muscle group at lower leg level, right leg, sequela: Secondary | ICD-10-CM | POA: Diagnosis not present

## 2024-02-02 DIAGNOSIS — R269 Unspecified abnormalities of gait and mobility: Secondary | ICD-10-CM | POA: Diagnosis not present

## 2024-02-02 DIAGNOSIS — G6182 Multifocal motor neuropathy: Secondary | ICD-10-CM | POA: Diagnosis not present

## 2024-02-02 DIAGNOSIS — S86112D Strain of other muscle(s) and tendon(s) of posterior muscle group at lower leg level, left leg, subsequent encounter: Secondary | ICD-10-CM | POA: Diagnosis not present

## 2024-02-02 DIAGNOSIS — S86111S Strain of other muscle(s) and tendon(s) of posterior muscle group at lower leg level, right leg, sequela: Secondary | ICD-10-CM | POA: Diagnosis not present

## 2024-02-06 DIAGNOSIS — R269 Unspecified abnormalities of gait and mobility: Secondary | ICD-10-CM | POA: Diagnosis not present

## 2024-02-06 DIAGNOSIS — S86111S Strain of other muscle(s) and tendon(s) of posterior muscle group at lower leg level, right leg, sequela: Secondary | ICD-10-CM | POA: Diagnosis not present

## 2024-02-06 DIAGNOSIS — S86112D Strain of other muscle(s) and tendon(s) of posterior muscle group at lower leg level, left leg, subsequent encounter: Secondary | ICD-10-CM | POA: Diagnosis not present

## 2024-02-06 DIAGNOSIS — G6182 Multifocal motor neuropathy: Secondary | ICD-10-CM | POA: Diagnosis not present

## 2024-02-07 NOTE — Addendum Note (Signed)
 Addended by: VICCI SELLER A on: 02/07/2024 10:34 AM   Modules accepted: Orders

## 2024-02-07 NOTE — Progress Notes (Signed)
 Remote pacemaker transmission.

## 2024-02-09 DIAGNOSIS — G6182 Multifocal motor neuropathy: Secondary | ICD-10-CM | POA: Diagnosis not present

## 2024-02-09 DIAGNOSIS — S86112D Strain of other muscle(s) and tendon(s) of posterior muscle group at lower leg level, left leg, subsequent encounter: Secondary | ICD-10-CM | POA: Diagnosis not present

## 2024-02-09 DIAGNOSIS — R269 Unspecified abnormalities of gait and mobility: Secondary | ICD-10-CM | POA: Diagnosis not present

## 2024-02-09 DIAGNOSIS — S86111S Strain of other muscle(s) and tendon(s) of posterior muscle group at lower leg level, right leg, sequela: Secondary | ICD-10-CM | POA: Diagnosis not present

## 2024-02-13 DIAGNOSIS — S86112D Strain of other muscle(s) and tendon(s) of posterior muscle group at lower leg level, left leg, subsequent encounter: Secondary | ICD-10-CM | POA: Diagnosis not present

## 2024-02-13 DIAGNOSIS — G6182 Multifocal motor neuropathy: Secondary | ICD-10-CM | POA: Diagnosis not present

## 2024-02-13 DIAGNOSIS — R269 Unspecified abnormalities of gait and mobility: Secondary | ICD-10-CM | POA: Diagnosis not present

## 2024-02-13 DIAGNOSIS — S86111S Strain of other muscle(s) and tendon(s) of posterior muscle group at lower leg level, right leg, sequela: Secondary | ICD-10-CM | POA: Diagnosis not present

## 2024-02-16 DIAGNOSIS — S86112D Strain of other muscle(s) and tendon(s) of posterior muscle group at lower leg level, left leg, subsequent encounter: Secondary | ICD-10-CM | POA: Diagnosis not present

## 2024-02-16 DIAGNOSIS — G6182 Multifocal motor neuropathy: Secondary | ICD-10-CM | POA: Diagnosis not present

## 2024-02-16 DIAGNOSIS — S86111S Strain of other muscle(s) and tendon(s) of posterior muscle group at lower leg level, right leg, sequela: Secondary | ICD-10-CM | POA: Diagnosis not present

## 2024-02-16 DIAGNOSIS — R269 Unspecified abnormalities of gait and mobility: Secondary | ICD-10-CM | POA: Diagnosis not present

## 2024-02-23 DIAGNOSIS — R269 Unspecified abnormalities of gait and mobility: Secondary | ICD-10-CM | POA: Diagnosis not present

## 2024-02-23 DIAGNOSIS — G6182 Multifocal motor neuropathy: Secondary | ICD-10-CM | POA: Diagnosis not present

## 2024-02-23 DIAGNOSIS — S86112D Strain of other muscle(s) and tendon(s) of posterior muscle group at lower leg level, left leg, subsequent encounter: Secondary | ICD-10-CM | POA: Diagnosis not present

## 2024-02-23 DIAGNOSIS — S86111S Strain of other muscle(s) and tendon(s) of posterior muscle group at lower leg level, right leg, sequela: Secondary | ICD-10-CM | POA: Diagnosis not present

## 2024-03-01 DIAGNOSIS — G6182 Multifocal motor neuropathy: Secondary | ICD-10-CM | POA: Diagnosis not present

## 2024-03-01 DIAGNOSIS — R269 Unspecified abnormalities of gait and mobility: Secondary | ICD-10-CM | POA: Diagnosis not present

## 2024-03-01 DIAGNOSIS — S86112D Strain of other muscle(s) and tendon(s) of posterior muscle group at lower leg level, left leg, subsequent encounter: Secondary | ICD-10-CM | POA: Diagnosis not present

## 2024-03-01 DIAGNOSIS — S86111S Strain of other muscle(s) and tendon(s) of posterior muscle group at lower leg level, right leg, sequela: Secondary | ICD-10-CM | POA: Diagnosis not present

## 2024-03-05 DIAGNOSIS — S86112D Strain of other muscle(s) and tendon(s) of posterior muscle group at lower leg level, left leg, subsequent encounter: Secondary | ICD-10-CM | POA: Diagnosis not present

## 2024-03-05 DIAGNOSIS — S86111S Strain of other muscle(s) and tendon(s) of posterior muscle group at lower leg level, right leg, sequela: Secondary | ICD-10-CM | POA: Diagnosis not present

## 2024-03-05 DIAGNOSIS — G6182 Multifocal motor neuropathy: Secondary | ICD-10-CM | POA: Diagnosis not present

## 2024-03-05 DIAGNOSIS — R269 Unspecified abnormalities of gait and mobility: Secondary | ICD-10-CM | POA: Diagnosis not present

## 2024-03-13 ENCOUNTER — Ambulatory Visit: Payer: Medicare HMO | Attending: Cardiology

## 2024-03-13 DIAGNOSIS — I495 Sick sinus syndrome: Secondary | ICD-10-CM | POA: Diagnosis not present

## 2024-03-15 LAB — CUP PACEART REMOTE DEVICE CHECK
Battery Remaining Longevity: 115 mo
Battery Voltage: 3.01 V
Brady Statistic AP VP Percent: 16.86 %
Brady Statistic AP VS Percent: 0 %
Brady Statistic AS VP Percent: 82 %
Brady Statistic AS VS Percent: 1.14 %
Brady Statistic RA Percent Paced: 17.34 %
Brady Statistic RV Percent Paced: 98.86 %
Date Time Interrogation Session: 20250818210109
Implantable Lead Connection Status: 753985
Implantable Lead Connection Status: 753985
Implantable Lead Implant Date: 20110428
Implantable Lead Implant Date: 20110428
Implantable Lead Location: 753859
Implantable Lead Location: 753860
Implantable Lead Model: 5076
Implantable Lead Model: 5076
Implantable Pulse Generator Implant Date: 20221123
Lead Channel Impedance Value: 342 Ohm
Lead Channel Impedance Value: 361 Ohm
Lead Channel Impedance Value: 380 Ohm
Lead Channel Impedance Value: 437 Ohm
Lead Channel Pacing Threshold Amplitude: 0.625 V
Lead Channel Pacing Threshold Amplitude: 0.75 V
Lead Channel Pacing Threshold Pulse Width: 0.4 ms
Lead Channel Pacing Threshold Pulse Width: 0.4 ms
Lead Channel Sensing Intrinsic Amplitude: 17.5 mV
Lead Channel Sensing Intrinsic Amplitude: 17.5 mV
Lead Channel Sensing Intrinsic Amplitude: 3.25 mV
Lead Channel Sensing Intrinsic Amplitude: 3.25 mV
Lead Channel Setting Pacing Amplitude: 1.5 V
Lead Channel Setting Pacing Amplitude: 2 V
Lead Channel Setting Pacing Pulse Width: 0.4 ms
Lead Channel Setting Sensing Sensitivity: 5.6 mV
Zone Setting Status: 755011
Zone Setting Status: 755011

## 2024-03-18 ENCOUNTER — Ambulatory Visit: Payer: Self-pay | Admitting: Internal Medicine

## 2024-03-19 DIAGNOSIS — S86112D Strain of other muscle(s) and tendon(s) of posterior muscle group at lower leg level, left leg, subsequent encounter: Secondary | ICD-10-CM | POA: Diagnosis not present

## 2024-03-19 DIAGNOSIS — R269 Unspecified abnormalities of gait and mobility: Secondary | ICD-10-CM | POA: Diagnosis not present

## 2024-03-19 DIAGNOSIS — G6182 Multifocal motor neuropathy: Secondary | ICD-10-CM | POA: Diagnosis not present

## 2024-03-19 DIAGNOSIS — S86111S Strain of other muscle(s) and tendon(s) of posterior muscle group at lower leg level, right leg, sequela: Secondary | ICD-10-CM | POA: Diagnosis not present

## 2024-03-27 DIAGNOSIS — G6182 Multifocal motor neuropathy: Secondary | ICD-10-CM | POA: Diagnosis not present

## 2024-03-27 DIAGNOSIS — R269 Unspecified abnormalities of gait and mobility: Secondary | ICD-10-CM | POA: Diagnosis not present

## 2024-03-27 DIAGNOSIS — S86112D Strain of other muscle(s) and tendon(s) of posterior muscle group at lower leg level, left leg, subsequent encounter: Secondary | ICD-10-CM | POA: Diagnosis not present

## 2024-03-27 DIAGNOSIS — S86111S Strain of other muscle(s) and tendon(s) of posterior muscle group at lower leg level, right leg, sequela: Secondary | ICD-10-CM | POA: Diagnosis not present

## 2024-04-10 ENCOUNTER — Other Ambulatory Visit: Payer: Self-pay

## 2024-04-10 ENCOUNTER — Emergency Department (HOSPITAL_COMMUNITY)
Admission: EM | Admit: 2024-04-10 | Discharge: 2024-04-10 | Disposition: A | Discharge status: Home / Self Care | Attending: Emergency Medicine | Admitting: Emergency Medicine

## 2024-04-10 ENCOUNTER — Emergency Department (HOSPITAL_COMMUNITY)

## 2024-04-10 ENCOUNTER — Encounter (HOSPITAL_COMMUNITY): Payer: Self-pay

## 2024-04-10 DIAGNOSIS — Y92410 Unspecified street and highway as the place of occurrence of the external cause: Secondary | ICD-10-CM | POA: Insufficient documentation

## 2024-04-10 DIAGNOSIS — R519 Headache, unspecified: Secondary | ICD-10-CM | POA: Insufficient documentation

## 2024-04-10 DIAGNOSIS — E119 Type 2 diabetes mellitus without complications: Secondary | ICD-10-CM | POA: Diagnosis not present

## 2024-04-10 DIAGNOSIS — Z7984 Long term (current) use of oral hypoglycemic drugs: Secondary | ICD-10-CM | POA: Insufficient documentation

## 2024-04-10 DIAGNOSIS — M47812 Spondylosis without myelopathy or radiculopathy, cervical region: Secondary | ICD-10-CM | POA: Diagnosis not present

## 2024-04-10 DIAGNOSIS — M25511 Pain in right shoulder: Secondary | ICD-10-CM | POA: Diagnosis not present

## 2024-04-10 DIAGNOSIS — G9389 Other specified disorders of brain: Secondary | ICD-10-CM | POA: Diagnosis not present

## 2024-04-10 DIAGNOSIS — S199XXA Unspecified injury of neck, initial encounter: Secondary | ICD-10-CM | POA: Diagnosis not present

## 2024-04-10 DIAGNOSIS — I10 Essential (primary) hypertension: Secondary | ICD-10-CM | POA: Insufficient documentation

## 2024-04-10 DIAGNOSIS — H5711 Ocular pain, right eye: Secondary | ICD-10-CM | POA: Diagnosis not present

## 2024-04-10 DIAGNOSIS — Z79899 Other long term (current) drug therapy: Secondary | ICD-10-CM | POA: Insufficient documentation

## 2024-04-10 DIAGNOSIS — Z7901 Long term (current) use of anticoagulants: Secondary | ICD-10-CM | POA: Diagnosis not present

## 2024-04-10 DIAGNOSIS — S0990XA Unspecified injury of head, initial encounter: Secondary | ICD-10-CM | POA: Diagnosis not present

## 2024-04-10 DIAGNOSIS — M4801 Spinal stenosis, occipito-atlanto-axial region: Secondary | ICD-10-CM | POA: Diagnosis not present

## 2024-04-10 DIAGNOSIS — R319 Hematuria, unspecified: Secondary | ICD-10-CM | POA: Diagnosis not present

## 2024-04-10 DIAGNOSIS — M542 Cervicalgia: Secondary | ICD-10-CM | POA: Diagnosis not present

## 2024-04-10 DIAGNOSIS — Z23 Encounter for immunization: Secondary | ICD-10-CM | POA: Diagnosis not present

## 2024-04-10 DIAGNOSIS — M4802 Spinal stenosis, cervical region: Secondary | ICD-10-CM | POA: Diagnosis not present
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
## 2024-04-11 DIAGNOSIS — R319 Hematuria, unspecified: Secondary | ICD-10-CM | POA: Diagnosis not present

## 2024-04-18 NOTE — Progress Notes (Signed)
 Remote PPM Transmission

## 2024-06-12 ENCOUNTER — Ambulatory Visit: Payer: Medicare HMO | Attending: Internal Medicine

## 2024-06-12 DIAGNOSIS — I495 Sick sinus syndrome: Secondary | ICD-10-CM | POA: Diagnosis not present

## 2024-06-13 ENCOUNTER — Ambulatory Visit: Payer: Self-pay | Admitting: Internal Medicine

## 2024-06-13 LAB — CUP PACEART REMOTE DEVICE CHECK
Battery Remaining Longevity: 112 mo
Battery Voltage: 3 V
Brady Statistic AP VP Percent: 19.61 %
Brady Statistic AP VS Percent: 0 %
Brady Statistic AS VP Percent: 79.56 %
Brady Statistic AS VS Percent: 0.83 %
Brady Statistic RA Percent Paced: 20 %
Brady Statistic RV Percent Paced: 99.17 %
Date Time Interrogation Session: 20251117184432
Implantable Lead Connection Status: 753985
Implantable Lead Connection Status: 753985
Implantable Lead Implant Date: 20110428
Implantable Lead Implant Date: 20110428
Implantable Lead Location: 753859
Implantable Lead Location: 753860
Implantable Lead Model: 5076
Implantable Lead Model: 5076
Implantable Pulse Generator Implant Date: 20221123
Lead Channel Impedance Value: 323 Ohm
Lead Channel Impedance Value: 342 Ohm
Lead Channel Impedance Value: 380 Ohm
Lead Channel Impedance Value: 418 Ohm
Lead Channel Pacing Threshold Amplitude: 0.625 V
Lead Channel Pacing Threshold Amplitude: 0.625 V
Lead Channel Pacing Threshold Pulse Width: 0.4 ms
Lead Channel Pacing Threshold Pulse Width: 0.4 ms
Lead Channel Sensing Intrinsic Amplitude: 19 mV
Lead Channel Sensing Intrinsic Amplitude: 19 mV
Lead Channel Sensing Intrinsic Amplitude: 3 mV
Lead Channel Sensing Intrinsic Amplitude: 3 mV
Lead Channel Setting Pacing Amplitude: 1.5 V
Lead Channel Setting Pacing Amplitude: 2 V
Lead Channel Setting Pacing Pulse Width: 0.4 ms
Lead Channel Setting Sensing Sensitivity: 5.6 mV
Zone Setting Status: 755011
Zone Setting Status: 755011

## 2024-06-15 NOTE — Progress Notes (Signed)
 Remote PPM Transmission

## 2024-09-11 ENCOUNTER — Ambulatory Visit

## 2024-12-11 ENCOUNTER — Ambulatory Visit

## 2025-03-12 ENCOUNTER — Ambulatory Visit

## 2025-06-11 ENCOUNTER — Ambulatory Visit

## 2025-09-10 ENCOUNTER — Ambulatory Visit
